# Patient Record
Sex: Male | Born: 1975 | Race: White | Hispanic: No | Marital: Single | Smoking: Never smoker
Health system: Southern US, Community
[De-identification: ages and names within clinical notes are randomized; demographics above are authoritative.]

## PROBLEM LIST (undated history)

## (undated) DIAGNOSIS — Z789 Other specified health status: Secondary | ICD-10-CM

## (undated) DIAGNOSIS — B20 Human immunodeficiency virus [HIV] disease: Secondary | ICD-10-CM

---

## 2014-09-17 ENCOUNTER — Ambulatory Visit (INDEPENDENT_AMBULATORY_CARE_PROVIDER_SITE_OTHER): Payer: Self-pay | Admitting: Family Medicine

## 2014-09-17 VITALS — BP 92/64 | HR 110 | Temp 97.4°F | Resp 20 | Ht 68.25 in | Wt 133.4 lb

## 2014-09-17 DIAGNOSIS — R42 Dizziness and giddiness: Secondary | ICD-10-CM

## 2014-09-17 DIAGNOSIS — R1030 Lower abdominal pain, unspecified: Secondary | ICD-10-CM

## 2014-09-17 DIAGNOSIS — R197 Diarrhea, unspecified: Secondary | ICD-10-CM

## 2014-09-17 DIAGNOSIS — R112 Nausea with vomiting, unspecified: Secondary | ICD-10-CM

## 2014-09-17 LAB — POCT CBC
Granulocyte percent: 63.9 %G (ref 37–80)
HCT, POC: 52 % (ref 43.5–53.7)
Hemoglobin: 16.4 g/dL (ref 14.1–18.1)
Lymph, poc: 2.7 (ref 0.6–3.4)
MCH, POC: 25.2 pg — AB (ref 27–31.2)
MCHC: 31.6 g/dL — AB (ref 31.8–35.4)
MCV: 79.9 fL — AB (ref 80–97)
MID (cbc): 0.6 (ref 0–0.9)
MPV: 8.6 fL (ref 0–99.8)
POC Granulocyte: 6 (ref 2–6.9)
POC LYMPH PERCENT: 29.2 %L (ref 10–50)
POC MID %: 6.9 %M (ref 0–12)
Platelet Count, POC: 308 10*3/uL (ref 142–424)
RBC: 6.51 M/uL — AB (ref 4.69–6.13)
RDW, POC: 16.2 %
WBC: 9.4 10*3/uL (ref 4.6–10.2)

## 2014-09-17 LAB — POCT URINALYSIS DIPSTICK
Glucose, UA: NEGATIVE
Leukocytes, UA: NEGATIVE
Nitrite, UA: NEGATIVE
Protein, UA: 100
Spec Grav, UA: 1.02
Urobilinogen, UA: 0.2
pH, UA: 6

## 2014-09-17 MED ORDER — PROMETHAZINE HCL 12.5 MG PO TABS: 12.5000 mg | ORAL_TABLET | Freq: Three times a day (TID) | ORAL | Status: DC | PRN

## 2014-09-17 MED ORDER — OMEPRAZOLE 20 MG PO CPDR: 20.0000 mg | DELAYED_RELEASE_CAPSULE | Freq: Every day | ORAL | Status: DC

## 2014-09-17 NOTE — Progress Notes (Signed)
° °  Subjective:  This chart was scribed for Elvina SidleKurt Lauenstein, MD, by Elon SpannerGarrett Cook, Medical Scribe. This patient was seen in room Rm and the patient's care was started at 10:53 AM.   Patient ID: Jon Love, male    DOB: 12/28/1975, 39 y.o.   MRN: [id number] Chief Complaint  Patient presents with   Abdominal Pain    x 1-2 weeks   Headache   Emesis    nausea    HPI HPI Comments: Jon Dellntonio Love is a 39 y.o. male who presents to the Emergency Department complaining of lower abdominal pain onset 2 weeks ago.  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  He reports associated nausea and diarrhea onset several days ago that resolved yesterday   Review of Systems  Gastrointestinal: Positive for nausea, abdominal pain and diarrhea.       Objective:   Physical Exam Is a 39 year old gentleman appearing stated age with scaphoid abdomen in no acute distress HEENT: Mild injection of his conjunctiva, otherwise negative Chest: Clear Heart: Regular no murmur Abdomen: Active bowel sounds with no HSM or masses, minimally tender with deep palpation both lower quadrants-worse on the left Extremities: Full range of motion, no edema Skin: No rash  Results for orders placed or performed in visit on 09/17/14  POCT CBC  Result Value Ref Range   WBC 9.4 4.6 - 10.2 K/uL   Lymph, poc 2.7 0.6 - 3.4   POC LYMPH PERCENT 29.2 10 - 50 %L   MID (cbc) 0.6 0 - 0.9   POC MID % 6.9 0 - 12 %M   POC Granulocyte 6.0 2 - 6.9   Granulocyte percent 63.9 37 - 80 %G   RBC 6.51 (A) 4.69 - 6.13 M/uL   Hemoglobin 16.4 14.1 - 18.1 g/dL   HCT, POC 40.952.0 81.143.5 - 53.7 %   MCV 79.9 (A) 80 - 97 fL   MCH, POC 25.2 (A) 27 - 31.2 pg   MCHC 31.6 (A) 31.8 - 35.4 g/dL   RDW, POC 91.416.2 %   Platelet Count, POC 308 142 - 424 K/uL   MPV 8.6 0 - 99.8 fL  POCT urinalysis dipstick  Result Value Ref Range   Color, UA amber    Clarity, UA hazy    Glucose, UA neg     Bilirubin, UA mod    Ketones, UA trace    Spec Grav, UA 1.020    Blood, UA trace    pH, UA 6.0    Protein, UA 100    Urobilinogen, UA 0.2    Nitrite, UA neg    Leukocytes, UA Negative       Assessment & Plan:  11:02 AM Will order labs.  Patient acknowledges and agrees with plan.    This chart was scribed in my presence and reviewed by me personally.    ICD-9-CM ICD-10-CM   1. Nausea vomiting and diarrhea 787.91 R11.2 POCT CBC   787.01 R19.7 POCT urinalysis dipstick     omeprazole (PRILOSEC) 20 MG capsule     promethazine (PHENERGAN) 12.5 MG tablet  2. Dizziness and giddiness 780.4 R42 omeprazole (PRILOSEC) 20 MG capsule  3. Lower abdominal pain 789.09 R10.30 POCT CBC     POCT urinalysis dipstick     omeprazole (PRILOSEC) 20 MG capsule   OOW x 48 hours  Signed, Elvina SidleKurt Lauenstein, MD

## 2014-09-17 NOTE — Patient Instructions (Addendum)
It is very possible that the chemicals used initially upset your stomach and cause some of the symptoms. At this point, there is evidence for a virus causing the nausea vomiting and diarrhea. I think it's reasonable to rest for a week on clear liquids, taking the medicine as directed and then returning to work when you're at full strength    Nuseas y Vmitos (Nausea and Vomiting) La nusea es la sensacin de Dentistmalestar en el estmago o de la necesidad de vomitar. El vmito es un reflejo por el que los contenidos del estmago salen por la boca. El vmito puede ocasionar prdida de lquidos del organismo (deshidratacin). Los nios y los ONEOKadultos mayores pueden deshidratarse rpidamente (en especial si tambin tienen diarrea). Las nuseas y los vmitos son sntoma de un trastorno o enfermedad. Es importante Emergency planning/management officeraveriguar la causa de los sntomas. CAUSAS  Irritacin directa de la membrana que cubre el Hudsonestmago. Esta irritacin puede ser resultado del aumento de la produccin de cido, (reflujo gastroesofgico), infecciones, intoxicacin alimentaria, ciertos medicamentos (como antinflamatorios no esteroideos), consumo de alcohol o de tabaco.  Seales del cerebro.Estas seales pueden ser un dolor de cabeza, exposicin al calor, trastornos del odo interno, aumento de la presin en el cerebro por lesiones, infeccin, un tumor o conmocin cerebral, estmulos emocionales o problemas metablicos.  Una obstruccin en el tracto gastrointestinal (obstruccin intestinal).  Ciertas enfermedades como la diabetes, problemas en la vescula biliar, apendicitis, problemas renales, cncer, sepsis, sntomas atpicos de infarto o trastornos alimentarios.  Tratamientos mdicos como la quimioterapia y la radiacin.  Medicamentos que inducen al sueo (anestesia general) durante Cipriano Mileuna ciruga. DIAGNSTICO  El mdico podr solicitarle algunos anlisis si los problemas no mejoran luego de 26[address] Dimm[address] Roadalgunos das. Tambin podrn pedirle  anlisis si los sntomas son graves o si el motivo de los vmitos o las nuseas no est claro. Los American Electric Poweranlisis pueden ser:   Anlisis de [place].  Anlisis de Begg[place].  Pruebas de materia fecal.  Cultivos (para buscar evidencias de infeccin).  Radiografas u otros estudios por imgenes. Los Norfolk Southernresultados de las pruebas lo ayudarn al mdico a tomar decisiones acerca del mejor curso de tratamiento o la necesidad de Consecoanlisis adicionales.  TRATAMIENTO  Debe estar bien hidratado. Beba con frecuencia pequeas cantidades de lquido.Puede beber agua, bebidas deportivas, caldos claros o comer pequeos trocitos de hielo o gelatina para mantenerse hidratado.Cuando coma, hgalo lentamente para evitar las nuseas.Hay medicamentos para evitar las nuseas que pueden aliviarlo.  INSTRUCCIONES PARA EL CUIDADO DOMICILIARIO  Si su mdico le prescribe medicamentos tmelos como se le haya indicado.  Si no tiene hambre, no se fuerce a comer. Sin embargo, es necesario que tome lquidos.  Si tiene hambre alimntese con una dieta normal, a menos que el mdico le indique otra cosa.  Los mejores alimentos son Neomia Dearuna combinacin de carbohidratos complejos (arroz, trigo, papas, pan), carnes magras, yogur, frutas y Sports administratorvegetales.  Evite los alimentos ricos en grasas porque dificultan la digestin.  Beba gran cantidad de lquido para mantener la orina de tono claro o color amarillo plido.  Si est deshidratado, consulte a su mdico para que le d instrucciones especficas para volver a hidratarlo. Los signos de deshidratacin son:  Franz DellMucha sed.  Labios y boca secos.  Mareos.  Larose Kellsrina oscura.  Disminucin de la frecuencia y cantidad de la [place].  Confusin.  Tiene el pulso o la respiracin acelerados. SOLICITE ATENCIN MDICA DE INMEDIATO SI:  Vomita sangre o algo similar a la borra del caf.  La materia fecal (heces) es negra  o tiene sangre.  Sufre una cefalea grave o rigidez en el cuello.  Se siente  confundido.  Siente dolor abdominal intenso.  Tiene dolor en el pecho o dificultad para respirar.  No orina por 8 horas.  Tiene la piel fra y pegajosa.  Sigue vomitando durante ms de 24 a 48 horas.  Tiene fiebre. ASEGRESE QUE:   Comprende estas instrucciones.  Controlar su enfermedad.  Solicitar ayuda inmediatamente si no mejora o si empeora. Document Released: 06/15/2007 Document Revised: 08/18/2011 [facility] Patient Information 2015 [place], [place]. This information is not intended to replace advice given to you by your health care provider. Make sure you discuss any questions you have with your health care provider.

## 2014-10-06 ENCOUNTER — Emergency Department (HOSPITAL_COMMUNITY): Payer: Self-pay

## 2014-10-06 ENCOUNTER — Encounter (HOSPITAL_COMMUNITY): Payer: Self-pay | Admitting: Emergency Medicine

## 2014-10-06 ENCOUNTER — Inpatient Hospital Stay (HOSPITAL_COMMUNITY)
Admission: EM | Admit: 2014-10-06 | Discharge: 2014-10-13 | DRG: 417 | Disposition: A | Discharge status: Home / Self Care | Payer: Self-pay | Attending: Internal Medicine | Admitting: Internal Medicine

## 2014-10-06 DIAGNOSIS — E43 Unspecified severe protein-calorie malnutrition: Secondary | ICD-10-CM | POA: Diagnosis present

## 2014-10-06 DIAGNOSIS — I959 Hypotension, unspecified: Secondary | ICD-10-CM | POA: Diagnosis not present

## 2014-10-06 DIAGNOSIS — K8050 Calculus of bile duct without cholangitis or cholecystitis without obstruction: Secondary | ICD-10-CM

## 2014-10-06 DIAGNOSIS — N179 Acute kidney failure, unspecified: Secondary | ICD-10-CM | POA: Diagnosis present

## 2014-10-06 DIAGNOSIS — E861 Hypovolemia: Secondary | ICD-10-CM | POA: Diagnosis present

## 2014-10-06 DIAGNOSIS — E872 Acidosis, unspecified: Secondary | ICD-10-CM | POA: Diagnosis present

## 2014-10-06 DIAGNOSIS — Z419 Encounter for procedure for purposes other than remedying health state, unspecified: Secondary | ICD-10-CM

## 2014-10-06 DIAGNOSIS — M109 Gout, unspecified: Secondary | ICD-10-CM | POA: Diagnosis present

## 2014-10-06 DIAGNOSIS — E860 Dehydration: Secondary | ICD-10-CM | POA: Diagnosis present

## 2014-10-06 DIAGNOSIS — Z880 Allergy status to penicillin: Secondary | ICD-10-CM

## 2014-10-06 DIAGNOSIS — R05 Cough: Secondary | ICD-10-CM | POA: Insufficient documentation

## 2014-10-06 DIAGNOSIS — R740 Nonspecific elevation of levels of transaminase and lactic acid dehydrogenase [LDH]: Secondary | ICD-10-CM

## 2014-10-06 DIAGNOSIS — D689 Coagulation defect, unspecified: Secondary | ICD-10-CM | POA: Diagnosis present

## 2014-10-06 DIAGNOSIS — R1011 Right upper quadrant pain: Secondary | ICD-10-CM | POA: Diagnosis present

## 2014-10-06 DIAGNOSIS — R609 Edema, unspecified: Secondary | ICD-10-CM

## 2014-10-06 DIAGNOSIS — R040 Epistaxis: Secondary | ICD-10-CM | POA: Diagnosis present

## 2014-10-06 DIAGNOSIS — Z79899 Other long term (current) drug therapy: Secondary | ICD-10-CM

## 2014-10-06 DIAGNOSIS — R197 Diarrhea, unspecified: Secondary | ICD-10-CM | POA: Diagnosis present

## 2014-10-06 DIAGNOSIS — R935 Abnormal findings on diagnostic imaging of other abdominal regions, including retroperitoneum: Secondary | ICD-10-CM | POA: Diagnosis present

## 2014-10-06 DIAGNOSIS — R6521 Severe sepsis with septic shock: Secondary | ICD-10-CM | POA: Diagnosis not present

## 2014-10-06 DIAGNOSIS — E871 Hypo-osmolality and hyponatremia: Secondary | ICD-10-CM | POA: Diagnosis present

## 2014-10-06 DIAGNOSIS — E876 Hypokalemia: Secondary | ICD-10-CM | POA: Diagnosis present

## 2014-10-06 DIAGNOSIS — A419 Sepsis, unspecified organism: Secondary | ICD-10-CM

## 2014-10-06 DIAGNOSIS — R109 Unspecified abdominal pain: Secondary | ICD-10-CM

## 2014-10-06 DIAGNOSIS — B20 Human immunodeficiency virus [HIV] disease: Secondary | ICD-10-CM | POA: Diagnosis present

## 2014-10-06 DIAGNOSIS — K8067 Calculus of gallbladder and bile duct with acute and chronic cholecystitis with obstruction: Principal | ICD-10-CM | POA: Diagnosis present

## 2014-10-06 DIAGNOSIS — R7401 Elevation of levels of liver transaminase levels: Secondary | ICD-10-CM | POA: Diagnosis present

## 2014-10-06 DIAGNOSIS — R52 Pain, unspecified: Secondary | ICD-10-CM

## 2014-10-06 DIAGNOSIS — R748 Abnormal levels of other serum enzymes: Secondary | ICD-10-CM | POA: Diagnosis present

## 2014-10-06 DIAGNOSIS — K8020 Calculus of gallbladder without cholecystitis without obstruction: Secondary | ICD-10-CM | POA: Diagnosis present

## 2014-10-06 DIAGNOSIS — D649 Anemia, unspecified: Secondary | ICD-10-CM | POA: Diagnosis present

## 2014-10-06 DIAGNOSIS — R059 Cough, unspecified: Secondary | ICD-10-CM | POA: Insufficient documentation

## 2014-10-06 DIAGNOSIS — L539 Erythematous condition, unspecified: Secondary | ICD-10-CM

## 2014-10-06 DIAGNOSIS — K8309 Other cholangitis: Secondary | ICD-10-CM

## 2014-10-06 LAB — URINE MICROSCOPIC-ADD ON

## 2014-10-06 LAB — LACTIC ACID, PLASMA
Lactic Acid, Venous: 0.9 mmol/L (ref 0.5–2.0)
Lactic Acid, Venous: 1.3 mmol/L (ref 0.5–2.0)

## 2014-10-06 LAB — CBC WITH DIFFERENTIAL/PLATELET
Basophils Absolute: 0 10*3/uL (ref 0.0–0.1)
Basophils Relative: 0 % (ref 0–1)
Eosinophils Absolute: 0.4 10*3/uL (ref 0.0–0.7)
Eosinophils Relative: 7 % — ABNORMAL HIGH (ref 0–5)
HCT: 43.4 % (ref 39.0–52.0)
Hemoglobin: 15.5 g/dL (ref 13.0–17.0)
Lymphocytes Relative: 22 % (ref 12–46)
Lymphs Abs: 1.4 10*3/uL (ref 0.7–4.0)
MCH: 26.1 pg (ref 26.0–34.0)
MCHC: 35.7 g/dL (ref 30.0–36.0)
MCV: 73.1 fL — ABNORMAL LOW (ref 78.0–100.0)
Monocytes Absolute: 0.4 10*3/uL (ref 0.1–1.0)
Monocytes Relative: 6 % (ref 3–12)
Neutro Abs: 4.2 10*3/uL (ref 1.7–7.7)
Neutrophils Relative %: 65 % (ref 43–77)
Platelets: 302 10*3/uL (ref 150–400)
RBC: 5.94 MIL/uL — ABNORMAL HIGH (ref 4.22–5.81)
RDW: 14.1 % (ref 11.5–15.5)
WBC: 6.4 10*3/uL (ref 4.0–10.5)

## 2014-10-06 LAB — COMPREHENSIVE METABOLIC PANEL
ALT: 50 U/L (ref 0–53)
AST: 66 U/L — ABNORMAL HIGH (ref 0–37)
Albumin: 3.7 g/dL (ref 3.5–5.2)
Alkaline Phosphatase: 134 U/L — ABNORMAL HIGH (ref 39–117)
Anion gap: 15 (ref 5–15)
BUN: 35 mg/dL — ABNORMAL HIGH (ref 6–23)
CO2: 17 mmol/L — ABNORMAL LOW (ref 19–32)
Calcium: 8.8 mg/dL (ref 8.4–10.5)
Chloride: 98 mmol/L (ref 96–112)
Creatinine, Ser: 1.97 mg/dL — ABNORMAL HIGH (ref 0.50–1.35)
GFR calc Af Amer: 48 mL/min — ABNORMAL LOW (ref >90)
GFR calc non Af Amer: 41 mL/min — ABNORMAL LOW (ref >90)
Glucose, Bld: 154 mg/dL — ABNORMAL HIGH (ref 70–99)
Potassium: 2.5 mmol/L — CL (ref 3.5–5.1)
Sodium: 130 mmol/L — ABNORMAL LOW (ref 135–145)
Total Bilirubin: 1.8 mg/dL — ABNORMAL HIGH (ref 0.3–1.2)
Total Protein: 8.1 g/dL (ref 6.0–8.3)

## 2014-10-06 LAB — URINALYSIS, ROUTINE W REFLEX MICROSCOPIC
Bilirubin Urine: NEGATIVE
Glucose, UA: NEGATIVE mg/dL
Ketones, ur: NEGATIVE mg/dL
Leukocytes, UA: NEGATIVE
Nitrite: NEGATIVE
Protein, ur: NEGATIVE mg/dL
Specific Gravity, Urine: 1.02 (ref 1.005–1.030)
Urobilinogen, UA: 0.2 mg/dL (ref 0.0–1.0)
pH: 6 (ref 5.0–8.0)

## 2014-10-06 LAB — BASIC METABOLIC PANEL
Anion gap: 14 (ref 5–15)
BUN: 32 mg/dL — AB (ref 6–23)
CHLORIDE: 100 mmol/L (ref 96–112)
CO2: 13 mmol/L — ABNORMAL LOW (ref 19–32)
Calcium: 8.2 mg/dL — ABNORMAL LOW (ref 8.4–10.5)
Creatinine, Ser: 1.33 mg/dL (ref 0.50–1.35)
GFR calc Af Amer: 77 mL/min — ABNORMAL LOW (ref >90)
GFR calc non Af Amer: 66 mL/min — ABNORMAL LOW (ref >90)
Glucose, Bld: 149 mg/dL — ABNORMAL HIGH (ref 70–99)
POTASSIUM: 2.7 mmol/L — AB (ref 3.5–5.1)
Sodium: 127 mmol/L — ABNORMAL LOW (ref 135–145)

## 2014-10-06 LAB — LIPASE, BLOOD: Lipase: 36 U/L (ref 11–59)

## 2014-10-06 LAB — MRSA PCR SCREENING: MRSA by PCR: NEGATIVE

## 2014-10-06 MED ORDER — METRONIDAZOLE IN NACL 5-0.79 MG/ML-% IV SOLN
500.0000 mg | Freq: Once | INTRAVENOUS | Status: DC
  Filled 2014-10-06: qty 100

## 2014-10-06 MED ORDER — POTASSIUM CHLORIDE 10 MEQ/100ML IV SOLN
10.0000 meq | INTRAVENOUS | Status: AC
  Administered 2014-10-06 (×3): 10 meq via INTRAVENOUS
  Filled 2014-10-06 (×3): qty 100

## 2014-10-06 MED ORDER — THIAMINE HCL 100 MG/ML IJ SOLN
100.0000 mg | Freq: Every day | INTRAMUSCULAR | Status: DC
  Administered 2014-10-06 – 2014-10-11 (×6): 100 mg via INTRAVENOUS
  Filled 2014-10-06 (×10): qty 1

## 2014-10-06 MED ORDER — ENOXAPARIN SODIUM 40 MG/0.4ML ~~LOC~~ SOLN
40.0000 mg | SUBCUTANEOUS | Status: DC
  Administered 2014-10-06: 40 mg via SUBCUTANEOUS
  Filled 2014-10-06 (×2): qty 0.4

## 2014-10-06 MED ORDER — FENTANYL CITRATE (PF) 100 MCG/2ML IJ SOLN
50.0000 ug | Freq: Once | INTRAMUSCULAR | Status: AC
  Administered 2014-10-06: 50 ug via INTRAVENOUS
  Filled 2014-10-06: qty 2

## 2014-10-06 MED ORDER — SODIUM CHLORIDE 0.9 % IV BOLUS (SEPSIS)
1000.0000 mL | Freq: Once | INTRAVENOUS | Status: AC
  Administered 2014-10-06: 1000 mL via INTRAVENOUS

## 2014-10-06 MED ORDER — ONDANSETRON HCL 4 MG/2ML IJ SOLN: 4.0000 mg | Freq: Four times a day (QID) | INTRAMUSCULAR | Status: DC | PRN

## 2014-10-06 MED ORDER — MORPHINE SULFATE 2 MG/ML IJ SOLN
1.0000 mg | INTRAMUSCULAR | Status: DC | PRN
  Administered 2014-10-09: 1 mg via INTRAVENOUS
  Filled 2014-10-06: qty 1

## 2014-10-06 MED ORDER — ACETAMINOPHEN 650 MG RE SUPP: 650.0000 mg | Freq: Four times a day (QID) | RECTAL | Status: DC | PRN

## 2014-10-06 MED ORDER — IOHEXOL 300 MG/ML  SOLN
25.0000 mL | Freq: Once | INTRAMUSCULAR | Status: AC | PRN
  Administered 2014-10-06: 25 mL via ORAL

## 2014-10-06 MED ORDER — HYDROCODONE-ACETAMINOPHEN 5-325 MG PO TABS
1.0000 | ORAL_TABLET | ORAL | Status: DC | PRN
  Administered 2014-10-09: 1 via ORAL
  Administered 2014-10-10: 2 via ORAL
  Administered 2014-10-10: 1 via ORAL
  Administered 2014-10-10 – 2014-10-11 (×3): 2 via ORAL
  Administered 2014-10-11: 1 via ORAL
  Administered 2014-10-11 – 2014-10-12 (×4): 2 via ORAL
  Filled 2014-10-06 (×2): qty 2
  Filled 2014-10-06 (×2): qty 1
  Filled 2014-10-06 (×7): qty 2

## 2014-10-06 MED ORDER — MAGNESIUM SULFATE IN D5W 10-5 MG/ML-% IV SOLN
1.0000 g | INTRAVENOUS | Status: AC
Start: 2014-10-06 — End: 2014-10-06
  Administered 2014-10-06: 1 g via INTRAVENOUS
  Filled 2014-10-06: qty 100

## 2014-10-06 MED ORDER — FOLIC ACID 5 MG/ML IJ SOLN
1.0000 mg | Freq: Every day | INTRAMUSCULAR | Status: DC
  Administered 2014-10-06 – 2014-10-11 (×5): 1 mg via INTRAVENOUS
  Filled 2014-10-06 (×10): qty 0.2

## 2014-10-06 MED ORDER — POTASSIUM CHLORIDE 10 MEQ/100ML IV SOLN
10.0000 meq | INTRAVENOUS | Status: AC
  Administered 2014-10-06 (×2): 10 meq via INTRAVENOUS
  Filled 2014-10-06 (×3): qty 100

## 2014-10-06 MED ORDER — SODIUM CHLORIDE 0.9 % IV SOLN
INTRAVENOUS | Status: DC
  Administered 2014-10-06: 18:00:00 via INTRAVENOUS
  Administered 2014-10-06: 1000 mL via INTRAVENOUS
  Administered 2014-10-07 (×2): via INTRAVENOUS

## 2014-10-06 MED ORDER — IOHEXOL 300 MG/ML  SOLN
100.0000 mL | Freq: Once | INTRAMUSCULAR | Status: AC | PRN
  Administered 2014-10-06: 100 mL via INTRAVENOUS

## 2014-10-06 MED ORDER — ACETAMINOPHEN 325 MG PO TABS: 650.0000 mg | ORAL_TABLET | Freq: Four times a day (QID) | ORAL | Status: DC | PRN

## 2014-10-06 MED ORDER — ONDANSETRON HCL 4 MG/2ML IJ SOLN
4.0000 mg | Freq: Once | INTRAMUSCULAR | Status: AC
  Administered 2014-10-06: 4 mg via INTRAVENOUS
  Filled 2014-10-06: qty 2

## 2014-10-06 MED ORDER — CIPROFLOXACIN IN D5W 400 MG/200ML IV SOLN
400.0000 mg | Freq: Once | INTRAVENOUS | Status: AC
  Administered 2014-10-06: 400 mg via INTRAVENOUS
  Filled 2014-10-06: qty 200

## 2014-10-06 MED ORDER — METRONIDAZOLE IN NACL 5-0.79 MG/ML-% IV SOLN
500.0000 mg | Freq: Three times a day (TID) | INTRAVENOUS | Status: DC
  Administered 2014-10-06 – 2014-10-08 (×6): 500 mg via INTRAVENOUS
  Filled 2014-10-06 (×6): qty 100

## 2014-10-06 MED ORDER — ONDANSETRON HCL 4 MG PO TABS: 4.0000 mg | ORAL_TABLET | Freq: Four times a day (QID) | ORAL | Status: DC | PRN

## 2014-10-06 MED ORDER — CIPROFLOXACIN IN D5W 400 MG/200ML IV SOLN
400.0000 mg | Freq: Two times a day (BID) | INTRAVENOUS | Status: DC
  Administered 2014-10-07 – 2014-10-08 (×3): 400 mg via INTRAVENOUS
  Filled 2014-10-06 (×4): qty 200

## 2014-10-06 NOTE — ED Provider Notes (Signed)
CSN: [id number]     Arrival date & time 10/06/14  1055 History   First MD Initiated Contact with Patient 10/06/14 1113     Chief Complaint  Patient presents with  . Abdominal Pain  . Headache   Spanish interpreter phone was utilized for this history and physical exam  (Consider location/radiation/quality/duration/timing/severity/associated sxs/prior Treatment) HPI Patient presents to the emergency department with mid abdominal pain that somewhat on the right side of his abdomen.  The patient states that it has been ongoing since the beginning of April.  He states that nausea, vomiting, diarrhea.  Since that time he was seen in urgent care on April 10 and treated for gastroenteritis.  The patient states he did not get any significant relief from their medications.  The patient states that the nausea, vomiting, diarrhea get worse over the last 2 days.  Patient denies chest pain, shortness of breath, weakness, dizziness, headache, blurred vision, back pain, neck pain, cough, rhinorrhea, sore throat, fever, rash, or syncope.  The patient states that nothing seems make his condition better.  Palpation makes the pain worse History reviewed. No pertinent past medical history. History reviewed. No pertinent past surgical history. No family history on file. History  Substance Use Topics  . Smoking status: Never Smoker   . Smokeless tobacco: Not on file  . Alcohol Use: 0.0 oz/week    0 Standard drinks or equivalent per week    Review of Systems  All other systems negative except as documented in the HPI. All pertinent positives and negatives as reviewed in the HPI.  Allergies  Penicillins  Home Medications   Prior to Admission medications   Medication Sig Start Date End Date Taking? Authorizing Provider  Multiple Vitamin (MULTIVITAMIN) tablet Take 1 tablet by mouth daily.   Yes Historical Provider, MD  omeprazole (PRILOSEC) 20 MG capsule Take 1 capsule (20 mg total) by mouth daily. Patient  not taking: Reported on 10/06/2014 09/17/14   Elvina SidleKurt Lauenstein, MD  promethazine (PHENERGAN) 12.5 MG tablet Take 1 tablet (12.5 mg total) by mouth every 8 (eight) hours as needed for nausea or vomiting. Patient not taking: Reported on 10/06/2014 09/17/14   Elvina SidleKurt Lauenstein, MD   BP 98/70 mmHg  Pulse 74  Temp(Src) 97.5 F (36.4 C) (Oral)  Resp 17  SpO2 99% Physical Exam  Constitutional: He is oriented to person, place, and time. He appears well-developed and well-nourished. No distress.  HENT:  Head: Normocephalic and atraumatic.  Mouth/Throat: Oropharynx is clear and moist.  Eyes: Pupils are equal, round, and reactive to light.  Neck: Normal range of motion. Neck supple.  Cardiovascular: Normal rate, regular rhythm and normal heart sounds.  Exam reveals no gallop and no friction rub.   No murmur heard. Pulmonary/Chest: Effort normal and breath sounds normal. No respiratory distress.  Abdominal: Soft. Bowel sounds are normal. He exhibits no distension. There is tenderness. There is no rebound and no guarding.  Neurological: He is alert and oriented to person, place, and time. He exhibits normal muscle tone. Coordination normal.  Skin: Skin is warm and dry. No rash noted. No erythema.  Psychiatric: He has a normal mood and affect. His behavior is normal.  Nursing note and vitals reviewed.   ED Course  Procedures (including critical care time) Labs Review Labs Reviewed  COMPREHENSIVE METABOLIC PANEL - Abnormal; Notable for the following:    Sodium 130 (*)    Potassium 2.5 (*)    CO2 17 (*)    Glucose, Bld 154 (*)  BUN 35 (*)    Creatinine, Ser 1.97 (*)    AST 66 (*)    Alkaline Phosphatase 134 (*)    Total Bilirubin 1.8 (*)    GFR calc non Af Amer 41 (*)    GFR calc Af Amer 48 (*)    All other components within normal limits  CBC WITH DIFFERENTIAL/PLATELET - Abnormal; Notable for the following:    RBC 5.94 (*)    MCV 73.1 (*)    Eosinophils Relative 7 (*)    All other  components within normal limits  URINALYSIS, ROUTINE W REFLEX MICROSCOPIC - Abnormal; Notable for the following:    Hgb urine dipstick TRACE (*)    All other components within normal limits  URINE MICROSCOPIC-ADD ON - Abnormal; Notable for the following:    Bacteria, UA FEW (*)    All other components within normal limits  CULTURE, BLOOD (ROUTINE X 2)  CULTURE, BLOOD (ROUTINE X 2)  LIPASE, BLOOD  LACTIC ACID, PLASMA  LACTIC ACID, PLASMA    Imaging Review Ct Abdomen Pelvis W Contrast  10/06/2014   CLINICAL DATA:  Lower abdominal pain for 2 weeks. Nausea and diarrhea. Headache.  EXAM: CT ABDOMEN AND PELVIS WITH CONTRAST  TECHNIQUE: Multidetector CT imaging of the abdomen and pelvis was performed using the standard protocol following bolus administration of intravenous contrast.  CONTRAST:  OMNIPAQUE IOHEXOL 300 MG/ML  SOLN  COMPARISON:  None.  FINDINGS: Visualized lung bases are clear allowing for mild motion artifact. No pleural effusion.  There is mild, diffuse intrahepatic biliary dilatation. There is also mild extrahepatic biliary dilatation, with the common bile duct measuring 9 mm in diameter. There is suggestion of a small amount of soft tissue density in the distal aspect of the common bile duct (series 2, images 37 and 38), and a similar 10 mm focus is present dependently in the gallbladder, possibly reflecting gallstones. Gallbladder is moderately distended without gross wall thickening. Cystic duct also appears mildly to moderately distended. There is no pancreatic ductal dilatation, and no gross pancreatic or ampullary mass is identified.  3 mm hypodensity is noted in the right hepatic dome. The spleen, adrenal glands, right kidney, and pancreas have an unremarkable enhanced appearance. 3 mm hypodensity in the upper pole of the left kidney is too small to fully characterize but likely represents a cyst.  Oral contrast is present throughout the small bowel. There is no evidence of  bowel obstruction. Liquid stool is present throughout the colon, consistent with history of recent diarrhea. Appendix is identified in the right lower quadrant/ upper pelvis and is unremarkable.  Bladder is unremarkable. No free fluid or enlarged lymph nodes are identified. No acute osseous abnormality is seen.  IMPRESSION: Mild diffuse intrahepatic and extrahepatic biliary dilatation with possible choledocholithiasis and cholelithiasis. Consider further evaluation with ERCP or MRCP. Other considerations might include a biliary stricture or cholangitis.   Electronically Signed   By: Sebastian Ache   On: 10/06/2014 13:09   I spoke with gastroenterology, Dr. Elnoria Howard the [facility] and general surgery.  They have all been down to evaluate the patient.  The patient will be admitted to the hospital.  He has been given IV fluids and pain medicines here in the emergency department  MDM   Final diagnoses:  RUQ pain       Charlestine Night, PA-C 10/06/14 1623  Pricilla Loveless, MD 10/06/14 1658  Pricilla Loveless, MD 10/06/14 1739

## 2014-10-06 NOTE — ED Notes (Signed)
Paged [facility] RN-Christina [phone]

## 2014-10-06 NOTE — Progress Notes (Signed)
ANTIBIOTIC CONSULT NOTE - INITIAL  Pharmacy Consult for Cipro Indication: intra-abdominal infection  Allergies  Allergen Reactions  . Penicillins     "hives"    Patient Measurements:   Adjusted Body Weight:   Vital Signs: Temp: 97.5 F (36.4 C) (04/29 1128) Temp Source: Oral (04/29 1128) BP: 96/69 mmHg (04/29 1607) Pulse Rate: 77 (04/29 1607) Intake/Output from previous day:   Intake/Output from this shift:    Labs:  Recent Labs  10/06/14 1159  WBC 6.4  HGB 15.5  PLT 302  CREATININE 1.97*   CrCl cannot be calculated (Unknown ideal weight.). No results for input(s): VANCOTROUGH, VANCOPEAK, VANCORANDOM, GENTTROUGH, GENTPEAK, GENTRANDOM, TOBRATROUGH, TOBRAPEAK, TOBRARND, AMIKACINPEAK, AMIKACINTROU, AMIKACIN in the last 72 hours.   Microbiology: No results found for this or any previous visit (from the past 720 hour(s)).  Medical History: History reviewed. No pertinent past medical history.  Medications:   (Not in a hospital admission) Scheduled:   Infusions:  . metronidazole 500 mg (10/06/14 1654)  . potassium chloride 10 mEq (10/06/14 1652)   Assessment: 39yo male with no significant medical history presents with headache and abdominal pain. Pharmacy is consulted to dose cipro for intra-abdominal infection. Pt was started on flagyl 500mg  q8h. Pt is afebrile, WBC 6.4, sCr 1.97.  Goal of Therapy:  Eradication of infection  Plan:  Cipro 400mg  IV q12h Continue flagyl 500mg  q8h Follow up culture results, renal function, and clinical course  Arlean HoppingCorey M. Newman PiesBall, PharmD Clinical Pharmacist Pager [phone] 10/06/2014,4:51 PM

## 2014-10-06 NOTE — ED Notes (Addendum)
[phone] - Jon Love

## 2014-10-06 NOTE — Progress Notes (Addendum)
Pt came to floor from ED. Pt given CHG bath and swabbed for MRSA. Pt is placed on contact for C-Diff for a history of having diarrhea for one month prior to coming to hospital.  Pt speaks spanish and small amount of english. Called central telemetry to notify. Pt came from ED at this time.

## 2014-10-06 NOTE — ED Notes (Signed)
Potassium 2.7 Critical value notification.

## 2014-10-06 NOTE — ED Notes (Signed)
2.5 potassium. Critical lab value notifiaction. Dr. Criss Alvinegoldston notified.

## 2014-10-06 NOTE — H&P (Addendum)
[facility] History and Physical  Mouhamadou Love MRN:[id number] DOB: 06/19/1975 DOA: 10/06/2014  Referring physician: Lawyer PA.  PCP: No PCP Per Patient   Chief Complaint: diarrhea, abdominal pain.   HPI: Jon Love is a 39 y.o. male with no significant PMH who presents complaining of persistent abdominal pain, nausea, vomiting and diarrhea. He was seen at urgent care on 4-10 for the same. He was prescribe phenergan and Protonix with no improvement. He presents today due persistent and worsening abdominal pain. He report more than 7 watery BM. Also report multiple episodes of vomiting, liquid content. He relates generalized , and RUQ abdominal pain. He report pain is worse with melas. Also diarrhea get worse every time he eats.  Evaluation in the ED; k at 2.5, cr at 1.9, LFT elevated, CT abdomen: Mild diffuse intrahepatic and extrahepatic biliary dilatation with possible choledocholithiasis and cholelithiasis. Consider further evaluation with ERCP or MRCP. Other considerations might include a biliary stricture or cholangitis. He received IV bolus and IV potassium.   Review of Systems:  Negative, except as per HPI.    History reviewed. No past medical history. History reviewed. No past surgical history. Social History:  reports that he has never smoked. He does not have any smokeless tobacco history on file. He reports that he drinks alcohol. His drug history is not on file.  Allergies  Allergen Reactions  . Penicillins     "hives"    Family History; mother history of stomach cancer.   Prior to Admission medications   Medication Sig Start Date End Date Taking? Authorizing Provider  Multiple Vitamin (MULTIVITAMIN) tablet Take 1 tablet by mouth daily.   Yes Historical Provider, MD  omeprazole (PRILOSEC) 20 MG capsule Take 1 capsule (20 mg total) by mouth daily. Patient not taking: Reported on 10/06/2014 09/17/14   Kurt Lauenstein, MD  promethazine (PHENERGAN) 12.5 MG  tablet Take 1 tablet (12.5 mg total) by mouth every 8 (eight) hours as needed for nausea or vomiting. Patient not taking: Reported on 10/06/2014 09/17/14   Kurt Lauenstein, MD   Physical Exam: Filed Vitals:   10/06/14 1445 10/06/14 1500 10/06/14 1530 10/06/14 1607  BP: 98/75 98/72 98/70 96/69  Pulse: 79 85 74 77  Temp:      TempSrc:      Resp: 14 29 17 18  SpO2: 98% 97% 99% 98%    Wt Readings from Last 3 Encounters:  09/17/14 60.499 kg (133 lb 6 oz)    General:  Appears calm and comfortable Eyes: PERRL, normal lids, irises & conjunctiva ENT: grossly normal hearing, lips & tongue Neck: no LAD, masses or thyromegaly Cardiovascular: RRR, no m/r/g. No LE edema. Telemetry: SR, no arrhythmias  Respiratory: CTA bilaterally, no w/r/r. Normal respiratory effort. Abdomen: soft, right upper quadrant tenderness.  Skin: no rash or induration seen on limited exam Musculoskeletal: grossly normal tone BUE/BLE Psychiatric: grossly normal mood and affect, speech fluent and appropriate Neurologic: grossly non-focal.          Labs on Admission:  Basic Metabolic Panel:  Recent Labs Lab 10/06/14 1159  NA 130*  K 2.5*  CL 98  CO2 17*  GLUCOSE 154*  BUN 35*  CREATININE 1.97*  CALCIUM 8.8   Liver Function Tests:  Recent Labs Lab 10/06/14 1159  AST 66*  ALT 50  ALKPHOS 134*  BILITOT 1.8*  PROT 8.1  ALBUMIN 3.7    Recent Labs Lab 10/06/14 1159  LIPASE 36   No results for input(s): AMMONIA in the last   168 hours. CBC:  Recent Labs Lab 10/06/14 1159  WBC 6.4  NEUTROABS 4.2  HGB 15.5  HCT 43.4  MCV 73.1*  PLT 302   Cardiac Enzymes: No results for input(s): CKTOTAL, CKMB, CKMBINDEX, TROPONINI in the last 168 hours.  BNP (last 3 results) No results for input(s): BNP in the last 8760 hours.  ProBNP (last 3 results) No results for input(s): PROBNP in the last 8760 hours.  CBG: No results for input(s): GLUCAP in the last 168 hours.  Radiological Exams on  Admission: Ct Abdomen Pelvis W Contrast  10/06/2014   CLINICAL DATA:  Lower abdominal pain for 2 weeks. Nausea and diarrhea. Headache.  EXAM: CT ABDOMEN AND PELVIS WITH CONTRAST  TECHNIQUE: Multidetector CT imaging of the abdomen and pelvis was performed using the standard protocol following bolus administration of intravenous contrast.  CONTRAST:  164m OMNIPAQUE IOHEXOL 300 MG/ML  SOLN  COMPARISON:  None.  FINDINGS: Visualized lung bases are clear allowing for mild motion artifact. No pleural effusion.  There is mild, diffuse intrahepatic biliary dilatation. There is also mild extrahepatic biliary dilatation, with the common bile duct measuring 9 mm in diameter. There is suggestion of a small amount of soft tissue density in the distal aspect of the common bile duct (series 2, images 37 and 38), and a similar 10 mm focus is present dependently in the gallbladder, possibly reflecting gallstones. Gallbladder is moderately distended without gross wall thickening. Cystic duct also appears mildly to moderately distended. There is no pancreatic ductal dilatation, and no gross pancreatic or ampullary mass is identified.  3 mm hypodensity is noted in the right hepatic dome. The spleen, adrenal glands, right kidney, and pancreas have an unremarkable enhanced appearance. 3 mm hypodensity in the upper pole of the left kidney is too small to fully characterize but likely represents a cyst.  Oral contrast is present throughout the small bowel. There is no evidence of bowel obstruction. Liquid stool is present throughout the colon, consistent with history of recent diarrhea. Appendix is identified in the right lower quadrant/ upper pelvis and is unremarkable.  Bladder is unremarkable. No free fluid or enlarged lymph nodes are identified. No acute osseous abnormality is seen.  IMPRESSION: Mild diffuse intrahepatic and extrahepatic biliary dilatation with possible choledocholithiasis and cholelithiasis. Consider further  evaluation with ERCP or MRCP. Other considerations might include a biliary stricture or cholangitis.   Electronically Signed   By: ALogan Bores  On: 10/06/2014 13:09    EKG: I will order EKG.   Assessment/Plan Active Problems:   Hypokalemia   Acute renal failure   Abnormal transaminases   RUQ pain  1-Abdominal Pain: transaminases.  CT scan showed: intrahepatic and extra hepatic biliary dilation with possible choledocholithiasis and cholelithiasis. Others consideration includes biliary stricture or cholangitis.  GI and surgery consulted.  Continue with IV fluids. IV antibiotics to cover for intra-abdominal infection.  MRCP  Check for C diff, stool culture.   2-Hypokalemia; received 3 runs. Will repeat B-met. Careful with correction in setting of renal failure.   3-Acute Renal failure; likely related to hypovolemia, hypotension in setting of diarrhea and vomiting.  IV fluids. Strict I and O.   4-Hypotension:  In setting of dehydration. Check lactic acid. IV fluids.   5-Screening for HIV.  6-Hyponatremia, metabolic acidosis.  IV fluids for now. Hold on start bicarb gtt due to hypokalemia.   Code Status: full code.  DVT Prophylaxis:lovenox.  Family Communication: care discussed with patient.  Disposition Plan: expect 3  to 4 days inpatient.   Time spent: 35 minutes.   Regalado, Belkys A [facility] Pager [phone]   

## 2014-10-06 NOTE — ED Notes (Signed)
Dr. Sunnie Nielsenegalado notified of pt potassium, MD will order more runs of potassium.

## 2014-10-06 NOTE — ED Notes (Signed)
Per patient and translator phone, pt stated hes been having abodminal pain and nausea/vomiting. Was seen by his doctor for it, was told he had a virus. Pt states he was given medication and it is not helping. Pt is AAOX4, in NAD.

## 2014-10-06 NOTE — Consult Note (Signed)
Unassigned Consult  Reason for Consult: ? Choledocholithiasis Referring Physician: [facility] HPI: This is a 39 year old Spanish speaking male admitted for cholelithiasis and possible choledocholithiasis.  It is reported that he had nausea and vomiting.  He was initially seen by his PCP and told that he had a virus, however, his symptoms continued to persist.  He presents to the ER with these complaints.  I was able to obtain from him that his pain is minor at this time.  History reviewed. No pertinent past medical history.  History reviewed. No pertinent past surgical history.  No family history on file.  Social History:  reports that he has never smoked. He does not have any smokeless tobacco history on file. He reports that he drinks alcohol. His drug history is not on file.  Allergies:  Allergies  Allergen Reactions  . Penicillins     "hives"    Medications:  Scheduled:  Continuous: . ciprofloxacin    . metronidazole    . potassium chloride Stopped (10/06/14 1500)    Results for orders placed or performed during the hospital encounter of 10/06/14 (from the past 24 hour(s))  Comprehensive metabolic panel     Status: Abnormal   Collection Time: 10/06/14 11:59 AM  Result Value Ref Range   Sodium 130 (L) 135 - 145 mmol/L   Potassium 2.5 (LL) 3.5 - 5.1 mmol/L   Chloride 98 96 - 112 mmol/L   CO2 17 (L) 19 - 32 mmol/L   Glucose, Bld 154 (H) 70 - 99 mg/dL   BUN 35 (H) 6 - 23 mg/dL   Creatinine, Ser 1.61 (H) 0.50 - 1.35 mg/dL   Calcium 8.8 8.4 - 09.6 mg/dL   Total Protein 8.1 6.0 - 8.3 g/dL   Albumin 3.7 3.5 - 5.2 g/dL   AST 66 (H) 0 - 37 U/L   ALT 50 0 - 53 U/L   Alkaline Phosphatase 134 (H) 39 - 117 U/L   Total Bilirubin 1.8 (H) 0.3 - 1.2 mg/dL   GFR calc non Af Amer 41 (L) >90 mL/min   GFR calc Af Amer 48 (L) >90 mL/min   Anion gap 15 5 - 15  Lipase, blood     Status: None   Collection Time: 10/06/14 11:59 AM  Result Value Ref Range   Lipase 36 11 - 59 U/L  CBC with Differential     Status: Abnormal   Collection Time: 10/06/14 11:59 AM  Result Value Ref Range   WBC 6.4 4.0 - 10.5 K/uL   RBC 5.94 (H) 4.22 - 5.81 MIL/uL   Hemoglobin 15.5 13.0 - 17.0 g/dL   HCT 04.5 40.9 - 81.1 %   MCV 73.1 (L) 78.0 - 100.0 fL   MCH 26.1 26.0 - 34.0 pg   MCHC 35.7 30.0 - 36.0 g/dL   RDW 91.4 78.2 - 95.6 %   Platelets 302 150 - 400 K/uL   Neutrophils Relative % 65 43 - 77 %   Lymphocytes Relative 22 12 - 46 %   Monocytes Relative 6 3 - 12 %   Eosinophils Relative 7 (H) 0 - 5 %   Basophils Relative 0 0 - 1 %   Neutro Abs 4.2 1.7 - 7.7 K/uL   Lymphs Abs 1.4 0.7 - 4.0 K/uL   Monocytes Absolute 0.4 0.1 - 1.0 K/uL   Eosinophils Absolute 0.4 0.0 - 0.7 K/uL   Basophils Absolute 0.0 0.0 - 0.1 K/uL   Smear Review MORPHOLOGY  UNREMARKABLE   Urinalysis, Routine w reflex microscopic     Status: Abnormal   Collection Time: 10/06/14  1:05 PM  Result Value Ref Range   Color, Urine YELLOW YELLOW   APPearance CLEAR CLEAR   Specific Gravity, Urine 1.020 1.005 - 1.030   pH 6.0 5.0 - 8.0   Glucose, UA NEGATIVE NEGATIVE mg/dL   Hgb urine dipstick TRACE (A) NEGATIVE   Bilirubin Urine NEGATIVE NEGATIVE   Ketones, ur NEGATIVE NEGATIVE mg/dL   Protein, ur NEGATIVE NEGATIVE mg/dL   Urobilinogen, UA 0.2 0.0 - 1.0 mg/dL   Nitrite NEGATIVE NEGATIVE   Leukocytes, UA NEGATIVE NEGATIVE  Urine microscopic-add on     Status: Abnormal   Collection Time: 10/06/14  1:05 PM  Result Value Ref Range   Squamous Epithelial / LPF RARE RARE   WBC, UA 0-2 <3 WBC/hpf   RBC / HPF 0-2 <3 RBC/hpf   Bacteria, UA FEW (A) RARE     Ct Abdomen Pelvis W Contrast  10/06/2014   CLINICAL DATA:  Lower abdominal pain for 2 weeks. Nausea and diarrhea. Headache.  EXAM: CT ABDOMEN AND PELVIS WITH CONTRAST  TECHNIQUE: Multidetector CT imaging of the abdomen and pelvis was performed using the standard protocol following bolus administration of intravenous contrast.  CONTRAST:   OMNIPAQUE IOHEXOL 300 MG/ML  SOLN  COMPARISON:  None.  FINDINGS: Visualized lung bases are clear allowing for mild motion artifact. No pleural effusion.  There is mild, diffuse intrahepatic biliary dilatation. There is also mild extrahepatic biliary dilatation, with the common bile duct measuring 9 mm in diameter. There is suggestion of a small amount of soft tissue density in the distal aspect of the common bile duct (series 2, images 37 and 38), and a similar 10 mm focus is present dependently in the gallbladder, possibly reflecting gallstones. Gallbladder is moderately distended without gross wall thickening. Cystic duct also appears mildly to moderately distended. There is no pancreatic ductal dilatation, and no gross pancreatic or ampullary mass is identified.  3 mm hypodensity is noted in the right hepatic dome. The spleen, adrenal glands, right kidney, and pancreas have an unremarkable enhanced appearance. 3 mm hypodensity in the upper pole of the left kidney is too small to fully characterize but likely represents a cyst.  Oral contrast is present throughout the small bowel. There is no evidence of bowel obstruction. Liquid stool is present throughout the colon, consistent with history of recent diarrhea. Appendix is identified in the right lower quadrant/ upper pelvis and is unremarkable.  Bladder is unremarkable. No free fluid or enlarged lymph nodes are identified. No acute osseous abnormality is seen.  IMPRESSION: Mild diffuse intrahepatic and extrahepatic biliary dilatation with possible choledocholithiasis and cholelithiasis. Consider further evaluation with ERCP or MRCP. Other considerations might include a biliary stricture or cholangitis.   Electronically Signed   By: Sebastian Ache   On: 10/06/2014 13:09    ROS:  As stated above in the HPI otherwise negative.  Blood pressure 98/75, pulse 79, temperature 97.5 F (36.4 C), temperature source Oral, resp. rate 14, SpO2 98 %.    PE: Gen: NAD,  Alert and Oriented HEENT:  [place]/AT, EOMI Neck: Supple, no LAD Lungs: CTA Bilaterally CV: RRR without M/G/R ABM: Soft, NTND, +BS Ext: No C/C/E  Assessment/Plan: 1) Cholelithiasis. 2) ? Choledocholithiasis. 3) Abnormal CT scan. 4) Hypokalemia.   The patient may have passed a stone.  He does not have significant pain at this time and his liver enzymes are not  significantly elevated.  The TB is increased at 1.8.  I do not know the amount of pain he was experiencing before his arrival to the ER.  At this juncture, it will be best to pursue a MRCP.  Additionally, he is hypokalemic and this needs to be corrected before any general anesthetic can be administered if an ERCP is necessary.  Plan: 1) MRCP. 2) Correct potassium.  Jon Love D 10/06/2014, 3:35 PM

## 2014-10-06 NOTE — Consult Note (Signed)
[facility] Surgery Consult Note  Jon Love May 22, 1976  [id number].    Requesting MD: Dr. Regenia Skeeter Chief Complaint/Reason for Consult: Abdominal pain, N/V  HPI:  Obtained through spanish interpretor phone:  39 y/o Hispanic male present to [facility] with complaints of upper and lower abdominal pain and nausea, vomiting, diarrhea for the last 2 days, but the symptoms originated around 09/17/14.  He was seen in the [facility] urgent care secondary to lower abdominal pain and N/D.  His vomiting has since stopped on 09/20/14, but his friend brought him to the ED secondary to worsening diarrhea.   The watery diarrhea is constant, he tried to drink water, but it persists.  He denies being on any antibiotics.  He denies being exposed to anyone sick with similar symptoms.  He lives in a trailer with a friend.  The pain is a burning pain diffusely over his abdomen.  The pain has improved since arriving to the ER.  Eating makes the pain worse.  The pain is relieved by not eating.  If he does eat he throws it up and has diarrhea within 10-15 minutes.  Does admit to SOB/strugling to breathe and nose bleeds from vomiting so much.  No fever/chills, no CP, trouble urinating, dark urine.  No blood in stools/vomiting.    He notes his mother had some type of "stomach cancer" he is unsure if its stomach, gallbladder or liver.  He denies any medical problems.  He has never had any surgery.  Denies smoking.  Drinks 12-15 beers every weekend.  Denies illegal drug use.  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     ROS: All systems reviewed and otherwise negative except for as above  No family history on file.  History reviewed. No pertinent past medical history.  History reviewed. No pertinent past surgical history.  Social History:  reports that he has never smoked. He does not have any smokeless tobacco history on file. He reports that he drinks alcohol. His drug history is not on file.  Allergies:  Allergies   Allergen Reactions  . Penicillins     "hives"     (Not in a hospital admission)  Blood pressure 98/70, pulse 74, temperature 97.5 F (36.4 C), temperature source Oral, resp. rate 17, SpO2 99 %. Physical Exam: General: pleasant, WD/WN Hispanic male who is laying in bed in NAD HEENT: head is normocephalic, atraumatic.  Sclera are injected, with terigyium b/l, no scleral icterus.  PERRL.  Ears and nose without any masses or lesions.  Mouth is pink and moist Heart: regular, rate, and rhythm.  No obvious murmurs, gallops, or rubs noted.  Palpable pedal pulses bilaterally Lungs: CTAB, no wheezes, rhonchi, or rales noted.  Respiratory effort nonlabored Abd: soft, ND, moderately tender in the epigastrium and mild diffusely, +BS, no masses, hernias, or organomegaly, no abdominal scars noted MS: all 4 extremities are symmetrical with no cyanosis, clubbing, or edema. Skin: warm and dry with no masses, lesions, or rashes Psych: A&Ox3 with an appropriate affect.   Results for orders placed or performed during the hospital encounter of 10/06/14 (from the past 48 hour(s))  Comprehensive metabolic panel     Status: Abnormal   Collection Time: 10/06/14 11:59 AM  Result Value Ref Range   Sodium 130 (L) 135 - 145 mmol/L   Potassium 2.5 (LL) 3.5 - 5.1 mmol/L    Comment: CRITICAL RESULT CALLED TO, READ BACK BY AND VERIFIED WITH: GOSS C RN 10/06/14 1314 COSTELLO B REPEATED TO VERIFY  Chloride 98 96 - 112 mmol/L   CO2 17 (L) 19 - 32 mmol/L   Glucose, Bld 154 (H) 70 - 99 mg/dL   BUN 35 (H) 6 - 23 mg/dL   Creatinine, Ser 1.97 (H) 0.50 - 1.35 mg/dL   Calcium 8.8 8.4 - 10.5 mg/dL   Total Protein 8.1 6.0 - 8.3 g/dL   Albumin 3.7 3.5 - 5.2 g/dL   AST 66 (H) 0 - 37 U/L   ALT 50 0 - 53 U/L   Alkaline Phosphatase 134 (H) 39 - 117 U/L   Total Bilirubin 1.8 (H) 0.3 - 1.2 mg/dL   GFR calc non Af Amer 41 (L) >90 mL/min   GFR calc Af Amer 48 (L) >90 mL/min    Comment: (NOTE) The eGFR has been calculated  using the CKD EPI equation. This calculation has not been validated in all clinical situations. eGFR's persistently <90 mL/min signify possible Chronic Kidney Disease.    Anion gap 15 5 - 15  Lipase, blood     Status: None   Collection Time: 10/06/14 11:59 AM  Result Value Ref Range   Lipase 36 11 - 59 U/L  CBC with Differential     Status: Abnormal   Collection Time: 10/06/14 11:59 AM  Result Value Ref Range   WBC 6.4 4.0 - 10.5 K/uL   RBC 5.94 (H) 4.22 - 5.81 MIL/uL   Hemoglobin 15.5 13.0 - 17.0 g/dL   HCT 43.4 39.0 - 52.0 %   MCV 73.1 (L) 78.0 - 100.0 fL   MCH 26.1 26.0 - 34.0 pg   MCHC 35.7 30.0 - 36.0 g/dL   RDW 14.1 11.5 - 15.5 %   Platelets 302 150 - 400 K/uL   Neutrophils Relative % 65 43 - 77 %   Lymphocytes Relative 22 12 - 46 %   Monocytes Relative 6 3 - 12 %   Eosinophils Relative 7 (H) 0 - 5 %   Basophils Relative 0 0 - 1 %   Neutro Abs 4.2 1.7 - 7.7 K/uL   Lymphs Abs 1.4 0.7 - 4.0 K/uL   Monocytes Absolute 0.4 0.1 - 1.0 K/uL   Eosinophils Absolute 0.4 0.0 - 0.7 K/uL   Basophils Absolute 0.0 0.0 - 0.1 K/uL   Smear Review MORPHOLOGY UNREMARKABLE   Urinalysis, Routine w reflex microscopic     Status: Abnormal   Collection Time: 10/06/14  1:05 PM  Result Value Ref Range   Color, Urine YELLOW YELLOW   APPearance CLEAR CLEAR   Specific Gravity, Urine 1.020 1.005 - 1.030   pH 6.0 5.0 - 8.0   Glucose, UA NEGATIVE NEGATIVE mg/dL   Hgb urine dipstick TRACE (A) NEGATIVE   Bilirubin Urine NEGATIVE NEGATIVE   Ketones, ur NEGATIVE NEGATIVE mg/dL   Protein, ur NEGATIVE NEGATIVE mg/dL   Urobilinogen, UA 0.2 0.0 - 1.0 mg/dL   Nitrite NEGATIVE NEGATIVE   Leukocytes, UA NEGATIVE NEGATIVE  Urine microscopic-add on     Status: Abnormal   Collection Time: 10/06/14  1:05 PM  Result Value Ref Range   Squamous Epithelial / LPF RARE RARE   WBC, UA 0-2 <3 WBC/hpf   RBC / HPF 0-2 <3 RBC/hpf   Bacteria, UA FEW (A) RARE   Ct Abdomen Pelvis W Contrast  10/06/2014   CLINICAL  DATA:  Lower abdominal pain for 2 weeks. Nausea and diarrhea. Headache.  EXAM: CT ABDOMEN AND PELVIS WITH CONTRAST  TECHNIQUE: Multidetector CT imaging of the abdomen and pelvis was performed  using the standard protocol following bolus administration of intravenous contrast.  CONTRAST:  129m OMNIPAQUE IOHEXOL 300 MG/ML  SOLN  COMPARISON:  None.  FINDINGS: Visualized lung bases are clear allowing for mild motion artifact. No pleural effusion.  There is mild, diffuse intrahepatic biliary dilatation. There is also mild extrahepatic biliary dilatation, with the common bile duct measuring 9 mm in diameter. There is suggestion of a small amount of soft tissue density in the distal aspect of the common bile duct (series 2, images 37 and 38), and a similar 10 mm focus is present dependently in the gallbladder, possibly reflecting gallstones. Gallbladder is moderately distended without gross wall thickening. Cystic duct also appears mildly to moderately distended. There is no pancreatic ductal dilatation, and no gross pancreatic or ampullary mass is identified.  3 mm hypodensity is noted in the right hepatic dome. The spleen, adrenal glands, right kidney, and pancreas have an unremarkable enhanced appearance. 3 mm hypodensity in the upper pole of the left kidney is too small to fully characterize but likely represents a cyst.  Oral contrast is present throughout the small bowel. There is no evidence of bowel obstruction. Liquid stool is present throughout the colon, consistent with history of recent diarrhea. Appendix is identified in the right lower quadrant/ upper pelvis and is unremarkable.  Bladder is unremarkable. No free fluid or enlarged lymph nodes are identified. No acute osseous abnormality is seen.  IMPRESSION: Mild diffuse intrahepatic and extrahepatic biliary dilatation with possible choledocholithiasis and cholelithiasis. Consider further evaluation with ERCP or MRCP. Other considerations might include a  biliary stricture or cholangitis.   Electronically Signed   By: ALogan Bores  On: 10/06/2014 13:09      Assessment/Plan Nausea/vomting Diarrhea -Needs to be checked for c.diff Epigastric and diffuse abdominal pain ?Cholelithiasis and/or choledocholithiasis Intrahepatic/extrahepatic biliary duct dilatation ? Biliary stricture or cholangitis -Admitted to medicine, GI following -MRCP pending, await results, may need ultrasound -NPO, bowel rest, IVF, pain control, antiemetics -Needs to be further worked up by GI.  He does not have acute cholecystitis at this time.  May not require lap chole this admission.     DCoralie Keens PMethodist [facility] 10/06/2014, 3:53 PM Pager: [phone]  Will allow GI to complete their work-up.  No acute surgical indications.  Call [place] if any surgical indications arise.  MImogene Burn TGeorgette Dover MD, FLat[facility] HospitalSur[facility]  General/ Trauma Surgery  10/06/2014 4:30 PM

## 2014-10-07 ENCOUNTER — Inpatient Hospital Stay (HOSPITAL_COMMUNITY): Payer: MEDICAID

## 2014-10-07 ENCOUNTER — Inpatient Hospital Stay (HOSPITAL_COMMUNITY): Payer: Self-pay

## 2014-10-07 DIAGNOSIS — E871 Hypo-osmolality and hyponatremia: Secondary | ICD-10-CM | POA: Diagnosis present

## 2014-10-07 DIAGNOSIS — E43 Unspecified severe protein-calorie malnutrition: Secondary | ICD-10-CM | POA: Diagnosis present

## 2014-10-07 DIAGNOSIS — E872 Acidosis, unspecified: Secondary | ICD-10-CM | POA: Diagnosis present

## 2014-10-07 DIAGNOSIS — E876 Hypokalemia: Secondary | ICD-10-CM

## 2014-10-07 DIAGNOSIS — R935 Abnormal findings on diagnostic imaging of other abdominal regions, including retroperitoneum: Secondary | ICD-10-CM

## 2014-10-07 DIAGNOSIS — K8020 Calculus of gallbladder without cholecystitis without obstruction: Secondary | ICD-10-CM | POA: Diagnosis present

## 2014-10-07 DIAGNOSIS — R197 Diarrhea, unspecified: Secondary | ICD-10-CM

## 2014-10-07 DIAGNOSIS — K8050 Calculus of bile duct without cholangitis or cholecystitis without obstruction: Secondary | ICD-10-CM | POA: Diagnosis present

## 2014-10-07 LAB — COMPREHENSIVE METABOLIC PANEL
ALT: 35 U/L (ref 0–53)
AST: 47 U/L — ABNORMAL HIGH (ref 0–37)
Albumin: 2.5 g/dL — ABNORMAL LOW (ref 3.5–5.2)
Alkaline Phosphatase: 89 U/L (ref 39–117)
Anion gap: 11 (ref 5–15)
BUN: 26 mg/dL — AB (ref 6–23)
CALCIUM: 8 mg/dL — AB (ref 8.4–10.5)
CO2: 12 mmol/L — ABNORMAL LOW (ref 19–32)
CREATININE: 1.14 mg/dL (ref 0.50–1.35)
Chloride: 112 mmol/L (ref 96–112)
GFR, EST NON AFRICAN AMERICAN: 80 mL/min — AB (ref >90)
GLUCOSE: 80 mg/dL (ref 70–99)
POTASSIUM: 3.3 mmol/L — AB (ref 3.5–5.1)
Sodium: 135 mmol/L (ref 135–145)
Total Bilirubin: 1 mg/dL (ref 0.3–1.2)
Total Protein: 5.5 g/dL — ABNORMAL LOW (ref 6.0–8.3)

## 2014-10-07 LAB — BASIC METABOLIC PANEL
ANION GAP: 11 (ref 5–15)
BUN: 27 mg/dL — ABNORMAL HIGH (ref 6–23)
CALCIUM: 7.9 mg/dL — AB (ref 8.4–10.5)
CHLORIDE: 110 mmol/L (ref 96–112)
CO2: 14 mmol/L — AB (ref 19–32)
Creatinine, Ser: 1.17 mg/dL (ref 0.50–1.35)
GFR calc Af Amer: 90 mL/min — ABNORMAL LOW (ref >90)
GFR calc non Af Amer: 78 mL/min — ABNORMAL LOW (ref >90)
Glucose, Bld: 84 mg/dL (ref 70–99)
POTASSIUM: 3.1 mmol/L — AB (ref 3.5–5.1)
SODIUM: 135 mmol/L (ref 135–145)

## 2014-10-07 LAB — MAGNESIUM: Magnesium: 1.8 mg/dL (ref 1.5–2.5)

## 2014-10-07 LAB — CBC
HCT: 31.6 % — ABNORMAL LOW (ref 39.0–52.0)
HEMOGLOBIN: 11.3 g/dL — AB (ref 13.0–17.0)
MCH: 25.8 pg — ABNORMAL LOW (ref 26.0–34.0)
MCHC: 35.8 g/dL (ref 30.0–36.0)
MCV: 72.1 fL — ABNORMAL LOW (ref 78.0–100.0)
Platelets: 192 10*3/uL (ref 150–400)
RBC: 4.38 MIL/uL (ref 4.22–5.81)
RDW: 14.3 % (ref 11.5–15.5)
WBC: 5 10*3/uL (ref 4.0–10.5)

## 2014-10-07 LAB — HIV 1/2 AB DIFFERENTIATION
HIV 1 Ab: REACTIVE — AB
HIV 2 Ab: NONREACTIVE

## 2014-10-07 LAB — HIV ANTIBODY (ROUTINE TESTING W REFLEX): HIV Screen 4th Generation wRfx: REACTIVE — AB

## 2014-10-07 MED ORDER — BOOST / RESOURCE BREEZE PO LIQD
1.0000 | Freq: Three times a day (TID) | ORAL | Status: DC
  Administered 2014-10-09 – 2014-10-13 (×9): 1 via ORAL

## 2014-10-07 MED ORDER — POTASSIUM CHLORIDE 10 MEQ/100ML IV SOLN
10.0000 meq | INTRAVENOUS | Status: AC
  Administered 2014-10-07 (×2): 10 meq via INTRAVENOUS
  Filled 2014-10-07 (×2): qty 100

## 2014-10-07 MED ORDER — GADOBENATE DIMEGLUMINE 529 MG/ML IV SOLN: 12.0000 mL | Freq: Once | INTRAVENOUS | Status: AC | PRN

## 2014-10-07 MED ORDER — SODIUM CHLORIDE 0.9 % IV BOLUS (SEPSIS): 250.0000 mL | Freq: Once | INTRAVENOUS | Status: DC

## 2014-10-07 NOTE — Progress Notes (Signed)
Progress Note Covering for Dr. Elnoria Howard   Subjective     Objective   Vital signs in last 24 hours: Temp:  [97.5 F (36.4 C)-97.9 F (36.6 C)] 97.6 F (36.4 C) (04/30 0948) Pulse Rate:  [65-85] 66 (04/30 0948) Resp:  [11-29] 11 (04/30 0948) BP: (86-115)/(52-76) 87/52 mmHg (04/30 0948) SpO2:  [92 %-100 %] 92 % (04/30 0948) Weight:  [123 lb 10.9 oz (56.1 kg)] 123 lb 10.9 oz (56.1 kg) (04/29 1849)   General:    Hispanic male in NAD Heart:  Regular rate and rhythm; no murmurs Lungs: Respirations even and unlabored, lungs CTA bilaterally Abdomen:  Soft, nontender and nondistended. Normal bowel sounds. Extremities:  Without edema. Neurologic:  Alert and oriented,  grossly normal neurologically. Psych:  Cooperative. Normal mood and affect.  Lab Results:  Recent Labs  10/06/14 1159 10/07/14 0302  WBC 6.4 5.0  HGB 15.5 11.3*  HCT 43.4 31.6*  PLT 302 192   BMET  Recent Labs  10/06/14 1700 10/07/14 0015 10/07/14 0302  NA 127* 135 135  K 2.7* 3.1* 3.3*  CL 100 110 112  CO2 13* 14* 12*  GLUCOSE 149* 84 80  BUN 32* 27* 26*  CREATININE 1.33 1.17 1.14  CALCIUM 8.2* 7.9* 8.0*   LFT  Recent Labs  10/07/14 0302  PROT 5.5*  ALBUMIN 2.5*  AST 47*  ALT 35  ALKPHOS 89  BILITOT 1.0   Studies/Results: Ct Abdomen Pelvis W Contrast  10/06/2014   CLINICAL DATA:  Lower abdominal pain for 2 weeks. Nausea and diarrhea. Headache.  EXAM: CT ABDOMEN AND PELVIS WITH CONTRAST  TECHNIQUE: Multidetector CT imaging of the abdomen and pelvis was performed using the standard protocol following bolus administration of intravenous contrast.  CONTRAST:  OMNIPAQUE IOHEXOL 300 MG/ML  SOLN  COMPARISON:  None.  FINDINGS: Visualized lung bases are clear allowing for mild motion artifact. No pleural effusion.  There is mild, diffuse intrahepatic biliary dilatation. There is also mild extrahepatic biliary dilatation, with the common bile duct measuring 9 mm in diameter. There is suggestion of  a small amount of soft tissue density in the distal aspect of the common bile duct (series 2, images 37 and 38), and a similar 10 mm focus is present dependently in the gallbladder, possibly reflecting gallstones. Gallbladder is moderately distended without gross wall thickening. Cystic duct also appears mildly to moderately distended. There is no pancreatic ductal dilatation, and no gross pancreatic or ampullary mass is identified.  3 mm hypodensity is noted in the right hepatic dome. The spleen, adrenal glands, right kidney, and pancreas have an unremarkable enhanced appearance. 3 mm hypodensity in the upper pole of the left kidney is too small to fully characterize but likely represents a cyst.  Oral contrast is present throughout the small bowel. There is no evidence of bowel obstruction. Liquid stool is present throughout the colon, consistent with history of recent diarrhea. Appendix is identified in the right lower quadrant/ upper pelvis and is unremarkable.  Bladder is unremarkable. No free fluid or enlarged lymph nodes are identified. No acute osseous abnormality is seen.  IMPRESSION: Mild diffuse intrahepatic and extrahepatic biliary dilatation with possible choledocholithiasis and cholelithiasis. Consider further evaluation with ERCP or MRCP. Other considerations might include a biliary stricture or cholangitis.   Electronically Signed   By: Sebastian Ache   On: 10/06/2014 13:09   Mr 3d Recon At Scanner  10/07/2014   CLINICAL DATA:  Right upper quadrant abdominal pain. Nausea and  vomiting, and diarrhea. Biliary dilatation and possible choledocholithiasis seen on recent CT.  EXAM: MRI ABDOMEN WITHOUT AND WITH CONTRAST (INCLUDING MRCP)  TECHNIQUE: Multiplanar multisequence MR imaging of the abdomen was performed both before and after the administration of intravenous contrast. Heavily T2-weighted images of the biliary and pancreatic ducts were obtained, and three-dimensional MRCP images were rendered by  post processing.  CONTRAST:  12 mL MultiHance  COMPARISON:  CT on 10/06/2014  FINDINGS: Lower chest:  Unremarkable.  Hepatobiliary: No liver masses are identified. Gallbladder is distended and contains several small gallstones, however there is no evidence of gallbladder wall thickening or pericholecystic inflammatory changes.  Mild diffuse biliary ductal dilatation is demonstrated with common bile duct measuring 8 mm. Two or 3 small less than 1 cm calculi are seen in the distal common bile duct.  Pancreas: No masses, inflammatory changes, or fluid collections demonstrated. No evidence pancreatic ductal dilatation or pancreas divisum.  Spleen:  Within normal limits in size and appearance.  Adrenal Glands:  No masses identified.  Kidneys: No renal masses identified. No evidence of hydronephrosis. Tiny sub-cm cyst is seen in the anterior upper pole of the left kidney.  Stomach/Bowel/Peritoneum: No evidence of wall thickening, mass, or obstruction involving visualized abdominal bowel.  Vascular/Lymphatic: No pathologically enlarged lymph nodes identified. No other significant abnormality noted.  Other:  None.  Musculoskeletal:  No suspicious bone lesions identified.  IMPRESSION: Cholelithiasis, without definite radiographic signs of acute cholecystitis.  Diffuse biliary ductal dilatation secondary to choledocholithiasis; 2 or 3 small less than 1 cm calculi in the distal common bile duct.   Electronically Signed   By: Myles Rosenthal M.D.   On: 10/07/2014 09:29   Mr Abd W/wo Cm/mrcp  10/07/2014   CLINICAL DATA:  Right upper quadrant abdominal pain. Nausea and vomiting, and diarrhea. Biliary dilatation and possible choledocholithiasis seen on recent CT.  EXAM: MRI ABDOMEN WITHOUT AND WITH CONTRAST (INCLUDING MRCP)  TECHNIQUE: Multiplanar multisequence MR imaging of the abdomen was performed both before and after the administration of intravenous contrast. Heavily T2-weighted images of the biliary and pancreatic ducts were  obtained, and three-dimensional MRCP images were rendered by post processing.  CONTRAST:  12 mL MultiHance  COMPARISON:  CT on 10/06/2014  FINDINGS: Lower chest:  Unremarkable.  Hepatobiliary: No liver masses are identified. Gallbladder is distended and contains several small gallstones, however there is no evidence of gallbladder wall thickening or pericholecystic inflammatory changes.  Mild diffuse biliary ductal dilatation is demonstrated with common bile duct measuring 8 mm. Two or 3 small less than 1 cm calculi are seen in the distal common bile duct.  Pancreas: No masses, inflammatory changes, or fluid collections demonstrated. No evidence pancreatic ductal dilatation or pancreas divisum.  Spleen:  Within normal limits in size and appearance.  Adrenal Glands:  No masses identified.  Kidneys: No renal masses identified. No evidence of hydronephrosis. Tiny sub-cm cyst is seen in the anterior upper pole of the left kidney.  Stomach/Bowel/Peritoneum: No evidence of wall thickening, mass, or obstruction involving visualized abdominal bowel.  Vascular/Lymphatic: No pathologically enlarged lymph nodes identified. No other significant abnormality noted.  Other:  None.  Musculoskeletal:  No suspicious bone lesions identified.  IMPRESSION: Cholelithiasis, without definite radiographic signs of acute cholecystitis.  Diffuse biliary ductal dilatation secondary to choledocholithiasis; 2 or 3 small less than 1 cm calculi in the distal common bile duct.   Electronically Signed   By: Myles Rosenthal M.D.   On: 10/07/2014 09:29  Assessment / Plan:   341. 39 year old male with abdominal pain, upper and lower with nausea, vomiting and diarrhea.. Liquid stool in bowel on CTscan, no inflammatory changes of bowel. C-diff pending.   2. Cholelithiasis / choledocholithiasis. LFTs normal except for mildly elevated AST but MRCP demonstrates small distal CBD stones. Patient will need ERCP with stone extraction tomorrow. Will  discontinue Lovenox, he has a dose due tonight at 8pm.   3. Microcytosis, not anemic except for today's lab with could be from IVF dilution.  4. Hypokalemia, improved after K+ runs. Now at 3.3. Check K+ in am to be sure stable as patient for ERCP tomorrow.    LOS: 1 day   Jon Love  10/07/2014, 12:01 PM   GI Attending Note  I have personally taken an interval history, reviewed the chart, and examined the patient. MRCP clearly demonstrates CBD stones.  Plan ERCP in am.  Barbette Hairobert D. Arlyce DiceKaplan, MD, [facility] Hospital[facility]ACG [facility] [phone]

## 2014-10-07 NOTE — Progress Notes (Signed)
Initial Nutrition Assessment  DOCUMENTATION CODES:  Severe malnutrition in context of acute illness/injury  INTERVENTION: Recommend monitoring magnesium, potassium, and phosphorus daily for at least 3 days, MD to replete as needed, as pt is at risk for refeeding syndrome given inability to keep food/beverages down for > 7 days.  Diet advancement per MD  Add supplements when diet is advanced: Boost Breeze, [employer]ure Enlive (each supplement provides 350kcal and 20 grams of protein)  NUTRITION DIAGNOSIS:  Malnutrition related to vomiting as evidenced by severe depletion of muscle mass, percent weight loss.   GOAL:  Patient will meet greater than or equal to 90% of their needs   MONITOR:  PO intake, Supplement acceptance, Diet advancement, Labs, I & O's  REASON FOR ASSESSMENT:  Malnutrition Screening Tool    ASSESSMENT: 39 yo Spanish speaking male presents with diarrhea and abdominal pain. Found to have cholelithiasis, biliary ductal dilatation concerning for choledocholithiasis.   [employer] id [phone] used to communicate with patient. Pt reports that he as been able to eat but, unable to keep any food down for the past month. Pt thinks he has lost 5 or 6 lbs in the past couple weeks; he is unsure of usual body weight. Weight history shows pt has lost 10 lbs in the past 20 days- 7.5% weight loss is severe for time frame. Pt states that before developing nausea and vomiting he had a great appetite and was eating very well. Pt is now underweight and per physical exam has severe muscle wasting. RD discussed the importance of protein intake when diet is advanced; pt agreeable to receiving nutritional supplements  Labs and medications reviewed. Low potassium, low calcium, low hemoglobin  Height:  Ht Readings from Last 1 Encounters:  10/06/14 5\' 11"  (1.803 m)    Weight:  Wt Readings from Last 1 Encounters:  10/06/14 123 lb 10.9 oz (56.1 kg)    Ideal Body Weight:  78  kg  Wt Readings from Last 10 Encounters:  10/06/14 123 lb 10.9 oz (56.1 kg)  09/17/14 133 lb 6 oz (60.499 kg)    BMI:  Body mass index is 17.26 kg/(m^2).  Estimated Nutritional Needs:  Kcal:  2000-2200  Protein:  70-80 grams  Fluid:  >/=1.7 L/day  Skin:  Reviewed, no issues  Diet Order:  Diet NPO time specified Except for: Ice Chips  EDUCATION NEEDS:  No education needs identified at this time   Intake/Output Summary (Last 24 hours) at 10/07/14 1102 Last data filed at 10/07/14 0113  Gross per 24 hour  Intake 1845.83 ml  Output   1025 ml  Net 820.83 ml    Last BM:  PTA   Ian Malkineanne Barnett RD, LDN Inpatient Clinical Dietitian Pager: [phone] After Hours Pager: [phone]

## 2014-10-07 NOTE — Progress Notes (Signed)
[facility] PROGRESS NOTE  Jon Love WJX:[id number] DOB: [birth year] DOA: 10/06/2014 PCP: No PCP Per Patient   summary: 39 yo Spanish speaking male presents with diarrhea and abdominal pain. Found to have cholelithiasis, biliary ductal dilatation concerning for choledocholithiasis. Surgery and GI consultation in the emergency room. Stool studies ordered, MRCP ordered and empiric antibiotics ordered.  Assessment/Plan: Abnormal CT of the abdomen with biliary ductal dilitation:  MRCP pending. Symptoms much better. If MRCP looks okay, will start clears. Transfer to MedSurg. Active Problems:   Hypokalemia secondary to diarrhea: improving. Still slightly low. Will replete IV and check a magnesium level.   Acute renal failure:  Improved. Secondary to prerenal azotemia. Continue IV fluids.   Abnormal transaminases, mild. Will check acute hepatitis panel.   RUQ pain improving   Metabolic acidosis secondary to diarrhea. Lactate normal. Monitor.   Diarrhea: Per patient, less voluminous. Stool studies have not yet been collected. I've discussed with nursing staff.   Hyponatremia, hypovolemic, resolved with IV hydration   Cholelithiasis:  Surgery following.   Code Status:  full Family Communication:   Disposition Plan:  Home once medically stable. Transfer to MedSurg today.  Consultants:  GI: Hung  Gen. surgery  Procedures:     Antibiotics:  Cipro, Flagyl 4/29  HPI/Subjective: Feels slightly better. Pain less intense. No vomiting. Feels hungry. Diarrhea less frequent and less voluminous.  Objective: Filed Vitals:   10/07/14 0100  BP: 91/63  Pulse: 65  Temp:   Resp: 11    Intake/Output Summary (Last 24 hours) at 10/07/14 0835 Last data filed at 10/07/14 0113  Gross per 24 hour  Intake 1845.83 ml  Output   1025 ml  Net 820.83 ml   Filed Weights   10/06/14 1849  Weight: 56.1 kg (123 lb 10.9 oz)    Exam:   General:  Nontoxic appearing. Pleasant  cooperative.  Cardiovascular: Regular rate rhythm without murmurs gallops rubs  Respiratory: Clear to auscultation bilaterally without wheezes rhonchi or rales  Abdomen: Soft nontender nondistended  Ext: No clubbing cyanosis or edema  Basic Metabolic Panel:  Recent Labs Lab 10/06/14 1159 10/06/14 1700 10/07/14 0015 10/07/14 0302  NA 130* 127* 135 135  K 2.5* 2.7* 3.1* 3.3*  CL 98 100 110 112  CO2 17* 13* 14* 12*  GLUCOSE 154* 149* 84 80  BUN 35* 32* 27* 26*  CREATININE 1.97* 1.33 1.17 1.14  CALCIUM 8.8 8.2* 7.9* 8.0*   Liver Function Tests:  Recent Labs Lab 10/06/14 1159 10/07/14 0302  AST 66* 47*  ALT 50 35  ALKPHOS 134* 89  BILITOT 1.8* 1.0  PROT 8.1 5.5*  ALBUMIN 3.7 2.5*    Recent Labs Lab 10/06/14 1159  LIPASE 36   No results for input(s): AMMONIA in the last 168 hours. CBC:  Recent Labs Lab 10/06/14 1159 10/07/14 0302  WBC 6.4 5.0  NEUTROABS 4.2  --   HGB 15.5 11.3*  HCT 43.4 31.6*  MCV 73.1* 72.1*  PLT 302 192   Cardiac Enzymes: No results for input(s): CKTOTAL, CKMB, CKMBINDEX, TROPONINI in the last 168 hours. BNP (last 3 results) No results for input(s): BNP in the last 8760 hours.  ProBNP (last 3 results) No results for input(s): PROBNP in the last 8760 hours.  CBG: No results for input(s): GLUCAP in the last 168 hours.  Recent Results (from the past 240 hour(s))  Culture, blood (routine x 2)     Status: None (Preliminary result)   Collection Time: 10/06/14  3:39 PM  Result Value Ref Range Status   Specimen Description BLOOD RIGHT WRIST  Final   Special Requests BOTTLES DRAWN AEROBIC AND ANAEROBIC 5CC  Final   Culture   Final           BLOOD CULTURE RECEIVED NO GROWTH TO DATE CULTURE WILL BE HELD FOR 5 DAYS BEFORE ISSUING A FINAL NEGATIVE REPORT Performed at [facility]    Report Status PENDING  Incomplete  Culture, blood (routine x 2)     Status: None (Preliminary result)   Collection Time: 10/06/14  3:43 PM   Result Value Ref Range Status   Specimen Description BLOOD RIGHT ARM  Final   Special Requests BOTTLES DRAWN AEROBIC AND ANAEROBIC 5CC  Final   Culture   Final           BLOOD CULTURE RECEIVED NO GROWTH TO DATE CULTURE WILL BE HELD FOR 5 DAYS BEFORE ISSUING A FINAL NEGATIVE REPORT Performed at [facility]    Report Status PENDING  Incomplete  MRSA PCR Screening     Status: None   Collection Time: 10/06/14  6:58 PM  Result Value Ref Range Status   MRSA by PCR NEGATIVE NEGATIVE Final    Comment:        The GeneXpert MRSA Assay (FDA approved for NASAL specimens only), is one component of a comprehensive MRSA colonization surveillance program. It is not intended to diagnose MRSA infection nor to guide or monitor treatment for MRSA infections.      Studies: Ct Abdomen Pelvis W Contrast  10/06/2014   CLINICAL DATA:  Lower abdominal pain for 2 weeks. Nausea and diarrhea. Headache.  EXAM: CT ABDOMEN AND PELVIS WITH CONTRAST  TECHNIQUE: Multidetector CT imaging of the abdomen and pelvis was performed using the standard protocol following bolus administration of intravenous contrast.  CONTRAST:  OMNIPAQUE IOHEXOL 300 MG/ML  SOLN  COMPARISON:  None.  FINDINGS: Visualized lung bases are clear allowing for mild motion artifact. No pleural effusion.  There is mild, diffuse intrahepatic biliary dilatation. There is also mild extrahepatic biliary dilatation, with the common bile duct measuring 9 mm in diameter. There is suggestion of a small amount of soft tissue density in the distal aspect of the common bile duct (series 2, images 37 and 38), and a similar 10 mm focus is present dependently in the gallbladder, possibly reflecting gallstones. Gallbladder is moderately distended without gross wall thickening. Cystic duct also appears mildly to moderately distended. There is no pancreatic ductal dilatation, and no gross pancreatic or ampullary mass is identified.  3 mm hypodensity is noted  in the right hepatic dome. The spleen, adrenal glands, right kidney, and pancreas have an unremarkable enhanced appearance. 3 mm hypodensity in the upper pole of the left kidney is too small to fully characterize but likely represents a cyst.  Oral contrast is present throughout the small bowel. There is no evidence of bowel obstruction. Liquid stool is present throughout the colon, consistent with history of recent diarrhea. Appendix is identified in the right lower quadrant/ upper pelvis and is unremarkable.  Bladder is unremarkable. No free fluid or enlarged lymph nodes are identified. No acute osseous abnormality is seen.  IMPRESSION: Mild diffuse intrahepatic and extrahepatic biliary dilatation with possible choledocholithiasis and cholelithiasis. Consider further evaluation with ERCP or MRCP. Other considerations might include a biliary stricture or cholangitis.   Electronically Signed   By: Sebastian Ache   On: 10/06/2014 13:09    Scheduled Meds: . ciprofloxacin  400  mg Intravenous Q12H  . enoxaparin (LOVENOX) injection  40 mg Subcutaneous Q24H  . folic acid  1 mg Intravenous Daily  . metronidazole  500 mg Intravenous Q8H  . potassium chloride  10 mEq Intravenous Q1 Hr x 3  . sodium chloride  250 mL Intravenous Once  . thiamine  100 mg Intravenous Daily   Continuous Infusions: . sodium chloride 1,000 mL (10/06/14 2138)    Time spent: 35 minutes  Armend Hochstatter L  [facility] Pager [phone]. If 7PM-7AM, please contact night-coverage at [email or web address], password [facility] 10/07/2014, 8:35 AM  LOS: 1 day

## 2014-10-08 ENCOUNTER — Encounter (HOSPITAL_COMMUNITY): Payer: Self-pay | Admitting: Anesthesiology

## 2014-10-08 ENCOUNTER — Inpatient Hospital Stay (HOSPITAL_COMMUNITY): Payer: MEDICAID

## 2014-10-08 ENCOUNTER — Encounter (HOSPITAL_COMMUNITY)
Admission: EM | Disposition: A | Discharge status: Home / Self Care | Payer: Self-pay | Source: Home / Self Care | Attending: Internal Medicine

## 2014-10-08 ENCOUNTER — Encounter (HOSPITAL_COMMUNITY): Payer: Self-pay | Admitting: *Deleted

## 2014-10-08 DIAGNOSIS — A419 Sepsis, unspecified organism: Secondary | ICD-10-CM

## 2014-10-08 DIAGNOSIS — D689 Coagulation defect, unspecified: Secondary | ICD-10-CM

## 2014-10-08 DIAGNOSIS — I959 Hypotension, unspecified: Secondary | ICD-10-CM | POA: Diagnosis not present

## 2014-10-08 DIAGNOSIS — B20 Human immunodeficiency virus [HIV] disease: Secondary | ICD-10-CM | POA: Diagnosis present

## 2014-10-08 DIAGNOSIS — Z21 Asymptomatic human immunodeficiency virus [HIV] infection status: Secondary | ICD-10-CM

## 2014-10-08 DIAGNOSIS — R6521 Severe sepsis with septic shock: Secondary | ICD-10-CM

## 2014-10-08 DIAGNOSIS — I9589 Other hypotension: Secondary | ICD-10-CM

## 2014-10-08 LAB — HEPATITIS PANEL, ACUTE
HCV Ab: NEGATIVE
HEP A IGM: NONREACTIVE
HEP B S AG: NEGATIVE
Hep B C IgM: NONREACTIVE

## 2014-10-08 LAB — BASIC METABOLIC PANEL
ANION GAP: 7 (ref 5–15)
ANION GAP: 4 — AB (ref 5–15)
BUN: 11 mg/dL (ref 6–20)
BUN: 9 mg/dL (ref 6–20)
CALCIUM: 8.1 mg/dL — AB (ref 8.9–10.3)
CALCIUM: 7.3 mg/dL — AB (ref 8.9–10.3)
CHLORIDE: 118 mmol/L — AB (ref 101–111)
CO2: 11 mmol/L — ABNORMAL LOW (ref 22–32)
CO2: 13 mmol/L — AB (ref 22–32)
CREATININE: 1 mg/dL (ref 0.61–1.24)
Chloride: 123 mmol/L — ABNORMAL HIGH (ref 101–111)
Creatinine, Ser: 1.11 mg/dL (ref 0.61–1.24)
GFR calc Af Amer: >60 mL/min (ref >60)
GFR calc Af Amer: >60 mL/min (ref >60)
GFR calc non Af Amer: >60 mL/min (ref >60)
GLUCOSE: 84 mg/dL (ref 70–99)
Glucose, Bld: 79 mg/dL (ref 70–99)
POTASSIUM: 2.8 mmol/L — AB (ref 3.5–5.1)
Potassium: 3.4 mmol/L — ABNORMAL LOW (ref 3.5–5.1)
SODIUM: 136 mmol/L (ref 135–145)
SODIUM: 140 mmol/L (ref 135–145)

## 2014-10-08 LAB — CLOSTRIDIUM DIFFICILE BY PCR: Toxigenic C. Difficile by PCR: NEGATIVE

## 2014-10-08 LAB — PROTIME-INR
INR: 2.38 — ABNORMAL HIGH (ref 0.00–1.49)
PROTHROMBIN TIME: 26.2 s — AB (ref 11.6–15.2)

## 2014-10-08 LAB — LACTIC ACID, PLASMA: Lactic Acid, Venous: 1.6 mmol/L (ref 0.5–2.0)

## 2014-10-08 LAB — APTT: APTT: 37 s (ref 24–37)

## 2014-10-08 LAB — PROCALCITONIN

## 2014-10-08 SURGERY — ERCP, WITH INTERVENTION IF INDICATED
Anesthesia: Moderate Sedation

## 2014-10-08 SURGERY — ERCP, WITH INTERVENTION IF INDICATED
Anesthesia: General

## 2014-10-08 SURGERY — CANCELLED PROCEDURE

## 2014-10-08 MED ORDER — VITAMIN K1 10 MG/ML IJ SOLN
10.0000 mg | INTRAMUSCULAR | Status: DC
  Administered 2014-10-08: 10 mg via SUBCUTANEOUS
  Filled 2014-10-08 (×2): qty 1

## 2014-10-08 MED ORDER — SODIUM CHLORIDE 0.9 % IV BOLUS (SEPSIS)
500.0000 mL | Freq: Once | INTRAVENOUS | Status: AC
  Administered 2014-10-08: 500 mL via INTRAVENOUS

## 2014-10-08 MED ORDER — LACTATED RINGERS IV SOLN: INTRAVENOUS | Status: DC

## 2014-10-08 MED ORDER — SODIUM CHLORIDE 0.9 % IV SOLN
500.0000 mg | Freq: Three times a day (TID) | INTRAVENOUS | Status: DC
  Administered 2014-10-08 – 2014-10-11 (×10): 500 mg via INTRAVENOUS
  Filled 2014-10-08 (×13): qty 500

## 2014-10-08 MED ORDER — INDOMETHACIN 50 MG RE SUPP
100.0000 mg | Freq: Once | RECTAL | Status: DC
  Filled 2014-10-08 (×2): qty 2

## 2014-10-08 MED ORDER — VITAMIN K1 10 MG/ML IJ SOLN: 10.0000 mg | Freq: Once | INTRAMUSCULAR | Status: DC

## 2014-10-08 MED ORDER — SODIUM CHLORIDE 0.9 % IV BOLUS (SEPSIS)
1000.0000 mL | Freq: Once | INTRAVENOUS | Status: AC
  Administered 2014-10-08: 1000 mL via INTRAVENOUS

## 2014-10-08 MED ORDER — SODIUM BICARBONATE 8.4 % IV SOLN
INTRAVENOUS | Status: DC
  Administered 2014-10-08 – 2014-10-09 (×2): via INTRAVENOUS
  Filled 2014-10-08 (×5): qty 150

## 2014-10-08 MED ORDER — POTASSIUM CHLORIDE 10 MEQ/100ML IV SOLN
10.0000 meq | INTRAVENOUS | Status: AC
  Administered 2014-10-08 (×4): 10 meq via INTRAVENOUS
  Filled 2014-10-08 (×4): qty 100

## 2014-10-08 MED ORDER — VITAMIN K1 10 MG/ML IJ SOLN: 5.0000 mg | Freq: Once | INTRAMUSCULAR | Status: DC

## 2014-10-08 NOTE — Interval H&P Note (Signed)
History and Physical Interval Note:  10/08/2014 10:43 AM  Jon Love  has presented today for surgery, with the diagnosis of CBD stones  The various methods of treatment have been discussed with the patient and family. After consideration of risks, benefits and other options for treatment, the patient has consented to  Procedure(s): ENDOSCOPIC RETROGRADE CHOLANGIOPANCREATOGRAPHY (ERCP) (N/A) as a surgical intervention .  The patient's history has been reviewed, patient examined, no change in status, stable for surgery.  I have reviewed the patient's chart and labs.  Questions were answered to the patient's satisfaction.    The recent H&P (dated *10/07/14 **) was reviewed, the patient was examined and there is no change in the patients condition since that H&P was completed.   Melvia HeapsRobert Farmer Mccahill  10/08/2014, 10:43 AM    Melvia Heapsobert Dayannara Pascal

## 2014-10-08 NOTE — Progress Notes (Signed)
Pt transferred to 2H14 from [place], report given to [employer] Department Storesonya RN

## 2014-10-08 NOTE — Progress Notes (Addendum)
ERCP cancelled due to hypokalemia.  Called by RN about BP still low 83/51. Will call code sepsis/sepsis bundle. On 2nd liter saline. Pt mentating and was in the bathroom when I walked in. Lungs CTA. CV regular. Extremities warm. Also, I told patient about HIV results. He denies IVDA or previous h/o HIV.  Will need ICU (or stepdown).  Change abx to primaxin.  D/w PCCM  Cc time 30 minutes  Crista Curborinna Zeena Starkel, MD [facility]

## 2014-10-08 NOTE — Progress Notes (Signed)
Pt's blood pressure low (86/53), MD aware given 500 NS bolus per MD order, BP came up to 91/54. Attending MD ok with going to Endoscopy for ERCP.     10/08/14 0819 10/08/14 0917 10/08/14 1023  Vitals  BP (!) 86/53 mmHg (!) 84/52 mmHg (!) 91/54 mmHg

## 2014-10-08 NOTE — H&P (View-Only) (Signed)
  Progress Note Covering for Dr. Hung   Subjective     Objective   Vital signs in last 24 hours: Temp:  [97.5 F (36.4 C)-97.9 F (36.6 C)] 97.6 F (36.4 C) (04/30 0948) Pulse Rate:  [65-85] 66 (04/30 0948) Resp:  [11-29] 11 (04/30 0948) BP: (86-115)/(52-76) 87/52 mmHg (04/30 0948) SpO2:  [92 %-100 %] 92 % (04/30 0948) Weight:  [123 lb 10.9 oz (56.1 kg)] 123 lb 10.9 oz (56.1 kg) (04/29 1849)   General:    Hispanic male in NAD Heart:  Regular rate and rhythm; no murmurs Lungs: Respirations even and unlabored, lungs CTA bilaterally Abdomen:  Soft, nontender and nondistended. Normal bowel sounds. Extremities:  Without edema. Neurologic:  Alert and oriented,  grossly normal neurologically. Psych:  Cooperative. Normal mood and affect.  Lab Results:  Recent Labs  10/06/14 1159 10/07/14 0302  WBC 6.4 5.0  HGB 15.5 11.3*  HCT 43.4 31.6*  PLT 302 192   BMET  Recent Labs  10/06/14 1700 10/07/14 0015 10/07/14 0302  NA 127* 135 135  K 2.7* 3.1* 3.3*  CL 100 110 112  CO2 13* 14* 12*  GLUCOSE 149* 84 80  BUN 32* 27* 26*  CREATININE 1.33 1.17 1.14  CALCIUM 8.2* 7.9* 8.0*   LFT  Recent Labs  10/07/14 0302  PROT 5.5*  ALBUMIN 2.5*  AST 47*  ALT 35  ALKPHOS 89  BILITOT 1.0   Studies/Results: Ct Abdomen Pelvis W Contrast  10/06/2014   CLINICAL DATA:  Lower abdominal pain for 2 weeks. Nausea and diarrhea. Headache.  EXAM: CT ABDOMEN AND PELVIS WITH CONTRAST  TECHNIQUE: Multidetector CT imaging of the abdomen and pelvis was performed using the standard protocol following bolus administration of intravenous contrast.  CONTRAST:  100mL OMNIPAQUE IOHEXOL 300 MG/ML  SOLN  COMPARISON:  None.  FINDINGS: Visualized lung bases are clear allowing for mild motion artifact. No pleural effusion.  There is mild, diffuse intrahepatic biliary dilatation. There is also mild extrahepatic biliary dilatation, with the common bile duct measuring 9 mm in diameter. There is suggestion of  a small amount of soft tissue density in the distal aspect of the common bile duct (series 2, images 37 and 38), and a similar 10 mm focus is present dependently in the gallbladder, possibly reflecting gallstones. Gallbladder is moderately distended without gross wall thickening. Cystic duct also appears mildly to moderately distended. There is no pancreatic ductal dilatation, and no gross pancreatic or ampullary mass is identified.  3 mm hypodensity is noted in the right hepatic dome. The spleen, adrenal glands, right kidney, and pancreas have an unremarkable enhanced appearance. 3 mm hypodensity in the upper pole of the left kidney is too small to fully characterize but likely represents a cyst.  Oral contrast is present throughout the small bowel. There is no evidence of bowel obstruction. Liquid stool is present throughout the colon, consistent with history of recent diarrhea. Appendix is identified in the right lower quadrant/ upper pelvis and is unremarkable.  Bladder is unremarkable. No free fluid or enlarged lymph nodes are identified. No acute osseous abnormality is seen.  IMPRESSION: Mild diffuse intrahepatic and extrahepatic biliary dilatation with possible choledocholithiasis and cholelithiasis. Consider further evaluation with ERCP or MRCP. Other considerations might include a biliary stricture or cholangitis.   Electronically Signed   By: Allen  Grady   On: 10/06/2014 13:09   Mr 3d Recon At Scanner  10/07/2014   CLINICAL DATA:  Right upper quadrant abdominal pain. Nausea and   vomiting, and diarrhea. Biliary dilatation and possible choledocholithiasis seen on recent CT.  EXAM: MRI ABDOMEN WITHOUT AND WITH CONTRAST (INCLUDING MRCP)  TECHNIQUE: Multiplanar multisequence MR imaging of the abdomen was performed both before and after the administration of intravenous contrast. Heavily T2-weighted images of the biliary and pancreatic ducts were obtained, and three-dimensional MRCP images were rendered by  post processing.  CONTRAST:  12 mL MultiHance  COMPARISON:  CT on 10/06/2014  FINDINGS: Lower chest:  Unremarkable.  Hepatobiliary: No liver masses are identified. Gallbladder is distended and contains several small gallstones, however there is no evidence of gallbladder wall thickening or pericholecystic inflammatory changes.  Mild diffuse biliary ductal dilatation is demonstrated with common bile duct measuring 8 mm. Two or 3 small less than 1 cm calculi are seen in the distal common bile duct.  Pancreas: No masses, inflammatory changes, or fluid collections demonstrated. No evidence pancreatic ductal dilatation or pancreas divisum.  Spleen:  Within normal limits in size and appearance.  Adrenal Glands:  No masses identified.  Kidneys: No renal masses identified. No evidence of hydronephrosis. Tiny sub-cm cyst is seen in the anterior upper pole of the left kidney.  Stomach/Bowel/Peritoneum: No evidence of wall thickening, mass, or obstruction involving visualized abdominal bowel.  Vascular/Lymphatic: No pathologically enlarged lymph nodes identified. No other significant abnormality noted.  Other:  None.  Musculoskeletal:  No suspicious bone lesions identified.  IMPRESSION: Cholelithiasis, without definite radiographic signs of acute cholecystitis.  Diffuse biliary ductal dilatation secondary to choledocholithiasis; 2 or 3 small less than 1 cm calculi in the distal common bile duct.   Electronically Signed   By: John  Stahl M.D.   On: 10/07/2014 09:29   Mr Abd W/wo Cm/mrcp  10/07/2014   CLINICAL DATA:  Right upper quadrant abdominal pain. Nausea and vomiting, and diarrhea. Biliary dilatation and possible choledocholithiasis seen on recent CT.  EXAM: MRI ABDOMEN WITHOUT AND WITH CONTRAST (INCLUDING MRCP)  TECHNIQUE: Multiplanar multisequence MR imaging of the abdomen was performed both before and after the administration of intravenous contrast. Heavily T2-weighted images of the biliary and pancreatic ducts were  obtained, and three-dimensional MRCP images were rendered by post processing.  CONTRAST:  12 mL MultiHance  COMPARISON:  CT on 10/06/2014  FINDINGS: Lower chest:  Unremarkable.  Hepatobiliary: No liver masses are identified. Gallbladder is distended and contains several small gallstones, however there is no evidence of gallbladder wall thickening or pericholecystic inflammatory changes.  Mild diffuse biliary ductal dilatation is demonstrated with common bile duct measuring 8 mm. Two or 3 small less than 1 cm calculi are seen in the distal common bile duct.  Pancreas: No masses, inflammatory changes, or fluid collections demonstrated. No evidence pancreatic ductal dilatation or pancreas divisum.  Spleen:  Within normal limits in size and appearance.  Adrenal Glands:  No masses identified.  Kidneys: No renal masses identified. No evidence of hydronephrosis. Tiny sub-cm cyst is seen in the anterior upper pole of the left kidney.  Stomach/Bowel/Peritoneum: No evidence of wall thickening, mass, or obstruction involving visualized abdominal bowel.  Vascular/Lymphatic: No pathologically enlarged lymph nodes identified. No other significant abnormality noted.  Other:  None.  Musculoskeletal:  No suspicious bone lesions identified.  IMPRESSION: Cholelithiasis, without definite radiographic signs of acute cholecystitis.  Diffuse biliary ductal dilatation secondary to choledocholithiasis; 2 or 3 small less than 1 cm calculi in the distal common bile duct.   Electronically Signed   By: John  Stahl M.D.   On: 10/07/2014 09:29       Assessment / Plan:   1. 38 year old male with abdominal pain, upper and lower with nausea, vomiting and diarrhea.. Liquid stool in bowel on CTscan, no inflammatory changes of bowel. C-diff pending.   2. Cholelithiasis / choledocholithiasis. LFTs normal except for mildly elevated AST but MRCP demonstrates small distal CBD stones. Patient will need ERCP with stone extraction tomorrow. Will  discontinue Lovenox, he has a dose due tonight at 8pm.   3. Microcytosis, not anemic except for today's lab with could be from IVF dilution.  4. Hypokalemia, improved after K+ runs. Now at 3.3. Check K+ in am to be sure stable as patient for ERCP tomorrow.    LOS: 1 day   Paula Guenther  10/07/2014, 12:01 PM   GI Attending Note  I have personally taken an interval history, reviewed the chart, and examined the patient. MRCP clearly demonstrates CBD stones.  Plan ERCP in am.  Robert D. Kaplan, MD, FACG [facility] [phone]     

## 2014-10-08 NOTE — Consult Note (Signed)
PHARMACY CONSULT NOTE   Pharmacy Consult for :   Primaxin Indication:  Code Sepsis, r/o Bile Duct infection  Hospital Problems: Principal Problem:   Choledocholithiasis Active Problems:   Hypokalemia   Acute renal failure   Abnormal transaminases   Metabolic acidosis   Abnormal CT of the abdomen with biliary ductal dilitation   Diarrhea   Hyponatremia   Cholelithiasis   Protein-calorie malnutrition, severe   Intrahepatic bile duct stones   Hypotension   HIV antibody positive   Sepsis   Allergies: Allergies  Allergen Reactions  . Penicillins     "hives"    Patient Measurements: Height:  (180.3 cm) Weight: 127 lb 6.4 oz (57.788 kg) IBW/kg (Calculated) : 75.3 Vital Signs: Temp: 97.5 F (36.4 C) (05/01 1027) Temp Source: Oral (05/01 1027) BP: 82/50 mmHg (05/01 1257) Pulse Rate: 59 (05/01 1052)  Labs:  Recent Labs  10/06/14 1159 10/07/14 0015 10/07/14 0302 10/08/14 0740  WBC 6.4  --  5.0  --   HGB 15.5  --  11.3*  --   PLT 302  --  192  --   CREATININE 1.97* 1.17 1.14 1.11   Estimated Creatinine Clearance: 73.8 mL/min (by C-G formula based on Cr of 1.11).  Microbiology: Recent Results (from the past 720 hour(s))  Culture, blood (routine x 2)     Status: None (Preliminary result)   Collection Time: 10/06/14  3:39 PM  Result Value Ref Range Status   Specimen Description BLOOD RIGHT WRIST  Final   Special Requests BOTTLES DRAWN AEROBIC AND ANAEROBIC 5CC  Final   Culture   Final           BLOOD CULTURE RECEIVED NO GROWTH TO DATE CULTURE WILL BE HELD FOR 5 DAYS BEFORE ISSUING A FINAL NEGATIVE REPORT Performed at [facility]    Report Status PENDING  Incomplete  Culture, blood (routine x 2)     Status: None (Preliminary result)   Collection Time: 10/06/14  3:43 PM  Result Value Ref Range Status   Specimen Description BLOOD RIGHT ARM  Final   Special Requests BOTTLES DRAWN AEROBIC AND ANAEROBIC 5CC  Final   Culture   Final   BLOOD CULTURE RECEIVED NO GROWTH TO DATE CULTURE WILL BE HELD FOR 5 DAYS BEFORE ISSUING A FINAL NEGATIVE REPORT Performed at [facility]    Report Status PENDING  Incomplete  MRSA PCR Screening     Status: None   Collection Time: 10/06/14  6:58 PM  Result Value Ref Range Status   MRSA by PCR NEGATIVE NEGATIVE Final  Clostridium Difficile by PCR     Status: None   Collection Time: 10/08/14  9:00 AM  Result Value Ref Range Status   C difficile by pcr NEGATIVE NEGATIVE Final    Medical/Surgical History: History reviewed. No pertinent past medical history. History reviewed. No pertinent past surgical history.  Current Medication[s] Include: Prior to Admission: Prescriptions prior to admission  Medication Sig Dispense Refill Last Dose  . Multiple Vitamin (MULTIVITAMIN) tablet Take 1 tablet by mouth daily.   Past Week at Unknown time  . omeprazole (PRILOSEC) 20 MG capsule Take 1 capsule (20 mg total) by mouth daily. (Patient not taking: Reported on 10/06/2014) 15 capsule 3   . promethazine (PHENERGAN) 12.5 MG tablet Take 1 tablet (12.5 mg total) by mouth every 8 (eight) hours as needed for nausea or vomiting. (Patient not taking: Reported on 10/06/2014) 20 tablet 0    Scheduled:  Scheduled:  .  feeding supplement (RESOURCE BREEZE)  1 Container Oral TID BM  . folic acid  1 mg Intravenous Daily  . imipenem-cilastatin (PRIMAXIN) IVPB  500 mg Intravenous Q8H  . indomethacin  100 mg Rectal Once  . potassium chloride  10 mEq Intravenous Q1 Hr x 4  . sodium chloride  1,000 mL Intravenous Once  . sodium chloride  250 mL Intravenous Once  . thiamine  100 mg Intravenous Daily   Infusion[s]: Infusions:  . sodium chloride 125 mL/hr at 10/07/14 2248  . lactated ringers     Antibiotic[s]: Anti-infectives    Start     Dose/Rate Route Frequency Ordered Stop   10/08/14 1300  imipenem-cilastatin (PRIMAXIN) 500 mg in sodium chloride 0.9 % 100 mL IVPB     500 mg 200 mL/hr over 30 Minutes  Intravenous Every 8 hours 10/08/14 1250     10/07/14 0400  ciprofloxacin (CIPRO) IVPB 400 mg  Status:  Discontinued     400 mg 200 mL/hr over 60 Minutes Intravenous Every 12 hours 10/06/14 1700 10/08/14 1237   10/06/14 1630  metroNIDAZOLE (FLAGYL) IVPB 500 mg  Status:  Discontinued     500 mg 100 mL/hr over 60 Minutes Intravenous Every 8 hours 10/06/14 1625 10/08/14 1237   10/06/14 1545  ciprofloxacin (CIPRO) IVPB 400 mg     400 mg 200 mL/hr over 60 Minutes Intravenous  Once 10/06/14 1533 10/06/14 1652   10/06/14 1545  metroNIDAZOLE (FLAGYL) IVPB 500 mg  Status:  Discontinued     500 mg 100 mL/hr over 60 Minutes Intravenous  Once 10/06/14 1533 10/06/14 1629     Assessment:  39 y/o male on Cipro and Flagyl for presume Bile Duct infection.  Planned ERCP postponed this AM due to hypokalemia.    Patient Hypotensive with Diastolic pressures in the 40's-50's.    Code Sepsis called with patient to receive Primaxin.  Physician is aware of Penicillin allergy and wants to proceed.  HIV ab Reactive.  WBC 5,  Afebrile.  Goal of Therapy:  Primaxin dosed for clinical indication and adjusted for renal function.  Plan:  1. Begin Primaxin 500 mg IV q 8 hours.  First dose stat. 2. Monitor renal function, WBC, fever curve, cultures/sensitivities, and clinical progression. 3. FU HIV status. Valinda Party. Zeah Germano, Elisha HeadlandEarle J,  Pharm.D,    5/1/20161:08 PM

## 2014-10-08 NOTE — Progress Notes (Signed)
Spoke with patient vis interpreter. Will sign consent in am when friend comes to see him.

## 2014-10-08 NOTE — Consult Note (Addendum)
[facility]  Total days of antibiotics 3        Day 1 imipenem        Day 3 metronidazole               Reason for Consult: newly diagnosed HIV disease    Referring Physician: Conley Canal  Principal Problem:   Choledocholithiasis Active Problems:   Hypokalemia   Acute renal failure   Abnormal transaminases   Metabolic acidosis   Abnormal CT of the abdomen with biliary ductal dilitation   Diarrhea   Hyponatremia   Cholelithiasis   Protein-calorie malnutrition, severe   Intrahepatic bile duct stones   Hypotension   HIV antibody positive   Sepsis    HPI: Jon Love is a 39 y.o. male originally from the [place], who has resided in the [place] for the past 10 years.  He is spanish speaking only, who presents with recurrent abdominal pain associated with nausea/vomiting and watery diarrhea. He reports 15lb weight loss. He had previous ED visit in mid April for similar presentation. He was found to be dehydrated with cr 1.97 and imaging suggesting choledocholithiasis with  Mild diffuse intrahepatic and extrahepatic biliary dilatation with possible choledocholithiasis and cholelithiasis. He was empirically started on cipro and metronidazole but clinically worsened in the last 24hrs requiring fluid rescucitation due to hypotension. His ERCP procedure postponed til tomorrow until clinically stable. His abtx changed to imipenem. Initially HIV screening was positive, negative for hepatitis. ID consulted for helping patient getting into care. 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    History reviewed. No pertinent past medical history.  Allergies:  Allergies  Allergen Reactions  . Penicillins     "hives"   MEDICATIONS: . feeding supplement (RESOURCE BREEZE)  1 Container Oral TID BM  . folic acid  1 mg Intravenous Daily  . imipenem-cilastatin (PRIMAXIN) IVPB  500 mg  Intravenous Q8H  . indomethacin  100 mg Rectal Once  . phytonadione  10 mg Subcutaneous Q24H  . sodium chloride  250 mL Intravenous Once  . thiamine  100 mg Intravenous Daily    History  Substance Use Topics  . Smoking status: Never Smoker   . Smokeless tobacco: Not on file  . Alcohol Use: 0.0 oz/week    0 Standard drinks or equivalent per week    History reviewed. No pertinent family history.  Review of Systems  Constitutional: Negative for fever, chills, diaphoresis, activity change, appetite change, fatigue and  + unexpected weight change.  HENT: Negative for congestion, sore throat, rhinorrhea, sneezing, trouble swallowing and sinus pressure.  Eyes: Negative for photophobia and visual disturbance.  Respiratory: Negative for cough, chest tightness, shortness of breath, wheezing and stridor.  Cardiovascular: Negative for chest pain, palpitations and leg swelling.  Gastrointestinal: positive for nausea, vomiting, abdominal pain, diarrhea, constipation, blood in stool, abdominal distention and anal bleeding.  Genitourinary: Negative for dysuria, hematuria, flank pain and difficulty urinating.  Musculoskeletal: Negative for myalgias, back pain, joint swelling, arthralgias and gait problem.  Skin: Negative for color change, pallor, rash and wound.  Neurological: Negative for dizziness, tremors, weakness and light-headedness.  Hematological: Negative for adenopathy. Does not bruise/bleed easily.  Psychiatric/Behavioral: Negative for behavioral problems, confusion, sleep disturbance, dysphoric mood, decreased concentration and agitation.     OBJECTIVE: Temp:  [97.4 F (36.3 C)-97.8 F (36.6 C)] 97.5 F (36.4 C) (05/01 1500) Pulse Rate:  [  57-76] 63 (05/01 1500) Resp:  [11-17] 14 (05/01 1500) BP: (76-95)/(44-62) 87/53 mmHg (05/01 1500) SpO2:  [98 %-100 %] 100 % (05/01 1500) Weight:  [127 lb 3.3 oz (57.7 kg)-127 lb 6.4 oz (57.788 kg)] 127 lb 3.3 oz (57.7 kg) (05/01 1500) Physical  Exam  Constitutional: He is oriented to person, place, and time. He appears well-developed and well-nourished. No distress. Appears stated age HENT: mild bitemporal wasting, no scleral icterus Mouth/Throat: Oropharynx is clear and moist. No oropharyngeal exudate.  Cardiovascular: Normal rate, regular rhythm and normal heart sounds. Exam reveals no gallop and no friction rub.  No murmur heard.  Pulmonary/Chest: Effort normal and breath sounds normal. No respiratory distress. He has no wheezes.  Abdominal: Soft. Bowel sounds are normal. He exhibits no distension. There is no tenderness.  Lymphadenopathy:  He has no cervical adenopathy.  Neurological: He is alert and oriented to person, place, and time.  Skin: Skin is warm and dry. No rash noted. No erythema. Excoriation on legs but well healed Psychiatric: He has a normal mood and affect. His behavior is normal.     LABS: Results for orders placed or performed during the hospital encounter of 10/06/14 (from the past 48 hour(s))  Basic metabolic panel     Status: Abnormal   Collection Time: 10/06/14  5:00 PM  Result Value Ref Range   Sodium 127 (L) 135 - 145 mmol/L   Potassium 2.7 (LL) 3.5 - 5.1 mmol/L    Comment: REPEATED TO VERIFY CRITICAL RESULT CALLED TO, READ BACK BY AND VERIFIED WITH: C DOSS,RN 1735 10/06/14 WBOND    Chloride 100 96 - 112 mmol/L   CO2 13 (L) 19 - 32 mmol/L   Glucose, Bld 149 (H) 70 - 99 mg/dL   BUN 32 (H) 6 - 23 mg/dL   Creatinine, Ser 1.33 0.50 - 1.35 mg/dL   Calcium 8.2 (L) 8.4 - 10.5 mg/dL   GFR calc non Af Amer 66 (L) >90 mL/min   GFR calc Af Amer 77 (L) >90 mL/min    Comment: (NOTE) The eGFR has been calculated using the CKD EPI equation. This calculation has not been validated in all clinical situations. eGFR's persistently <90 mL/min signify possible Chronic Kidney Disease.    Anion gap 14 5 - 15  HIV antibody     Status: Abnormal   Collection Time: 10/06/14  5:00 PM  Result Value Ref Range    HIV Screen 4th Generation wRfx Reactive (A) Non Reactive    Comment: (NOTE) Performed At: [facility] [place], [place] [id number] Lindon Romp MD [id number]   HIV 1/2 Ab Differentiation     Status: Abnormal   Collection Time: 10/06/14  5:00 PM  Result Value Ref Range   HIV 1 AB Reactive (A) Non Reactive   HIV 2 AB Non Reactive Non Reactive   Note Comment     Comment: (NOTE) CLSI algorithm suggests initiation of care and follow-up for clinical staging (CLSI M53-A) when screening and one or both supplemental tests are reactive. Performed At: [facility] [place], [place] [id number] Lindon Romp MD [id number]   MRSA PCR Screening     Status: None   Collection Time: 10/06/14  6:58 PM  Result Value Ref Range   MRSA by PCR NEGATIVE NEGATIVE    Comment:        The GeneXpert MRSA Assay (FDA approved for NASAL specimens only), is one component of a comprehensive MRSA colonization surveillance program.  It is not intended to diagnose MRSA infection nor to guide or monitor treatment for MRSA infections.   Lactic acid, plasma     Status: None   Collection Time: 10/06/14  8:21 PM  Result Value Ref Range   Lactic Acid, Venous 1.3 0.5 - 2.0 mmol/L  Basic metabolic panel     Status: Abnormal   Collection Time: 10/07/14 12:15 AM  Result Value Ref Range   Sodium 135 135 - 145 mmol/L    Comment: DELTA CHECK NOTED   Potassium 3.1 (L) 3.5 - 5.1 mmol/L   Chloride 110 96 - 112 mmol/L   CO2 14 (L) 19 - 32 mmol/L   Glucose, Bld 84 70 - 99 mg/dL   BUN 27 (H) 6 - 23 mg/dL   Creatinine, Ser 1.17 0.50 - 1.35 mg/dL   Calcium 7.9 (L) 8.4 - 10.5 mg/dL   GFR calc non Af Amer 78 (L) >90 mL/min   GFR calc Af Amer 90 (L) >90 mL/min    Comment: (NOTE) The eGFR has been calculated using the CKD EPI equation. This calculation has not been validated in all clinical situations. eGFR's persistently <90 mL/min signify possible Chronic  Kidney Disease.    Anion gap 11 5 - 15  Comprehensive metabolic panel     Status: Abnormal   Collection Time: 10/07/14  3:02 AM  Result Value Ref Range   Sodium 135 135 - 145 mmol/L   Potassium 3.3 (L) 3.5 - 5.1 mmol/L   Chloride 112 96 - 112 mmol/L   CO2 12 (L) 19 - 32 mmol/L   Glucose, Bld 80 70 - 99 mg/dL   BUN 26 (H) 6 - 23 mg/dL   Creatinine, Ser 1.14 0.50 - 1.35 mg/dL   Calcium 8.0 (L) 8.4 - 10.5 mg/dL   Total Protein 5.5 (L) 6.0 - 8.3 g/dL   Albumin 2.5 (L) 3.5 - 5.2 g/dL   AST 47 (H) 0 - 37 U/L   ALT 35 0 - 53 U/L   Alkaline Phosphatase 89 39 - 117 U/L   Total Bilirubin 1.0 0.3 - 1.2 mg/dL   GFR calc non Af Amer 80 (L) >90 mL/min   GFR calc Af Amer >90 >90 mL/min    Comment: (NOTE) The eGFR has been calculated using the CKD EPI equation. This calculation has not been validated in all clinical situations. eGFR's persistently <90 mL/min signify possible Chronic Kidney Disease.    Anion gap 11 5 - 15  CBC     Status: Abnormal   Collection Time: 10/07/14  3:02 AM  Result Value Ref Range   WBC 5.0 4.0 - 10.5 K/uL   RBC 4.38 4.22 - 5.81 MIL/uL   Hemoglobin 11.3 (L) 13.0 - 17.0 g/dL    Comment: REPEATED TO VERIFY   HCT 31.6 (L) 39.0 - 52.0 %   MCV 72.1 (L) 78.0 - 100.0 fL   MCH 25.8 (L) 26.0 - 34.0 pg   MCHC 35.8 30.0 - 36.0 g/dL   RDW 14.3 11.5 - 15.5 %   Platelets 192 150 - 400 K/uL    Comment: REPEATED TO VERIFY  Hepatitis panel, acute     Status: None   Collection Time: 10/07/14  4:40 PM  Result Value Ref Range   Hepatitis B Surface Ag NEGATIVE NEGATIVE   HCV Ab NEGATIVE NEGATIVE   Hep A IgM NON REACTIVE NON REACTIVE    Comment: (NOTE) Effective April 24, 2014, Hepatitis Acute Panel (test code [phone]) will be revised  to automatically reflex to the Hepatitis C Viral RNA, Quantitative, Real-Time PCR assay if the Hepatitis C antibody screening result is Reactive. This action is being taken to ensure that the CDC/USPSTF recommended HCV diagnostic algorithm  with the appropriate test reflex needed for accurate interpretation is followed.    Hep B C IgM NON REACTIVE NON REACTIVE    Comment: (NOTE) High levels of Hepatitis B Core IgM antibody are detectable during the acute stage of Hepatitis B. This antibody is used to differentiate current from past HBV infection. Performed at [facility]   Magnesium     Status: None   Collection Time: 10/07/14  4:40 PM  Result Value Ref Range   Magnesium 1.8 1.5 - 2.5 mg/dL  Basic metabolic panel     Status: Abnormal   Collection Time: 10/08/14  7:40 AM  Result Value Ref Range   Sodium 136 135 - 145 mmol/L   Potassium 2.8 (L) 3.5 - 5.1 mmol/L   Chloride 118 (H) 101 - 111 mmol/L   CO2 11 (L) 22 - 32 mmol/L   Glucose, Bld 84 70 - 99 mg/dL   BUN 11 6 - 20 mg/dL   Creatinine, Ser 1.11 0.61 - 1.24 mg/dL   Calcium 8.1 (L) 8.9 - 10.3 mg/dL   GFR calc non Af Amer >60 >60 mL/min   GFR calc Af Amer >60 >60 mL/min    Comment: (NOTE) The eGFR has been calculated using the CKD EPI equation. This calculation has not been validated in all clinical situations. eGFR's persistently <90 mL/min signify possible Chronic Kidney Disease.    Anion gap 7 5 - 15  Clostridium Difficile by PCR     Status: None   Collection Time: 10/08/14  9:00 AM  Result Value Ref Range   C difficile by pcr NEGATIVE NEGATIVE  Lactic acid, plasma     Status: None   Collection Time: 10/08/14  2:15 PM  Result Value Ref Range   Lactic Acid, Venous 1.6 0.5 - 2.0 mmol/L  Procalcitonin     Status: None   Collection Time: 10/08/14  2:15 PM  Result Value Ref Range   Procalcitonin <0.10 ng/mL    Comment:        Interpretation: PCT (Procalcitonin) <= 0.5 ng/mL: Systemic infection (sepsis) is not likely. Local bacterial infection is possible. (NOTE)         ICU PCT Algorithm               Non ICU PCT Algorithm    ----------------------------     ------------------------------         PCT < 0.25 ng/mL                 PCT < 0.1  ng/mL     Stopping of antibiotics            Stopping of antibiotics       strongly encouraged.               strongly encouraged.    ----------------------------     ------------------------------       PCT level decrease by               PCT < 0.25 ng/mL       >= 80% from peak PCT       OR PCT 0.25 - 0.5 ng/mL          Stopping of antibiotics  encouraged.     Stopping of antibiotics           encouraged.    ----------------------------     ------------------------------       PCT level decrease by              PCT >= 0.25 ng/mL       < 80% from peak PCT        AND PCT >= 0.5 ng/mL            Continuin g antibiotics                                              encouraged.       Continuing antibiotics            encouraged.    ----------------------------     ------------------------------     PCT level increase compared          PCT > 0.5 ng/mL         with peak PCT AND          PCT >= 0.5 ng/mL             Escalation of antibiotics                                          strongly encouraged.      Escalation of antibiotics        strongly encouraged.   Protime-INR     Status: Abnormal   Collection Time: 10/08/14  2:15 PM  Result Value Ref Range   Prothrombin Time 26.2 (H) 11.6 - 15.2 seconds   INR 2.38 (H) 0.00 - 1.49  APTT     Status: None   Collection Time: 10/08/14  2:15 PM  Result Value Ref Range   aPTT 37 24 - 37 seconds    Comment:        IF BASELINE aPTT IS ELEVATED, SUGGEST PATIENT RISK ASSESSMENT BE USED TO DETERMINE APPROPRIATE ANTICOAGULANT THERAPY.   Basic metabolic panel     Status: Abnormal   Collection Time: 10/08/14  2:15 PM  Result Value Ref Range   Sodium 140 135 - 145 mmol/L   Potassium 3.4 (L) 3.5 - 5.1 mmol/L    Comment: DELTA CHECK NOTED   Chloride 123 (H) 101 - 111 mmol/L   CO2 13 (L) 22 - 32 mmol/L   Glucose, Bld 79 70 - 99 mg/dL   BUN 9 6 - 20 mg/dL   Creatinine, Ser 1.00 0.61 - 1.24 mg/dL    Calcium 7.3 (L) 8.9 - 10.3 mg/dL   GFR calc non Af Amer >60 >60 mL/min   GFR calc Af Amer >60 >60 mL/min    Comment: (NOTE) The eGFR has been calculated using the CKD EPI equation. This calculation has not been validated in all clinical situations. eGFR's persistently <90 mL/min signify possible Chronic Kidney Disease.    Anion gap 4 (L) 5 - 15    MICRO:  IMAGING: Mr 3d Recon At Scanner  10/07/2014   CLINICAL DATA:  Right upper quadrant abdominal pain. Nausea and vomiting, and diarrhea. Biliary dilatation and possible choledocholithiasis seen on recent CT.  EXAM: MRI ABDOMEN WITHOUT AND WITH CONTRAST (INCLUDING MRCP)  TECHNIQUE: Multiplanar multisequence  MR imaging of the abdomen was performed both before and after the administration of intravenous contrast. Heavily T2-weighted images of the biliary and pancreatic ducts were obtained, and three-dimensional MRCP images were rendered by post processing.  CONTRAST:  12 mL MultiHance  COMPARISON:  CT on 10/06/2014  FINDINGS: Lower chest:  Unremarkable.  Hepatobiliary: No liver masses are identified. Gallbladder is distended and contains several small gallstones, however there is no evidence of gallbladder wall thickening or pericholecystic inflammatory changes.  Mild diffuse biliary ductal dilatation is demonstrated with common bile duct measuring 8 mm. Two or 3 small less than 1 cm calculi are seen in the distal common bile duct.  Pancreas: No masses, inflammatory changes, or fluid collections demonstrated. No evidence pancreatic ductal dilatation or pancreas divisum.  Spleen:  Within normal limits in size and appearance.  Adrenal Glands:  No masses identified.  Kidneys: No renal masses identified. No evidence of hydronephrosis. Tiny sub-cm cyst is seen in the anterior upper pole of the left kidney.  Stomach/Bowel/Peritoneum: No evidence of wall thickening, mass, or obstruction involving visualized abdominal bowel.  Vascular/Lymphatic: No pathologically  enlarged lymph nodes identified. No other significant abnormality noted.  Other:  None.  Musculoskeletal:  No suspicious bone lesions identified.  IMPRESSION: Cholelithiasis, without definite radiographic signs of acute cholecystitis.  Diffuse biliary ductal dilatation secondary to choledocholithiasis; 2 or 3 small less than 1 cm calculi in the distal common bile duct.   Electronically Signed   By: Earle Gell M.D.   On: 10/07/2014 09:29   Mr Abd W/wo Cm/mrcp  10/07/2014   CLINICAL DATA:  Right upper quadrant abdominal pain. Nausea and vomiting, and diarrhea. Biliary dilatation and possible choledocholithiasis seen on recent CT.  EXAM: MRI ABDOMEN WITHOUT AND WITH CONTRAST (INCLUDING MRCP)  TECHNIQUE: Multiplanar multisequence MR imaging of the abdomen was performed both before and after the administration of intravenous contrast. Heavily T2-weighted images of the biliary and pancreatic ducts were obtained, and three-dimensional MRCP images were rendered by post processing.  CONTRAST:  12 mL MultiHance  COMPARISON:  CT on 10/06/2014  FINDINGS: Lower chest:  Unremarkable.  Hepatobiliary: No liver masses are identified. Gallbladder is distended and contains several small gallstones, however there is no evidence of gallbladder wall thickening or pericholecystic inflammatory changes.  Mild diffuse biliary ductal dilatation is demonstrated with common bile duct measuring 8 mm. Two or 3 small less than 1 cm calculi are seen in the distal common bile duct.  Pancreas: No masses, inflammatory changes, or fluid collections demonstrated. No evidence pancreatic ductal dilatation or pancreas divisum.  Spleen:  Within normal limits in size and appearance.  Adrenal Glands:  No masses identified.  Kidneys: No renal masses identified. No evidence of hydronephrosis. Tiny sub-cm cyst is seen in the anterior upper pole of the left kidney.  Stomach/Bowel/Peritoneum: No evidence of wall thickening, mass, or obstruction involving  visualized abdominal bowel.  Vascular/Lymphatic: No pathologically enlarged lymph nodes identified. No other significant abnormality noted.  Other:  None.  Musculoskeletal:  No suspicious bone lesions identified.  IMPRESSION: Cholelithiasis, without definite radiographic signs of acute cholecystitis.  Diffuse biliary ductal dilatation secondary to choledocholithiasis; 2 or 3 small less than 1 cm calculi in the distal common bile duct.   Electronically Signed   By: Earle Gell M.D.   On: 10/07/2014 09:29    HISTORICAL MICRO/IMAGING  Assessment/Plan:  39yo M spanish speaker with newly diagnosed hiv disease, presents with n/v/abd pain/watery diarrhea/weight loss and possibly choledocolithiasis -sepsis related  hiv  disease = will need cd 4 coutn ,viral load, genotype, hla [id number], rpr, quantiferon in order to determine what to start for hiv disease; i have discussed how he would get care through our clinic, [facility] after hospitalization  Diarrhea = would check for cryptosporidium as possible cause of his symptoms as well as giardia. Recommend to get EIA/elisa test as quicker turnaround than gi pcr testing.   Sepsis/choledocolithiasis = can keep on imipenem for now but would de-escalate in 48hrs or guide therapy based upon cx results  Weight loss = could purely be due to diarrhea. Once he is able to eat, consider pre-albumin and caloric intake record x 72hr.  aki = improved with hydration  Spent 60 min with patient in face to face communication and counseling via interpreter phone

## 2014-10-08 NOTE — Progress Notes (Signed)
BP remains low. Rapid Response nurse, Angelique Blonderenise at bedside. Pressures still low. Starting 4th liter of Normal saline. MD aware. Will continue to monitor.    10/08/14 1250 10/08/14 1257 10/08/14 1308  Vitals  BP (!) 86/53 mmHg (!) 82/50 mmHg (!) 80/53 mmHg  BP Location Left Arm Left Arm Right Arm  BP Method Automatic Automatic Automatic  Patient Position (if appropriate) Lying Lying Lying  Pulse Rate --  --  --      10/08/14 1318  Vitals  BP (!) 83/50 mmHg  BP Location Right Arm  BP Method Automatic  Patient Position (if appropriate) Lying  Pulse Rate 65

## 2014-10-08 NOTE — Progress Notes (Signed)
[facility] PROGRESS NOTE  Jon Love WVP:[id number] DOB: 1976-01-18 DOA: 10/06/2014 PCP: No PCP Per Patient   summary: 39 yo Spanish speaking male presents with diarrhea and abdominal pain. Found to have cholelithiasis, biliary ductal dilatation concerning for choledocholithiasis. Charted on empiric Cipro and Flagyl. Surgery and GI consultation in the emergency room. Stool studies ordered, MRCP shows common duct stone Without evidence of cholecystitis.  Assessment/Plan: Choledocholithiasis: MRCP showing cervical common duct stones. Patient is going for ERCP this morning. Continue empiric antibiotics. Discussed procedure with interpreter phone and answered any questions.  Active Problems:  hypotension: Patient will get a fluid bolus prior to going down to endoscopy.   Hypokalemia continues secondary to diarrhea. Will replete IV. Magnesium normal. Monitor.   HIV antibody positive: Patient is going down for ERCP now. Will discuss results with patient. Discussed with Baxter Flattery. If new diagnosis, will need CD4 count, HIV viral load, genotype, HLA [id number].  Dr. Baxter Flattery will consult but I've asked her to wait until I discussed results with patient.   Acute renal failure:  Improved. Secondary to prerenal azotemia. Continue IV fluids.   Abnormal transaminases, secondary to choledocholithiasis. Achy hepatitis panel negative.    Metabolic acidosis secondary to diarrhea. Lactate normal. Monitor.   Diarrhea: continues. GI pathogen panel pending.    Hyponatremia, hypovolemic, resolved with IV hydration   Cholelithiasis:  Surgery following. will need eventual cholecystectomy   Code Status:  full Family Communication:   discussed with patient via interpreter found. No family present. Patient is alert and oriented.  Disposition Plan:  Home once medically stable.   Consultants:  GI: Thayer Ohm. Surgery  ID  Procedures:     Antibiotics: Cipro, Flagyl 4/29 --  HPI/Subjective: No pain. No  vomiting. Still with some loose stool, about 3-4 since yesterday. Feels a little dizzy with standing.   Objective: Filed Vitals:   10/08/14 0917  BP: 84/52  Pulse:   Temp:   Resp:     Intake/Output Summary (Last 24 hours) at 10/08/14 1010 Last data filed at 10/08/14 0600  Gross per 24 hour  Intake   3335 ml  Output    400 ml  Net   2935 ml   Filed Weights   10/06/14 1849 10/07/14 2102  Weight: 56.1 kg (123 lb 10.9 oz) 57.788 kg (127 lb 6.4 oz)    Exam:   General:  Nontoxic appearing. Pleasant cooperative.  Cardiovascular: Regular rate rhythm without murmurs gallops rubs  Respiratory: Clear to auscultation bilaterally without wheezes rhonchi or rales  Abdomen: Soft nontender nondistended  Ext: No clubbing cyanosis or edema  Basic Metabolic Panel:  Recent Labs Lab 10/06/14 1159 10/06/14 1700 10/07/14 0015 10/07/14 0302 10/07/14 1640 10/08/14 0740  NA 130* 127* 135 135  --  136  K 2.5* 2.7* 3.1* 3.3*  --  2.8*  CL 98 100 110 112  --  118*  CO2 17* 13* 14* 12*  --  11*  GLUCOSE 154* 149* 84 80  --  84  BUN 35* 32* 27* 26*  --  11  CREATININE 1.97* 1.33 1.17 1.14  --  1.11  CALCIUM 8.8 8.2* 7.9* 8.0*  --  8.1*  MG  --   --   --   --  1.8  --    Liver Function Tests:  Recent Labs Lab 10/06/14 1159 10/07/14 0302  AST 66* 47*  ALT 50 35  ALKPHOS 134* 89  BILITOT 1.8* 1.0  PROT 8.1 5.5*  ALBUMIN 3.7 2.5*  Recent Labs Lab 10/06/14 1159  LIPASE 36   No results for input(s): AMMONIA in the last 168 hours. CBC:  Recent Labs Lab 10/06/14 1159 10/07/14 0302  WBC 6.4 5.0  NEUTROABS 4.2  --   HGB 15.5 11.3*  HCT 43.4 31.6*  MCV 73.1* 72.1*  PLT 302 192   Cardiac Enzymes: No results for input(s): CKTOTAL, CKMB, CKMBINDEX, TROPONINI in the last 168 hours. BNP (last 3 results) No results for input(s): BNP in the last 8760 hours.  ProBNP (last 3 results) No results for input(s): PROBNP in the last 8760 hours.  CBG: No results for  input(s): GLUCAP in the last 168 hours.  Recent Results (from the past 240 hour(s))  Culture, blood (routine x 2)     Status: None (Preliminary result)   Collection Time: 10/06/14  3:39 PM  Result Value Ref Range Status   Specimen Description BLOOD RIGHT WRIST  Final   Special Requests BOTTLES DRAWN AEROBIC AND ANAEROBIC 5CC  Final   Culture   Final           BLOOD CULTURE RECEIVED NO GROWTH TO DATE CULTURE WILL BE HELD FOR 5 DAYS BEFORE ISSUING A FINAL NEGATIVE REPORT Performed at [facility]    Report Status PENDING  Incomplete  Culture, blood (routine x 2)     Status: None (Preliminary result)   Collection Time: 10/06/14  3:43 PM  Result Value Ref Range Status   Specimen Description BLOOD RIGHT ARM  Final   Special Requests BOTTLES DRAWN AEROBIC AND ANAEROBIC 5CC  Final   Culture   Final           BLOOD CULTURE RECEIVED NO GROWTH TO DATE CULTURE WILL BE HELD FOR 5 DAYS BEFORE ISSUING A FINAL NEGATIVE REPORT Performed at [facility]    Report Status PENDING  Incomplete  MRSA PCR Screening     Status: None   Collection Time: 10/06/14  6:58 PM  Result Value Ref Range Status   MRSA by PCR NEGATIVE NEGATIVE Final    Comment:        The GeneXpert MRSA Assay (FDA approved for NASAL specimens only), is one component of a comprehensive MRSA colonization surveillance program. It is not intended to diagnose MRSA infection nor to guide or monitor treatment for MRSA infections.      Studies: Ct Abdomen Pelvis W Contrast  10/06/2014   CLINICAL DATA:  Lower abdominal pain for 2 weeks. Nausea and diarrhea. Headache.  EXAM: CT ABDOMEN AND PELVIS WITH CONTRAST  TECHNIQUE: Multidetector CT imaging of the abdomen and pelvis was performed using the standard protocol following bolus administration of intravenous contrast.  CONTRAST:  127m OMNIPAQUE IOHEXOL 300 MG/ML  SOLN  COMPARISON:  None.  FINDINGS: Visualized lung bases are clear allowing for mild motion artifact. No  pleural effusion.  There is mild, diffuse intrahepatic biliary dilatation. There is also mild extrahepatic biliary dilatation, with the common bile duct measuring 9 mm in diameter. There is suggestion of a small amount of soft tissue density in the distal aspect of the common bile duct (series 2, images 37 and 38), and a similar 10 mm focus is present dependently in the gallbladder, possibly reflecting gallstones. Gallbladder is moderately distended without gross wall thickening. Cystic duct also appears mildly to moderately distended. There is no pancreatic ductal dilatation, and no gross pancreatic or ampullary mass is identified.  3 mm hypodensity is noted in the right hepatic dome. The spleen, adrenal glands, right kidney,  and pancreas have an unremarkable enhanced appearance. 3 mm hypodensity in the upper pole of the left kidney is too small to fully characterize but likely represents a cyst.  Oral contrast is present throughout the small bowel. There is no evidence of bowel obstruction. Liquid stool is present throughout the colon, consistent with history of recent diarrhea. Appendix is identified in the right lower quadrant/ upper pelvis and is unremarkable.  Bladder is unremarkable. No free fluid or enlarged lymph nodes are identified. No acute osseous abnormality is seen.  IMPRESSION: Mild diffuse intrahepatic and extrahepatic biliary dilatation with possible choledocholithiasis and cholelithiasis. Consider further evaluation with ERCP or MRCP. Other considerations might include a biliary stricture or cholangitis.   Electronically Signed   By: Logan Bores   On: 10/06/2014 13:09   Mr 3d Recon At Scanner  10/07/2014   CLINICAL DATA:  Right upper quadrant abdominal pain. Nausea and vomiting, and diarrhea. Biliary dilatation and possible choledocholithiasis seen on recent CT.  EXAM: MRI ABDOMEN WITHOUT AND WITH CONTRAST (INCLUDING MRCP)  TECHNIQUE: Multiplanar multisequence MR imaging of the abdomen was  performed both before and after the administration of intravenous contrast. Heavily T2-weighted images of the biliary and pancreatic ducts were obtained, and three-dimensional MRCP images were rendered by post processing.  CONTRAST:  12 mL MultiHance  COMPARISON:  CT on 10/06/2014  FINDINGS: Lower chest:  Unremarkable.  Hepatobiliary: No liver masses are identified. Gallbladder is distended and contains several small gallstones, however there is no evidence of gallbladder wall thickening or pericholecystic inflammatory changes.  Mild diffuse biliary ductal dilatation is demonstrated with common bile duct measuring 8 mm. Two or 3 small less than 1 cm calculi are seen in the distal common bile duct.  Pancreas: No masses, inflammatory changes, or fluid collections demonstrated. No evidence pancreatic ductal dilatation or pancreas divisum.  Spleen:  Within normal limits in size and appearance.  Adrenal Glands:  No masses identified.  Kidneys: No renal masses identified. No evidence of hydronephrosis. Tiny sub-cm cyst is seen in the anterior upper pole of the left kidney.  Stomach/Bowel/Peritoneum: No evidence of wall thickening, mass, or obstruction involving visualized abdominal bowel.  Vascular/Lymphatic: No pathologically enlarged lymph nodes identified. No other significant abnormality noted.  Other:  None.  Musculoskeletal:  No suspicious bone lesions identified.  IMPRESSION: Cholelithiasis, without definite radiographic signs of acute cholecystitis.  Diffuse biliary ductal dilatation secondary to choledocholithiasis; 2 or 3 small less than 1 cm calculi in the distal common bile duct.   Electronically Signed   By: Earle Gell M.D.   On: 10/07/2014 09:29   Mr Abd W/wo Cm/mrcp  10/07/2014   CLINICAL DATA:  Right upper quadrant abdominal pain. Nausea and vomiting, and diarrhea. Biliary dilatation and possible choledocholithiasis seen on recent CT.  EXAM: MRI ABDOMEN WITHOUT AND WITH CONTRAST (INCLUDING MRCP)   TECHNIQUE: Multiplanar multisequence MR imaging of the abdomen was performed both before and after the administration of intravenous contrast. Heavily T2-weighted images of the biliary and pancreatic ducts were obtained, and three-dimensional MRCP images were rendered by post processing.  CONTRAST:  12 mL MultiHance  COMPARISON:  CT on 10/06/2014  FINDINGS: Lower chest:  Unremarkable.  Hepatobiliary: No liver masses are identified. Gallbladder is distended and contains several small gallstones, however there is no evidence of gallbladder wall thickening or pericholecystic inflammatory changes.  Mild diffuse biliary ductal dilatation is demonstrated with common bile duct measuring 8 mm. Two or 3 small less than 1 cm calculi are seen in  the distal common bile duct.  Pancreas: No masses, inflammatory changes, or fluid collections demonstrated. No evidence pancreatic ductal dilatation or pancreas divisum.  Spleen:  Within normal limits in size and appearance.  Adrenal Glands:  No masses identified.  Kidneys: No renal masses identified. No evidence of hydronephrosis. Tiny sub-cm cyst is seen in the anterior upper pole of the left kidney.  Stomach/Bowel/Peritoneum: No evidence of wall thickening, mass, or obstruction involving visualized abdominal bowel.  Vascular/Lymphatic: No pathologically enlarged lymph nodes identified. No other significant abnormality noted.  Other:  None.  Musculoskeletal:  No suspicious bone lesions identified.  IMPRESSION: Cholelithiasis, without definite radiographic signs of acute cholecystitis.  Diffuse biliary ductal dilatation secondary to choledocholithiasis; 2 or 3 small less than 1 cm calculi in the distal common bile duct.   Electronically Signed   By: Earle Gell M.D.   On: 10/07/2014 09:29    Scheduled Meds: . ciprofloxacin  400 mg Intravenous Q12H  . feeding supplement (RESOURCE BREEZE)  1 Container Oral TID BM  . folic acid  1 mg Intravenous Daily  . indomethacin  100 mg  Rectal Once  . metronidazole  500 mg Intravenous Q8H  . potassium chloride  10 mEq Intravenous Q1 Hr x 4  . sodium chloride  250 mL Intravenous Once  . thiamine  100 mg Intravenous Daily   Continuous Infusions: . sodium chloride 125 mL/hr at 10/07/14 2248    Time spent: 35 minutes  [facility] [facility] Pager [phone]. If 7PM-7AM, please contact night-coverage at [email or web address], password [facility] 10/08/2014, 10:10 AM  LOS: 2 days

## 2014-10-08 NOTE — Consult Note (Signed)
PULMONARY / CRITICAL CARE MEDICINE   Name: Jon Love MRN: [id number] DOB: [birth year]    ADMISSION DATE:  10/06/2014 CONSULTATION DATE:  Aida Raider  REFERRING MD :  Lendell Caprice ([facility])   CHIEF COMPLAINT:  Hypotension, sepsis   INITIAL PRESENTATION:  39 yo spanish speaking male with new dx HIV (this admit), otherwise no known PMH, admitted 4/29 with diarrhea and abd pain.  Found to have choledocholithiasis and treated with IV abx, followed closely by GI.  Was awaiting ERCP/sphincterotomy which was postponed r/t hypokalemia.  On 5/1 had progressive hypotension, refractory to IV fluids and PCCM consulted for ICU tx.   STUDIES:  CT abd 4/29>>> Mild diffuse intrahepatic and extrahepatic biliary dilatation with possible choledocholithiasis and cholelithiasis MRCP abd 4/30>>> Cholelithiasis, without definite radiographic signs of acute cholecystitis. Diffuse biliary ductal dilatation secondary to choledocholithiasis; 2 or 3 small less than 1 cm calculi in the distal common bile duct.   SIGNIFICANT EVENTS: 5/1 persistent hypotension, tx ICU   HISTORY OF PRESENT ILLNESS:  39 yo male with new dx HIV (this admit), otherwise no known PMH, admitted 4/29 with diarrhea and abd pain.  Found to have choledocholithiasis and treated with IV abx, followed closely by GI.  Was awaiting ERCP/sphincterotomy which was postponed r/t hypokalemia.  On 5/1 had progressive hypotension, refractory to IV fluids and PCCM consulted for ICU tx.  Currently c/o mild abd pain.  Denies nausea/vomiting, lightheadedness, chest pain, SOB.    PAST MEDICAL HISTORY :   has no past medical history on file.  has no past surgical history on file. Prior to Admission medications   Medication Sig Start Date End Date Taking? Authorizing Provider  Multiple Vitamin (MULTIVITAMIN) tablet Take 1 tablet by mouth daily.   Yes Historical Provider, MD  omeprazole (PRILOSEC) 20 MG capsule Take 1 capsule (20 mg total) by mouth daily. Patient not  taking: Reported on 10/06/2014 09/17/14   Elvina Sidle, MD  promethazine (PHENERGAN) 12.5 MG tablet Take 1 tablet (12.5 mg total) by mouth every 8 (eight) hours as needed for nausea or vomiting. Patient not taking: Reported on 10/06/2014 09/17/14   Elvina Sidle, MD   Allergies  Allergen Reactions  . Penicillins     "hives"    FAMILY HISTORY:  has no family status information on file.  SOCIAL HISTORY:  reports that he has never smoked. He does not have any smokeless tobacco history on file. He reports that he drinks alcohol.  REVIEW OF SYSTEMS:  As per HPI - All other systems reviewed and were neg.    SUBJECTIVE:   VITAL SIGNS: Temp:  [97.4 F (36.3 C)-97.8 F (36.6 C)] 97.5 F (36.4 C) (05/01 1500) Pulse Rate:  [57-76] 63 (05/01 1500) Resp:  [11-17] 14 (05/01 1500) BP: (76-95)/(44-62) 87/53 mmHg (05/01 1500) SpO2:  [98 %-100 %] 100 % (05/01 1500) Weight:  [127 lb 3.3 oz (57.7 kg)-127 lb 6.4 oz (57.788 kg)] 127 lb 3.3 oz (57.7 kg) (05/01 1500)    INTAKE / OUTPUT:  Intake/Output Summary (Last 24 hours) at 10/08/14 1607 Last data filed at 10/08/14 1500  Gross per 24 hour  Intake   2995 ml  Output    700 ml  Net   2295 ml    PHYSICAL EXAMINATION: General:  Pleasant young male, spanish speaking, NAD  Neuro:  Awake, alert, appropriate, MAE HEENT:  Mm moist, no JVD Cardiovascular:  s1s2 rrr Lungs:  resps even, non labored on RA Abdomen:  Soft, mildly tender diffusely, +bs  Musculoskeletal:  Warm  and dry, no edema    LABS:  CBC  Recent Labs Lab 10/06/14 1159 10/07/14 0302  WBC 6.4 5.0  HGB 15.5 11.3*  HCT 43.4 31.6*  PLT 302 192   Coag's  Recent Labs Lab 10/08/14 1415  APTT 37  INR 2.38*   BMET  Recent Labs Lab 10/07/14 0302 10/08/14 0740 10/08/14 1415  NA 135 136 140  K 3.3* 2.8* 3.4*  CL 112 118* 123*  CO2 12* 11* 13*  BUN 26* 11 9  CREATININE 1.14 1.11 1.00  GLUCOSE 80 84 79   Electrolytes  Recent Labs Lab 10/07/14 0302  10/07/14 1640 10/08/14 0740 10/08/14 1415  CALCIUM 8.0*  --  8.1* 7.3*  MG  --  1.8  --   --    Sepsis Markers  Recent Labs Lab 10/06/14 1539 10/06/14 2021 10/08/14 1415  LATICACIDVEN 0.9 1.3 1.6  PROCALCITON  --   --  <0.10   ABG No results for input(s): PHART, PCO2ART, PO2ART in the last 168 hours. Liver Enzymes  Recent Labs Lab 10/06/14 1159 10/07/14 0302  AST 66* 47*  ALT 50 35  ALKPHOS 134* 89  BILITOT 1.8* 1.0  ALBUMIN 3.7 2.5*   Cardiac Enzymes No results for input(s): TROPONINI, PROBNP in the last 168 hours. Glucose No results for input(s): GLUCAP in the last 168 hours.  Imaging Mr 3d Recon At Scanner  10/07/2014   CLINICAL DATA:  Right upper quadrant abdominal pain. Nausea and vomiting, and diarrhea. Biliary dilatation and possible choledocholithiasis seen on recent CT.  EXAM: MRI ABDOMEN WITHOUT AND WITH CONTRAST (INCLUDING MRCP)  TECHNIQUE: Multiplanar multisequence MR imaging of the abdomen was performed both before and after the administration of intravenous contrast. Heavily T2-weighted images of the biliary and pancreatic ducts were obtained, and three-dimensional MRCP images were rendered by post processing.  CONTRAST:  12 mL MultiHance  COMPARISON:  CT on 10/06/2014  FINDINGS: Lower chest:  Unremarkable.  Hepatobiliary: No liver masses are identified. Gallbladder is distended and contains several small gallstones, however there is no evidence of gallbladder wall thickening or pericholecystic inflammatory changes.  Mild diffuse biliary ductal dilatation is demonstrated with common bile duct measuring 8 mm. Two or 3 small less than 1 cm calculi are seen in the distal common bile duct.  Pancreas: No masses, inflammatory changes, or fluid collections demonstrated. No evidence pancreatic ductal dilatation or pancreas divisum.  Spleen:  Within normal limits in size and appearance.  Adrenal Glands:  No masses identified.  Kidneys: No renal masses identified. No  evidence of hydronephrosis. Tiny sub-cm cyst is seen in the anterior upper pole of the left kidney.  Stomach/Bowel/Peritoneum: No evidence of wall thickening, mass, or obstruction involving visualized abdominal bowel.  Vascular/Lymphatic: No pathologically enlarged lymph nodes identified. No other significant abnormality noted.  Other:  None.  Musculoskeletal:  No suspicious bone lesions identified.  IMPRESSION: Cholelithiasis, without definite radiographic signs of acute cholecystitis.  Diffuse biliary ductal dilatation secondary to choledocholithiasis; 2 or 3 small less than 1 cm calculi in the distal common bile duct.   Electronically Signed   By: Myles RosenthalJohn  Stahl M.D.   On: 10/07/2014 09:29   Mr Abd W/wo Cm/mrcp  10/07/2014   CLINICAL DATA:  Right upper quadrant abdominal pain. Nausea and vomiting, and diarrhea. Biliary dilatation and possible choledocholithiasis seen on recent CT.  EXAM: MRI ABDOMEN WITHOUT AND WITH CONTRAST (INCLUDING MRCP)  TECHNIQUE: Multiplanar multisequence MR imaging of the abdomen was performed both before and after the administration  of intravenous contrast. Heavily T2-weighted images of the biliary and pancreatic ducts were obtained, and three-dimensional MRCP images were rendered by post processing.  CONTRAST:  12 mL MultiHance  COMPARISON:  CT on 10/06/2014  FINDINGS: Lower chest:  Unremarkable.  Hepatobiliary: No liver masses are identified. Gallbladder is distended and contains several small gallstones, however there is no evidence of gallbladder wall thickening or pericholecystic inflammatory changes.  Mild diffuse biliary ductal dilatation is demonstrated with common bile duct measuring 8 mm. Two or 3 small less than 1 cm calculi are seen in the distal common bile duct.  Pancreas: No masses, inflammatory changes, or fluid collections demonstrated. No evidence pancreatic ductal dilatation or pancreas divisum.  Spleen:  Within normal limits in size and appearance.  Adrenal Glands:   No masses identified.  Kidneys: No renal masses identified. No evidence of hydronephrosis. Tiny sub-cm cyst is seen in the anterior upper pole of the left kidney.  Stomach/Bowel/Peritoneum: No evidence of wall thickening, mass, or obstruction involving visualized abdominal bowel.  Vascular/Lymphatic: No pathologically enlarged lymph nodes identified. No other significant abnormality noted.  Other:  None.  Musculoskeletal:  No suspicious bone lesions identified.  IMPRESSION: Cholelithiasis, without definite radiographic signs of acute cholecystitis.  Diffuse biliary ductal dilatation secondary to choledocholithiasis; 2 or 3 small less than 1 cm calculi in the distal common bile duct.   Electronically Signed   By: Myles Rosenthal M.D.   On: 10/07/2014 09:29     ASSESSMENT / PLAN:  PULMONARY No active issue  P:   Monitor Supplemental O2 as needed   CARDIOVASCULAR Hypotension - asymptomatic.  Good mental status, good UOP.  Lactate, pct neg.  SIRS  P:  Cont gentle volume    RENAL AKI - improved.  Hyperchloremia - r/t NS  Hyponatremia  Hypokalemia  Metabolic acidosis - mild. R/t diarrhea.  P:   F/u chem  monitor UOP  Replete K  Change MIVF to HCO3    GASTROINTESTINAL Choledocholithiasis - MRCP with common duct stones.  Awaiting ERCP Diarrhea - improved.  Abnormal transaminases  P:   GI following  Needs ERCP +/- sphincterotomy  Continue empiric abx  F/u LFT's    HEMATOLOGIC Anemia - mild Coagulopathy   P:  F/u cbc  F/u INR  Vit K x 1 now   INFECTIOUS Choledocholithiasis HIV - new dx P:   BCx2 5/1>> UC 4/30>>> CDiff 4/30>>> neg   Imipenem 5/1>>> ID to see for new dx HIV  CD4, viral load, etc pending   ENDOCRINE No active issue  P:   Monitor glucose on chem   NEUROLOGIC No active issue  P:   PRN pain rx    FAMILY  - Updates:  Pt updated at length via interpreter 5/1  Dirk Dress, NP 10/08/2014  4:07 PM Pager: [phone] or [phone]  39  year old male with new diagnosis of HIV presenting with choledocholithiasis with associated septic shock and coagulopathy.  Transferred to the ICU for hypotension to start pressors, spoke with Dr. Arlyce Dice from GI, patient is coagulopathic and is responding to fluid so will hold off procedure until more stable.  Change IVF to bicarb drip for hyperchloremic metabolic acidosis.  Hold in the ICU until after procedure.  Will plan on tomorrow morning per Dr. Arlyce Dice for ERCP.  Maintain NPO.  ID following for HIV medications.  Hold off pressors for now as patient does not appear toxic.  The patient is critically ill with multiple organ systems failure and  requires high complexity decision making for assessment and support, frequent evaluation and titration of therapies, application of advanced monitoring technologies and extensive interpretation of multiple databases.   Critical Care Time devoted to patient care services described in this note is  35  Minutes. This time reflects time of care of this signee Dr Jennet Maduro. This critical care time does not reflect procedure time, or teaching time or supervisory time of PA/NP/Med student/Med Resident etc but could involve care discussion time.  Rush Farmer, M.D. [facility] Pulmonary/Critical Care Medicine. Pager: [phone]. After hours pager: [phone].

## 2014-10-08 NOTE — Progress Notes (Signed)
BP 83/50 after 3 liters saline. Will give a 4th. Transfer to ICU. Discussed with Dr. Molli KnockYacoub.    Crista Curborinna Kynslie Ringle, MD [facility] [email or web address] password [email or web address]

## 2014-10-08 NOTE — Progress Notes (Signed)
Pt back from Endoscopy, unable to perform procedure due to low potassium, Attending MD aware. BP still low 76/44 , will fluid bolus 1 liter and continue to monitor

## 2014-10-08 NOTE — Anesthesia Preprocedure Evaluation (Deleted)
Anesthesia Evaluation    Airway        Dental   Pulmonary          Cardiovascular     Neuro/Psych    GI/Hepatic GERD-  Medicated,CBD stones Elevated LFTs CBD stones: diarrhea   Endo/Other    Renal/GU Renal InsufficiencyRenal disease (creat 1.11)     Musculoskeletal   Abdominal   Peds  Hematology  (+) Blood dyscrasia (INR 2.38), , HIV,   Anesthesia Other Findings   Reproductive/Obstetrics                            Anesthesia Physical Anesthesia Plan Anesthesia Quick Evaluation

## 2014-10-08 NOTE — Progress Notes (Signed)
INR 2.4. Discussed with Dr. Oswaldo ConroyJacoub.  While we could proceed with ERCP, I would not do a sphincterotomy because of increased risk for bleeding.  Theraputic options would include simply decompressing the duct and returning ar a later time to remove the stones, or balloon sphincteroplasty to enable stone extraction.  Since the patient appears stable, we have elected to defer ERCP until coagulopathy is reversed.  I have ordered vit k.  Should he become unstable due to sepsis,  We can intervene emergently.

## 2014-10-08 NOTE — Progress Notes (Signed)
Concern for sepsis noted.  ERCP postponed until potassium is repleted.  In view of concern for sepsis, I think it is all the more urgent to proceed with ERCP to hopefully decompress the obstructed CBD.

## 2014-10-08 NOTE — Progress Notes (Signed)
Called by Rn to assist with patient for Code sepsis.  Patient alert and oriented, spanish speaking only.  MD at bedside.  BP consistently 80's/50's. Skin warm and dry, no c/o pain or SOB Received 3 liters NS BP 83/50, HR 67.  Lab at bedside, Abx delivered delivered. Patient to transfer to ICU

## 2014-10-09 ENCOUNTER — Inpatient Hospital Stay (HOSPITAL_COMMUNITY): Payer: Self-pay | Admitting: Certified Registered"

## 2014-10-09 ENCOUNTER — Encounter (HOSPITAL_COMMUNITY): Payer: Self-pay | Admitting: *Deleted

## 2014-10-09 ENCOUNTER — Inpatient Hospital Stay (HOSPITAL_COMMUNITY): Payer: MEDICAID | Admitting: Certified Registered"

## 2014-10-09 ENCOUNTER — Inpatient Hospital Stay (HOSPITAL_COMMUNITY): Payer: Self-pay

## 2014-10-09 ENCOUNTER — Encounter (HOSPITAL_COMMUNITY)
Admission: EM | Disposition: A | Discharge status: Home / Self Care | Payer: Self-pay | Source: Home / Self Care | Attending: Internal Medicine

## 2014-10-09 DIAGNOSIS — R634 Abnormal weight loss: Secondary | ICD-10-CM

## 2014-10-09 HISTORY — PX: ERCP: SHX5425

## 2014-10-09 LAB — CBC WITH DIFFERENTIAL/PLATELET
BASOS ABS: 0 10*3/uL (ref 0.0–0.1)
Basophils Relative: 0 % (ref 0–1)
EOS ABS: 0.7 10*3/uL (ref 0.0–0.7)
Eosinophils Relative: 22 % — ABNORMAL HIGH (ref 0–5)
HEMATOCRIT: 29 % — AB (ref 39.0–52.0)
Hemoglobin: 10 g/dL — ABNORMAL LOW (ref 13.0–17.0)
LYMPHS ABS: 0.8 10*3/uL (ref 0.7–4.0)
Lymphocytes Relative: 24 % (ref 12–46)
MCH: 25.5 pg — ABNORMAL LOW (ref 26.0–34.0)
MCHC: 34.5 g/dL (ref 30.0–36.0)
MCV: 74 fL — ABNORMAL LOW (ref 78.0–100.0)
MONO ABS: 0.2 10*3/uL (ref 0.1–1.0)
MONOS PCT: 6 % (ref 3–12)
NEUTROS ABS: 1.6 10*3/uL — AB (ref 1.7–7.7)
NEUTROS PCT: 48 % (ref 43–77)
Platelets: 144 10*3/uL — ABNORMAL LOW (ref 150–400)
RBC: 3.92 MIL/uL — ABNORMAL LOW (ref 4.22–5.81)
RDW: 15.2 % (ref 11.5–15.5)
WBC: 3.4 10*3/uL — ABNORMAL LOW (ref 4.0–10.5)

## 2014-10-09 LAB — BASIC METABOLIC PANEL
ANION GAP: 9 (ref 5–15)
BUN: 5 mg/dL — AB (ref 6–20)
CHLORIDE: 116 mmol/L — AB (ref 101–111)
CO2: 17 mmol/L — ABNORMAL LOW (ref 22–32)
Calcium: 7.5 mg/dL — ABNORMAL LOW (ref 8.9–10.3)
Creatinine, Ser: 0.96 mg/dL (ref 0.61–1.24)
GFR calc Af Amer: >60 mL/min (ref >60)
GFR calc non Af Amer: >60 mL/min (ref >60)
Glucose, Bld: 87 mg/dL (ref 70–99)
POTASSIUM: 2.8 mmol/L — AB (ref 3.5–5.1)
Sodium: 142 mmol/L (ref 135–145)

## 2014-10-09 LAB — COMPREHENSIVE METABOLIC PANEL
ALK PHOS: 63 U/L (ref 38–126)
ALT: 27 U/L (ref 17–63)
AST: 32 U/L (ref 15–41)
Albumin: 2.1 g/dL — ABNORMAL LOW (ref 3.5–5.0)
Anion gap: 9 (ref 5–15)
BUN: 6 mg/dL (ref 6–20)
CO2: 14 mmol/L — AB (ref 22–32)
CREATININE: 1.04 mg/dL (ref 0.61–1.24)
Calcium: 7.6 mg/dL — ABNORMAL LOW (ref 8.9–10.3)
Chloride: 118 mmol/L — ABNORMAL HIGH (ref 101–111)
GLUCOSE: 80 mg/dL (ref 70–99)
POTASSIUM: 2.8 mmol/L — AB (ref 3.5–5.1)
SODIUM: 141 mmol/L (ref 135–145)
Total Bilirubin: 1 mg/dL (ref 0.3–1.2)
Total Protein: 4.4 g/dL — ABNORMAL LOW (ref 6.5–8.1)

## 2014-10-09 LAB — MAGNESIUM: Magnesium: 1.3 mg/dL — ABNORMAL LOW (ref 1.7–2.4)

## 2014-10-09 LAB — PROTIME-INR
INR: 1.58 — ABNORMAL HIGH (ref 0.00–1.49)
Prothrombin Time: 19 seconds — ABNORMAL HIGH (ref 11.6–15.2)

## 2014-10-09 LAB — T-HELPER CELLS (CD4) COUNT (NOT AT ARMC)
CD4 T CELL HELPER: 2 % — AB (ref 33–55)
CD4 T Cell Abs: 10 /uL — ABNORMAL LOW (ref 400–2700)

## 2014-10-09 LAB — RPR: RPR Ser Ql: NONREACTIVE

## 2014-10-09 SURGERY — ENDOSCOPIC RETROGRADE CHOLANGIOPANCREATOGRAPHY (ERCP) WITH PROPOFOL
Anesthesia: General

## 2014-10-09 SURGERY — ERCP, WITH INTERVENTION IF INDICATED
Anesthesia: General

## 2014-10-09 MED ORDER — MIDAZOLAM HCL 5 MG/5ML IJ SOLN
INTRAMUSCULAR | Status: DC | PRN
  Administered 2014-10-09: 2 mg via INTRAVENOUS

## 2014-10-09 MED ORDER — LACTATED RINGERS IV SOLN
INTRAVENOUS | Status: DC | PRN
  Administered 2014-10-09: 13:00:00 via INTRAVENOUS

## 2014-10-09 MED ORDER — SUCCINYLCHOLINE CHLORIDE 20 MG/ML IJ SOLN
INTRAMUSCULAR | Status: DC | PRN
  Administered 2014-10-09: 100 mg via INTRAVENOUS

## 2014-10-09 MED ORDER — FENTANYL CITRATE (PF) 100 MCG/2ML IJ SOLN
INTRAMUSCULAR | Status: DC | PRN
  Administered 2014-10-09: 50 ug via INTRAVENOUS

## 2014-10-09 MED ORDER — POTASSIUM CHLORIDE 10 MEQ/100ML IV SOLN
10.0000 meq | INTRAVENOUS | Status: AC
  Administered 2014-10-09 (×4): 10 meq via INTRAVENOUS
  Filled 2014-10-09 (×2): qty 100

## 2014-10-09 MED ORDER — SODIUM CHLORIDE 0.9 % IV SOLN
INTRAVENOUS | Status: DC | PRN
  Administered 2014-10-09: 30 mL

## 2014-10-09 MED ORDER — PROPOFOL 10 MG/ML IV BOLUS
INTRAVENOUS | Status: DC | PRN
  Administered 2014-10-09: 150 mg via INTRAVENOUS

## 2014-10-09 MED ORDER — PHENYLEPHRINE HCL 10 MG/ML IJ SOLN
INTRAMUSCULAR | Status: DC | PRN
  Administered 2014-10-09: 80 ug via INTRAVENOUS

## 2014-10-09 MED ORDER — SODIUM CHLORIDE 0.9 % IV SOLN
INTRAVENOUS | Status: DC
  Administered 2014-10-09 – 2014-10-10 (×2): via INTRAVENOUS

## 2014-10-09 MED ORDER — EPHEDRINE SULFATE 50 MG/ML IJ SOLN
INTRAMUSCULAR | Status: DC | PRN
  Administered 2014-10-09: 10 mg via INTRAVENOUS

## 2014-10-09 MED ORDER — ONDANSETRON HCL 4 MG/2ML IJ SOLN
INTRAMUSCULAR | Status: DC | PRN
  Administered 2014-10-09: 4 mg via INTRAVENOUS

## 2014-10-09 MED ORDER — SODIUM CHLORIDE 0.9 % IV BOLUS (SEPSIS)
500.0000 mL | Freq: Once | INTRAVENOUS | Status: AC
  Administered 2014-10-09: 500 mL via INTRAVENOUS

## 2014-10-09 NOTE — Progress Notes (Signed)
Pt transfer from 2H via WC. Ambulatory to bed. No s/s of distress. Oriented to room/call bell.

## 2014-10-09 NOTE — Op Note (Signed)
[facility] [address] [place], [address]   ERCP PROCEDURE REPORT        EXAM DATE: 10/14/2014  PATIENT NAME:          Jon Love, Jon Love          MR #: [id number] BIRTHDATE:       August 10, 1975     VISIT #:     [phone] ATTENDING:     Jeani Hawking, MD     STATUS:     outpatient ASSISTANT:      Burnetta Sabin, and Dwain Sarna   INDICATIONS:  The patient is a 39 yr old male here for an ERCP due to PROCEDURE PERFORMED:     ERCP with sphincterotomy MEDICATIONS:     General Anesthesia  CONSENT: The patient understands the risks and benefits of the procedure and understands that these risks include, but are not limited to: sedation, allergic reaction, infection, perforation and/or bleeding. Alternative means of evaluation and treatment include, among others: physical exam, x-rays, and/or surgical intervention. The patient elects to proceed with this endoscopic procedure.  DESCRIPTION OF PROCEDURE: During intra-op preparation period all mechanical & medical equipment was checked for proper function. Hand hygiene and appropriate measures for infection prevention was taken. After the risks, benefits and alternatives of the procedure were thoroughly explained, Informed was verified, confirmed and timeout was successfully executed by the treatment team. With the patient in left semi-prone position, medications were administered intravenously.The Pentax Ercp Scope [id number] was passed from the mouth into the esophagus and further advanced from the esophagus into the stomach. From stomach scope was directed to the second portion of the duodenum.  Major papilla was aligned with the duodenoscope. The scope position was confirmed fluoroscopically. Rest of the findings/therapeutics are given below. The scope was then completely withdrawn from the patient and the procedure completed. The pulse, BP, and O2 saturation were monitored  and documented by the physician and the nursing staff throughout the entire procedure. The patient was cared for as planned according to standard protocol. The patient was then discharged to recovery in stable condition and with appropriate post procedure care. Estimated blood loss is zero unless otherwise noted in this procedure report.  The ampulla was located in the second portion of the duodenum.  The CBD was cannulated after a couple of attempts.  The guidewire was initially secured in the long cystic duct with a low take off, but it was repositioned and secured in the right intrahepatic ducts. Contrast injection revealed several distal CBD lucencies.  A 1 cm sphincterotomy was created and the duct was swept three times. During the first pass three small black stones were removed.  No other stones were identified and the final occlusion cholangiogram was negative for any retained stones.    ADVERSE EVENT:     No immediate IMPRESSIONS:     Choledocholithiaiss s/p extraction.   RECOMMENDATIONS:     Lap chole per Surgery. REPEAT EXAM:  ___________________________________ Jeani Hawking, MD eSigned:  Jeani Hawking, MD 2014-10-14 1:53 PM  cc:  CPT CODES: ICD9 CODES:  The ICD and CPT codes recommended by this software are interpretations from the data that the clinical staff has captured with the software.  The verification of the translation of this report to the ICD and CPT codes and modifiers is the sole responsibility of the health care institution and practicing physician where this report was generated.  [employer] will not be held responsible  for the validity of the ICD and CPT codes included on this report.  AMA assumes no liability for data contained or not contained herein. CPT is a Publishing rights managerregistered trademark of the [employer].   PATIENT NAME:  Jon Love, Jon Love MR#: [id number]

## 2014-10-09 NOTE — Progress Notes (Signed)
INFECTIOUS DISEASE PROGRESS NOTE  ID: Jon Love is a 39 y.o. male with  Principal Problem:   Choledocholithiasis Active Problems:   Hypokalemia   Acute renal failure   Abnormal transaminases   Metabolic acidosis   Abnormal CT of the abdomen with biliary ductal dilitation   Diarrhea   Hyponatremia   Cholelithiasis   Protein-calorie malnutrition, severe   Intrahepatic bile duct stones   Hypotension   HIV antibody positive   Sepsis  Subjective: C/o pain in abd due to hunger.  States he had diarrhea all night.   Abtx:  Anti-infectives    Start     Dose/Rate Route Frequency Ordered Stop   10/08/14 1300  imipenem-cilastatin (PRIMAXIN) 500 mg in sodium chloride 0.9 % 100 mL IVPB     500 mg 200 mL/hr over 30 Minutes Intravenous Every 8 hours 10/08/14 1250     10/07/14 0400  ciprofloxacin (CIPRO) IVPB 400 mg  Status:  Discontinued     400 mg 200 mL/hr over 60 Minutes Intravenous Every 12 hours 10/06/14 1700 10/08/14 1237   10/06/14 1630  metroNIDAZOLE (FLAGYL) IVPB 500 mg  Status:  Discontinued     500 mg 100 mL/hr over 60 Minutes Intravenous Every 8 hours 10/06/14 1625 10/08/14 1237   10/06/14 1545  ciprofloxacin (CIPRO) IVPB 400 mg     400 mg 200 mL/hr over 60 Minutes Intravenous  Once 10/06/14 1533 10/06/14 1652   10/06/14 1545  metroNIDAZOLE (FLAGYL) IVPB 500 mg  Status:  Discontinued     500 mg 100 mL/hr over 60 Minutes Intravenous  Once 10/06/14 1533 10/06/14 1629      Medications:  Scheduled: . feeding supplement (RESOURCE BREEZE)  1 Container Oral TID BM  . folic acid  1 mg Intravenous Daily  . imipenem-cilastatin (PRIMAXIN) IVPB  500 mg Intravenous Q8H  . indomethacin  100 mg Rectal Once  . phytonadione  10 mg Subcutaneous Q24H  . sodium chloride  250 mL Intravenous Once  . thiamine  100 mg Intravenous Daily    Objective: Vital signs in last 24 hours: Temp:  [97.5 F (36.4 C)-97.9 F (36.6 C)] 97.9 F (36.6 C) (05/02 0800) Pulse Rate:   [51-113] 63 (05/02 1100) Resp:  [10-15] 15 (05/01 1900) BP: (76-109)/(44-66) 90/59 mmHg (05/02 1100) SpO2:  [95 %-100 %] 100 % (05/02 1100) Weight:  [57.7 kg (127 lb 3.3 oz)] 57.7 kg (127 lb 3.3 oz) (05/01 1500)   General appearance: alert, cooperative and no distress Resp: clear to auscultation bilaterally Cardio: regular rate and rhythm GI: normal findings: bowel sounds normal and soft, non-tender  Lab Results  Recent Labs  10/07/14 0302  10/09/14 0300 10/09/14 0936  WBC 5.0  --  3.4*  --   HGB 11.3*  --  10.0*  --   HCT 31.6*  --  29.0*  --   NA 135  < > 141 142  K 3.3*  < > 2.8* 2.8*  CL 112  < > 118* 116*  CO2 12*  < > 14* 17*  BUN 26*  < > 6 5*  CREATININE 1.14  < > 1.04 0.96  < > = values in this interval not displayed. Liver Panel  Recent Labs  10/07/14 0302 10/09/14 0300  PROT 5.5* 4.4*  ALBUMIN 2.5* 2.1*  AST 47* 32  ALT 35 27  ALKPHOS 89 63  BILITOT 1.0 1.0   Sedimentation Rate No results for input(s): ESRSEDRATE in the last 72 hours. C-Reactive Protein No results  for input(s): CRP in the last 72 hours.  Microbiology: Recent Results (from the past 240 hour(s))  Culture, blood (routine x 2)     Status: None (Preliminary result)   Collection Time: 10/06/14  3:39 PM  Result Value Ref Range Status   Specimen Description BLOOD RIGHT WRIST  Final   Special Requests BOTTLES DRAWN AEROBIC AND ANAEROBIC 5CC  Final   Culture   Final           BLOOD CULTURE RECEIVED NO GROWTH TO DATE CULTURE WILL BE HELD FOR 5 DAYS BEFORE ISSUING A FINAL NEGATIVE REPORT Performed at [facility]    Report Status PENDING  Incomplete  Culture, blood (routine x 2)     Status: None (Preliminary result)   Collection Time: 10/06/14  3:43 PM  Result Value Ref Range Status   Specimen Description BLOOD RIGHT ARM  Final   Special Requests BOTTLES DRAWN AEROBIC AND ANAEROBIC 5CC  Final   Culture   Final           BLOOD CULTURE RECEIVED NO GROWTH TO DATE CULTURE WILL BE  HELD FOR 5 DAYS BEFORE ISSUING A FINAL NEGATIVE REPORT Performed at [facility]    Report Status PENDING  Incomplete  MRSA PCR Screening     Status: None   Collection Time: 10/06/14  6:58 PM  Result Value Ref Range Status   MRSA by PCR NEGATIVE NEGATIVE Final    Comment:        The GeneXpert MRSA Assay (FDA approved for NASAL specimens only), is one component of a comprehensive MRSA colonization surveillance program. It is not intended to diagnose MRSA infection nor to guide or monitor treatment for MRSA infections.   Clostridium Difficile by PCR     Status: None   Collection Time: 10/08/14  9:00 AM  Result Value Ref Range Status   C difficile by pcr NEGATIVE NEGATIVE Final  Culture, blood (x 2)     Status: None (Preliminary result)   Collection Time: 10/08/14  1:55 PM  Result Value Ref Range Status   Specimen Description BLOOD LEFT HAND  Final   Special Requests BOTTLES DRAWN AEROBIC ONLY  [place]  Final   Culture   Final           BLOOD CULTURE RECEIVED NO GROWTH TO DATE CULTURE WILL BE HELD FOR 5 DAYS BEFORE ISSUING A FINAL NEGATIVE REPORT Performed at [facility]    Report Status PENDING  Incomplete  Culture, blood (x 2)     Status: None (Preliminary result)   Collection Time: 10/08/14  2:03 PM  Result Value Ref Range Status   Specimen Description BLOOD RIGHT HAND  Final   Special Requests BOTTLES DRAWN AEROBIC ONLY  3CC  Final   Culture   Final           BLOOD CULTURE RECEIVED NO GROWTH TO DATE CULTURE WILL BE HELD FOR 5 DAYS BEFORE ISSUING A FINAL NEGATIVE REPORT Performed at [facility]    Report Status PENDING  Incomplete    Studies/Results: No results found.   Assessment/Plan: HIV+ Diarrhea hypokalemia Choledocholithiasis AKI Wt loss  Total days of antibiotics: 5 (imipenem)  Await his CD4, HLA, genotype Await ERCP would  Query if he has AIDS cholangiopathy due to crypto or mircosporidia Could GI send bile for  pathogens?         Bobby Rumpf Infectious Diseases (pager) [phone] [email or web address] 10/09/2014, 11:09 AM  LOS: 3 days

## 2014-10-09 NOTE — Anesthesia Postprocedure Evaluation (Signed)
Anesthesia Post Note  Patient: Jon Love  Procedure(s) Performed: Procedure(s) (LRB): ENDOSCOPIC RETROGRADE CHOLANGIOPANCREATOGRAPHY (ERCP) (N/A)  Anesthesia type: General  Patient location: PACU  Post pain: Pain level controlled  Post assessment: Post-op Vital signs reviewed  Last Vitals: BP 92/53 mmHg  Pulse 67  Temp(Src) 36.8 C (Oral)  Resp 14  Ht 5\' 10"  (1.778 m)  Wt 127 lb 3.3 oz (57.7 kg)  BMI 18.25 kg/m2  SpO2 100%  Post vital signs: Reviewed  Level of consciousness: sedated  Complications: No apparent anesthesia complications

## 2014-10-09 NOTE — Consult Note (Signed)
PULMONARY / CRITICAL CARE MEDICINE   Name: Jon Love MRN: [id number] DOB: [birth year]    ADMISSION DATE:  10/06/2014 CONSULTATION DATE:  Aida Raider5/1  REFERRING MD :  Lendell CapriceSullivan ([facility])   CHIEF COMPLAINT:  Hypotension, sepsis   INITIAL PRESENTATION:  39 yo spanish speaking male with new dx HIV (this admit), otherwise no known PMH, admitted 4/29 with diarrhea and abd pain.  Found to have choledocholithiasis and treated with IV abx, followed closely by GI.  Was awaiting ERCP/sphincterotomy which was postponed r/t hypokalemia.  On 5/1 had progressive hypotension, refractory to IV fluids and PCCM consulted for ICU tx.   STUDIES:  CT abd 4/29>>> Mild diffuse intrahepatic and extrahepatic biliary dilatation with possible choledocholithiasis and cholelithiasis MRCP abd 4/30>>> Cholelithiasis, without definite radiographic signs of acute cholecystitis. Diffuse biliary ductal dilatation secondary to choledocholithiasis; 2 or 3 small less than 1 cm calculi in the distal common bile duct.   SIGNIFICANT EVENTS: 5/1 persistent hypotension, tx ICU 5/2 ERCP with sphincterotomy and removal of stones   SUBJECTIVE:  Better s/p ERCP, tolerating clear liquids.   VITAL SIGNS: Temp:  [97.6 F (36.4 C)-98.2 F (36.8 C)] 98.2 F (36.8 C) (05/02 1450) Pulse Rate:  [51-113] 67 (05/02 1450) Resp:  [10-18] 14 (05/02 1450) BP: (78-109)/(49-66) 92/53 mmHg (05/02 1450) SpO2:  [95 %-100 %] 100 % (05/02 1450)    INTAKE / OUTPUT:  Intake/Output Summary (Last 24 hours) at 10/09/14 1513 Last data filed at 10/09/14 1450  Gross per 24 hour  Intake   2420 ml  Output   3350 ml  Net   -930 ml    PHYSICAL EXAMINATION: General:  Pleasant young male, spanish speaking, NAD  Neuro:  Awake, alert, appropriate, MAE HEENT:  Mm moist, no JVD Cardiovascular:  s1s2 rrr Lungs:  resps even, non labored on RA Abdomen:  Soft, mildly tender diffusely, +bs  Musculoskeletal:  Warm and dry, no edema     LABS:  CBC  Recent Labs Lab 10/06/14 1159 10/07/14 0302 10/09/14 0300  WBC 6.4 5.0 3.4*  HGB 15.5 11.3* 10.0*  HCT 43.4 31.6* 29.0*  PLT 302 192 144*   Coag's  Recent Labs Lab 10/08/14 1415 10/09/14 0936  APTT 37  --   INR 2.38* 1.58*   BMET  Recent Labs Lab 10/08/14 1415 10/09/14 0300 10/09/14 0936  NA 140 141 142  K 3.4* 2.8* 2.8*  CL 123* 118* 116*  CO2 13* 14* 17*  BUN 9 6 5*  CREATININE 1.00 1.04 0.96  GLUCOSE 79 80 87   Electrolytes  Recent Labs Lab 10/07/14 1640  10/08/14 1415 10/09/14 0300 10/09/14 0936  CALCIUM  --   < > 7.3* 7.6* 7.5*  MG 1.8  --   --  1.3*  --   < > = values in this interval not displayed. Sepsis Markers  Recent Labs Lab 10/06/14 1539 10/06/14 2021 10/08/14 1415  LATICACIDVEN 0.9 1.3 1.6  PROCALCITON  --   --  <0.10   ABG No results for input(s): PHART, PCO2ART, PO2ART in the last 168 hours. Liver Enzymes  Recent Labs Lab 10/06/14 1159 10/07/14 0302 10/09/14 0300  AST 66* 47* 32  ALT 50 35 27  ALKPHOS 134* 89 63  BILITOT 1.8* 1.0 1.0  ALBUMIN 3.7 2.5* 2.1*   Cardiac Enzymes No results for input(s): TROPONINI, PROBNP in the last 168 hours. Glucose No results for input(s): GLUCAP in the last 168 hours.  Imaging No results found.   ASSESSMENT / PLAN:  PULMONARY  No active issue  P:   Monitor Supplemental O2 as needed   CARDIOVASCULAR Shock, likely septic + hypovolemic. Resolved  Good mental status, good UOP.  Lactate, pct neg.  P:  Cont gentle volume    RENAL AKI - improved.  Hyperchloremia - r/t NS  Hyponatremia  Hypokalemia  Metabolic acidosis - mild. R/t diarrhea.  P:   F/u chem  monitor UOP  Replete K  D/c Na bicarb   GASTROINTESTINAL Choledocholithiasis - MRCP with common duct stones.  Improved s/p ERCP on 5/2 Diarrhea - improved.  Abnormal transaminases  P:   GI following  Continue empiric abx  F/u LFT's    HEMATOLOGIC Anemia - mild Coagulopathy   P:  F/u cbc   F/u INR  Vit K x 1 5/1  INFECTIOUS Choledocholithiasis HIV - new dx P:   BCx2 5/1>> UC 4/30>>> CDiff 4/30>>> neg   Imipenem 5/1>>> ID following for new dx HIV  CD4, viral load, etc pending   ENDOCRINE No active issue  P:   Monitor glucose on chem   NEUROLOGIC No active issue  P:   PRN pain rx    FAMILY  - Updates:  Pt updated at length via interpreter 5/1   Levy Pupa, MD, PhD 10/09/2014, 3:20 PM [facility] [phone] or if no answer [phone]

## 2014-10-09 NOTE — Progress Notes (Signed)
eLink Physician-Brief Progress Note Patient Name: Jon Love DOB: [birth year] MRN: [id number]   Date of Service  10/09/2014  HPI/Events of Note  Borderline MAP  eICU Interventions  Bolus x 1 Awake alert,      Intervention Category Intermediate Interventions: Hypotension - evaluation and management  [name] J. 10/09/2014, 11:01 PM

## 2014-10-09 NOTE — Anesthesia Procedure Notes (Signed)
Procedure Name: Intubation Date/Time: 10/09/2014 1:17 PM Performed by: Fransisca KaufmannMEYER, Zareth Rippetoe E Pre-anesthesia Checklist: Patient identified, Emergency Drugs available, Suction available, Patient being monitored and Timeout performed Patient Re-evaluated:Patient Re-evaluated prior to inductionOxygen Delivery Method: Circle system utilized Preoxygenation: Pre-oxygenation with 100% oxygen Intubation Type: IV induction Ventilation: Mask ventilation without difficulty Laryngoscope Size: Miller and 2 Grade View: Grade I Tube type: Oral Tube size: 7.5 mm Number of attempts: 1 Airway Equipment and Method: Stylet Placement Confirmation: ETT inserted through vocal cords under direct vision,  positive ETCO2 and breath sounds checked- equal and bilateral Secured at: 21 cm Tube secured with: Tape Dental Injury: Teeth and Oropharynx as per pre-operative assessment

## 2014-10-09 NOTE — Care Management Note (Signed)
Case Management Note  Patient Details  Name: Jon Love MRN: [id number] Date of Birth: [birth year]  Subjective/Objective:     Adm w cholilithiasis               Action/Plan:lives w friend   Expected Discharge Date:  10/09/14               Expected Discharge Plan:  Home/Self Care  In-House Referral:     Discharge planning Services     Post Acute Care Choice:    Choice offered to:     DME Arranged:    DME Agency:     HH Arranged:    HH Agency:     Status of Service:     Medicare Important Message Given:    Date Medicare IM Given:    Medicare IM give by:    Date Additional Medicare IM Given:    Additional Medicare Important Message give by:     If discussed at Long Length of Stay Meetings, dates discussed:    Additional Comments:  Hanley HaysDowell, Cait Locust T, RN 10/09/2014, 1:26 PM

## 2014-10-09 NOTE — Transfer of Care (Signed)
Immediate Anesthesia Transfer of Care Note  Patient: Jon Love  Procedure(s) Performed: Procedure(s): ENDOSCOPIC RETROGRADE CHOLANGIOPANCREATOGRAPHY (ERCP) (N/A)  Patient Location: PACU and Endoscopy Unit  Anesthesia Type:General  Level of Consciousness: awake, alert , oriented and sedated  Airway & Oxygen Therapy: Patient Spontanous Breathing and Patient connected to nasal cannula oxygen  Post-op Assessment: Report given to RN, Post -op Vital signs reviewed and stable and Patient moving all extremities  Post vital signs: Reviewed and stable  Last Vitals:  Filed Vitals:   10/09/14 1149  BP: 94/57  Pulse: 62  Temp: 36.8 C  Resp: 18    Complications: No apparent anesthesia complications

## 2014-10-09 NOTE — Anesthesia Preprocedure Evaluation (Addendum)
Anesthesia Evaluation  Patient identified by MRN, date of birth, ID band Patient awake    Reviewed: Allergy & Precautions, NPO status , Patient's Chart, lab work & pertinent test results  Airway Mallampati: II  TM Distance: >3 FB Neck ROM: Full    Dental no notable dental hx.    Pulmonary neg pulmonary ROS,  breath sounds clear to auscultation  Pulmonary exam normal       Cardiovascular negative cardio ROS  Rhythm:Regular Rate:Normal     Neuro/Psych negative neurological ROS  negative psych ROS   GI/Hepatic GERD-  Medicated,CBD stones Elevated LFTs CBD stones: diarrhea   Endo/Other  negative endocrine ROS  Renal/GU Renal InsufficiencyRenal disease (creat 1.11)negative Renal ROS     Musculoskeletal negative musculoskeletal ROS (+)   Abdominal   Peds  Hematology  (+) Blood dyscrasia (INR 2.38), , HIV,   Anesthesia Other Findings   Reproductive/Obstetrics                            Anesthesia Physical  Anesthesia Plan  ASA: III  Anesthesia Plan: General   Post-op Pain Management:    Induction: Intravenous  Airway Management Planned: Oral ETT  Additional Equipment: None  Intra-op Plan:   Post-operative Plan: Extubation in OR  Informed Consent: I have reviewed the patients History and Physical, chart, labs and discussed the procedure including the risks, benefits and alternatives for the proposed anesthesia with the patient or authorized representative who has indicated his/her understanding and acceptance.   Dental advisory given  Plan Discussed with: CRNA  Anesthesia Plan Comments:        Anesthesia Quick Evaluation

## 2014-10-09 NOTE — Progress Notes (Signed)
1 Day Post-Op  Subjective: Events of weekend noted Has mild pain today  Objective: Vital signs in last 24 hours: Temp:  [97.4 F (36.3 C)-97.9 F (36.6 C)] 97.9 F (36.6 C) (05/02 0342) Pulse Rate:  [51-113] 65 (05/02 0700) Resp:  [10-17] 15 (05/01 1900) BP: (76-109)/(44-66) 95/60 mmHg (05/02 0700) SpO2:  [95 %-100 %] 100 % (05/02 0700) Weight:  [57.7 kg (127 lb 3.3 oz)] 57.7 kg (127 lb 3.3 oz) (05/01 1500) Last BM Date: 10/08/14  Intake/Output from previous day: 05/01 0701 - 05/02 0700 In: 1820 [I.V.:1020; IV Piggyback:800] Out: 2650 [Urine:2650] Intake/Output this shift: Total I/O In: 100 [IV Piggyback:100] Out: -   Abdomen soft with only mild epigastric tenderness  Lab Results:   Recent Labs  10/07/14 0302 10/09/14 0300  WBC 5.0 3.4*  HGB 11.3* 10.0*  HCT 31.6* 29.0*  PLT 192 144*   BMET  Recent Labs  10/08/14 1415 10/09/14 0300  NA 140 141  K 3.4* 2.8*  CL 123* 118*  CO2 13* 14*  GLUCOSE 79 80  BUN 9 6  CREATININE 1.00 1.04  CALCIUM 7.3* 7.6*   PT/INR  Recent Labs  10/08/14 1415  LABPROT 26.2*  INR 2.38*   ABG No results for input(s): PHART, HCO3 in the last 72 hours.  Invalid input(s): PCO2, PO2  Studies/Results: Mr 3d Recon At Scanner  10/07/2014   CLINICAL DATA:  Right upper quadrant abdominal pain. Nausea and vomiting, and diarrhea. Biliary dilatation and possible choledocholithiasis seen on recent CT.  EXAM: MRI ABDOMEN WITHOUT AND WITH CONTRAST (INCLUDING MRCP)  TECHNIQUE: Multiplanar multisequence MR imaging of the abdomen was performed both before and after the administration of intravenous contrast. Heavily T2-weighted images of the biliary and pancreatic ducts were obtained, and three-dimensional MRCP images were rendered by post processing.  CONTRAST:  12 mL MultiHance  COMPARISON:  CT on 10/06/2014  FINDINGS: Lower chest:  Unremarkable.  Hepatobiliary: No liver masses are identified. Gallbladder is distended and contains several  small gallstones, however there is no evidence of gallbladder wall thickening or pericholecystic inflammatory changes.  Mild diffuse biliary ductal dilatation is demonstrated with common bile duct measuring 8 mm. Two or 3 small less than 1 cm calculi are seen in the distal common bile duct.  Pancreas: No masses, inflammatory changes, or fluid collections demonstrated. No evidence pancreatic ductal dilatation or pancreas divisum.  Spleen:  Within normal limits in size and appearance.  Adrenal Glands:  No masses identified.  Kidneys: No renal masses identified. No evidence of hydronephrosis. Tiny sub-cm cyst is seen in the anterior upper pole of the left kidney.  Stomach/Bowel/Peritoneum: No evidence of wall thickening, mass, or obstruction involving visualized abdominal bowel.  Vascular/Lymphatic: No pathologically enlarged lymph nodes identified. No other significant abnormality noted.  Other:  None.  Musculoskeletal:  No suspicious bone lesions identified.  IMPRESSION: Cholelithiasis, without definite radiographic signs of acute cholecystitis.  Diffuse biliary ductal dilatation secondary to choledocholithiasis; 2 or 3 small less than 1 cm calculi in the distal common bile duct.   Electronically Signed   By: Myles Rosenthal M.D.   On: 10/07/2014 09:29   Mr Abd W/wo Cm/mrcp  10/07/2014   CLINICAL DATA:  Right upper quadrant abdominal pain. Nausea and vomiting, and diarrhea. Biliary dilatation and possible choledocholithiasis seen on recent CT.  EXAM: MRI ABDOMEN WITHOUT AND WITH CONTRAST (INCLUDING MRCP)  TECHNIQUE: Multiplanar multisequence MR imaging of the abdomen was performed both before and after the administration of intravenous contrast. Heavily T2-weighted  images of the biliary and pancreatic ducts were obtained, and three-dimensional MRCP images were rendered by post processing.  CONTRAST:  12 mL MultiHance  COMPARISON:  CT on 10/06/2014  FINDINGS: Lower chest:  Unremarkable.  Hepatobiliary: No liver masses  are identified. Gallbladder is distended and contains several small gallstones, however there is no evidence of gallbladder wall thickening or pericholecystic inflammatory changes.  Mild diffuse biliary ductal dilatation is demonstrated with common bile duct measuring 8 mm. Two or 3 small less than 1 cm calculi are seen in the distal common bile duct.  Pancreas: No masses, inflammatory changes, or fluid collections demonstrated. No evidence pancreatic ductal dilatation or pancreas divisum.  Spleen:  Within normal limits in size and appearance.  Adrenal Glands:  No masses identified.  Kidneys: No renal masses identified. No evidence of hydronephrosis. Tiny sub-cm cyst is seen in the anterior upper pole of the left kidney.  Stomach/Bowel/Peritoneum: No evidence of wall thickening, mass, or obstruction involving visualized abdominal bowel.  Vascular/Lymphatic: No pathologically enlarged lymph nodes identified. No other significant abnormality noted.  Other:  None.  Musculoskeletal:  No suspicious bone lesions identified.  IMPRESSION: Cholelithiasis, without definite radiographic signs of acute cholecystitis.  Diffuse biliary ductal dilatation secondary to choledocholithiasis; 2 or 3 small less than 1 cm calculi in the distal common bile duct.   Electronically Signed   By: Myles RosenthalJohn  Stahl M.D.   On: 10/07/2014 09:29    Anti-infectives: Anti-infectives    Start     Dose/Rate Route Frequency Ordered Stop   10/08/14 1300  imipenem-cilastatin (PRIMAXIN) 500 mg in sodium chloride 0.9 % 100 mL IVPB     500 mg 200 mL/hr over 30 Minutes Intravenous Every 8 hours 10/08/14 1250     10/07/14 0400  ciprofloxacin (CIPRO) IVPB 400 mg  Status:  Discontinued     400 mg 200 mL/hr over 60 Minutes Intravenous Every 12 hours 10/06/14 1700 10/08/14 1237   10/06/14 1630  metroNIDAZOLE (FLAGYL) IVPB 500 mg  Status:  Discontinued     500 mg 100 mL/hr over 60 Minutes Intravenous Every 8 hours 10/06/14 1625 10/08/14 1237   10/06/14  1545  ciprofloxacin (CIPRO) IVPB 400 mg     400 mg 200 mL/hr over 60 Minutes Intravenous  Once 10/06/14 1533 10/06/14 1652   10/06/14 1545  metroNIDAZOLE (FLAGYL) IVPB 500 mg  Status:  Discontinued     500 mg 100 mL/hr over 60 Minutes Intravenous  Once 10/06/14 1533 10/06/14 1629      Assessment/Plan: s/p Procedure(s): CANCELLED PROCEDURE  CBD stones, HIV, hypotension, coagulopathy  ERCP on hold until coagulopathy corrected. Eventual lap chole as well   LOS: 3 days    [place] Sigmund A 10/09/2014

## 2014-10-10 ENCOUNTER — Inpatient Hospital Stay (HOSPITAL_COMMUNITY): Payer: Self-pay

## 2014-10-10 ENCOUNTER — Encounter (HOSPITAL_COMMUNITY): Payer: Self-pay | Admitting: Gastroenterology

## 2014-10-10 DIAGNOSIS — I959 Hypotension, unspecified: Secondary | ICD-10-CM

## 2014-10-10 LAB — BASIC METABOLIC PANEL
ANION GAP: 9 (ref 5–15)
BUN: <5 mg/dL — ABNORMAL LOW (ref 6–20)
CALCIUM: 6.5 mg/dL — AB (ref 8.9–10.3)
CO2: 22 mmol/L (ref 22–32)
Chloride: 109 mmol/L (ref 101–111)
Creatinine, Ser: 0.92 mg/dL (ref 0.61–1.24)
GFR calc Af Amer: >60 mL/min (ref >60)
GFR calc non Af Amer: >60 mL/min (ref >60)
GLUCOSE: 132 mg/dL — AB (ref 70–99)
Potassium: 2.2 mmol/L — CL (ref 3.5–5.1)
SODIUM: 140 mmol/L (ref 135–145)

## 2014-10-10 MED ORDER — SULFAMETHOXAZOLE-TRIMETHOPRIM 400-80 MG PO TABS
1.0000 | ORAL_TABLET | Freq: Every day | ORAL | Status: DC
  Administered 2014-10-10 – 2014-10-13 (×4): 1 via ORAL
  Filled 2014-10-10 (×5): qty 1

## 2014-10-10 MED ORDER — POTASSIUM CHLORIDE CRYS ER 20 MEQ PO TBCR
40.0000 meq | EXTENDED_RELEASE_TABLET | Freq: Three times a day (TID) | ORAL | Status: DC
  Administered 2014-10-10 (×3): 40 meq via ORAL
  Filled 2014-10-10 (×5): qty 2

## 2014-10-10 MED ORDER — ALBUMIN HUMAN 5 % IV SOLN
12.5000 g | Freq: Once | INTRAVENOUS | Status: AC
  Administered 2014-10-10: 12.5 g via INTRAVENOUS
  Filled 2014-10-10: qty 250

## 2014-10-10 MED ORDER — MAGNESIUM SULFATE 4 GM/100ML IV SOLN
4.0000 g | Freq: Once | INTRAVENOUS | Status: AC
  Administered 2014-10-10: 4 g via INTRAVENOUS
  Filled 2014-10-10 (×2): qty 100

## 2014-10-10 MED ORDER — SODIUM CHLORIDE 0.9 % IV BOLUS (SEPSIS)
500.0000 mL | Freq: Once | INTRAVENOUS | Status: AC
  Administered 2014-10-10: 500 mL via INTRAVENOUS

## 2014-10-10 MED ORDER — AZITHROMYCIN 600 MG PO TABS
1200.0000 mg | ORAL_TABLET | ORAL | Status: DC
  Administered 2014-10-10: 1200 mg via ORAL
  Filled 2014-10-10 (×2): qty 2

## 2014-10-10 MED ORDER — POTASSIUM CHLORIDE IN NACL 40-0.9 MEQ/L-% IV SOLN
INTRAVENOUS | Status: DC
  Administered 2014-10-10 (×2): 125 mL/h via INTRAVENOUS
  Administered 2014-10-12: 75 mL/h via INTRAVENOUS
  Filled 2014-10-10 (×9): qty 1000

## 2014-10-10 MED ORDER — SODIUM CHLORIDE 0.9 % IV BOLUS (SEPSIS)
1000.0000 mL | Freq: Once | INTRAVENOUS | Status: AC
  Administered 2014-10-10: 1000 mL via INTRAVENOUS

## 2014-10-10 MED ORDER — PHYTONADIONE 5 MG PO TABS
10.0000 mg | ORAL_TABLET | Freq: Every day | ORAL | Status: AC
Start: 2014-10-10 — End: 2014-10-12
  Administered 2014-10-10 – 2014-10-12 (×3): 10 mg via ORAL
  Filled 2014-10-10 (×4): qty 2

## 2014-10-10 NOTE — Progress Notes (Signed)
[facility] PROGRESS NOTE  Jon Love ZOX:[id number] DOB: April 26, 1976 DOA: 10/06/2014 PCP: No PCP Per Patient   summary: 39 yo Spanish speaking male presents with diarrhea and abdominal pain. Found to have cholelithiasis, biliary ductal dilatation concerning for choledocholithiasis. Charted on empiric Cipro and Flagyl. Surgery and GI consultation in the emergency room. Stool studies ordered, MRCP shows common duct stone Without evidence of cholecystitis.  Transferred to ICU for refractory shock 5/1. Had ERCP, sphincterotomy  Assessment/Plan: Choledocholithiasis: s/p ERCP sphincterotomy Active Problems:  hypotension: BPs still borderline. Continue IVF. Ambulating ok. Continue primaxin for now.  Check am cortisol. Continue IVF   Hypokalemia continues secondary to diarrhea. Will replete IV and PO.  Cough: check CXR. R/o infiltrate or pulmonary edema Hypomagnesemia: replete IV. AIDS: CD4 abs 10. ID starting MAC and PCP prophylaxis.  quantiferon got cancelled? Will reorder.    Acute renal failure:  resolved   Metabolic acidosis resolved   Diarrhea: continues. GI pathogen panel pending. c diff neg   Hyponatremia, hypovolemic, resolved with IV hydration   Cholelithiasis:  Cholecystectomy tomorrow if stable   Code Status:  full Family Communication:   discussed with patient via interpreter found. No family present. Patient is alert and oriented.  Disposition Plan:  Home once medically stable.   Consultants:  GI: Particia Nearing. Surgery  ID  Procedures:   ERCP sphincterotomy  Antibiotics: Cipro, Flagyl 4/29 -- 5/1 primaxin 5/1 -  HPI/Subjective: Some abd pain. No n/v. Diarrhea continues. A little dizzy. C/o cough  Objective: Filed Vitals:   10/10/14 0512  BP: 89/56  Pulse: 64  Temp: 98.1 F (36.7 C)  Resp: 18    Intake/Output Summary (Last 24 hours) at 10/10/14 1314 Last data filed at 10/10/14 1146  Gross per 24 hour  Intake   1530 ml  Output   3300 ml  Net   -1770 ml   Filed Weights   10/07/14 2102 10/08/14 1500 10/09/14 1824  Weight: 57.788 kg (127 lb 6.4 oz) 57.7 kg (127 lb 3.3 oz) 62.4 kg (137 lb 9.1 oz)    Exam:   General:  A and o. Pleasant cooperative. coughing  Cardiovascular: Regular rate rhythm without murmurs gallops rubs  Respiratory: Clear to auscultation bilaterally without wheezes rhonchi or rales  Abdomen: Soft nontender nondistended  Ext: No clubbing cyanosis or edema  Basic Metabolic Panel:  Recent Labs Lab 10/07/14 1640 10/08/14 0740 10/08/14 1415 10/09/14 0300 10/09/14 0936 10/10/14 1100  NA  --  136 140 141 142 140  K  --  2.8* 3.4* 2.8* 2.8* 2.2*  CL  --  118* 123* 118* 116* 109  CO2  --  11* 13* 14* 17* 22  GLUCOSE  --  84 79 80 87 132*  BUN  --  5* <5*  CREATININE  --  1.11 1.00 1.04 0.96 0.92  CALCIUM  --  8.1* 7.3* 7.6* 7.5* 6.5*  MG 1.8  --   --  1.3*  --   --    Liver Function Tests:  Recent Labs Lab 10/06/14 1159 10/07/14 0302 10/09/14 0300  AST 66* 47* 32  ALT 50 35 27  ALKPHOS 134* 89 63  BILITOT 1.8* 1.0 1.0  PROT 8.1 5.5* 4.4*  ALBUMIN 3.7 2.5* 2.1*    Recent Labs Lab 10/06/14 1159  LIPASE 36   No results for input(s): AMMONIA in the last 168 hours. CBC:  Recent Labs Lab 10/06/14 1159 10/07/14 0302 10/09/14 0300  WBC 6.4 5.0 3.4*  NEUTROABS 4.2  --  1.6*  HGB 15.5 11.3* 10.0*  HCT 43.4 31.6* 29.0*  MCV 73.1* 72.1* 74.0*  PLT 302 192 144*   Cardiac Enzymes: No results for input(s): CKTOTAL, CKMB, CKMBINDEX, TROPONINI in the last 168 hours. BNP (last 3 results) No results for input(s): BNP in the last 8760 hours.  ProBNP (last 3 results) No results for input(s): PROBNP in the last 8760 hours.  CBG: No results for input(s): GLUCAP in the last 168 hours.  Recent Results (from the past 240 hour(s))  Culture, blood (routine x 2)     Status: None (Preliminary result)   Collection Time: 10/06/14  3:39 PM  Result Value Ref Range Status   Specimen  Description BLOOD RIGHT WRIST  Final   Special Requests BOTTLES DRAWN AEROBIC AND ANAEROBIC 5CC  Final   Culture   Final           BLOOD CULTURE RECEIVED NO GROWTH TO DATE CULTURE WILL BE HELD FOR 5 DAYS BEFORE ISSUING A FINAL NEGATIVE REPORT Performed at [facility]    Report Status PENDING  Incomplete  Culture, blood (routine x 2)     Status: None (Preliminary result)   Collection Time: 10/06/14  3:43 PM  Result Value Ref Range Status   Specimen Description BLOOD RIGHT ARM  Final   Special Requests BOTTLES DRAWN AEROBIC AND ANAEROBIC 5CC  Final   Culture   Final           BLOOD CULTURE RECEIVED NO GROWTH TO DATE CULTURE WILL BE HELD FOR 5 DAYS BEFORE ISSUING A FINAL NEGATIVE REPORT Performed at [facility]    Report Status PENDING  Incomplete  MRSA PCR Screening     Status: None   Collection Time: 10/06/14  6:58 PM  Result Value Ref Range Status   MRSA by PCR NEGATIVE NEGATIVE Final    Comment:        The GeneXpert MRSA Assay (FDA approved for NASAL specimens only), is one component of a comprehensive MRSA colonization surveillance program. It is not intended to diagnose MRSA infection nor to guide or monitor treatment for MRSA infections.   Clostridium Difficile by PCR     Status: None   Collection Time: 10/08/14  9:00 AM  Result Value Ref Range Status   C difficile by pcr NEGATIVE NEGATIVE Final  Culture, blood (x 2)     Status: None (Preliminary result)   Collection Time: 10/08/14  1:55 PM  Result Value Ref Range Status   Specimen Description BLOOD LEFT HAND  Final   Special Requests BOTTLES DRAWN AEROBIC ONLY  9CC  Final   Culture   Final           BLOOD CULTURE RECEIVED NO GROWTH TO DATE CULTURE WILL BE HELD FOR 5 DAYS BEFORE ISSUING A FINAL NEGATIVE REPORT Performed at [facility]    Report Status PENDING  Incomplete  Culture, blood (x 2)     Status: None (Preliminary result)   Collection Time: 10/08/14  2:03 PM  Result Value Ref  Range Status   Specimen Description BLOOD RIGHT HAND  Final   Special Requests BOTTLES DRAWN AEROBIC ONLY  3CC  Final   Culture   Final           BLOOD CULTURE RECEIVED NO GROWTH TO DATE CULTURE WILL BE HELD FOR 5 DAYS BEFORE ISSUING A FINAL NEGATIVE REPORT Performed at [facility]    Report Status PENDING  Incomplete  Studies: Dg Ercp Biliary & Pancreatic Ducts  10/09/2014   CLINICAL DATA:  Cholelithiasis  EXAM: ERCP  TECHNIQUE: Multiple spot images obtained with the fluoroscopic device and submitted for interpretation post-procedure.  FLUOROSCOPY TIME:  3 minutes 38 seconds  COMPARISON:  MR 10/07/2014  FINDINGS: Four fluoroscopic spot images document endoscopic cannulation and opacification of the central biliary tree. Possible filling defects in the mid CBD on the initial image. Subsequent Images document passage of a balloon catheter through the CBD. The intrahepatic bile ducts are incompletely opacified, appearing decompressed centrally. Some contrast flows through the patent cystic duct into the gallbladder. No extravasation identified.  IMPRESSION: 1. ERCP with balloon sweep of the common duct. These images were submitted for radiologic interpretation only. Please see the procedural report for the amount of contrast and the fluoroscopy time utilized.   Electronically Signed   By: Corlis Leak  Hassell M.D.   On: 10/09/2014 14:12    Scheduled Meds: . feeding supplement (RESOURCE BREEZE)  1 Container Oral TID BM  . folic acid  1 mg Intravenous Daily  . imipenem-cilastatin (PRIMAXIN) IVPB  500 mg Intravenous Q8H  . magnesium sulfate 1 - 4 g bolus IVPB  4 g Intravenous Once  . potassium chloride  40 mEq Oral TID  . thiamine  100 mg Intravenous Daily   Continuous Infusions: . 0.9 % NaCl with KCl 40 mEq / L 125 mL/hr (10/10/14 1303)    Time spent: 35 minutes  Inaya Gillham L  [facility] Pager [phone]. If 7PM-7AM, please contact night-coverage at [email or web address], password  [email or web address] 10/10/2014, 1:14 PM  LOS: 4 days

## 2014-10-10 NOTE — Progress Notes (Addendum)
Approximately 2200 patient was hypotensive BP 80/47, asymptomatic. Multiple attempts to reach MD, finally got a hold of MD and 500ml bolus was ordered. Post bolus, pt still hypotensive and asymptomatic BP of 80/46. MD called again and another 500ml bolus was ordered. Post 2nd bolus BP 74/42.   MD called post 2nd bolus. New orders for 1,06500ml bolus, one time dose 5% albumin, and an increase of IV fluid rate from 6650ml/hr to 13825ml/hr.  Post 1,03300ml bolus, pt BP 80s/50s. MD stated that if patient was asymptomatic, a systolic of 80 or above was acceptable.   Communicated with patient using interpreter line to explain interventions. Will continue to monitor.

## 2014-10-10 NOTE — Progress Notes (Signed)
eLink Physician-Brief Progress Note Patient Name: Lucinda Dellntonio Hernandez DOB: [birth year] MRN: [id number]   Date of Service  10/10/2014  HPI/Events of Note  Continued hypotension despite 500 cc NS fluid bolus.  Current BP of 80/46.  Patient is alert, in NAD.  BP is similar in both arms.  Making urine.  BP typically runs low.  Taking po clear liquids and IVF rate at 50 cc/hr  eICU Interventions  Plan: Additional 500 cc NS bolus.     Intervention Category Intermediate Interventions: Hypotension - evaluation and management  Kareena Arrambide 10/10/2014, 12:03 AM

## 2014-10-10 NOTE — Progress Notes (Signed)
MD Lendell CapriceSullivan paged, critical potassium lab value of 2.2, asked for intervention.

## 2014-10-10 NOTE — Progress Notes (Addendum)
INFECTIOUS DISEASE PROGRESS NOTE  ID: Jon Love is a 39 y.o. male with  Principal Problem:   Choledocholithiasis Active Problems:   Hypokalemia   Acute renal failure   Abnormal transaminases   Metabolic acidosis   Abnormal CT of the abdomen with biliary ductal dilitation   Diarrhea   Hyponatremia   Cholelithiasis   Protein-calorie malnutrition, severe   Intrahepatic bile duct stones   Hypotension   HIV antibody positive   Sepsis  Subjective: Pain better.   Abtx:  Anti-infectives    Start     Dose/Rate Route Frequency Ordered Stop   10/08/14 1300  imipenem-cilastatin (PRIMAXIN) 500 mg in sodium chloride 0.9 % 100 mL IVPB     500 mg 200 mL/hr over 30 Minutes Intravenous Every 8 hours 10/08/14 1250     10/07/14 0400  ciprofloxacin (CIPRO) IVPB 400 mg  Status:  Discontinued     400 mg 200 mL/hr over 60 Minutes Intravenous Every 12 hours 10/06/14 1700 10/08/14 1237   10/06/14 1630  metroNIDAZOLE (FLAGYL) IVPB 500 mg  Status:  Discontinued     500 mg 100 mL/hr over 60 Minutes Intravenous Every 8 hours 10/06/14 1625 10/08/14 1237   10/06/14 1545  ciprofloxacin (CIPRO) IVPB 400 mg     400 mg 200 mL/hr over 60 Minutes Intravenous  Once 10/06/14 1533 10/06/14 1652   10/06/14 1545  metroNIDAZOLE (FLAGYL) IVPB 500 mg  Status:  Discontinued     500 mg 100 mL/hr over 60 Minutes Intravenous  Once 10/06/14 1533 10/06/14 1629      Medications:  Scheduled: . feeding supplement (RESOURCE BREEZE)  1 Container Oral TID BM  . folic acid  1 mg Intravenous Daily  . imipenem-cilastatin (PRIMAXIN) IVPB  500 mg Intravenous Q8H  . thiamine  100 mg Intravenous Daily    Objective: Vital signs in last 24 hours: Temp:  [97.7 F (36.5 C)-98.8 F (37.1 C)] 98.1 F (36.7 C) (05/03 0512) Pulse Rate:  [62-86] 64 (05/03 0512) Resp:  [12-18] 18 (05/03 0512) BP: (74-102)/(40-66) 89/56 mmHg (05/03 0512) SpO2:  [95 %-100 %] 95 % (05/03 0512) Weight:  [62.4 kg (137 lb 9.1 oz)] 62.4 kg  (137 lb 9.1 oz) (05/02 1824)   General appearance: alert, cooperative and no distress Resp: clear to auscultation bilaterally Cardio: regular rate and rhythm GI: normal findings: minimal tenderness and abnormal findings:  hypoactive bowel sounds  Lab Results  Recent Labs  10/09/14 0300 10/09/14 0936  WBC 3.4*  --   HGB 10.0*  --   HCT 29.0*  --   NA 141 142  K 2.8* 2.8*  CL 118* 116*  CO2 14* 17*  BUN 6 5*  CREATININE 1.04 0.96   Liver Panel  Recent Labs  10/09/14 0300  PROT 4.4*  ALBUMIN 2.1*  AST 32  ALT 27  ALKPHOS 63  BILITOT 1.0   Sedimentation Rate No results for input(s): ESRSEDRATE in the last 72 hours. C-Reactive Protein No results for input(s): CRP in the last 72 hours.  Microbiology: Recent Results (from the past 240 hour(s))  Culture, blood (routine x 2)     Status: None (Preliminary result)   Collection Time: 10/06/14  3:39 PM  Result Value Ref Range Status   Specimen Description BLOOD RIGHT WRIST  Final   Special Requests BOTTLES DRAWN AEROBIC AND ANAEROBIC 5CC  Final   Culture   Final           BLOOD CULTURE RECEIVED NO GROWTH TO DATE  CULTURE WILL BE HELD FOR 5 DAYS BEFORE ISSUING A FINAL NEGATIVE REPORT [facility]    Report Status PENDING  Incomplete  Culture, blood (routine x 2)     Status: None (Preliminary result)   Collection Time: 10/06/14  3:43 PM  Result Value Ref Range Status   Specimen Description BLOOD RIGHT ARM  Final   Special Requests BOTTLES DRAWN AEROBIC AND ANAEROBIC 5CC  Final   Culture   Final           BLOOD CULTURE RECEIVED NO GROWTH TO DATE CULTURE WILL BE HELD FOR 5 DAYS BEFORE ISSUING A FINAL NEGATIVE REPORT [facility]formed at [facility]    Report Status PENDING  Incomplete  MRSA PCR Screening     Status: None   Collection Time: 10/06/14  6:58 PM  Result Value Ref Range Status   MRSA by PCR NEGATIVE NEGATIVE Final    Comment:        The GeneXpert MRSA Assay (FDA approved for NASAL  specimens only), is one component of a comprehensive MRSA colonization surveillance program. It is not intended to diagnose MRSA infection nor to guide or monitor treatment for MRSA infections.   Clostridium Difficile by PCR     Status: None   Collection Time: 10/08/14  9:00 AM  Result Value Ref Range Status   C difficile by pcr NEGATIVE NEGATIVE Final  Culture, blood (x 2)     Status: None (Preliminary result)   Collection Time: 10/08/14  1:55 PM  Result Value Ref Range Status   Specimen Description BLOOD LEFT HAND  Final   Special Requests BOTTLES DRAWN AEROBIC ONLY  9CC  Final   Culture   Final           BLOOD CULTURE RECEIVED NO GROWTH TO DATE CULTURE WILL BE HELD FOR 5 DAYS BEFORE ISSUING A FINAL NEGATIVE REPORT [facility]formed at [facility]    Report Status PENDING  Incomplete  Culture, blood (x 2)     Status: None (Preliminary result)   Collection Time: 10/08/14  2:03 PM  Result Value Ref Range Status   Specimen Description BLOOD RIGHT HAND  Final   Special Requests BOTTLES DRAWN AEROBIC ONLY  3CC  Final   Culture   Final           BLOOD CULTURE RECEIVED NO GROWTH TO DATE CULTURE WILL BE HELD FOR 5 DAYS BEFORE ISSUING A FINAL NEGATIVE REPORT Performed at [facility]    Report Status PENDING  Incomplete    Studies/Results: Dg Ercp Biliary & Pancreatic Ducts  10/09/2014   CLINICAL DATA:  Cholelithiasis  EXAM: ERCP  TECHNIQUE: Multiple spot images obtained with the fluoroscopic device and submitted for interpretation post-procedure.  FLUOROSCOPY TIME:  3 minutes 38 seconds  COMPARISON:  MR 10/07/2014  FINDINGS: Four fluoroscopic spot images document endoscopic cannulation and opacification of the central biliary tree. Possible filling defects in the mid CBD on the initial image. Subsequent Images document passage of a balloon catheter through the CBD. The intrahepatic bile ducts are incompletely opacified, appearing decompressed centrally. Some contrast flows  through the patent cystic duct into the gallbladder. No extravasation identified.  IMPRESSION: 1. ERCP with balloon sweep of the common duct. These images were submitted for radiologic interpretation only. Please see the procedural report for the amount of contrast and the fluoroscopy time utilized.   Electronically Signed   By: Corlis Leak  Hassell M.D.   On: 10/09/2014 14:12  Assessment/Plan: HIV+ Diarrhea hypokalemia Choledocholithiasis AKI Wt loss Hypotension  Total days of antibiotics: 6 (imipenem)  He is to have cholecystectomy tomorrow Worsening BP from GI tract manipulation? Watch BP, we may need to broaden anbx Will start azithro, bactrim for OI Await HIV genotype.  Leave in isolation til we have GI pathogen panel quantiferon gold, My appreciations to Dr Beckie Busing Infectious Diseases (pager) [phone] [email or web address] 10/10/2014, 11:45 AM  LOS: 4 days

## 2014-10-10 NOTE — Progress Notes (Signed)
Patient ID: Jon Love, male   DOB: [birth year], 39 y.o.   MRN: [id number]     [facility]      [address] [place], [place] [address]    Phone: [phone] FAX: [phone]     Subjective: BP remains low despite IVF bolus, maintenance fluids at 125.  Jon Love reports briefly having dizziness upon standing first thing in the morning when Jon Love gets up to the restroom.  Denies sob, cp or syncopal symptoms.  ERCP yesterday.  K repleted, no follow up labs.   Spoke via pacific interpreter   Objective:  Vital signs:  Filed Vitals:   10/10/14 0145 10/10/14 0207 10/10/14 0312 10/10/14 0512  BP: 83/53 81/52 83/56 89/56  Pulse: 64   64  Temp: 98.8 F (37.1 C)   98.1 F (36.7 C)  TempSrc: Oral   Oral  Resp:    18  Height:      Weight:      SpO2: 97%   95%    Last BM Date: 10/09/14  Intake/Output   Yesterday:  05/02 0701 - 05/03 0700 In: 1890 [P.O.:740; I.V.:750; IV Piggyback:400] Out: 3600 [Urine:3600] This shift:  Total I/O In: -  Out: 200 [Urine:200]   Physical Exam: General: Pt awake/alert/oriented x4 in no acute distress  Abdomen: Soft.  Nondistended.  Non tender.  No evidence of peritonitis.  No incarcerated hernias.    Problem List:   Principal Problem:   Choledocholithiasis Active Problems:   Hypokalemia   Acute renal failure   Abnormal transaminases   Metabolic acidosis   Abnormal CT of the abdomen with biliary ductal dilitation   Diarrhea   Hyponatremia   Cholelithiasis   Protein-calorie malnutrition, severe   Intrahepatic bile duct stones   Hypotension   HIV antibody positive   Sepsis    Results:   Labs: Results for orders placed or performed during the hospital encounter of 10/06/14 (from the past 48 hour(s))  Culture, blood (x 2)     Status: None (Preliminary result)   Collection Time: 10/08/14  1:55 PM  Result Value Ref Range   Specimen Description BLOOD LEFT HAND    Special Requests  BOTTLES DRAWN AEROBIC ONLY  [place]    Culture             BLOOD CULTURE RECEIVED NO GROWTH TO DATE CULTURE WILL BE HELD FOR 5 DAYS BEFORE ISSUING A FINAL NEGATIVE REPORT Performed at [facility]    Report Status PENDING   Culture, blood (x 2)     Status: None (Preliminary result)   Collection Time: 10/08/14  2:03 PM  Result Value Ref Range   Specimen Description BLOOD RIGHT HAND    Special Requests BOTTLES DRAWN AEROBIC ONLY  3[facility]    Culture             BLOOD CULTURE RECEIVED NO GROWTH TO DATE CULTURE WILL BE HELD FOR 5 DAYS BEFORE ISSUING A FINAL NEGATIVE REPORT Performed at [facility]    Report Status PENDING   Lactic acid, plasma     Status: None   Collection Time: 10/08/14  2:15 PM  Result Value Ref Range   Lactic Acid, Venous 1.6 0.5 - 2.0 mmol/L  Procalcitonin     Status: None   Collection Time: 10/08/14  2:15 PM  Result Value Ref Range   Procalcitonin <0.10 ng/mL    Comment:        Interpretation: PCT (Procalcitonin) <= 0.5  ng/mL: Systemic infection (sepsis) is not likely. Local bacterial infection is possible. (NOTE)         ICU PCT Algorithm               Non ICU PCT Algorithm    ----------------------------     ------------------------------         PCT < 0.25 ng/mL                 PCT < 0.1 ng/mL     Stopping of antibiotics            Stopping of antibiotics       strongly encouraged.               strongly encouraged.    ----------------------------     ------------------------------       PCT level decrease by               PCT < 0.25 ng/mL       >= 80% from peak PCT       OR PCT 0.25 - 0.5 ng/mL          Stopping of antibiotics                                             encouraged.     Stopping of antibiotics           encouraged.    ----------------------------     ------------------------------       PCT level decrease by              PCT >= 0.25 ng/mL       < 80% from peak PCT        AND PCT >= 0.5 ng/mL            Continuin g  antibiotics                                              encouraged.       Continuing antibiotics            encouraged.    ----------------------------     ------------------------------     PCT level increase compared          PCT > 0.5 ng/mL         with peak PCT AND          PCT >= 0.5 ng/mL             Escalation of antibiotics                                          strongly encouraged.      Escalation of antibiotics        strongly encouraged.   Protime-INR     Status: Abnormal   Collection Time: 10/08/14  2:15 PM  Result Value Ref Range   Prothrombin Time 26.2 (H) 11.6 - 15.2 seconds   INR 2.38 (H) 0.00 - 1.49  APTT     Status: None   Collection Time: 10/08/14  2:15 PM  Result Value Ref Range   aPTT 37 24 -  37 seconds    Comment:        IF BASELINE aPTT IS ELEVATED, SUGGEST PATIENT RISK ASSESSMENT BE USED TO DETERMINE APPROPRIATE ANTICOAGULANT THERAPY.   Basic metabolic panel     Status: Abnormal   Collection Time: 10/08/14  2:15 PM  Result Value Ref Range   Sodium 140 135 - 145 mmol/L   Potassium 3.4 (L) 3.5 - 5.1 mmol/L    Comment: DELTA CHECK NOTED   Chloride 123 (H) 101 - 111 mmol/L   CO2 13 (L) 22 - 32 mmol/L   Glucose, Bld 79 70 - 99 mg/dL   BUN 9 6 - 20 mg/dL   Creatinine, Ser 1.00 0.61 - 1.24 mg/dL   Calcium 7.3 (L) 8.9 - 10.3 mg/dL   GFR calc non Af Amer >60 >60 mL/min   GFR calc Af Amer >60 >60 mL/min    Comment: (NOTE) The eGFR has been calculated using the CKD EPI equation. This calculation has not been validated in all clinical situations. eGFR's persistently <90 mL/min signify possible Chronic Kidney Disease.    Anion gap 4 (L) 5 - 15  T-helper cells (CD4) count     Status: Abnormal   Collection Time: 10/08/14  8:49 PM  Result Value Ref Range   CD4 T Cell Abs 10 (L) 400 - 2700 /uL   CD4 % Helper T Cell 2 (L) 33 - 55 %    Comment: Performed at [facility]  RPR     Status: None   Collection Time: 10/08/14  8:49 PM  Result  Value Ref Range   RPR Ser Ql Non Reactive Non Reactive    Comment: (NOTE) Performed At: [facility] [place], [place] [id number] Lindon Romp MD VO:[id number]   Comprehensive metabolic panel     Status: Abnormal   Collection Time: 10/09/14  3:00 AM  Result Value Ref Range   Sodium 141 135 - 145 mmol/L   Potassium 2.8 (L) 3.5 - 5.1 mmol/L    Comment: DELTA CHECK NOTED   Chloride 118 (H) 101 - 111 mmol/L   CO2 14 (L) 22 - 32 mmol/L   Glucose, Bld 80 70 - 99 mg/dL   BUN 6 6 - 20 mg/dL   Creatinine, Ser 1.04 0.61 - 1.24 mg/dL   Calcium 7.6 (L) 8.9 - 10.3 mg/dL   Total Protein 4.4 (L) 6.5 - 8.1 g/dL   Albumin 2.1 (L) 3.5 - 5.0 g/dL   AST 32 15 - 41 U/L   ALT 27 17 - 63 U/L   Alkaline Phosphatase 63 38 - 126 U/L   Total Bilirubin 1.0 0.3 - 1.2 mg/dL   GFR calc non Af Amer >60 >60 mL/min   GFR calc Af Amer >60 >60 mL/min    Comment: (NOTE) The eGFR has been calculated using the CKD EPI equation. This calculation has not been validated in all clinical situations. eGFR's persistently <90 mL/min signify possible Chronic Kidney Disease.    Anion gap 9 5 - 15  CBC with Differential/Platelet     Status: Abnormal   Collection Time: 10/09/14  3:00 AM  Result Value Ref Range   WBC 3.4 (L) 4.0 - 10.5 K/uL   RBC 3.92 (L) 4.22 - 5.81 MIL/uL   Hemoglobin 10.0 (L) 13.0 - 17.0 g/dL   HCT 29.0 (L) 39.0 - 52.0 %   MCV 74.0 (L) 78.0 - 100.0 fL   MCH 25.5 (L) 26.0 - 34.0 pg   MCHC  34.5 30.0 - 36.0 g/dL   RDW 15.2 11.5 - 15.5 %   Platelets 144 (L) 150 - 400 K/uL   Neutrophils Relative % 48 43 - 77 %   Neutro Abs 1.6 (L) 1.7 - 7.7 K/uL   Lymphocytes Relative 24 12 - 46 %   Lymphs Abs 0.8 0.7 - 4.0 K/uL   Monocytes Relative 6 3 - 12 %   Monocytes Absolute 0.2 0.1 - 1.0 K/uL   Eosinophils Relative 22 (H) 0 - 5 %   Eosinophils Absolute 0.7 0.0 - 0.7 K/uL   Basophils Relative 0 0 - 1 %   Basophils Absolute 0.0 0.0 - 0.1 K/uL  Magnesium     Status: Abnormal    Collection Time: 10/09/14  3:00 AM  Result Value Ref Range   Magnesium 1.3 (L) 1.7 - 2.4 mg/dL  Protime-INR     Status: Abnormal   Collection Time: 10/09/14  9:36 AM  Result Value Ref Range   Prothrombin Time 19.0 (H) 11.6 - 15.2 seconds   INR 1.58 (H) 0.00 - 8.36  Basic metabolic panel     Status: Abnormal   Collection Time: 10/09/14  9:36 AM  Result Value Ref Range   Sodium 142 135 - 145 mmol/L   Potassium 2.8 (L) 3.5 - 5.1 mmol/L   Chloride 116 (H) 101 - 111 mmol/L   CO2 17 (L) 22 - 32 mmol/L   Glucose, Bld 87 70 - 99 mg/dL   BUN 5 (L) 6 - 20 mg/dL   Creatinine, Ser 0.96 0.61 - 1.24 mg/dL   Calcium 7.5 (L) 8.9 - 10.3 mg/dL   GFR calc non Af Amer >60 >60 mL/min   GFR calc Af Amer >60 >60 mL/min    Comment: (NOTE) The eGFR has been calculated using the CKD EPI equation. This calculation has not been validated in all clinical situations. eGFR's persistently <90 mL/min signify possible Chronic Kidney Disease.    Anion gap 9 5 - 15    Imaging / Studies: Dg Ercp Biliary & Pancreatic Ducts  10/09/2014   CLINICAL DATA:  Cholelithiasis  EXAM: ERCP  TECHNIQUE: Multiple spot images obtained with the fluoroscopic device and submitted for interpretation post-procedure.  FLUOROSCOPY TIME:  3 minutes 38 seconds  COMPARISON:  MR 10/07/2014  FINDINGS: Four fluoroscopic spot images document endoscopic cannulation and opacification of the central biliary tree. Possible filling defects in the mid CBD on the initial image. Subsequent Images document passage of a balloon catheter through the CBD. The intrahepatic bile ducts are incompletely opacified, appearing decompressed centrally. Some contrast flows through the patent cystic duct into the gallbladder. No extravasation identified.  IMPRESSION: 1. ERCP with balloon sweep of the common duct. These images were submitted for radiologic interpretation only. Please see the procedural report for the amount of contrast and the fluoroscopy time utilized.    Electronically Signed   By: Lucrezia Europe M.D.   On: 10/09/2014 14:12    Medications / Allergies:  Scheduled Meds: . feeding supplement (RESOURCE BREEZE)  1 Container Oral TID BM  . folic acid  1 mg Intravenous Daily  . imipenem-cilastatin (PRIMAXIN) IVPB  500 mg Intravenous Q8H  . thiamine  100 mg Intravenous Daily   Continuous Infusions: . sodium chloride 125 mL/hr at 10/10/14 0509   PRN Meds:.HYDROcodone-acetaminophen, morphine injection, ondansetron **OR** ondansetron (ZOFRAN) IV  Antibiotics: Anti-infectives    Start     Dose/Rate Route Frequency Ordered Stop   10/08/14 1300  imipenem-cilastatin (PRIMAXIN) 500 mg  in sodium chloride 0.9 % 100 mL IVPB     500 mg 200 mL/hr over 30 Minutes Intravenous Every 8 hours 10/08/14 1250     10/07/14 0400  ciprofloxacin (CIPRO) IVPB 400 mg  Status:  Discontinued     400 mg 200 mL/hr over 60 Minutes Intravenous Every 12 hours 10/06/14 1700 10/08/14 1237   10/06/14 1630  metroNIDAZOLE (FLAGYL) IVPB 500 mg  Status:  Discontinued     500 mg 100 mL/hr over 60 Minutes Intravenous Every 8 hours 10/06/14 1625 10/08/14 1237   10/06/14 1545  ciprofloxacin (CIPRO) IVPB 400 mg     400 mg 200 mL/hr over 60 Minutes Intravenous  Once 10/06/14 1533 10/06/14 1652   10/06/14 1545  metroNIDAZOLE (FLAGYL) IVPB 500 mg  Status:  Discontinued     500 mg 100 mL/hr over 60 Minutes Intravenous  Once 10/06/14 1533 10/06/14 1629        Assessment/Plan New HIV diagnosis - ID following  Coagulopathy- INR yesterday 1.5.  INR in AM to ensure it is <2 pre op Hypokalemia-repeat bmp today and tomorrow, replenish per primary team if needed  Choledocholithiasis -ERCP 5/2 - Janelle Floor.  Her removed 3 black stones.  Plan for cholecystectomy tomorrow.  i discussed the surgery in length with the patient via telephone interpreter.  Risks including but not limited to infection, bleeding, injury to surrounding structures, anesthesia risks were discussed.  The patient  verbalizes understanding and wishes to proceed.  Obtain consent. May have clears, NPO after midnight.  On primaxin Hypotension-IVF, adequate UOP.  Per primary team  Dispo-to OR in AM  Erby Pian, [facility] Pager [phone](7A-4:30P) For consults and floor pages call [phone](7A-4:30P)  10/10/2014 9:32 AM  Agree with above.  Alphonsa Overall, MD, [facility] Pager: [phone] Office phone:  [phone]

## 2014-10-10 NOTE — Progress Notes (Signed)
eLink Physician-Brief Progress Note Patient Name: Jon Love DOB: Sep 17, 1975 MRN: [id number]   Date of Service  10/10/2014  HPI/Events of Note  Continued hypotension with BP of 74/40.  Remains alert in NAD.  eICU Interventions  Plan: 1 liter of NS IV now Increase IVF rate to 125 cc/hr Albumin 5% times one dose     Intervention Category Intermediate Interventions: Hypotension - evaluation and management  DETERDING,ELIZABETH 10/10/2014, 12:45 AM

## 2014-10-10 NOTE — Progress Notes (Signed)
Patient's blood pressure 89/56, pt complaining of pain of 7/10.  MD paged to make sure it was okay to give 2 tablets of vicodin 5-325mg . MD ordered to give medication.

## 2014-10-11 ENCOUNTER — Encounter (HOSPITAL_COMMUNITY)
Admission: EM | Disposition: A | Discharge status: Home / Self Care | Payer: Self-pay | Source: Home / Self Care | Attending: Internal Medicine

## 2014-10-11 ENCOUNTER — Inpatient Hospital Stay (HOSPITAL_COMMUNITY): Payer: Self-pay

## 2014-10-11 ENCOUNTER — Inpatient Hospital Stay (HOSPITAL_COMMUNITY): Payer: Self-pay | Admitting: Certified Registered Nurse Anesthetist

## 2014-10-11 ENCOUNTER — Inpatient Hospital Stay (HOSPITAL_COMMUNITY): Payer: MEDICAID | Admitting: Certified Registered Nurse Anesthetist

## 2014-10-11 DIAGNOSIS — R05 Cough: Secondary | ICD-10-CM

## 2014-10-11 DIAGNOSIS — B20 Human immunodeficiency virus [HIV] disease: Secondary | ICD-10-CM

## 2014-10-11 DIAGNOSIS — R059 Cough, unspecified: Secondary | ICD-10-CM | POA: Insufficient documentation

## 2014-10-11 DIAGNOSIS — E46 Unspecified protein-calorie malnutrition: Secondary | ICD-10-CM

## 2014-10-11 DIAGNOSIS — K810 Acute cholecystitis: Secondary | ICD-10-CM

## 2014-10-11 DIAGNOSIS — E43 Unspecified severe protein-calorie malnutrition: Secondary | ICD-10-CM

## 2014-10-11 HISTORY — PX: CHOLECYSTECTOMY: SHX55

## 2014-10-11 LAB — GIARDIA/CRYPTOSPORIDIUM SCREEN(EIA)
CRYPTOSPORIDIUM SCREEN (EIA): NEGATIVE
GIARDIA SCREEN - EIA: NEGATIVE

## 2014-10-11 LAB — GI PATHOGEN PANEL BY PCR, STOOL
C difficile toxin A/B: NOT DETECTED
CAMPYLOBACTER BY PCR: NOT DETECTED
Cryptosporidium by PCR: NOT DETECTED
E COLI (STEC): NOT DETECTED
E coli (ETEC) LT/ST: NOT DETECTED
E coli 0157 by PCR: NOT DETECTED
G lamblia by PCR: NOT DETECTED
Norovirus GI/GII: NOT DETECTED
ROTAVIRUS A BY PCR: NOT DETECTED
Salmonella by PCR: NOT DETECTED
Shigella by PCR: NOT DETECTED

## 2014-10-11 LAB — BASIC METABOLIC PANEL
Anion gap: 8 (ref 5–15)
BUN: <5 mg/dL — ABNORMAL LOW (ref 6–20)
CO2: 28 mmol/L (ref 22–32)
Calcium: 6.7 mg/dL — ABNORMAL LOW (ref 8.9–10.3)
Chloride: 104 mmol/L (ref 101–111)
Creatinine, Ser: 0.73 mg/dL (ref 0.61–1.24)
Glucose, Bld: 90 mg/dL (ref 70–99)
POTASSIUM: 2.8 mmol/L — AB (ref 3.5–5.1)
Sodium: 140 mmol/L (ref 135–145)

## 2014-10-11 LAB — PROTIME-INR
INR: 1.46 (ref 0.00–1.49)
PROTHROMBIN TIME: 17.9 s — AB (ref 11.6–15.2)

## 2014-10-11 LAB — SURGICAL PCR SCREEN
MRSA, PCR: NEGATIVE
Staphylococcus aureus: NEGATIVE

## 2014-10-11 LAB — CORTISOL-AM, BLOOD: CORTISOL - AM: 13.1 ug/dL (ref 6.7–22.6)

## 2014-10-11 SURGERY — LAPAROSCOPIC CHOLECYSTECTOMY WITH INTRAOPERATIVE CHOLANGIOGRAM
Anesthesia: General | Site: Abdomen

## 2014-10-11 MED ORDER — PROPOFOL 10 MG/ML IV BOLUS
INTRAVENOUS | Status: DC | PRN
  Administered 2014-10-11: 200 mg via INTRAVENOUS

## 2014-10-11 MED ORDER — SODIUM CHLORIDE 0.9 % IV SOLN
INTRAVENOUS | Status: DC | PRN
  Administered 2014-10-11: 50 mL

## 2014-10-11 MED ORDER — MIDAZOLAM HCL 2 MG/2ML IJ SOLN
INTRAMUSCULAR | Status: AC
  Filled 2014-10-11: qty 2

## 2014-10-11 MED ORDER — BUPIVACAINE-EPINEPHRINE (PF) 0.25% -1:200000 IJ SOLN
INTRAMUSCULAR | Status: AC
  Filled 2014-10-11: qty 30

## 2014-10-11 MED ORDER — 0.9 % SODIUM CHLORIDE (POUR BTL) OPTIME
TOPICAL | Status: DC | PRN
  Administered 2014-10-11: 1000 mL

## 2014-10-11 MED ORDER — PHENYLEPHRINE HCL 10 MG/ML IJ SOLN
INTRAMUSCULAR | Status: DC | PRN
  Administered 2014-10-11 (×7): 80 ug via INTRAVENOUS

## 2014-10-11 MED ORDER — STERILE WATER FOR INJECTION IJ SOLN
INTRAMUSCULAR | Status: AC
  Filled 2014-10-11: qty 10

## 2014-10-11 MED ORDER — GLYCOPYRROLATE 0.2 MG/ML IJ SOLN
INTRAMUSCULAR | Status: DC | PRN
  Administered 2014-10-11: 0.6 mg via INTRAVENOUS

## 2014-10-11 MED ORDER — EPHEDRINE SULFATE 50 MG/ML IJ SOLN
INTRAMUSCULAR | Status: AC
Start: 2014-10-11 — End: 2014-10-11
  Filled 2014-10-11: qty 1

## 2014-10-11 MED ORDER — MIDAZOLAM HCL 5 MG/5ML IJ SOLN
INTRAMUSCULAR | Status: DC | PRN
  Administered 2014-10-11: 2 mg via INTRAVENOUS

## 2014-10-11 MED ORDER — SODIUM CHLORIDE 0.9 % IR SOLN
Status: DC | PRN
  Administered 2014-10-11: 1000 mL

## 2014-10-11 MED ORDER — ONDANSETRON HCL 4 MG/2ML IJ SOLN
INTRAMUSCULAR | Status: DC | PRN
  Administered 2014-10-11: 4 mg via INTRAVENOUS

## 2014-10-11 MED ORDER — MORPHINE SULFATE 2 MG/ML IJ SOLN
1.0000 mg | INTRAMUSCULAR | Status: DC | PRN
  Administered 2014-10-12 (×3): 4 mg via INTRAVENOUS
  Filled 2014-10-11 (×3): qty 2

## 2014-10-11 MED ORDER — ROCURONIUM BROMIDE 50 MG/5ML IV SOLN
INTRAVENOUS | Status: AC
  Filled 2014-10-11: qty 1

## 2014-10-11 MED ORDER — PROPOFOL 10 MG/ML IV BOLUS
INTRAVENOUS | Status: AC
  Filled 2014-10-11: qty 20

## 2014-10-11 MED ORDER — EPHEDRINE SULFATE 50 MG/ML IJ SOLN
INTRAMUSCULAR | Status: DC | PRN
  Administered 2014-10-11: 10 mg via INTRAVENOUS

## 2014-10-11 MED ORDER — FENTANYL CITRATE (PF) 250 MCG/5ML IJ SOLN
INTRAMUSCULAR | Status: AC
  Filled 2014-10-11: qty 5

## 2014-10-11 MED ORDER — OXYCODONE HCL 5 MG PO TABS: 5.0000 mg | ORAL_TABLET | Freq: Once | ORAL | Status: DC | PRN

## 2014-10-11 MED ORDER — FENTANYL CITRATE (PF) 100 MCG/2ML IJ SOLN
INTRAMUSCULAR | Status: AC
Start: 2014-10-11 — End: 2014-10-11
  Administered 2014-10-11: 50 ug via INTRAVENOUS
  Filled 2014-10-11: qty 2

## 2014-10-11 MED ORDER — POTASSIUM CHLORIDE CRYS ER 20 MEQ PO TBCR
40.0000 meq | EXTENDED_RELEASE_TABLET | Freq: Every day | ORAL | Status: DC
  Administered 2014-10-11 – 2014-10-12 (×4): 40 meq via ORAL
  Filled 2014-10-11 (×6): qty 2

## 2014-10-11 MED ORDER — ROCURONIUM BROMIDE 100 MG/10ML IV SOLN
INTRAVENOUS | Status: DC | PRN
  Administered 2014-10-11: 30 mg via INTRAVENOUS

## 2014-10-11 MED ORDER — LACTATED RINGERS IV SOLN
INTRAVENOUS | Status: DC | PRN
  Administered 2014-10-11: 08:00:00 via INTRAVENOUS

## 2014-10-11 MED ORDER — FENTANYL CITRATE (PF) 100 MCG/2ML IJ SOLN
25.0000 ug | INTRAMUSCULAR | Status: DC | PRN
  Administered 2014-10-11: 50 ug via INTRAVENOUS

## 2014-10-11 MED ORDER — FENTANYL CITRATE (PF) 100 MCG/2ML IJ SOLN
INTRAMUSCULAR | Status: DC | PRN
Start: 2014-10-11 — End: 2014-10-11
  Administered 2014-10-11: 100 ug via INTRAVENOUS

## 2014-10-11 MED ORDER — SUCCINYLCHOLINE CHLORIDE 20 MG/ML IJ SOLN
INTRAMUSCULAR | Status: AC
  Filled 2014-10-11: qty 1

## 2014-10-11 MED ORDER — BUPIVACAINE-EPINEPHRINE 0.25% -1:200000 IJ SOLN
INTRAMUSCULAR | Status: DC | PRN
  Administered 2014-10-11: 30 mL

## 2014-10-11 MED ORDER — ACETAMINOPHEN 325 MG PO TABS: 325.0000 mg | ORAL_TABLET | ORAL | Status: DC | PRN

## 2014-10-11 MED ORDER — LIDOCAINE HCL (CARDIAC) 20 MG/ML IV SOLN
INTRAVENOUS | Status: DC | PRN
  Administered 2014-10-11: 80 mg via INTRAVENOUS

## 2014-10-11 MED ORDER — ACETAMINOPHEN 160 MG/5ML PO SOLN: 325.0000 mg | ORAL | Status: DC | PRN

## 2014-10-11 MED ORDER — OXYCODONE HCL 5 MG/5ML PO SOLN: 5.0000 mg | Freq: Once | ORAL | Status: DC | PRN

## 2014-10-11 MED ORDER — NEOSTIGMINE METHYLSULFATE 10 MG/10ML IV SOLN
INTRAVENOUS | Status: DC | PRN
Start: 2014-10-11 — End: 2014-10-11
  Administered 2014-10-11: 4 mg via INTRAVENOUS

## 2014-10-11 SURGICAL SUPPLY — 40 items
APPLIER CLIP 5 13 M/L LIGAMAX5 (MISCELLANEOUS) ×3
BLADE SURG CLIPPER 3M 9600 (MISCELLANEOUS) ×3 IMPLANT
CANISTER SUCTION 2500CC (MISCELLANEOUS) ×3 IMPLANT
CHLORAPREP W/TINT 26ML (MISCELLANEOUS) ×3 IMPLANT
CLIP APPLIE 5 13 M/L LIGAMAX5 (MISCELLANEOUS) ×1 IMPLANT
COVER MAYO STAND STRL (DRAPES) ×3 IMPLANT
COVER SURGICAL LIGHT HANDLE (MISCELLANEOUS) ×3 IMPLANT
DRAPE C-ARM 42X72 X-RAY (DRAPES) ×3 IMPLANT
DRAPE LAPAROSCOPIC ABDOMINAL (DRAPES) ×3 IMPLANT
ELECT REM PT RETURN 9FT ADLT (ELECTROSURGICAL) ×3
ELECTRODE REM PT RTRN 9FT ADLT (ELECTROSURGICAL) ×1 IMPLANT
GLOVE BIO SURGEON STRL SZ7 (GLOVE) ×3 IMPLANT
GLOVE BIOGEL PI IND STRL 7.0 (GLOVE) ×1 IMPLANT
GLOVE BIOGEL PI IND STRL 7.5 (GLOVE) ×1 IMPLANT
GLOVE BIOGEL PI INDICATOR 7.0 (GLOVE) ×2
GLOVE BIOGEL PI INDICATOR 7.5 (GLOVE) ×2
GLOVE ECLIPSE 7.5 STRL STRAW (GLOVE) ×3 IMPLANT
GLOVE SURG SIGNA 7.5 PF LTX (GLOVE) ×3 IMPLANT
GLOVE SURG SS PI 7.0 STRL IVOR (GLOVE) ×3 IMPLANT
GOWN STRL REUS W/ TWL LRG LVL3 (GOWN DISPOSABLE) ×2 IMPLANT
GOWN STRL REUS W/ TWL XL LVL3 (GOWN DISPOSABLE) ×1 IMPLANT
GOWN STRL REUS W/TWL LRG LVL3 (GOWN DISPOSABLE) ×4
GOWN STRL REUS W/TWL XL LVL3 (GOWN DISPOSABLE) ×2
KIT BASIN OR (CUSTOM PROCEDURE TRAY) ×3 IMPLANT
KIT ROOM TURNOVER OR (KITS) ×3 IMPLANT
LIQUID BAND (GAUZE/BANDAGES/DRESSINGS) ×3 IMPLANT
NS IRRIG 1000ML POUR BTL (IV SOLUTION) ×3 IMPLANT
PAD ARMBOARD 7.5X6 YLW CONV (MISCELLANEOUS) ×3 IMPLANT
POUCH SPECIMEN RETRIEVAL 10MM (ENDOMECHANICALS) ×3 IMPLANT
SCISSORS LAP 5X35 DISP (ENDOMECHANICALS) ×3 IMPLANT
SET CHOLANGIOGRAPH 5 50 .035 (SET/KITS/TRAYS/PACK) ×3 IMPLANT
SET IRRIG TUBING LAPAROSCOPIC (IRRIGATION / IRRIGATOR) ×3 IMPLANT
SLEEVE ENDOPATH XCEL 5M (ENDOMECHANICALS) ×6 IMPLANT
SPECIMEN JAR SMALL (MISCELLANEOUS) ×3 IMPLANT
SUT MON AB 4-0 PC3 18 (SUTURE) ×3 IMPLANT
TOWEL OR 17X26 10 PK STRL BLUE (TOWEL DISPOSABLE) ×3 IMPLANT
TRAY LAPAROSCOPIC (CUSTOM PROCEDURE TRAY) ×3 IMPLANT
TROCAR XCEL BLUNT TIP 100MML (ENDOMECHANICALS) ×3 IMPLANT
TROCAR XCEL NON-BLD 5MMX100MML (ENDOMECHANICALS) ×3 IMPLANT
TUBING INSUFFLATION (TUBING) ×3 IMPLANT

## 2014-10-11 NOTE — Progress Notes (Addendum)
Nutrition Follow-up  DOCUMENTATION CODES:  Severe malnutrition in context of acute illness/injury  INTERVENTION:  Boost Breeze  NUTRITION DIAGNOSIS:  Malnutrition related to vomiting as evidenced by severe depletion of muscle mass, percent weight loss.  Ongoing  GOAL:  Patient will meet greater than or equal to 90% of their needs  Progression  MONITOR:  PO intake, Supplement acceptance, Diet advancement, Labs, I & O's  REASON FOR ASSESSMENT:  Malnutrition Screening Tool    ASSESSMENT: 39 yo Spanish speaking male presents with diarrhea and abdominal pain. Found to have cholelithiasis, biliary ductal dilatation concerning for choledocholithiasis.   Chart reviewed. Pt s/p cholecystectomy earlier this AM, which revealed cholecystitis with Cholelithiasis .Also s/p ERCP on 10/09/14.  ID is following and awaiting HIV genotype.  Pt was on a clear liquid diet and tolerating well (PO: 100%). He was just advanced to a full liquid diet. Boost Breeze supplement has been added recently and pt is tolerating well. RD will continue supplement.  Labs reviewed. K: 2.8, BUN <5, Calcium: 6.7.   Height:  Ht Readings from Last 1 Encounters:  10/08/14 5\' 10"  (1.778 m)    Weight:  Wt Readings from Last 1 Encounters:  10/09/14 137 lb 9.1 oz (62.4 kg)    Ideal Body Weight:  78 kg  Wt Readings from Last 10 Encounters:  10/09/14 137 lb 9.1 oz (62.4 kg)  09/17/14 133 lb 6 oz (60.499 kg)    BMI:  Body mass index is 19.74 kg/(m^2). Underweight  Estimated Nutritional Needs:  Kcal:  2000-2200  Protein:  70-80 grams  Fluid:  >/=1.7 L/day  Skin:  Reviewed, no issues  Diet Order:  Diet full liquid Room service appropriate?: Yes; Fluid consistency:: Thin Diet regular Room service appropriate?: Yes; Fluid consistency:: Thin  EDUCATION NEEDS:  No education needs identified at this time   Intake/Output Summary (Last 24 hours) at 10/11/14 1616 Last data filed at 10/11/14 1435  Gross per 24 hour  Intake    600 ml  Output   4000 ml  Net  -3400 ml    Last BM:  10/11/14  Elke Holtry A. Mayford KnifeWilliams, RD, LDN, CDE Pager: [phone] After hours Pager: [phone]

## 2014-10-11 NOTE — Progress Notes (Signed)
Patient received back s/p cholecystectomy, VSS, afebrile, A+ox4, pain controlled, will continue to monitor.

## 2014-10-11 NOTE — Progress Notes (Signed)
Report called to [phone]. Advised that transport will be here for pt at 0715 this morning. All questions were answered during report.

## 2014-10-11 NOTE — Transfer of Care (Signed)
Immediate Anesthesia Transfer of Care Note  Patient: Jon Love  Procedure(s) Performed: Procedure(s): LAPAROSCOPIC CHOLECYSTECTOMY WITH INTRAOPERATIVE CHOLANGIOGRAM (N/A)  Patient Location: PACU  Anesthesia Type:General  Level of Consciousness: awake and alert   Airway & Oxygen Therapy: Patient Spontanous Breathing and Patient connected to nasal cannula oxygen  Post-op Assessment: Report given to RN and Post -op Vital signs reviewed and stable  Post vital signs: Reviewed and stable  Last Vitals:  Filed Vitals:   10/11/14 0618  BP: 104/70  Pulse: 68  Temp: 37 C  Resp: 20    Complications: No apparent anesthesia complications

## 2014-10-11 NOTE — Progress Notes (Addendum)
Patient labeled stool sample for giardia/cryptosporidium screen (EIA) with order requisiton dropped off personally to Dennison MascotKim Nataleigh Griffin in lab.

## 2014-10-11 NOTE — Discharge Instructions (Signed)
CIRUGIA LAPAROSCOPICA: INSTRUCCIONES DE POST OPERATORIO. ° °Revise siempre los documentos que le entreguen en el lugar donde se ha hecho la cirugia. ° °SI USTED NECESITA DOCUMENTOS DE INCAPACIDAD (DISABLE) O DE PERMISO FAMILAR (FAMILY LEAVE) NECESITA TRAERLOS A LA OFICINA PARA QUE SEAN PROCESADOS. °NO  SE LOS DE A SU DOCTOR. °1. A su alta del hospital se le dara una receta para controlar el dolor. Tomela como ha sido recetada, si la necesita. Si no la necesita puede tomar, Acetaminofen (Tylenol) o Ibuprofen (Advil) para aliviar dolor moderado. °2. Continue tomando el resto de sus medicinas. °3. Si necesita rellenar la receta, llame a la farmacia. ellos contactan a nuestra oficina pidiendo autorizacion. Este tipo de receta no pueden ser rellenadas despues de las  5pm o durante los fines de semana. °4. Con relacion a la dieta: debe ser ligera los primeros dias despues que llege a la casa. Ejemplo: sopas y galleticas. Tome bastante liquido esos dias. °5. La mayoria de los pacientes padecen de inflamacion y cambio de coloracion de la piel alrededor de las incisiones. esto toma dias en resolver.  pnerse una bolsa de hielo en el area affectada ayuda..  °6. Es comun tambien tener un poco de estrenimiento si esta tomado medicinas para el dolor. incremente la cantidad de liquidos a tomar y puede tomar (Colace) esto previene el problema. Si ya tiene estrenimiento, es decir no ha defecado en 48 horas, puede tomar un laxativo (Milk of Magnesia or Miralax) uselo como el paquete le explica. °7.  A menos que se le diga algo diferente. Remueva el bendaje a las 24-48 horas despues dela cirugia. y puede banarse en la ducha sin ningun problema. usted puede tener steri-strips (pequenas curitas transparentes en la piel puesta encima de la incision)  Estas banditas strips should be left on the skin for 7-10 days.   Si su cirujano puso pegamento encima de la incision usted puede banarse bajo la ducha en 24 horas. Este pegamento empezara a  caerse en las proximas 2-3 semanas. Si le pusieron suturas o presillas (grapos) estos seran quitados en su proxima cita en la oficina. . °a. ACTIVIDADES:  Puede hacer actividad ligera.  Como caminar , subir escaleras y poco a poco irlas incrementando tanto como las tolere. Puede tener relaciones sexuales cuando sea comfortable. No carge objetos pesados o haga esfuerzos que no sean aprovados por su doctor. °b. Puede manejar en cuanto no esta tomando medicamentos fuertes (narcoticos) para el dolor, pueda abrochar confortablemente el cinturon de seguridad, y pueda maniobrar y usar los pedales de su vehiculo con seguridad. °c. PUEDE REGRESAR A TRABAJAR  °8. Debe ver a su doctor para una cita de seguimiento en 2-3 semanas despues de la cirugia.  °9. OTRAS ISNSTRUCCIONES:___________________________________________________________________________________ °CUANDO LLAMAR A SU MEDICO: °1. FIEBRE mayor de  101.0 °2. No produccion de orina. °3. Sangramiento continue de la herida °4. Incremento de dolor, enrojecimientio o drenaje de la herida (incision) °5. Incremento de dolor abdominal. ° °The clinic staff is available to answer your questions during regular business hours.  Please don’t hesitate to call and ask to speak to one of the nurses for clinical concerns.  If you have a medical emergency, go to the nearest emergency room or call 911.  A surgeon from [facility] is always on call at the hospital. °[address], [address], [place], [place]  [address] ? [address] [address], [place], [place]   [address] °[phone] ? [phone] ? FAX [phone] °Web site: [email or web address] ° ° °

## 2014-10-11 NOTE — Progress Notes (Signed)
[facility] PROGRESS NOTE  Bowie Delia HYQ:[id number] DOB: 09-21-1975 DOA: 10/06/2014 PCP: No PCP Per Patient   summary: 39 yo Spanish speaking male presents with diarrhea and abdominal pain. Found to have cholelithiasis, biliary ductal dilatation concerning for choledocholithiasis. Charted on empiric Cipro and Flagyl. Surgery and GI consultation in the emergency room. Stool studies ordered, MRCP shows common duct stone Without evidence of cholecystitis.  Transferred to ICU for refractory shock 5/1. Had ERCP, sphincterotomy. Transferred back to [facility] 5/3. Lap chole 5/4  Assessment/Plan: Choledocholithiasis/cholelithiasis: s/p ERCP sphincterotomy.  Status post laparoscopic cholecystectomy. Surgery has written to advance diet as tolerated. Active Problems:  hypotension: BPs still borderline. Continue IVF. Ambulating ok. Cortisol not low. Will stop Primaxin and monitor.   Hypokalemia continues secondary to diarrhea, but improved from yesterday. Continue IV and oral repletion. Recheck tomorrow.  Cough: Chest x-ray with mild infiltrate. Has received 6 days of IV antibiotic. Will stop antibiotic and monitor. Hypomagnesemia: Recheck tomorrow. AIDS: CD4 abs 10. on MAC and PCP prophylaxis.  quantiferon got cancelled? Will reorder.    Acute renal failure:  resolved   Metabolic acidosis resolved   Diarrhea: continues. GI pathogen panel pending. c diff neg   Hyponatremia, hypovolemic, resolved with IV hydration   Code Status:  full Family Communication:   discussed with patient via interpreter found. No family present. Patient is alert and oriented.  Disposition Plan:  Home once medically stable.   Consultants:  GI: Particia Nearing. Surgery  ID  Procedures:   ERCP sphincterotomy  Antibiotics: Cipro, Flagyl 4/29 -- 5/1 primaxin 5/1 - 5/4  HPI/Subjective: Some abd pain. No n/v. Diarrhea continues. A little dizzy. C/o cough. No nausea. Hungry and thirsty.   Objective: Filed  Vitals:   10/11/14 1048  BP: 104/66  Pulse: 54  Temp: 98.1 F (36.7 C)  Resp: 16    Intake/Output Summary (Last 24 hours) at 10/11/14 1226 Last data filed at 10/11/14 1025  Gross per 24 hour  Intake    840 ml  Output   3550 ml  Net  -2710 ml   Filed Weights   10/07/14 2102 10/08/14 1500 10/09/14 1824  Weight: 57.788 kg (127 lb 6.4 oz) 57.7 kg (127 lb 3.3 oz) 62.4 kg (137 lb 9.1 oz)    Exam:   General:  A and o. Pleasant cooperative. coughing  Cardiovascular: Regular rate rhythm without murmurs gallops rubs  Respiratory: Clear to auscultation bilaterally without wheezes rhonchi or rales  Abdomen: Soft Appropriately tender postoperatively nondistended. Incisions without drainage   Ext: No clubbing cyanosis or edema  Basic Metabolic Panel:  Recent Labs Lab 10/07/14 1640  10/08/14 1415 10/09/14 0300 10/09/14 0936 10/10/14 1100 10/11/14 0600  NA  --   < > 140 141 142 140 140  K  --   < > 3.4* 2.8* 2.8* 2.2* 2.8*  CL  --   < > 123* 118* 116* 109 104  CO2  --   < > 13* 14* 17* 22 28  GLUCOSE  --   < > 79 80 87 132* 90  BUN  --   < > 9 6 5* <5* <5*  CREATININE  --   < > 1.00 1.04 0.96 0.92 0.73  CALCIUM  --   < > 7.3* 7.6* 7.5* 6.5* 6.7*  MG 1.8  --   --  1.3*  --   --   --   < > = values in this interval not displayed. Liver Function Tests:  Recent Labs Lab  10/06/14 1159 10/07/14 0302 10/09/14 0300  AST 66* 47* 32  ALT 50 35 27  ALKPHOS 134* 89 63  BILITOT 1.8* 1.0 1.0  PROT 8.1 5.5* 4.4*  ALBUMIN 3.7 2.5* 2.1*    Recent Labs Lab 10/06/14 1159  LIPASE 36   No results for input(s): AMMONIA in the last 168 hours. CBC:  Recent Labs Lab 10/06/14 1159 10/07/14 0302 10/09/14 0300  WBC 6.4 5.0 3.4*  NEUTROABS 4.2  --  1.6*  HGB 15.5 11.3* 10.0*  HCT 43.4 31.6* 29.0*  MCV 73.1* 72.1* 74.0*  PLT 302 192 144*   Cardiac Enzymes: No results for input(s): CKTOTAL, CKMB, CKMBINDEX, TROPONINI in the last 168 hours. BNP (last 3 results) No results  for input(s): BNP in the last 8760 hours.  ProBNP (last 3 results) No results for input(s): PROBNP in the last 8760 hours.  CBG: No results for input(s): GLUCAP in the last 168 hours.  Recent Results (from the past 240 hour(s))  Culture, blood (routine x 2)     Status: None (Preliminary result)   Collection Time: 10/06/14  3:39 PM  Result Value Ref Range Status   Specimen Description BLOOD RIGHT WRIST  Final   Special Requests BOTTLES DRAWN AEROBIC AND ANAEROBIC 5CC  Final   Culture   Final           BLOOD CULTURE RECEIVED NO GROWTH TO DATE CULTURE WILL BE HELD FOR 5 DAYS BEFORE ISSUING A FINAL NEGATIVE REPORT Performed at [facility]    Report Status PENDING  Incomplete  Culture, blood (routine x 2)     Status: None (Preliminary result)   Collection Time: 10/06/14  3:43 PM  Result Value Ref Range Status   Specimen Description BLOOD RIGHT ARM  Final   Special Requests BOTTLES DRAWN AEROBIC AND ANAEROBIC 5CC  Final   Culture   Final           BLOOD CULTURE RECEIVED NO GROWTH TO DATE CULTURE WILL BE HELD FOR 5 DAYS BEFORE ISSUING A FINAL NEGATIVE REPORT Performed at [facility]    Report Status PENDING  Incomplete  MRSA PCR Screening     Status: None   Collection Time: 10/06/14  6:58 PM  Result Value Ref Range Status   MRSA by PCR NEGATIVE NEGATIVE Final    Comment:        The GeneXpert MRSA Assay (FDA approved for NASAL specimens only), is one component of a comprehensive MRSA colonization surveillance program. It is not intended to diagnose MRSA infection nor to guide or monitor treatment for MRSA infections.   Clostridium Difficile by PCR     Status: None   Collection Time: 10/08/14  9:00 AM  Result Value Ref Range Status   C difficile by pcr NEGATIVE NEGATIVE Final  Culture, blood (x 2)     Status: None (Preliminary result)   Collection Time: 10/08/14  1:55 PM  Result Value Ref Range Status   Specimen Description BLOOD LEFT HAND  Final   Special  Requests BOTTLES DRAWN AEROBIC ONLY  9CC  Final   Culture   Final           BLOOD CULTURE RECEIVED NO GROWTH TO DATE CULTURE WILL BE HELD FOR 5 DAYS BEFORE ISSUING A FINAL NEGATIVE REPORT Performed at [facility]    Report Status PENDING  Incomplete  Culture, blood (x 2)     Status: None (Preliminary result)   Collection Time: 10/08/14  2:03 PM  Result Value Ref Range Status   Specimen Description BLOOD RIGHT HAND  Final   Special Requests BOTTLES DRAWN AEROBIC ONLY  3CC  Final   Culture   Final           BLOOD CULTURE RECEIVED NO GROWTH TO DATE CULTURE WILL BE HELD FOR 5 DAYS BEFORE ISSUING A FINAL NEGATIVE REPORT Performed at [facility]    Report Status PENDING  Incomplete     Studies: Dg Chest Port 1 View  10/10/2014   CLINICAL DATA:  Cough history of sepsis, AIDS, and acute renal failure.  EXAM: PORTABLE CHEST - 1 VIEW  COMPARISON:  None.  FINDINGS: The lungs are adequately inflated. There are coarse near confluent lung markings in the left lower lobe. There are minimal similar appearing increased interstitial markings at the right lung base. The heart and pulmonary vascularity are normal. The mediastinum is normal in width. The bony thorax is unremarkable.  IMPRESSION: Interstitial and/or early alveolar pneumonia in the left lower lobe. A follow-up PA and lateral chest x-ray would be useful.   Electronically Signed   By: David  SwazilandJordan M.D.   On: 10/10/2014 13:34   Dg Ercp Biliary & Pancreatic Ducts  10/09/2014   CLINICAL DATA:  Cholelithiasis  EXAM: ERCP  TECHNIQUE: Multiple spot images obtained with the fluoroscopic device and submitted for interpretation post-procedure.  FLUOROSCOPY TIME:  3 minutes 38 seconds  COMPARISON:  MR 10/07/2014  FINDINGS: Four fluoroscopic spot images document endoscopic cannulation and opacification of the central biliary tree. Possible filling defects in the mid CBD on the initial image. Subsequent Images document passage of a balloon  catheter through the CBD. The intrahepatic bile ducts are incompletely opacified, appearing decompressed centrally. Some contrast flows through the patent cystic duct into the gallbladder. No extravasation identified.  IMPRESSION: 1. ERCP with balloon sweep of the common duct. These images were submitted for radiologic interpretation only. Please see the procedural report for the amount of contrast and the fluoroscopy time utilized.   Electronically Signed   By: Corlis Leak  Hassell M.D.   On: 10/09/2014 14:12    Scheduled Meds: . azithromycin  1,200 mg Oral Weekly  . feeding supplement (RESOURCE BREEZE)  1 Container Oral TID BM  . folic acid  1 mg Intravenous Daily  . imipenem-cilastatin (PRIMAXIN) IVPB  500 mg Intravenous Q8H  . phytonadione  10 mg Oral Daily  . potassium chloride  40 mEq Oral 5 X Daily  . sulfamethoxazole-trimethoprim  1 tablet Oral Daily  . thiamine  100 mg Intravenous Daily   Continuous Infusions: . 0.9 % NaCl with KCl 40 mEq / L 125 mL/hr (10/10/14 2210)    Time spent: 35 minutes  Demetrica Zipp L  [facility] Pager [phone]. If 7PM-7AM, please contact night-coverage at [email or web address], password [email or web address] 10/11/2014, 12:26 PM  LOS: 5 days

## 2014-10-11 NOTE — Progress Notes (Signed)
MD Lendell CapriceSullivan notified face to face, telemetry notified me patient had an arrhythmia, heart rate increased to 110's and dropped back down to 50's.

## 2014-10-11 NOTE — Anesthesia Procedure Notes (Signed)
Procedure Name: Intubation Date/Time: 10/11/2014 8:36 AM Performed by: Reine JustFLOWERS, Dwayne Bulkley T Pre-anesthesia Checklist: Patient identified, Emergency Drugs available, Suction available, Patient being monitored and Timeout performed Patient Re-evaluated:Patient Re-evaluated prior to inductionOxygen Delivery Method: Circle system utilized and Simple face mask Preoxygenation: Pre-oxygenation with 100% oxygen Intubation Type: IV induction Ventilation: Mask ventilation without difficulty Laryngoscope Size: Miller and 3 Grade View: Grade I Tube type: Oral Tube size: 7.5 mm Number of attempts: 1 Airway Equipment and Method: Patient positioned with wedge pillow and Stylet Placement Confirmation: ETT inserted through vocal cords under direct vision,  positive ETCO2 and breath sounds checked- equal and bilateral Secured at: 23 cm Tube secured with: Tape Dental Injury: Teeth and Oropharynx as per pre-operative assessment

## 2014-10-11 NOTE — Anesthesia Preprocedure Evaluation (Addendum)
Anesthesia Evaluation  Patient identified by MRN, date of birth, ID band Patient awake    Reviewed: Allergy & Precautions, NPO status , Patient's Chart, lab work & pertinent test results  History of Anesthesia Complications Negative for: history of anesthetic complications  Airway Mallampati: I  TM Distance: >3 FB Neck ROM: Full    Dental  (+) Teeth Intact, Dental Advisory Given   Pulmonary  breath sounds clear to auscultation        Cardiovascular negative cardio ROS  Rhythm:Regular     Neuro/Psych negative neurological ROS     GI/Hepatic negative GI ROS,   Endo/Other    Renal/GU ARFRenal disease     Musculoskeletal   Abdominal   Peds  Hematology  (+) anemia , HIV,   Anesthesia Other Findings   Reproductive/Obstetrics                            Anesthesia Physical Anesthesia Plan  ASA: III  Anesthesia Plan: General   Post-op Pain Management:    Induction: Intravenous  Airway Management Planned: Oral ETT  Additional Equipment: None  Intra-op Plan:   Post-operative Plan: Extubation in OR  Informed Consent: I have reviewed the patients History and Physical, chart, labs and discussed the procedure including the risks, benefits and alternatives for the proposed anesthesia with the patient or authorized representative who has indicated his/her understanding and acceptance.   Dental advisory given  Plan Discussed with: CRNA and Surgeon  Anesthesia Plan Comments:        Anesthesia Quick Evaluation

## 2014-10-11 NOTE — Op Note (Signed)
Laparoscopic Cholecystectomy with IOC Procedure Note  Indications: This patient presents with symptomatic gallbladder disease and will undergo laparoscopic cholecystectomy. He had cholecystitis and CBD stones which were removed by preop ERCP.  Pre-operative Diagnosis: Calculus of bile duct with acute cholecystitis and obstruction  Post-operative Diagnosis: Calculus of gallbladder with acute cholecystitis, without mention of obstruction  Surgeon: Abigail MiyamotoBLACKMAN,Dijon Kohlman A   Assistants: 0  Anesthesia: General endotracheal anesthesia  ASA Class: 3  Procedure Details  The patient was seen again in the Holding Room. The risks, benefits, complications, treatment options, and expected outcomes were discussed with the patient. The possibilities of reaction to medication, pulmonary aspiration, perforation of viscus, bleeding, recurrent infection, finding a normal gallbladder, the need for additional procedures, failure to diagnose a condition, the possible need to convert to an open procedure, and creating a complication requiring transfusion or operation were discussed with the patient. The likelihood of improving the patient's symptoms with return to their baseline status is good.  The patient and/or family concurred with the proposed plan, giving informed consent. The site of surgery properly noted. The patient was taken to Operating Room, [facility] as [facility] and the procedure verified as Laparoscopic Cholecystectomy with Intraoperative Cholangiogram. A Time Out was held and the above information confirmed.  Prior to the induction of general anesthesia, antibiotic prophylaxis was administered. General endotracheal anesthesia was then administered and tolerated well. After the induction, the abdomen was prepped with Chloraprep and draped in the sterile fashion. The patient was positioned in the supine position.  Local anesthetic agent was injected into the skin near the umbilicus and an incision  made. We dissected down to the abdominal fascia with blunt dissection.  The fascia was incised vertically and we entered the peritoneal cavity bluntly.  A pursestring suture of 0-Vicryl was placed around the fascial opening.  The Hasson cannula was inserted and secured with the stay suture.  Pneumoperitoneum was then created with CO2 and tolerated well without any adverse changes in the patient's vital signs. A 5-mm port was placed in the subxiphoid position.  Two 5-mm ports were placed in the right upper quadrant. All skin incisions were infiltrated with a local anesthetic agent before making the incision and placing the trocars.   We positioned the patient in reverse Trendelenburg, tilted slightly to the patient's left.  The gallbladder was identified, the fundus grasped and retracted cephalad. Adhesions were lysed bluntly and with the electrocautery where indicated, taking care not to injure any adjacent organs or viscus. The infundibulum was grasped and retracted laterally, exposing the peritoneum overlying the triangle of Calot. This was then divided and exposed in a blunt fashion. A critical view of the cystic duct and cystic artery was obtained.  The cystic duct was clearly identified and bluntly dissected circumferentially. The cystic duct was ligated with a clip distally.   An incision was made in the cystic duct and the [facility] introduced. The catheter was secured using a clip. A cholangiogram was then obtained which showed good visualization of the distal and proximal biliary tree with no sign of filling defects or obstruction.  Contrast flowed easily into the duodenum. The catheter was then removed.   The cystic duct was then ligated with clips and divided. The cystic artery was identified, dissected free, ligated with clips and divided as well.   The gallbladder was dissected from the liver bed in retrograde fashion with the electrocautery. The gallbladder was removed and  placed in an Endocatch sac. The liver bed was  irrigated and inspected. Hemostasis was achieved with the electrocautery. Copious irrigation was utilized and was repeatedly aspirated until clear.  The gallbladder and Endocatch sac were then removed through the umbilical port site.  The pursestring suture was used to close the umbilical fascia.    We again inspected the right upper quadrant for hemostasis.  Pneumoperitoneum was released as we removed the trocars.  4-0 Monocryl was used to close the skin.   Skin glue was  applied. The patient was then extubated and brought to the recovery room in stable condition. Instrument, sponge, and needle counts were correct at closure and at the conclusion of the case.   Findings: Cholecystitis with Cholelithiasis c-gram showed CBD cleared s/p ERCP  Estimated Blood Loss: less than 50 mL         Drains: 0         Specimens: Gallbladder           Complications: None; patient tolerated the procedure well.         Disposition: PACU - hemodynamically stable.         Condition: stable

## 2014-10-11 NOTE — Progress Notes (Addendum)
INFECTIOUS DISEASE PROGRESS NOTE  ID: Jon Love is a 39 y.o. male with  Principal Problem:   Choledocholithiasis Active Problems:   Hypokalemia   Acute renal failure   Abnormal transaminases   Metabolic acidosis   Diarrhea   Hyponatremia   Cholelithiasis   Protein-calorie malnutrition, severe   Intrahepatic bile duct stones   Hypotension   AIDS (acquired immunodeficiency syndrome), CD4 <=200   Sepsis  Subjective: Wants to eat.  Mild itching.    Abtx:  Anti-infectives    Start     Dose/Rate Route Frequency Ordered Stop   10/10/14 1400  azithromycin (ZITHROMAX) tablet 1,200 mg     1,200 mg Oral Weekly 10/10/14 1323     10/10/14 1400  sulfamethoxazole-trimethoprim (BACTRIM,SEPTRA) 400-80 MG per tablet 1 tablet     1 tablet Oral Daily 10/10/14 1323     10/08/14 1300  imipenem-cilastatin (PRIMAXIN) 500 mg in sodium chloride 0.9 % 100 mL IVPB     500 mg 200 mL/hr over 30 Minutes Intravenous Every 8 hours 10/08/14 1250     10/07/14 0400  ciprofloxacin (CIPRO) IVPB 400 mg  Status:  Discontinued     400 mg 200 mL/hr over 60 Minutes Intravenous Every 12 hours 10/06/14 1700 10/08/14 1237   10/06/14 1630  metroNIDAZOLE (FLAGYL) IVPB 500 mg  Status:  Discontinued     500 mg 100 mL/hr over 60 Minutes Intravenous Every 8 hours 10/06/14 1625 10/08/14 1237   10/06/14 1545  ciprofloxacin (CIPRO) IVPB 400 mg     400 mg 200 mL/hr over 60 Minutes Intravenous  Once 10/06/14 1533 10/06/14 1652   10/06/14 1545  metroNIDAZOLE (FLAGYL) IVPB 500 mg  Status:  Discontinued     500 mg 100 mL/hr over 60 Minutes Intravenous  Once 10/06/14 1533 10/06/14 1629      Medications:  Scheduled: . azithromycin  1,200 mg Oral Weekly  . feeding supplement (RESOURCE BREEZE)  1 Container Oral TID BM  . folic acid  1 mg Intravenous Daily  . imipenem-cilastatin (PRIMAXIN) IVPB  500 mg Intravenous Q8H  . phytonadione  10 mg Oral Daily  . potassium chloride  40 mEq Oral TID  .  sulfamethoxazole-trimethoprim  1 tablet Oral Daily  . thiamine  100 mg Intravenous Daily    Objective: Vital signs in last 24 hours: Temp:  [98.1 F (36.7 C)-98.6 F (37 C)] 98.1 F (36.7 C) (05/04 1048) Pulse Rate:  [53-73] 54 (05/04 1048) Resp:  [10-20] 16 (05/04 1048) BP: (85-106)/(55-86) 104/66 mmHg (05/04 1048) SpO2:  [89 %-100 %] 89 % (05/04 1048)   General appearance: alert, cooperative and no distress Resp: clear to auscultation bilaterally Cardio: regular rate and rhythm GI: normal findings: bowel sounds normal  Lab Results  Recent Labs  10/09/14 0300  10/10/14 1100 10/11/14 0600  WBC 3.4*  --   --   --   HGB 10.0*  --   --   --   HCT 29.0*  --   --   --   NA 141  < > 140 140  K 2.8*  < > 2.2* 2.8*  CL 118*  < > 109 104  CO2 14*  < > 22 28  BUN 6  < > <5* <5*  CREATININE 1.04  < > 0.92 0.73  < > = values in this interval not displayed. Liver Panel  Recent Labs  10/09/14 0300  PROT 4.4*  ALBUMIN 2.1*  AST 32  ALT 27  ALKPHOS 63  BILITOT  1.0   Sedimentation Rate No results for input(s): ESRSEDRATE in the last 72 hours. C-Reactive Protein No results for input(s): CRP in the last 72 hours.  Microbiology: Recent Results (from the past 240 hour(s))  Culture, blood (routine x 2)     Status: None (Preliminary result)   Collection Time: 10/06/14  3:39 PM  Result Value Ref Range Status   Specimen Description BLOOD RIGHT WRIST  Final   Special Requests BOTTLES DRAWN AEROBIC AND ANAEROBIC 5CC  Final   Culture   Final           BLOOD CULTURE RECEIVED NO GROWTH TO DATE CULTURE WILL BE HELD FOR 5 DAYS BEFORE ISSUING A FINAL NEGATIVE REPORT Performed at [facility]    Report Status PENDING  Incomplete  Culture, blood (routine x 2)     Status: None (Preliminary result)   Collection Time: 10/06/14  3:43 PM  Result Value Ref Range Status   Specimen Description BLOOD RIGHT ARM  Final   Special Requests BOTTLES DRAWN AEROBIC AND ANAEROBIC 5CC  Final    Culture   Final           BLOOD CULTURE RECEIVED NO GROWTH TO DATE CULTURE WILL BE HELD FOR 5 DAYS BEFORE ISSUING A FINAL NEGATIVE REPORT Performed at [facility]    Report Status PENDING  Incomplete  MRSA PCR Screening     Status: None   Collection Time: 10/06/14  6:58 PM  Result Value Ref Range Status   MRSA by PCR NEGATIVE NEGATIVE Final    Comment:        The GeneXpert MRSA Assay (FDA approved for NASAL specimens only), is one component of a comprehensive MRSA colonization surveillance program. It is not intended to diagnose MRSA infection nor to guide or monitor treatment for MRSA infections.   Clostridium Difficile by PCR     Status: None   Collection Time: 10/08/14  9:00 AM  Result Value Ref Range Status   C difficile by pcr NEGATIVE NEGATIVE Final  Culture, blood (x 2)     Status: None (Preliminary result)   Collection Time: 10/08/14  1:55 PM  Result Value Ref Range Status   Specimen Description BLOOD LEFT HAND  Final   Special Requests BOTTLES DRAWN AEROBIC ONLY  9CC  Final   Culture   Final           BLOOD CULTURE RECEIVED NO GROWTH TO DATE CULTURE WILL BE HELD FOR 5 DAYS BEFORE ISSUING A FINAL NEGATIVE REPORT Performed at [facility]    Report Status PENDING  Incomplete  Culture, blood (x 2)     Status: None (Preliminary result)   Collection Time: 10/08/14  2:03 PM  Result Value Ref Range Status   Specimen Description BLOOD RIGHT HAND  Final   Special Requests BOTTLES DRAWN AEROBIC ONLY  3CC  Final   Culture   Final           BLOOD CULTURE RECEIVED NO GROWTH TO DATE CULTURE WILL BE HELD FOR 5 DAYS BEFORE ISSUING A FINAL NEGATIVE REPORT Performed at [facility]    Report Status PENDING  Incomplete    Studies/Results: Dg Chest Port 1 View  10/10/2014   CLINICAL DATA:  Cough history of sepsis, AIDS, and acute renal failure.  EXAM: PORTABLE CHEST - 1 VIEW  COMPARISON:  None.  FINDINGS: The lungs are adequately inflated. There are  coarse near confluent lung markings in the left lower lobe. There are  minimal similar appearing increased interstitial markings at the right lung base. The heart and pulmonary vascularity are normal. The mediastinum is normal in width. The bony thorax is unremarkable.  IMPRESSION: Interstitial and/or early alveolar pneumonia in the left lower lobe. A follow-up PA and lateral chest x-ray would be useful.   Electronically Signed   By: David  [place]az[place] M.D.   On: 10/10/2014 13:34   Dg Ercp Biliary & Pancreatic Ducts  10/09/2014   CLINICAL DATA:  Cholelithiasis  EXAM: ERCP  TECHNIQUE: Multiple spot images obtained with the fluoroscopic device and submitted for interpretation post-procedure.  FLUOROSCOPY TIME:  3 minutes 38 seconds  COMPARISON:  MR 10/07/2014  FINDINGS: Four fluoroscopic spot images document endoscopic cannulation and opacification of the central biliary tree. Possible filling defects in the mid CBD on the initial image. Subsequent Images document passage of a balloon catheter through the CBD. The intrahepatic bile ducts are incompletely opacified, appearing decompressed centrally. Some contrast flows through the patent cystic duct into the gallbladder. No extravasation identified.  IMPRESSION: 1. ERCP with balloon sweep of the common duct. These images were submitted for radiologic interpretation only. Please see the procedural report for the amount of contrast and the fluoroscopy time utilized.   Electronically Signed   By: Corlis Leak M.D.   On: 10/09/2014 14:12     Assessment/Plan: Acute cholecystitis AIDS Hypokalemia Protein calorie malnutrition  Total days of antibiotics: 7 (imipenem)   Needs emergency adap Watch itching to see if worsens (? From bactrim). O/w no problems with anbx.  Await GI pathogen panel   CD4 T CELL ABS (/uL)  Date Value  10/08/2014 10*         Johny Sax Infectious Diseases (pager) [phone] [email or web address] 10/11/2014, 11:42 AM  LOS: 5 days

## 2014-10-12 ENCOUNTER — Inpatient Hospital Stay (HOSPITAL_COMMUNITY): Payer: Self-pay

## 2014-10-12 DIAGNOSIS — M1000 Idiopathic gout, unspecified site: Secondary | ICD-10-CM

## 2014-10-12 DIAGNOSIS — M109 Gout, unspecified: Secondary | ICD-10-CM | POA: Diagnosis not present

## 2014-10-12 DIAGNOSIS — Z9049 Acquired absence of other specified parts of digestive tract: Secondary | ICD-10-CM

## 2014-10-12 LAB — BASIC METABOLIC PANEL
Anion gap: 7 (ref 5–15)
CHLORIDE: 98 mmol/L — AB (ref 101–111)
CO2: 33 mmol/L — AB (ref 22–32)
CREATININE: 0.73 mg/dL (ref 0.61–1.24)
Calcium: 6.4 mg/dL — CL (ref 8.9–10.3)
GFR calc Af Amer: >60 mL/min (ref >60)
GFR calc non Af Amer: >60 mL/min (ref >60)
Glucose, Bld: 76 mg/dL (ref 70–99)
Potassium: 3.7 mmol/L (ref 3.5–5.1)
Sodium: 138 mmol/L (ref 135–145)

## 2014-10-12 LAB — CULTURE, BLOOD (ROUTINE X 2)
CULTURE: NO GROWTH
Culture: NO GROWTH

## 2014-10-12 LAB — HLA B*5701: HLA B 5701: NEGATIVE

## 2014-10-12 LAB — MAGNESIUM: MAGNESIUM: 0.9 mg/dL — AB (ref 1.7–2.4)

## 2014-10-12 MED ORDER — COLCHICINE 0.6 MG PO TABS
1.2000 mg | ORAL_TABLET | Freq: Once | ORAL | Status: AC
  Administered 2014-10-12: 1.2 mg via ORAL
  Filled 2014-10-12: qty 2

## 2014-10-12 MED ORDER — PNEUMOCOCCAL 13-VAL CONJ VACC IM SUSP
0.5000 mL | INTRAMUSCULAR | Status: DC
  Filled 2014-10-12 (×2): qty 0.5

## 2014-10-12 MED ORDER — KETOROLAC TROMETHAMINE 30 MG/ML IJ SOLN: 30.0000 mg | Freq: Once | INTRAMUSCULAR | Status: DC

## 2014-10-12 MED ORDER — MAGNESIUM SULFATE 4 GM/100ML IV SOLN
4.0000 g | Freq: Once | INTRAVENOUS | Status: AC
  Administered 2014-10-12: 4 g via INTRAVENOUS
  Filled 2014-10-12: qty 100

## 2014-10-12 MED ORDER — FOLIC ACID 1 MG PO TABS
1.0000 mg | ORAL_TABLET | Freq: Every day | ORAL | Status: DC
  Administered 2014-10-12 – 2014-10-13 (×2): 1 mg via ORAL
  Filled 2014-10-12 (×2): qty 1

## 2014-10-12 MED ORDER — VITAMIN B-1 100 MG PO TABS
100.0000 mg | ORAL_TABLET | Freq: Every day | ORAL | Status: DC
  Administered 2014-10-12 – 2014-10-13 (×2): 100 mg via ORAL
  Filled 2014-10-12 (×2): qty 1

## 2014-10-12 MED ORDER — POTASSIUM CHLORIDE CRYS ER 20 MEQ PO TBCR
40.0000 meq | EXTENDED_RELEASE_TABLET | Freq: Two times a day (BID) | ORAL | Status: DC
  Administered 2014-10-12: 40 meq via ORAL
  Filled 2014-10-12 (×3): qty 2

## 2014-10-12 NOTE — Anesthesia Postprocedure Evaluation (Signed)
  Anesthesia Post-op Note  Patient: Jon Love  Procedure(s) Performed: Procedure(s): LAPAROSCOPIC CHOLECYSTECTOMY WITH INTRAOPERATIVE CHOLANGIOGRAM (N/A)  Patient Location: PACU  Anesthesia Type:General  Level of Consciousness: awake  Airway and Oxygen Therapy: Patient Spontanous Breathing  Post-op Pain: mild  Post-op Assessment: Post-op Vital signs reviewed, Patient's Cardiovascular Status Stable, Respiratory Function Stable, Patent Airway, No signs of Nausea or vomiting and Pain level controlled  Post-op Vital Signs: Reviewed and stable  Last Vitals:  Filed Vitals:   10/12/14 1421  BP: 112/54  Pulse:   Temp:   Resp:     Complications: No apparent anesthesia complications

## 2014-10-12 NOTE — Progress Notes (Signed)
Pt's arm is still swollen, red, and hot to the touch. Pt wanted to remove the heating pad at this time because it was making his arm too hot. Notified MD that pt's arm has not changed and no open lesions are noted to that arm. IV was removed and rotated to the left arm. MD advised that we will get an X-ray of the pt's right arm at this time. Pt is keeping his right arm elevated on a pillow and is alternating with the K Pad and an ice pack. Will continue to monitor and wait for test results.

## 2014-10-12 NOTE — Progress Notes (Signed)
[facility] PROGRESS NOTE  Jon Love ONG:[id number] DOB: [birth year] DOA: 10/06/2014 PCP: No PCP Per Patient   summary: 39 yo Spanish speaking male presents with diarrhea and abdominal pain. Found to have cholelithiasis, biliary ductal dilatation concerning for choledocholithiasis. Charted on empiric Cipro and Flagyl. Surgery and GI consultation in the emergency room. Stool studies ordered, MRCP shows common duct stone Without evidence of cholecystitis.  Transferred to ICU for refractory shock 5/1. Had ERCP, sphincterotomy. [facility]. Lap chole 5/4  Assessment/Plan: Choledocholithiasis/cholelithiasis: s/p ERCP sphincterotomy.  Status post laparoscopic cholecystectomy. improved Active Problems:  hypotension: resolved Acute gout right hand, most likely. Colchicine x1, toradol x1   Hypokalemia improved Cough: improved Hypomagnesemia: Recheck tomorrow. AIDS: CD4 abs 10. on MAC and PCP prophylaxis.     Acute renal failure:  resolved   Metabolic acidosis resolved   Diarrhea: resolved. W/u neg   Hyponatremia, hypovolemic, resolved with IV hydration   Code Status:  full Family Communication:   discussed with patient via interpreter found. No family present. Patient is alert and oriented.  Disposition Plan:  Home tomorrow if hand better  Consultants:  GI: Jon Love  Gen. Surgery  ID  Procedures:   ERCP sphincterotomy  Antibiotics: Cipro, Flagyl 4/29 -- 5/1 primaxin 5/1 - 5/4  HPI/Subjective: Right hand started hurting last night. Swollen. No n/v/d tol solids. Cough improved  Objective: Filed Vitals:   10/12/14 0526  BP: 103/61  Pulse:   Temp: 98.7 F (37.1 C)  Resp: 18    Intake/Output Summary (Last 24 hours) at 10/12/14 0942 Last data filed at 10/12/14 0523  Gross per 24 hour  Intake      0 ml  Output   2925 ml  Net  -2925 ml   Filed Weights   10/07/14 2102 10/08/14 1500 10/09/14 1824  Weight: 57.788 kg (127 lb 6.4 oz) 57.7 kg (127  lb 3.3 oz) 62.4 kg (137 lb 9.1 oz)    Exam:   General:  A and o. Pleasant cooperative.   Cardiovascular: Regular rate rhythm without murmurs gallops rubs  Respiratory: Clear to auscultation bilaterally without wheezes rhonchi or rales  Abdomen: Soft Appropriately tender postoperatively nondistended. Incisions without drainage   Ext: right hand with erythema edema and [facility]  Basic Metabolic Panel:  Recent Labs Lab 10/07/14 1640  10/09/14 0300 10/09/14 0936 10/10/14 1100 10/11/14 0600 10/12/14 0616  NA  --   < > 141 142 140 140 138  K  --   < > 2.8* 2.8* 2.2* 2.8* 3.7  CL  --   < > 118* 116* 109 104 98*  CO2  --   < > 14* 17* 22 28 33*  GLUCOSE  --   < > 80 87 132* 90 76  BUN  --   < > 6 5* <5* <5* <5*  CREATININE  --   < > 1.04 0.96 0.92 0.73 0.73  CALCIUM  --   < > 7.6* 7.5* 6.5* 6.7* 6.4*  MG 1.8  --  1.3*  --   --   --  0.9*  < > = values in this interval not displayed. Liver Function Tests:  Recent Labs Lab 10/06/14 1159 10/07/14 0302 10/09/14 0300  AST 66* 47* 32  ALT 50 35 27  ALKPHOS 134* 89 63  BILITOT 1.8* 1.0 1.0  PROT 8.1 5.5* 4.4*  ALBUMIN 3.7 2.5* 2.1*    Recent Labs Lab 10/06/14 1159  LIPASE 36   No results for  input(s): AMMONIA in the last 168 hours. CBC:  Recent Labs Lab 10/06/14 1159 10/07/14 0302 10/09/14 0300  WBC 6.4 5.0 3.4*  NEUTROABS 4.2  --  1.6*  HGB 15.5 11.3* 10.0*  HCT 43.4 31.6* 29.0*  MCV 73.1* 72.1* 74.0*  PLT 302 192 144*   Cardiac Enzymes: No results for input(s): CKTOTAL, CKMB, CKMBINDEX, TROPONINI in the last 168 hours. BNP (last 3 results) No results for input(s): BNP in the last 8760 hours.  ProBNP (last 3 results) No results for input(s): PROBNP in the last 8760 hours.  CBG: No results for input(s): GLUCAP in the last 168 hours.  Recent Results (from the past 240 hour(s))  Culture, blood (routine x 2)     Status: None   Collection Time: 10/06/14  3:39 PM  Result Value Ref Range  Status   Specimen Description BLOOD RIGHT WRIST  Final   Special Requests BOTTLES DRAWN AEROBIC AND ANAEROBIC 5CC  Final   Culture   Final    NO GROWTH 5 DAYS Performed at [facility]    Report Status 10/12/2014 FINAL  Final  Culture, blood (routine x 2)     Status: None   Collection Time: 10/06/14  3:43 PM  Result Value Ref Range Status   Specimen Description BLOOD RIGHT ARM  Final   Special Requests BOTTLES DRAWN AEROBIC AND ANAEROBIC 5CC  Final   Culture   Final    NO GROWTH 5 DAYS Performed at [facility]    Report Status 10/12/2014 FINAL  Final  MRSA PCR Screening     Status: None   Collection Time: 10/06/14  6:58 PM  Result Value Ref Range Status   MRSA by PCR NEGATIVE NEGATIVE Final    Comment:        The GeneXpert MRSA Assay (FDA approved for NASAL specimens only), is one component of a comprehensive MRSA colonization surveillance program. It is not intended to diagnose MRSA infection nor to guide or monitor treatment for MRSA infections.   Clostridium Difficile by PCR     Status: None   Collection Time: 10/08/14  9:00 AM  Result Value Ref Range Status   C difficile by pcr NEGATIVE NEGATIVE Final  Culture, blood (x 2)     Status: None (Preliminary result)   Collection Time: 10/08/14  1:55 PM  Result Value Ref Range Status   Specimen Description BLOOD LEFT HAND  Final   Special Requests BOTTLES DRAWN AEROBIC ONLY  9CC  Final   Culture   Final           BLOOD CULTURE RECEIVED NO GROWTH TO DATE CULTURE WILL BE HELD FOR 5 DAYS BEFORE ISSUING A FINAL NEGATIVE REPORT Performed at [facility]    Report Status PENDING  Incomplete  Culture, blood (x 2)     Status: None (Preliminary result)   Collection Time: 10/08/14  2:03 PM  Result Value Ref Range Status   Specimen Description BLOOD RIGHT HAND  Final   Special Requests BOTTLES DRAWN AEROBIC ONLY  3CC  Final   Culture   Final           BLOOD CULTURE RECEIVED NO GROWTH TO DATE CULTURE  WILL BE HELD FOR 5 DAYS BEFORE ISSUING A FINAL NEGATIVE REPORT Performed at [facility]    Report Status PENDING  Incomplete  Surgical pcr screen     Status: None   Collection Time: 10/11/14 10:15 AM  Result Value Ref Range Status  MRSA, PCR NEGATIVE NEGATIVE Final   Staphylococcus aureus NEGATIVE NEGATIVE Final    Comment:        The Xpert SA Assay (FDA approved for NASAL specimens in patients over 39 years of age), is one component of a comprehensive surveillance program.  Test performance has been validated by [facility] for patients greater than or equal to 39 year old. It is not intended to diagnose infection nor to guide or monitor treatment.      Studies: Dg Cholangiogram Operative  10/11/2014   CLINICAL DATA:  Cholecystectomy for cholelithiasis and acute cholecystitis. Status post endoscopic removal of common bile duct calculi on 10/09/2014.  EXAM: INTRAOPERATIVE CHOLANGIOGRAM  TECHNIQUE: Cholangiographic images from the C-arm fluoroscopic device were submitted for interpretation post-operatively. Please see the procedural report for the amount of contrast and the fluoroscopy time utilized.  COMPARISON:  Images from ERCP on 10/09/2014  FINDINGS: Single image from an intraoperative cholangiogram demonstrates mildly irregular contour or of the proper hepatic and common bile ducts without filling defects. The single image submitted does not show contrast in the duodenum. Visualized intrahepatic bile ducts are unremarkable. No contrast extravasation is seen.  IMPRESSION: No biliary filling defects identified. Contrast is not visualized in the duodenum on the spot image submitted.   Electronically Signed   By: Irish LackGlenn  Yamagata M.D.   On: 10/11/2014 12:56   Dg Hand 2 View Right  10/12/2014   CLINICAL DATA:  39 year old male with pain swelling and erythema of the right hand with no known injury. Initial encounter.  EXAM: RIGHT HAND - 2 VIEW  COMPARISON:  None.  FINDINGS: Two  views of the right hand. Bone mineralization is within normal limits. Distal radius and ulna appear intact. Carpal bone alignment within normal limits. Healed deformity of the fifth metacarpal AH moped possibly also of the third metacarpal. MCP and IP joint spaces and alignment are within normal limits. No acute osseous abnormality identified. No radiopaque foreign body identified. No subcutaneous gas identified.  IMPRESSION: No acute osseous abnormality identified in the right hand.   Electronically Signed   By: Odessa FlemingH  Hall M.D.   On: 10/12/2014 07:46   Dg Chest Port 1 View  10/10/2014   CLINICAL DATA:  Cough history of sepsis, AIDS, and acute renal failure.  EXAM: PORTABLE CHEST - 1 VIEW  COMPARISON:  None.  FINDINGS: The lungs are adequately inflated. There are coarse near confluent lung markings in the left lower lobe. There are minimal similar appearing increased interstitial markings at the right lung base. The heart and pulmonary vascularity are normal. The mediastinum is normal in width. The bony thorax is unremarkable.  IMPRESSION: Interstitial and/or early alveolar pneumonia in the left lower lobe. A follow-up PA and lateral chest x-ray would be useful.   Electronically Signed   By: David  Swazil[place]Jordan M.D.   On: 10/10/2014 13:34    Scheduled Meds: . azithromycin  1,200 mg Oral Weekly  . feeding supplement (RESOURCE BREEZE)  1 Container Oral TID BM  . folic acid  1 mg Oral Daily  . phytonadione  10 mg Oral Daily  . potassium chloride  40 mEq Oral 5 X Daily  . sulfamethoxazole-trimethoprim  1 tablet Oral Daily  . thiamine  100 mg Oral Daily   Continuous Infusions: . 0.9 % NaCl with KCl 40 mEq / L 75 mL/hr (10/12/14 0019)    Time spent: 35 minutes  Garima Chronis L  [facility] Pager [phone]. If 7PM-7AM, please contact night-coverage at [email or web address], password  [email or web address] 10/12/2014, 9:42 AM  LOS: 6 days

## 2014-10-12 NOTE — Progress Notes (Addendum)
Spanish interpretor ID # [id number] use to do morning assessment. Patient complains of R hand pain and rates it at an 8/10. PRN pain med given. RN also explained that patient got an xray of hand done this AM and that the MD will discuss results.

## 2014-10-12 NOTE — Progress Notes (Signed)
Patient ID: Jon Love, male   DOB: November 29, 1975, 39 y.o.   MRN: [id number] 1 Day Post-Op  Subjective: Pt looks great.  Feels well.  Minimal pain.  Tolerating his regular diet with no issues.  C/o right hand swelling and pain  Objective: Vital signs in last 24 hours: Temp:  [97.6 F (36.4 C)-98.8 F (37.1 C)] 98.7 F (37.1 C) (05/05 0526) Pulse Rate:  [63-75] 75 (05/04 2222) Resp:  [16-18] 18 (05/05 0526) BP: (94-117)/(58-85) 103/61 mmHg (05/05 0526) SpO2:  [88 %-100 %] 100 % (05/05 0526) Last BM Date: 10/10/14  Intake/Output from previous day: 05/04 0701 - 05/05 0700 In: 600 [I.V.:600] Out: 3325 [Urine:3325] Intake/Output this shift: Total I/O In: 120 [P.O.:120] Out: 200 [Urine:200]  PE: Abd: soft, minimally tender, incisions c/d/i, +BS, ND Ext: right hand with edema and erythema of his 2 and 3 MCP joints  Lab Results:  No results for input(s): WBC, HGB, HCT, PLT in the last 72 hours. BMET  Recent Labs  10/11/14 0600 10/12/14 0616  NA 140 138  K 2.8* 3.7  CL 104 98*  CO2 28 33*  GLUCOSE 90 76  BUN <5* <5*  CREATININE 0.73 0.73  CALCIUM 6.7* 6.4*   PT/INR  Recent Labs  10/11/14 0600  LABPROT 17.9*  INR 1.46   CMP     Component Value Date/Time   NA 138 10/12/2014 0616   K 3.7 10/12/2014 0616   CL 98* 10/12/2014 0616   CO2 33* 10/12/2014 0616   GLUCOSE 76 10/12/2014 0616   BUN <5* 10/12/2014 0616   CREATININE 0.73 10/12/2014 0616   CALCIUM 6.4* 10/12/2014 0616   PROT 4.4* 10/09/2014 0300   ALBUMIN 2.1* 10/09/2014 0300   AST 32 10/09/2014 0300   ALT 27 10/09/2014 0300   ALKPHOS 63 10/09/2014 0300   BILITOT 1.0 10/09/2014 0300   GFRNONAA >60 10/12/2014 0616   GFRAA >60 10/12/2014 0616   Lipase     Component Value Date/Time   LIPASE 36 10/06/2014 1159       Studies/Results: Dg Cholangiogram Operative  10/11/2014   CLINICAL DATA:  Cholecystectomy for cholelithiasis and acute cholecystitis. Status post endoscopic removal of common bile  duct calculi on 10/09/2014.  EXAM: INTRAOPERATIVE CHOLANGIOGRAM  TECHNIQUE: Cholangiographic images from the C-arm fluoroscopic device were submitted for interpretation post-operatively. Please see the procedural report for the amount of contrast and the fluoroscopy time utilized.  COMPARISON:  Images from ERCP on 10/09/2014  FINDINGS: Single image from an intraoperative cholangiogram demonstrates mildly irregular contour or of the proper hepatic and common bile ducts without filling defects. The single image submitted does not show contrast in the duodenum. Visualized intrahepatic bile ducts are unremarkable. No contrast extravasation is seen.  IMPRESSION: No biliary filling defects identified. Contrast is not visualized in the duodenum on the spot image submitted.   Electronically Signed   By: Irish LackGlenn  Yamagata M.D.   On: 10/11/2014 12:56   Dg Hand 2 View Right  10/12/2014   CLINICAL DATA:  39 year old male with pain swelling and erythema of the right hand with no known injury. Initial encounter.  EXAM: RIGHT HAND - 2 VIEW  COMPARISON:  None.  FINDINGS: Two views of the right hand. Bone mineralization is within normal limits. Distal radius and ulna appear intact. Carpal bone alignment within normal limits. Healed deformity of the fifth metacarpal AH moped possibly also of the third metacarpal. MCP and IP joint spaces and alignment are within normal limits. No acute osseous abnormality identified.  No radiopaque foreign body identified. No subcutaneous gas identified.  IMPRESSION: No acute osseous abnormality identified in the right hand.   Electronically Signed   By: Odessa FlemingH  Hall M.D.   On: 10/12/2014 07:46   Dg Chest Port 1 View  10/10/2014   CLINICAL DATA:  Cough history of sepsis, AIDS, and acute renal failure.  EXAM: PORTABLE CHEST - 1 VIEW  COMPARISON:  None.  FINDINGS: The lungs are adequately inflated. There are coarse near confluent lung markings in the left lower lobe. There are minimal similar appearing  increased interstitial markings at the right lung base. The heart and pulmonary vascularity are normal. The mediastinum is normal in width. The bony thorax is unremarkable.  IMPRESSION: Interstitial and/or early alveolar pneumonia in the left lower lobe. A follow-up PA and lateral chest x-ray would be useful.   Electronically Signed   By: David  Swazil[place]Jordan M.D.   On: 10/10/2014 13:34    Anti-infectives: Anti-infectives    Start     Dose/Rate Route Frequency Ordered Stop   10/10/14 1400  azithromycin (ZITHROMAX) tablet 1,200 mg     1,200 mg Oral Weekly 10/10/14 1323     10/10/14 1400  sulfamethoxazole-trimethoprim (BACTRIM,SEPTRA) 400-80 MG per tablet 1 tablet     1 tablet Oral Daily 10/10/14 1323     10/08/14 1300  imipenem-cilastatin (PRIMAXIN) 500 mg in sodium chloride 0.9 % 100 mL IVPB  Status:  Discontinued     500 mg 200 mL/hr over 30 Minutes Intravenous Every 8 hours 10/08/14 1250 10/11/14 1228   10/07/14 0400  ciprofloxacin (CIPRO) IVPB 400 mg  Status:  Discontinued     400 mg 200 mL/hr over 60 Minutes Intravenous Every 12 hours 10/06/14 1700 10/08/14 1237   10/06/14 1630  metroNIDAZOLE (FLAGYL) IVPB 500 mg  Status:  Discontinued     500 mg 100 mL/hr over 60 Minutes Intravenous Every 8 hours 10/06/14 1625 10/08/14 1237   10/06/14 1545  ciprofloxacin (CIPRO) IVPB 400 mg     400 mg 200 mL/hr over 60 Minutes Intravenous  Once 10/06/14 1533 10/06/14 1652   10/06/14 1545  metroNIDAZOLE (FLAGYL) IVPB 500 mg  Status:  Discontinued     500 mg 100 mL/hr over 60 Minutes Intravenous  Once 10/06/14 1533 10/06/14 1629       Assessment/Plan  POD 1, s/p lap chole with - IOC, s/p ERCP with stone extraction -patient doing well surgically.  He is stable from our standpoint for dc home.   -DC instructions have been provided to him in spanish -we will sign off as he will likely remain inpatient for another day or so for medical reasons.  His follow up has been arranged and is in epic. ? Gout of his  hand -defer to medicine.   LOS: 6 days    Mitchell Iwanicki E 10/12/2014, 10:53 AM Pager: [phone]

## 2014-10-12 NOTE — Progress Notes (Signed)
Central telemetry called to report that pt had a 5 beat run of V-Tach. I cannot find a history of this happening before. MD has been paged and will wait for additional orders. Will continue to monitor.

## 2014-10-12 NOTE — Progress Notes (Signed)
INFECTIOUS DISEASE PROGRESS NOTE  ID: Jon Love is a 39 y.o. male with  Principal Problem:   Choledocholithiasis Active Problems:   Hypokalemia   Acute renal failure   Abnormal transaminases   Metabolic acidosis   Diarrhea   Hyponatremia   Cholelithiasis   Protein-calorie malnutrition, severe   Intrahepatic bile duct stones   Hypotension   AIDS (acquired immunodeficiency syndrome), CD4 <=200   Sepsis   Cough   Acute gout  Subjective: Without complaints. Pain resolved.   Abtx:  Anti-infectives    Start     Dose/Rate Route Frequency Ordered Stop   10/10/14 1400  azithromycin (ZITHROMAX) tablet 1,200 mg     1,200 mg Oral Weekly 10/10/14 1323     10/10/14 1400  sulfamethoxazole-trimethoprim (BACTRIM,SEPTRA) 400-80 MG per tablet 1 tablet     1 tablet Oral Daily 10/10/14 1323     10/08/14 1300  imipenem-cilastatin (PRIMAXIN) 500 mg in sodium chloride 0.9 % 100 mL IVPB  Status:  Discontinued     500 mg 200 mL/hr over 30 Minutes Intravenous Every 8 hours 10/08/14 1250 10/11/14 1228   10/07/14 0400  ciprofloxacin (CIPRO) IVPB 400 mg  Status:  Discontinued     400 mg 200 mL/hr over 60 Minutes Intravenous Every 12 hours 10/06/14 1700 10/08/14 1237   10/06/14 1630  metroNIDAZOLE (FLAGYL) IVPB 500 mg  Status:  Discontinued     500 mg 100 mL/hr over 60 Minutes Intravenous Every 8 hours 10/06/14 1625 10/08/14 1237   10/06/14 1545  ciprofloxacin (CIPRO) IVPB 400 mg     400 mg 200 mL/hr over 60 Minutes Intravenous  Once 10/06/14 1533 10/06/14 1652   10/06/14 1545  metroNIDAZOLE (FLAGYL) IVPB 500 mg  Status:  Discontinued     500 mg 100 mL/hr over 60 Minutes Intravenous  Once 10/06/14 1533 10/06/14 1629      Medications:  Scheduled: . azithromycin  1,200 mg Oral Weekly  . feeding supplement (RESOURCE BREEZE)  1 Container Oral TID BM  . folic acid  1 mg Oral Daily  . ketorolac  30 mg Intravenous Once  . magnesium sulfate 1 - 4 g bolus IVPB  4 g Intravenous Once  .  potassium chloride  40 mEq Oral BID  . sulfamethoxazole-trimethoprim  1 tablet Oral Daily  . thiamine  100 mg Oral Daily    Objective: Vital signs in last 24 hours: Temp:  [97.6 F (36.4 C)-98.8 F (37.1 C)] 98.7 F (37.1 C) (05/05 0526) Pulse Rate:  [63-75] 75 (05/04 2222) Resp:  [16-18] 18 (05/05 0526) BP: (94-117)/(58-85) 103/61 mmHg (05/05 0526) SpO2:  [88 %-100 %] 100 % (05/05 0526)   General appearance: alert, cooperative and no distress Resp: clear to auscultation bilaterally Cardio: regular rate and rhythm GI: normal findings: bowel sounds normal and soft, non-tender and wounds clean and well healing.   Lab Results  Recent Labs  10/11/14 0600 10/12/14 0616  NA 140 138  K 2.8* 3.7  CL 104 98*  CO2 28 33*  BUN <5* <5*  CREATININE 0.73 0.73   Liver Panel No results for input(s): PROT, ALBUMIN, AST, ALT, ALKPHOS, BILITOT, BILIDIR, IBILI in the last 72 hours. Sedimentation Rate No results for input(s): ESRSEDRATE in the last 72 hours. C-Reactive Protein No results for input(s): CRP in the last 72 hours.  Microbiology: Recent Results (from the past 240 hour(s))  Culture, blood (routine x 2)     Status: None   Collection Time: 10/06/14  3:39 PM  Result Value Ref Range Status   Specimen Description BLOOD RIGHT WRIST  Final   Special Requests BOTTLES DRAWN AEROBIC AND ANAEROBIC 5CC  Final   Culture   Final    NO GROWTH 5 DAYS Performed at [facility]    Report Status 10/12/2014 FINAL  Final  Culture, blood (routine x 2)     Status: None   Collection Time: 10/06/14  3:43 PM  Result Value Ref Range Status   Specimen Description BLOOD RIGHT ARM  Final   Special Requests BOTTLES DRAWN AEROBIC AND ANAEROBIC 5CC  Final   Culture   Final    NO GROWTH 5 DAYS Performed at [facility]    Report Status 10/12/2014 FINAL  Final  MRSA PCR Screening     Status: None   Collection Time: 10/06/14  6:58 PM  Result Value Ref Range Status   MRSA by PCR  NEGATIVE NEGATIVE Final    Comment:        The GeneXpert MRSA Assay (FDA approved for NASAL specimens only), is one component of a comprehensive MRSA colonization surveillance program. It is not intended to diagnose MRSA infection nor to guide or monitor treatment for MRSA infections.   Clostridium Difficile by PCR     Status: None   Collection Time: 10/08/14  9:00 AM  Result Value Ref Range Status   C difficile by pcr NEGATIVE NEGATIVE Final  Culture, blood (x 2)     Status: None (Preliminary result)   Collection Time: 10/08/14  1:55 PM  Result Value Ref Range Status   Specimen Description BLOOD LEFT HAND  Final   Special Requests BOTTLES DRAWN AEROBIC ONLY  9CC  Final   Culture   Final           BLOOD CULTURE RECEIVED NO GROWTH TO DATE CULTURE WILL BE HELD FOR 5 DAYS BEFORE ISSUING A FINAL NEGATIVE REPORT Performed at [facility]    Report Status PENDING  Incomplete  Culture, blood (x 2)     Status: None (Preliminary result)   Collection Time: 10/08/14  2:03 PM  Result Value Ref Range Status   Specimen Description BLOOD RIGHT HAND  Final   Special Requests BOTTLES DRAWN AEROBIC ONLY  3CC  Final   Culture   Final           BLOOD CULTURE RECEIVED NO GROWTH TO DATE CULTURE WILL BE HELD FOR 5 DAYS BEFORE ISSUING A FINAL NEGATIVE REPORT Performed at [facility]    Report Status PENDING  Incomplete  Surgical pcr screen     Status: None   Collection Time: 10/11/14 10:15 AM  Result Value Ref Range Status   MRSA, PCR NEGATIVE NEGATIVE Final   Staphylococcus aureus NEGATIVE NEGATIVE Final    Comment:        The Xpert SA Assay (FDA approved for NASAL specimens in patients over 50 years of age), is one component of a comprehensive surveillance program.  Test performance has been validated by [facility] for patients greater than or equal to 36 year old. It is not intended to diagnose infection nor to guide or monitor treatment.     Studies/Results: Dg  Cholangiogram Operative  10/11/2014   CLINICAL DATA:  Cholecystectomy for cholelithiasis and acute cholecystitis. Status post endoscopic removal of common bile duct calculi on 10/09/2014.  EXAM: INTRAOPERATIVE CHOLANGIOGRAM  TECHNIQUE: Cholangiographic images from the C-arm fluoroscopic device were submitted for interpretation post-operatively. Please see the procedural report for the amount  of contrast and the fluoroscopy time utilized.  COMPARISON:  Images from ERCP on 10/09/2014  FINDINGS: Single image from an intraoperative cholangiogram demonstrates mildly irregular contour or of the proper hepatic and common bile ducts without filling defects. The single image submitted does not show contrast in the duodenum. Visualized intrahepatic bile ducts are unremarkable. No contrast extravasation is seen.  IMPRESSION: No biliary filling defects identified. Contrast is not visualized in the duodenum on the spot image submitted.   Electronically Signed   By: Irish LackGlenn  Yamagata M.D.   On: 10/11/2014 12:56   Dg Hand 2 View Right  10/12/2014   CLINICAL DATA:  39 year old male with pain swelling and erythema of the right hand with no known injury. Initial encounter.  EXAM: RIGHT HAND - 2 VIEW  COMPARISON:  None.  FINDINGS: Two views of the right hand. Bone mineralization is within normal limits. Distal radius and ulna appear intact. Carpal bone alignment within normal limits. Healed deformity of the fifth metacarpal AH moped possibly also of the third metacarpal. MCP and IP joint spaces and alignment are within normal limits. No acute osseous abnormality identified. No radiopaque foreign body identified. No subcutaneous gas identified.  IMPRESSION: No acute osseous abnormality identified in the right hand.   Electronically Signed   By: Odessa FlemingH  Hall M.D.   On: 10/12/2014 07:46   Dg Chest Port 1 View  10/10/2014   CLINICAL DATA:  Cough history of sepsis, AIDS, and acute renal failure.  EXAM: PORTABLE CHEST - 1 VIEW  COMPARISON:   None.  FINDINGS: The lungs are adequately inflated. There are coarse near confluent lung markings in the left lower lobe. There are minimal similar appearing increased interstitial markings at the right lung base. The heart and pulmonary vascularity are normal. The mediastinum is normal in width. The bony thorax is unremarkable.  IMPRESSION: Interstitial and/or early alveolar pneumonia in the left lower lobe. A follow-up PA and lateral chest x-ray would be useful.   Electronically Signed   By: David  Swazil[place]Jordan M.D.   On: 10/10/2014 13:34     Assessment/Plan: Acute cholecystitis  Cholecystectomy 5-4 AIDS Hypokalemia Protein calorie malnutrition  Total days of antibiotics: 7 (imipenem) stopped 5-4  Emergency adap will be done once his HIV RNA is resulted. Will start him on augmentin, 4 days post-op.   No further reports of itching. O/w no problems with anbx.  GI pathogen panel negative Needs Hep A total Ab Needs pneumovax, flu vax, Hep B vax Can have him f/u in ID clinic on 5-13 with me         Johny SaxJeffrey Joeanna Howdyshell Infectious Diseases (pager) [phone] [email or web address] 10/12/2014, 12:18 PM  LOS: 6 days

## 2014-10-12 NOTE — Progress Notes (Signed)
Pt is complaining of pain in his right hand and wrist. Both are noted to be swollen, red, painful, and hot to the touch. MD was notified of this change. Per MD order will request a K Pad and elevate pt's arm. Will update MD in 3-4 hours about any change with the hand. Will continue to monitor and educate pt.

## 2014-10-13 LAB — CBC
HCT: 33.9 % — ABNORMAL LOW (ref 39.0–52.0)
Hemoglobin: 11.1 g/dL — ABNORMAL LOW (ref 13.0–17.0)
MCH: 25.6 pg — ABNORMAL LOW (ref 26.0–34.0)
MCHC: 32.7 g/dL (ref 30.0–36.0)
MCV: 78.3 fL (ref 78.0–100.0)
PLATELETS: 175 10*3/uL (ref 150–400)
RBC: 4.33 MIL/uL (ref 4.22–5.81)
RDW: 15.5 % (ref 11.5–15.5)
WBC: 3.7 10*3/uL — ABNORMAL LOW (ref 4.0–10.5)

## 2014-10-13 LAB — QUANTIFERON IN TUBE
QFT TB AG MINUS NIL VALUE: 0.03 IU/mL
QUANTIFERON MITOGEN VALUE: 3.08 [IU]/mL
QUANTIFERON NIL VALUE: 0.18 [IU]/mL
QUANTIFERON TB AG VALUE: 0.21 IU/mL
QUANTIFERON TB GOLD: NEGATIVE

## 2014-10-13 LAB — HEPATITIS A ANTIBODY, TOTAL: HEP A TOTAL AB: POSITIVE — AB

## 2014-10-13 LAB — BASIC METABOLIC PANEL
Anion gap: 6 (ref 5–15)
BUN: <5 mg/dL — ABNORMAL LOW (ref 6–20)
CALCIUM: 6.9 mg/dL — AB (ref 8.9–10.3)
CO2: 35 mmol/L — ABNORMAL HIGH (ref 22–32)
Chloride: 98 mmol/L — ABNORMAL LOW (ref 101–111)
Creatinine, Ser: 0.69 mg/dL (ref 0.61–1.24)
GFR calc Af Amer: >60 mL/min (ref >60)
Glucose, Bld: 85 mg/dL (ref 70–99)
Potassium: 5 mmol/L (ref 3.5–5.1)
Sodium: 139 mmol/L (ref 135–145)

## 2014-10-13 LAB — QUANTIFERON TB GOLD ASSAY (BLOOD)

## 2014-10-13 LAB — MAGNESIUM: Magnesium: 1.4 mg/dL — ABNORMAL LOW (ref 1.7–2.4)

## 2014-10-13 MED ORDER — MAGNESIUM OXIDE 400 (241.3 MG) MG PO TABS
400.0000 mg | ORAL_TABLET | Freq: Two times a day (BID) | ORAL | Status: DC
  Administered 2014-10-13: 400 mg via ORAL
  Filled 2014-10-13 (×2): qty 1

## 2014-10-13 MED ORDER — SODIUM CHLORIDE 0.9 % IV BOLUS (SEPSIS)
500.0000 mL | Freq: Once | INTRAVENOUS | Status: AC
  Administered 2014-10-13: 500 mL via INTRAVENOUS

## 2014-10-13 MED ORDER — SULFAMETHOXAZOLE-TRIMETHOPRIM 400-80 MG PO TABS: 1.0000 | ORAL_TABLET | Freq: Every day | ORAL | Status: DC

## 2014-10-13 MED ORDER — AZITHROMYCIN 600 MG PO TABS: 1200.0000 mg | ORAL_TABLET | ORAL | Status: DC

## 2014-10-13 MED ORDER — HYDROCODONE-ACETAMINOPHEN 5-325 MG PO TABS: 1.0000 | ORAL_TABLET | Freq: Four times a day (QID) | ORAL | Status: DC | PRN

## 2014-10-13 NOTE — Progress Notes (Signed)
NURSING PROGRESS NOTE  Lucinda Dellntonio Hernandez [id number] Discharge Data: 10/13/2014 12:57 PM Attending Provider: Christiane Haorinna L Sullivan, MD PCP:No PCP Per Patient     Lucinda DellAntonio Hernandez to be D/C'd Home per MD order.  Discussed with the patient the After Visit Summary and all questions fully answered using telephone interpretor. All IV's discontinued with no bleeding noted. All belongings returned to patient for patient to take home.   Last Vital Signs:  Blood pressure 113/63, pulse 78, temperature 98.1 F (36.7 C), temperature source Oral, resp. rate 18, height 5\' 10"  (1.778 m), weight 62.4 kg (137 lb 9.1 oz), SpO2 93 %.  Discharge Medication List   Medication List    STOP taking these medications        omeprazole 20 MG capsule  Commonly known as:  PRILOSEC     promethazine 12.5 MG tablet  Commonly known as:  PHENERGAN      TAKE these medications        azithromycin 600 MG tablet  Commonly known as:  ZITHROMAX  Take 2 tablets (1,200 mg total) by mouth once a week. On Mondays     HYDROcodone-acetaminophen 5-325 MG per tablet  Commonly known as:  NORCO/VICODIN  Take 1 tablet by mouth every 6 (six) hours as needed for moderate pain.     multivitamin tablet  Take 1 tablet by mouth daily.     sulfamethoxazole-trimethoprim 400-80 MG per tablet  Commonly known as:  BACTRIM,SEPTRA  Take 1 tablet by mouth daily.         Cathlyn Parsonsattha Damoni Causby, RN

## 2014-10-13 NOTE — Discharge Summary (Signed)
Physician Discharge Summary  [place] ZOX:[id number] DOB: [birth year] DOA: 10/06/2014  PCP: No PCP Per Patient  Admit date: 10/06/2014 Discharge date: 10/13/2014  Time spent: greater than 30 minutes  Recommendations for Outpatient Follow-up:  1. ART to start as outpatient per ID  Discharge Diagnoses:  Principal Problem:   Choledocholithiasis Active Problems:   Hypokalemia   Acute renal failure   Abnormal transaminases   Metabolic acidosis   Diarrhea   Hyponatremia   Cholelithiasis   Protein-calorie malnutrition, severe   Intrahepatic bile duct stones   Hypotension   AIDS (acquired immunodeficiency syndrome), CD4 <=200   Sepsis   Cough   Acute gout   Discharge Condition: stable  Diet recommendation: general  Filed Weights   10/07/14 2102 10/08/14 1500 10/09/14 1824  Weight: 57.788 kg (127 lb 6.4 oz) 57.7 kg (127 lb 3.3 oz) 62.4 kg (137 lb 9.1 oz)    History of present illness:  39 y.o. male with no significant PMH who presents complaining of persistent abdominal pain, nausea, vomiting and diarrhea. He was seen at urgent care on 4-10 for the same. He was prescribe phenergan and Protonix with no improvement. He presents today due persistent and worsening abdominal pain. He report more than 7 watery BM. Also report multiple episodes of vomiting, liquid content. He relates generalized , and RUQ abdominal pain. He report pain is worse with melas. Also diarrhea get worse every time he eats.  Evaluation in the ED; k at 2.5, cr at 1.9, LFT elevated, CT abdomen: Mild diffuse intrahepatic and extrahepatic biliary dilatation with possible choledocholithiasis and cholelithiasis. Consider further evaluation with ERCP or MRCP. Other considerations might include a biliary stricture or cholangitis. He received IV bolus and IV potassium.   Hospital Course:  Choledocholithiasis/cholelithiasis: admitted to stepdown unit. Started on cipro and flagyl. GI and surgery consulted.  MRCP  showed common duct stones with obstruction.  Patient developed septic shock refractory to multiple liters saline and transferred to ICU.  Blood cultures negative. Antibiotics changed to primaxin.  Did not require vasopressors.  Eventually underwent ERCP sphincterotomy.  Subsequently had Status post laparoscopic cholecystectomy.  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 CD4 abs 10.  MAC and PCP prophylaxis started.  ID consulted and Will start ART as outpatient.  Hepatitis ABC negative. Hep A ab positive.  Acute gout right hand:  Postoperatively, developed right hand swelling, erythema and tenderness, especially second MCP. No history of gout previously, but clinically consistent with acute gout. Much improved after 2 doses toradol and cochicine   Hypokalemia secondary to diarrhea: repleted   Diarrhea: c diff negative and GI pathogen panel negative. Resolved after antibiotics  Stopped.  Cough: improved  Hypomagnesemia: secondary to diarrhea: repleted.   Acute renal failure: secondary to dehydration: resolved with hydration   Metabolic acidosis secondary to infection and GI losses. resolved   Hyponatremia, hypovolemic, resolved with IV hydration  Procedures:  ERCP, sphincterotomy  laparascopic cholecystectomy  Consultations:  ID  GI  General surgery  Discharge Exam: Filed Vitals:   10/13/14 1036  BP: 113/63  Pulse: 78  Temp: 98.1 F (36.7 C)  Resp: 18    General: alert, oriented Cardiovascular: RRR Respiratory: CTA abd incisions ok. S, nt, nd  Discharge Instructions   Discharge Instructions    Diet general    Complete by:  As directed      Lifting restrictions    Complete by:  As directed   No heavy lifting     May  shower / Bathe    Complete by:  As directed      Sexual Activity Restrictions    Complete by:  As directed   Use condoms          Current Discharge Medication List    START taking these medications   Details   azithromycin (ZITHROMAX) 600 MG tablet Take 2 tablets (1,200 mg total) by mouth once a week. On Mondays Qty: 4 tablet, Refills: 0    HYDROcodone-acetaminophen (NORCO/VICODIN) 5-325 MG per tablet Take 1 tablet by mouth every 6 (six) hours as needed for moderate pain. Qty: 20 tablet, Refills: 0    sulfamethoxazole-trimethoprim (BACTRIM,SEPTRA) 400-80 MG per tablet Take 1 tablet by mouth daily. Qty: 30 tablet, Refills: 0      CONTINUE these medications which have NOT CHANGED   Details  Multiple Vitamin (MULTIVITAMIN) tablet Take 1 tablet by mouth daily.      STOP taking these medications     omeprazole (PRILOSEC) 20 MG capsule      promethazine (PHENERGAN) 12.5 MG tablet        Allergies  Allergen Reactions  . Penicillins     "hives"   Follow-up Information    Follow up with [facility] On 10/31/2014.   Specialty:  General Surgery   Why:  2:30pm, arrive no later than 2:00pm for paperwork   Contact information:   [address] [place] [address] [phone]       Follow up with [facility]     On 10/18/2014.   Why:  11 am for hospital follow up   Contact information:   [address] [place] [address] [phone]      Follow up with Johny Sax, MD On 10/20/2014.   Specialty:  Infectious Diseases   Contact information:   [address] [place] [address] [phone]        The results of significant diagnostics from this hospitalization (including imaging, microbiology, ancillary and laboratory) are listed below for reference.    Significant Diagnostic Studies: Dg Cholangiogram Operative  10/11/2014   CLINICAL DATA:  Cholecystectomy for cholelithiasis and acute cholecystitis. Status post endoscopic removal of common bile duct calculi on 10/09/2014.  EXAM: INTRAOPERATIVE CHOLANGIOGRAM  TECHNIQUE: Cholangiographic images from the C-arm fluoroscopic device were  submitted for interpretation post-operatively. Please see the procedural report for the amount of contrast and the fluoroscopy time utilized.  COMPARISON:  Images from ERCP on 10/09/2014  FINDINGS: Single image from an intraoperative cholangiogram demonstrates mildly irregular contour or of the proper hepatic and common bile ducts without filling defects. The single image submitted does not show contrast in the duodenum. Visualized intrahepatic bile ducts are unremarkable. No contrast extravasation is seen.  IMPRESSION: No biliary filling defects identified. Contrast is not visualized in the duodenum on the spot image submitted.   Electronically Signed   By: Irish Lack M.D.   On: 10/11/2014 12:56   Ct Abdomen Pelvis W Contrast  10/06/2014   CLINICAL DATA:  Lower abdominal pain for 2 weeks. Nausea and diarrhea. Headache.  EXAM: CT ABDOMEN AND PELVIS WITH CONTRAST  TECHNIQUE: Multidetector CT imaging of the abdomen and pelvis was performed using the standard protocol following bolus administration of intravenous contrast.  CONTRAST:  OMNIPAQUE IOHEXOL 300 MG/ML  SOLN  COMPARISON:  None.  FINDINGS: Visualized lung bases are clear allowing for mild motion artifact. No pleural effusion.  There is mild, diffuse  intrahepatic biliary dilatation. There is also mild extrahepatic biliary dilatation, with the common bile duct measuring 9 mm in diameter. There is suggestion of a small amount of soft tissue density in the distal aspect of the common bile duct (series 2, images 37 and 38), and a similar 10 mm focus is present dependently in the gallbladder, possibly reflecting gallstones. Gallbladder is moderately distended without gross wall thickening. Cystic duct also appears mildly to moderately distended. There is no pancreatic ductal dilatation, and no gross pancreatic or ampullary mass is identified.  3 mm hypodensity is noted in the right hepatic dome. The spleen, adrenal glands, right kidney, and pancreas  have an unremarkable enhanced appearance. 3 mm hypodensity in the upper pole of the left kidney is too small to fully characterize but likely represents a cyst.  Oral contrast is present throughout the small bowel. There is no evidence of bowel obstruction. Liquid stool is present throughout the colon, consistent with history of recent diarrhea. Appendix is identified in the right lower quadrant/ upper pelvis and is unremarkable.  Bladder is unremarkable. No free fluid or enlarged lymph nodes are identified. No acute osseous abnormality is seen.  IMPRESSION: Mild diffuse intrahepatic and extrahepatic biliary dilatation with possible choledocholithiasis and cholelithiasis. Consider further evaluation with ERCP or MRCP. Other considerations might include a biliary stricture or cholangitis.   Electronically Signed   By: Sebastian Ache   On: 10/06/2014 13:09   Dg Hand 2 View Right  10/12/2014   CLINICAL DATA:  39 year old male with pain swelling and erythema of the right hand with no known injury. Initial encounter.  EXAM: RIGHT HAND - 2 VIEW  COMPARISON:  None.  FINDINGS: Two views of the right hand. Bone mineralization is within normal limits. Distal radius and ulna appear intact. Carpal bone alignment within normal limits. Healed deformity of the fifth metacarpal AH moped possibly also of the third metacarpal. MCP and IP joint spaces and alignment are within normal limits. No acute osseous abnormality identified. No radiopaque foreign body identified. No subcutaneous gas identified.  IMPRESSION: No acute osseous abnormality identified in the right hand.   Electronically Signed   By: Odessa Fleming M.D.   On: 10/12/2014 07:46   Mr 3d Recon At Scanner  10/07/2014   CLINICAL DATA:  Right upper quadrant abdominal pain. Nausea and vomiting, and diarrhea. Biliary dilatation and possible choledocholithiasis seen on recent CT.  EXAM: MRI ABDOMEN WITHOUT AND WITH CONTRAST (INCLUDING MRCP)  TECHNIQUE: Multiplanar multisequence MR  imaging of the abdomen was performed both before and after the administration of intravenous contrast. Heavily T2-weighted images of the biliary and pancreatic ducts were obtained, and three-dimensional MRCP images were rendered by post processing.  CONTRAST:  12 mL MultiHance  COMPARISON:  CT on 10/06/2014  FINDINGS: Lower chest:  Unremarkable.  Hepatobiliary: No liver masses are identified. Gallbladder is distended and contains several small gallstones, however there is no evidence of gallbladder wall thickening or pericholecystic inflammatory changes.  Mild diffuse biliary ductal dilatation is demonstrated with common bile duct measuring 8 mm. Two or 3 small less than 1 cm calculi are seen in the distal common bile duct.  Pancreas: No masses, inflammatory changes, or fluid collections demonstrated. No evidence pancreatic ductal dilatation or pancreas divisum.  Spleen:  Within normal limits in size and appearance.  Adrenal Glands:  No masses identified.  Kidneys: No renal masses identified. No evidence of hydronephrosis. Tiny sub-cm cyst is seen in the anterior upper pole of the left kidney.  Stomach/Bowel/Peritoneum: No evidence of wall thickening, mass, or obstruction involving visualized abdominal bowel.  Vascular/Lymphatic: No pathologically enlarged lymph nodes identified. No other significant abnormality noted.  Other:  None.  Musculoskeletal:  No suspicious bone lesions identified.  IMPRESSION: Cholelithiasis, without definite radiographic signs of acute cholecystitis.  Diffuse biliary ductal dilatation secondary to choledocholithiasis; 2 or 3 small less than 1 cm calculi in the distal common bile duct.   Electronically Signed   By: Myles RosenthalJohn  Stahl M.D.   On: 10/07/2014 09:29   Dg Chest Port 1 View  10/10/2014   CLINICAL DATA:  Cough history of sepsis, AIDS, and acute renal failure.  EXAM: PORTABLE CHEST - 1 VIEW  COMPARISON:  None.  FINDINGS: The lungs are adequately inflated. There are coarse near confluent  lung markings in the left lower lobe. There are minimal similar appearing increased interstitial markings at the right lung base. The heart and pulmonary vascularity are normal. The mediastinum is normal in width. The bony thorax is unremarkable.  IMPRESSION: Interstitial and/or early alveolar pneumonia in the left lower lobe. A follow-up PA and lateral chest x-ray would be useful.   Electronically Signed   By: David  Swazil[place]Jordan M.D.   On: 10/10/2014 13:34   Dg Ercp Biliary & Pancreatic Ducts  10/09/2014   CLINICAL DATA:  Cholelithiasis  EXAM: ERCP  TECHNIQUE: Multiple spot images obtained with the fluoroscopic device and submitted for interpretation post-procedure.  FLUOROSCOPY TIME:  3 minutes 38 seconds  COMPARISON:  MR 10/07/2014  FINDINGS: Four fluoroscopic spot images document endoscopic cannulation and opacification of the central biliary tree. Possible filling defects in the mid CBD on the initial image. Subsequent Images document passage of a balloon catheter through the CBD. The intrahepatic bile ducts are incompletely opacified, appearing decompressed centrally. Some contrast flows through the patent cystic duct into the gallbladder. No extravasation identified.  IMPRESSION: 1. ERCP with balloon sweep of the common duct. These images were submitted for radiologic interpretation only. Please see the procedural report for the amount of contrast and the fluoroscopy time utilized.   Electronically Signed   By: Corlis Leak  Hassell M.D.   On: 10/09/2014 14:12   Mr Abd W/wo Cm/mrcp  10/07/2014   CLINICAL DATA:  Right upper quadrant abdominal pain. Nausea and vomiting, and diarrhea. Biliary dilatation and possible choledocholithiasis seen on recent CT.  EXAM: MRI ABDOMEN WITHOUT AND WITH CONTRAST (INCLUDING MRCP)  TECHNIQUE: Multiplanar multisequence MR imaging of the abdomen was performed both before and after the administration of intravenous contrast. Heavily T2-weighted images of the biliary and pancreatic ducts were  obtained, and three-dimensional MRCP images were rendered by post processing.  CONTRAST:  12 mL MultiHance  COMPARISON:  CT on 10/06/2014  FINDINGS: Lower chest:  Unremarkable.  Hepatobiliary: No liver masses are identified. Gallbladder is distended and contains several small gallstones, however there is no evidence of gallbladder wall thickening or pericholecystic inflammatory changes.  Mild diffuse biliary ductal dilatation is demonstrated with common bile duct measuring 8 mm. Two or 3 small less than 1 cm calculi are seen in the distal common bile duct.  Pancreas: No masses, inflammatory changes, or fluid collections demonstrated. No evidence pancreatic ductal dilatation or pancreas divisum.  Spleen:  Within normal limits in size and appearance.  Adrenal Glands:  No masses identified.  Kidneys: No renal masses identified. No evidence of hydronephrosis. Tiny sub-cm cyst is seen in the anterior upper pole of the left kidney.  Stomach/Bowel/Peritoneum: No evidence of wall thickening, mass, or obstruction involving  visualized abdominal bowel.  Vascular/Lymphatic: No pathologically enlarged lymph nodes identified. No other significant abnormality noted.  Other:  None.  Musculoskeletal:  No suspicious bone lesions identified.  IMPRESSION: Cholelithiasis, without definite radiographic signs of acute cholecystitis.  Diffuse biliary ductal dilatation secondary to choledocholithiasis; 2 or 3 small less than 1 cm calculi in the distal common bile duct.   Electronically Signed   By: Myles Rosenthal M.D.   On: 10/07/2014 09:29    Microbiology: Recent Results (from the past 240 hour(s))  Culture, blood (routine x 2)     Status: None   Collection Time: 10/06/14  3:39 PM  Result Value Ref Range Status   Specimen Description BLOOD RIGHT WRIST  Final   Special Requests BOTTLES DRAWN AEROBIC AND ANAEROBIC 5CC  Final   Culture   Final    NO GROWTH 5 DAYS Performed at [facility]    Report Status 10/12/2014 FINAL   Final  Culture, blood (routine x 2)     Status: None   Collection Time: 10/06/14  3:43 PM  Result Value Ref Range Status   Specimen Description BLOOD RIGHT ARM  Final   Special Requests BOTTLES DRAWN AEROBIC AND ANAEROBIC 5CC  Final   Culture   Final    NO GROWTH 5 DAYS Performed at [facility]    Report Status 10/12/2014 FINAL  Final  MRSA PCR Screening     Status: None   Collection Time: 10/06/14  6:58 PM  Result Value Ref Range Status   MRSA by PCR NEGATIVE NEGATIVE Final    Comment:        The GeneXpert MRSA Assay (FDA approved for NASAL specimens only), is one component of a comprehensive MRSA colonization surveillance program. It is not intended to diagnose MRSA infection nor to guide or monitor treatment for MRSA infections.   Clostridium Difficile by PCR     Status: None   Collection Time: 10/08/14  9:00 AM  Result Value Ref Range Status   C difficile by pcr NEGATIVE NEGATIVE Final  Culture, blood (x 2)     Status: None (Preliminary result)   Collection Time: 10/08/14  1:55 PM  Result Value Ref Range Status   Specimen Description BLOOD LEFT HAND  Final   Special Requests BOTTLES DRAWN AEROBIC ONLY  9CC  Final   Culture   Final           BLOOD CULTURE RECEIVED NO GROWTH TO DATE CULTURE WILL BE HELD FOR 5 DAYS BEFORE ISSUING A FINAL NEGATIVE REPORT Performed at [facility]    Report Status PENDING  Incomplete  Culture, blood (x 2)     Status: None (Preliminary result)   Collection Time: 10/08/14  2:03 PM  Result Value Ref Range Status   Specimen Description BLOOD RIGHT HAND  Final   Special Requests BOTTLES DRAWN AEROBIC ONLY  3CC  Final   Culture   Final           BLOOD CULTURE RECEIVED NO GROWTH TO DATE CULTURE WILL BE HELD FOR 5 DAYS BEFORE ISSUING A FINAL NEGATIVE REPORT Performed at [facility]    Report Status PENDING  Incomplete  Surgical pcr screen     Status: None   Collection Time: 10/11/14 10:15 AM  Result Value Ref Range  Status   MRSA, PCR NEGATIVE NEGATIVE Final   Staphylococcus aureus NEGATIVE NEGATIVE Final    Comment:        The Xpert SA Assay (FDA  approved for NASAL specimens in patients over 39 years of age), is one component of a comprehensive surveillance program.  Test performance has been validated by [facility] for patients greater than or equal to 39 year old. It is not intended to diagnose infection nor to guide or monitor treatment.      Labs: Basic Metabolic Panel:  Recent Labs Lab 10/07/14 1640  10/09/14 0300 10/09/14 0936 10/10/14 1100 10/11/14 0600 10/12/14 0616 10/13/14 0523  NA  --   < > 141 142 140 140 138 139  K  --   < > 2.8* 2.8* 2.2* 2.8* 3.7 5.0  CL  --   < > 118* 116* 109 104 98* 98*  CO2  --   < > 14* 17* 22 28 33* 35*  GLUCOSE  --   < > 80 87 132* 90 76 85  BUN  --   < > 6 5* <5* <5* <5* <5*  CREATININE  --   < > 1.04 0.96 0.92 0.73 0.73 0.69  CALCIUM  --   < > 7.6* 7.5* 6.5* 6.7* 6.4* 6.9*  MG 1.8  --  1.3*  --   --   --  0.9* 1.4*  < > = values in this interval not displayed. Liver Function Tests:  Recent Labs Lab 10/06/14 1159 10/07/14 0302 10/09/14 0300  AST 66* 47* 32  ALT 50 35 27  ALKPHOS 134* 89 63  BILITOT 1.8* 1.0 1.0  PROT 8.1 5.5* 4.4*  ALBUMIN 3.7 2.5* 2.1*    Recent Labs Lab 10/06/14 1159  LIPASE 36   No results for input(s): AMMONIA in the last 168 hours. CBC:  Recent Labs Lab 10/06/14 1159 10/07/14 0302 10/09/14 0300 10/13/14 0944  WBC 6.4 5.0 3.4* 3.7*  NEUTROABS 4.2  --  1.6*  --   HGB 15.5 11.3* 10.0* 11.1*  HCT 43.4 31.6* 29.0* 33.9*  MCV 73.1* 72.1* 74.0* 78.3  PLT 302 192 144* 175   Cardiac Enzymes: No results for input(s): CKTOTAL, CKMB, CKMBINDEX, TROPONINI in the last 168 hours. BNP: BNP (last 3 results) No results for input(s): BNP in the last 8760 hours.  ProBNP (last 3 results) No results for input(s): PROBNP in the last 8760 hours.  CBG: No results for input(s): GLUCAP in the last 168  hours.     SignedChristiane Ha:  Chandelle Harkey L  [facility] 10/13/2014, 11:26 AM

## 2014-10-13 NOTE — Care Management Note (Signed)
Case Management Note  Patient Details  Name: Lucinda Dellntonio Hernandez MRN: [id number] Date of Birth: [birth year]  Subjective/Objective:       Patient is s/p lap chole,  NCM spoke with patient on 10/12/14 with intrepreter over the phone Sharen HeckGracella, information given to patient about [facility] apt on 5/11 at 11am and the information with phone numbers and address on how to get to the clinic.  Patient was very appreciative of information.              Action/Plan:  Plan for patient to dc home.  Expected Discharge Date:  10/09/14               Expected Discharge Plan:  Home/Self Care  In-House Referral:     Discharge planning Services  CM Consult, Indigent Health Clinic, Follow-up appt scheduled  Post Acute Care Choice:    Choice offered to:     DME Arranged:    DME Agency:     HH Arranged:    HH Agency:     Status of Service:  Completed, signed off  Medicare Important Message Given:  No Date Medicare IM Given:    Medicare IM give by:    Date Additional Medicare IM Given:    Additional Medicare Important Message give by:     If discussed at Long Length of Stay Meetings, dates discussed:    Additional Comments:  Leone Havenaylor, Brecklynn Jian Clinton, RN 10/13/2014, 10:47 AM

## 2014-10-13 NOTE — Progress Notes (Signed)
To whom it may concern:   Jon Love is unable to work 10/06/14 through 10/20/14 due to illness.    Sincerely,     Crista Curborinna Mekaylah Klich, MD [facility]

## 2014-10-14 LAB — CULTURE, BLOOD (ROUTINE X 2)
CULTURE: NO GROWTH
Culture: NO GROWTH

## 2014-10-16 ENCOUNTER — Encounter (HOSPITAL_COMMUNITY): Payer: Self-pay | Admitting: Surgery

## 2014-10-18 ENCOUNTER — Inpatient Hospital Stay: Payer: Self-pay | Admitting: Family Medicine

## 2014-10-18 LAB — HIV-1 INTEGRASE GENOTYPE
Date Viral Load Collected: NO GROWTH
VALUE LAST VIRAL LOAD: NO GROWTH

## 2014-10-20 ENCOUNTER — Encounter: Payer: Self-pay | Admitting: Infectious Diseases

## 2014-10-20 ENCOUNTER — Encounter: Payer: Self-pay | Admitting: *Deleted

## 2014-10-20 ENCOUNTER — Ambulatory Visit (INDEPENDENT_AMBULATORY_CARE_PROVIDER_SITE_OTHER): Payer: Self-pay | Admitting: Infectious Diseases

## 2014-10-20 ENCOUNTER — Other Ambulatory Visit: Payer: Self-pay | Admitting: *Deleted

## 2014-10-20 VITALS — BP 97/62 | HR 116 | Temp 98.1°F | Ht 68.5 in | Wt 134.0 lb

## 2014-10-20 DIAGNOSIS — B20 Human immunodeficiency virus [HIV] disease: Secondary | ICD-10-CM

## 2014-10-20 DIAGNOSIS — Z23 Encounter for immunization: Secondary | ICD-10-CM

## 2014-10-20 MED ORDER — AZITHROMYCIN 600 MG PO TABS: 1200.0000 mg | ORAL_TABLET | ORAL | Status: DC

## 2014-10-20 MED ORDER — SULFAMETHOXAZOLE-TRIMETHOPRIM 400-80 MG PO TABS: 1.0000 | ORAL_TABLET | Freq: Every day | ORAL | Status: DC

## 2014-10-20 NOTE — Assessment & Plan Note (Signed)
Will have him return on Monday to meet with ADAP coordinator.  Given condoms Given hep B vax. Needs pnvax, flu as well.  Refill azithro.  Get him into dental clinic.  rtc after he gets ADAP.

## 2014-10-20 NOTE — Progress Notes (Signed)
   Subjective:    Patient ID: Lucinda Dellntonio Hernandez, male    DOB: 1975/11/05, 39 y.o.   MRN: [id number]  HPI 39 [name] in [place] 10 [place], [facility] with abd pain. He underwent lap-chole and was also found to be HIV+.  He was to get emergency ADAP however this has not been clarified.   CD4 T CELL ABS (/uL)  Date Value  10/08/2014 10*   Has been on bactrim since d/c. States his abd pain is better.   Review of Systems     Objective:   Physical Exam  Constitutional: He appears well-developed and well-nourished.  HENT:  Mouth/Throat: No oropharyngeal exudate.  Eyes: EOM are normal. Pupils are equal, round, and reactive to light.  Neck: Neck supple.  Cardiovascular: Normal rate, regular rhythm and normal heart sounds.   Pulmonary/Chest: Effort normal and breath sounds normal.  Abdominal: Soft. Bowel sounds are normal. He exhibits no distension. There is no tenderness.  His wounds are well healed.   Musculoskeletal: He exhibits no edema.  Lymphadenopathy:    He has no cervical adenopathy.      Assessment & Plan:

## 2014-10-23 ENCOUNTER — Other Ambulatory Visit: Payer: Self-pay | Admitting: *Deleted

## 2014-10-23 ENCOUNTER — Telehealth: Payer: Self-pay | Admitting: *Deleted

## 2014-10-23 ENCOUNTER — Other Ambulatory Visit: Payer: Self-pay | Admitting: Infectious Diseases

## 2014-10-23 DIAGNOSIS — B20 Human immunodeficiency virus [HIV] disease: Secondary | ICD-10-CM

## 2014-10-23 MED ORDER — ELVITEG-COBIC-EMTRICIT-TENOFAF 150-150-200-10 MG PO TABS: 1.0000 | ORAL_TABLET | Freq: Every day | ORAL | Status: DC

## 2014-10-23 NOTE — Telephone Encounter (Signed)
i put orders in for these things thanks

## 2014-10-23 NOTE — Telephone Encounter (Signed)
Patient did not have a HIV viral load drawn while in the hospital - only a 4th generation screen, KMMN*[id number], and integrase genotype.  Patient's ADAP application is on hold per [facility] until they have a viral load. Left message using [employer] to have patient return to clinic for this lab to be drawn [facility] [phone], Imagene Gurney).    Robert Bellow, [facility] is working on alternative medication procurement in the meantime, but needs to know what you would like to start him on.  There are only rx for bactrim and azithromycin.   Please advise the HIV medication for Sharyn Lull to procure. Landis Gandy, RN

## 2014-10-27 ENCOUNTER — Telehealth: Payer: Self-pay

## 2014-10-27 NOTE — Telephone Encounter (Signed)
Patient has already seen ID physician on 10-20-14

## 2014-11-01 ENCOUNTER — Other Ambulatory Visit: Payer: Self-pay | Admitting: *Deleted

## 2014-11-01 DIAGNOSIS — B20 Human immunodeficiency virus [HIV] disease: Secondary | ICD-10-CM

## 2014-11-01 MED ORDER — ELVITEG-COBIC-EMTRICIT-TENOFAF 150-150-200-10 MG PO TABS: 1.0000 | ORAL_TABLET | Freq: Every day | ORAL | Status: DC

## 2014-11-01 NOTE — Telephone Encounter (Signed)
ADAP Application 

## 2014-12-18 ENCOUNTER — Ambulatory Visit (INDEPENDENT_AMBULATORY_CARE_PROVIDER_SITE_OTHER): Payer: Self-pay | Admitting: Infectious Diseases

## 2014-12-18 ENCOUNTER — Other Ambulatory Visit: Payer: Self-pay

## 2014-12-18 ENCOUNTER — Encounter: Payer: Self-pay | Admitting: Infectious Diseases

## 2014-12-18 VITALS — BP 132/84 | HR 93 | Temp 98.9°F | Wt 147.0 lb

## 2014-12-18 DIAGNOSIS — Z23 Encounter for immunization: Secondary | ICD-10-CM

## 2014-12-18 DIAGNOSIS — N179 Acute kidney failure, unspecified: Secondary | ICD-10-CM

## 2014-12-18 DIAGNOSIS — B20 Human immunodeficiency virus [HIV] disease: Secondary | ICD-10-CM

## 2014-12-18 NOTE — Progress Notes (Signed)
   Subjective:    Patient ID: Jon Love, male    DOB: Oct 09, 1975, 39 y.o.   MRN: [id number]  HPI 39 yo [place] in [place] 10 [place], [facility] with abd pain. He underwent lap-chole and was also found to be HIV+.  He was started on genvoya which he has not been able to get.  He was supposed to have his ADAP renewed today.  Has been feeling well.   CD4 T CELL ABS (/uL)  Date Value  10/08/2014 10*    Review of Systems  Constitutional: Negative for appetite change and unexpected weight change.  Gastrointestinal: Negative for abdominal pain, constipation and blood in stool.  Genitourinary: Negative for difficulty urinating.       Objective:   Physical Exam  Constitutional: He appears well-developed and well-nourished.  HENT:  Mouth/Throat: No oropharyngeal exudate.  Eyes: EOM are normal. Pupils are equal, round, and reactive to light.  Neck: Neck supple.  Cardiovascular: Normal rate, regular rhythm and normal heart sounds.   Pulmonary/Chest: Effort normal and breath sounds normal.  Abdominal: Soft. Bowel sounds are normal. He exhibits no distension. There is no tenderness.  Lymphadenopathy:    He has no cervical adenopathy.       Assessment & Plan:

## 2014-12-18 NOTE — Addendum Note (Signed)
Addended by: Wendall MolaOCKERHAM, Kaileah Shevchenko A on: 12/18/2014 11:43 AM   Modules accepted: Orders

## 2014-12-18 NOTE — Assessment & Plan Note (Signed)
Has resolved. Will continue to follow his Cr.

## 2014-12-18 NOTE — Assessment & Plan Note (Signed)
Needs to meet with ADAP today Given condoms Needs dental Needs next Hep B rtc 6 weeks.

## 2015-01-29 ENCOUNTER — Ambulatory Visit: Payer: Self-pay | Admitting: Infectious Diseases

## 2015-03-15 ENCOUNTER — Emergency Department (HOSPITAL_COMMUNITY): Payer: Self-pay

## 2015-03-15 ENCOUNTER — Encounter (HOSPITAL_COMMUNITY): Payer: Self-pay | Admitting: Emergency Medicine

## 2015-03-15 ENCOUNTER — Inpatient Hospital Stay (HOSPITAL_COMMUNITY)
Admission: EM | Admit: 2015-03-15 | Discharge: 2015-03-23 | DRG: 974 | Disposition: A | Discharge status: Home / Self Care | Payer: Self-pay | Attending: Internal Medicine | Admitting: Internal Medicine

## 2015-03-15 DIAGNOSIS — Z9114 Patient's other noncompliance with medication regimen: Secondary | ICD-10-CM

## 2015-03-15 DIAGNOSIS — R630 Anorexia: Secondary | ICD-10-CM | POA: Diagnosis present

## 2015-03-15 DIAGNOSIS — E872 Acidosis, unspecified: Secondary | ICD-10-CM

## 2015-03-15 DIAGNOSIS — B450 Pulmonary cryptococcosis: Secondary | ICD-10-CM | POA: Diagnosis present

## 2015-03-15 DIAGNOSIS — Z9049 Acquired absence of other specified parts of digestive tract: Secondary | ICD-10-CM

## 2015-03-15 DIAGNOSIS — Z880 Allergy status to penicillin: Secondary | ICD-10-CM

## 2015-03-15 DIAGNOSIS — K219 Gastro-esophageal reflux disease without esophagitis: Secondary | ICD-10-CM | POA: Insufficient documentation

## 2015-03-15 DIAGNOSIS — Z79899 Other long term (current) drug therapy: Secondary | ICD-10-CM

## 2015-03-15 DIAGNOSIS — R0902 Hypoxemia: Secondary | ICD-10-CM | POA: Insufficient documentation

## 2015-03-15 DIAGNOSIS — B59 Pneumocystosis: Secondary | ICD-10-CM

## 2015-03-15 DIAGNOSIS — Z681 Body mass index (BMI) 19 or less, adult: Secondary | ICD-10-CM

## 2015-03-15 DIAGNOSIS — J9600 Acute respiratory failure, unspecified whether with hypoxia or hypercapnia: Secondary | ICD-10-CM | POA: Diagnosis present

## 2015-03-15 DIAGNOSIS — R64 Cachexia: Secondary | ICD-10-CM | POA: Diagnosis present

## 2015-03-15 DIAGNOSIS — A419 Sepsis, unspecified organism: Secondary | ICD-10-CM

## 2015-03-15 DIAGNOSIS — E119 Type 2 diabetes mellitus without complications: Secondary | ICD-10-CM | POA: Diagnosis present

## 2015-03-15 DIAGNOSIS — J9601 Acute respiratory failure with hypoxia: Secondary | ICD-10-CM | POA: Diagnosis present

## 2015-03-15 DIAGNOSIS — E871 Hypo-osmolality and hyponatremia: Secondary | ICD-10-CM | POA: Diagnosis present

## 2015-03-15 DIAGNOSIS — B3781 Candidal esophagitis: Secondary | ICD-10-CM | POA: Diagnosis present

## 2015-03-15 DIAGNOSIS — B370 Candidal stomatitis: Secondary | ICD-10-CM | POA: Insufficient documentation

## 2015-03-15 DIAGNOSIS — B20 Human immunodeficiency virus [HIV] disease: Principal | ICD-10-CM | POA: Diagnosis present

## 2015-03-15 DIAGNOSIS — J189 Pneumonia, unspecified organism: Secondary | ICD-10-CM | POA: Diagnosis present

## 2015-03-15 DIAGNOSIS — J154 Pneumonia due to other streptococci: Secondary | ICD-10-CM | POA: Diagnosis present

## 2015-03-15 HISTORY — DX: Human immunodeficiency virus (HIV) disease: B20

## 2015-03-15 LAB — URINALYSIS, ROUTINE W REFLEX MICROSCOPIC
Glucose, UA: NEGATIVE mg/dL
Hgb urine dipstick: NEGATIVE
Ketones, ur: 40 mg/dL — AB
Leukocytes, UA: NEGATIVE
Nitrite: NEGATIVE
Protein, ur: 30 mg/dL — AB
Specific Gravity, Urine: 1.021 (ref 1.005–1.030)
Urobilinogen, UA: 1 mg/dL (ref 0.0–1.0)
pH: 6 (ref 5.0–8.0)

## 2015-03-15 LAB — CBC
HCT: 42.2 % (ref 39.0–52.0)
Hemoglobin: 14.6 g/dL (ref 13.0–17.0)
MCH: 24.6 pg — ABNORMAL LOW (ref 26.0–34.0)
MCHC: 34.6 g/dL (ref 30.0–36.0)
MCV: 71 fL — ABNORMAL LOW (ref 78.0–100.0)
Platelets: 368 10*3/uL (ref 150–400)
RBC: 5.94 MIL/uL — ABNORMAL HIGH (ref 4.22–5.81)
RDW: 14.8 % (ref 11.5–15.5)
WBC: 10.7 10*3/uL — ABNORMAL HIGH (ref 4.0–10.5)

## 2015-03-15 LAB — COMPREHENSIVE METABOLIC PANEL
ALT: 22 U/L (ref 17–63)
AST: 22 U/L (ref 15–41)
Albumin: 3.3 g/dL — ABNORMAL LOW (ref 3.5–5.0)
Alkaline Phosphatase: 93 U/L (ref 38–126)
Anion gap: 16 — ABNORMAL HIGH (ref 5–15)
BUN: 11 mg/dL (ref 6–20)
CO2: 20 mmol/L — ABNORMAL LOW (ref 22–32)
Calcium: 9.2 mg/dL (ref 8.9–10.3)
Chloride: 93 mmol/L — ABNORMAL LOW (ref 101–111)
Creatinine, Ser: 1.02 mg/dL (ref 0.61–1.24)
GFR calc Af Amer: >60 mL/min (ref >60)
GFR calc non Af Amer: >60 mL/min (ref >60)
Glucose, Bld: 122 mg/dL — ABNORMAL HIGH (ref 65–99)
Potassium: 3.7 mmol/L (ref 3.5–5.1)
Sodium: 129 mmol/L — ABNORMAL LOW (ref 135–145)
Total Bilirubin: 1.2 mg/dL (ref 0.3–1.2)
Total Protein: 7.3 g/dL (ref 6.5–8.1)

## 2015-03-15 LAB — URINE MICROSCOPIC-ADD ON

## 2015-03-15 LAB — LACTIC ACID, PLASMA: Lactic Acid, Venous: 1.4 mmol/L (ref 0.5–2.0)

## 2015-03-15 LAB — LACTATE DEHYDROGENASE: LDH: 198 U/L — ABNORMAL HIGH (ref 98–192)

## 2015-03-15 LAB — LIPASE, BLOOD: Lipase: 21 U/L — ABNORMAL LOW (ref 22–51)

## 2015-03-15 MED ORDER — IPRATROPIUM-ALBUTEROL 0.5-2.5 (3) MG/3ML IN SOLN
3.0000 mL | Freq: Once | RESPIRATORY_TRACT | Status: AC
  Administered 2015-03-15: 3 mL via RESPIRATORY_TRACT
  Filled 2015-03-15: qty 3

## 2015-03-15 MED ORDER — IOHEXOL 300 MG/ML  SOLN: 25.0000 mL | Freq: Once | INTRAMUSCULAR | Status: DC | PRN

## 2015-03-15 MED ORDER — SULFAMETHOXAZOLE-TRIMETHOPRIM 400-80 MG/5ML IV SOLN
320.0000 mg | Freq: Once | INTRAVENOUS | Status: AC
  Administered 2015-03-15: 320 mg via INTRAVENOUS
  Filled 2015-03-15: qty 20

## 2015-03-15 MED ORDER — VANCOMYCIN HCL IN DEXTROSE 1-5 GM/200ML-% IV SOLN
1000.0000 mg | Freq: Once | INTRAVENOUS | Status: DC
  Administered 2015-03-16: 1000 mg via INTRAVENOUS
  Filled 2015-03-15: qty 200

## 2015-03-15 MED ORDER — ALBUTEROL SULFATE (2.5 MG/3ML) 0.083% IN NEBU
INHALATION_SOLUTION | RESPIRATORY_TRACT | Status: AC
  Filled 2015-03-15: qty 6

## 2015-03-15 MED ORDER — SODIUM CHLORIDE 0.9 % IV SOLN
1000.0000 mL | INTRAVENOUS | Status: DC
  Administered 2015-03-16: 1000 mL via INTRAVENOUS

## 2015-03-15 MED ORDER — ACETAMINOPHEN 325 MG PO TABS
650.0000 mg | ORAL_TABLET | Freq: Once | ORAL | Status: AC
  Administered 2015-03-15: 650 mg via ORAL
  Filled 2015-03-15: qty 2

## 2015-03-15 MED ORDER — IOHEXOL 300 MG/ML  SOLN
100.0000 mL | Freq: Once | INTRAMUSCULAR | Status: AC | PRN
  Administered 2015-03-15: 100 mL via INTRAVENOUS

## 2015-03-15 MED ORDER — LEVOFLOXACIN IN D5W 750 MG/150ML IV SOLN
750.0000 mg | Freq: Once | INTRAVENOUS | Status: AC
  Administered 2015-03-15: 750 mg via INTRAVENOUS
  Filled 2015-03-15: qty 150

## 2015-03-15 MED ORDER — SODIUM CHLORIDE 0.9 % IV BOLUS (SEPSIS)
500.0000 mL | Freq: Once | INTRAVENOUS | Status: AC
  Administered 2015-03-16: 500 mL via INTRAVENOUS

## 2015-03-15 MED ORDER — SODIUM CHLORIDE 0.9 % IV BOLUS (SEPSIS)
1000.0000 mL | Freq: Once | INTRAVENOUS | Status: AC
  Administered 2015-03-15: 1000 mL via INTRAVENOUS

## 2015-03-15 MED ORDER — FENTANYL CITRATE (PF) 100 MCG/2ML IJ SOLN
50.0000 ug | INTRAMUSCULAR | Status: DC | PRN
  Administered 2015-03-15: 50 ug via INTRAVENOUS
  Filled 2015-03-15: qty 2

## 2015-03-15 NOTE — H&P (Signed)
[facility] History and Physical  Leopold Smyers ZOX:[id number] DOB: 1976/02/06 DOA: 03/15/2015  Referring physician: Raeford Razor, MD PCP: No PCP Per Patient   Chief Complaint: Shortness of breath  HPI: Jon Love is a 39 y.o. male with history of HIV AIDS presents with shortness of breath. Patient has a history of HIV and has not been compliant with his medications. Patient was last seen in the ID clinic on 12/18/14. He has not been taking HAART therapy. He presents with increasing shortness of breath and also has been having a cough. Patient in association with this has had abdominal pain and nausea and vomiting. Patient has also been having fevers. In the ED he had a CXR shows bilateral infiltrates consistent with pneumonitis. His last CD4 count was done in May which was 10 and there has not been a follow up count done. Patietn was also ntoed to be hypoxic in the ED with saturations in the mid 80s   Review of Systems:  Patient is not able to provide a ROS secondary to language barrier   Past Medical History  Diagnosis Date  . HIV disease [facility])    Past Surgical History  Procedure Laterality Date  . Ercp N/A 10/09/2014    Procedure: ENDOSCOPIC RETROGRADE CHOLANGIOPANCREATOGRAPHY (ERCP);  Surgeon: Jeani Hawking, MD;  Location: [facility] ENDOSCOPY;  Service: Endoscopy;  Laterality: N/A;  . Cholecystectomy N/A 10/11/2014    Procedure: LAPAROSCOPIC CHOLECYSTECTOMY WITH INTRAOPERATIVE CHOLANGIOGRAM;  Surgeon: Abigail Miyamoto, MD;  Location: [facility] OR;  Service: General;  Laterality: N/A;   Social History:  reports that he has never smoked. He has never used smokeless tobacco. He reports that he drinks alcohol. He reports that he does not use illicit drugs.  Allergies  Allergen Reactions  . Penicillins Hives    Has patient had a PCN reaction causing immediate rash, facial/tongue/throat swelling, SOB or lightheadedness with hypotension: yes Has patient had a PCN reaction causing severe  rash involving mucus membranes or skin necrosis: no Has patient had a PCN reaction that required hospitalization yes Has patient had a PCN reaction occurring within the last 10 years: yes If all of the above answers are "NO", then may proceed with Cephalosporin use.    No family history on file.   Prior to Admission medications   Medication Sig Start Date End Date Taking? Authorizing Provider  bismuth subsalicylate (PEPTO BISMOL) 262 MG/15ML suspension Take 30 mLs by mouth every 6 (six) hours as needed for indigestion or diarrhea or loose stools.   Yes Historical Provider, MD  azithromycin (ZITHROMAX) 600 MG tablet Take 2 tablets (1,200 mg total) by mouth once a week. On Mondays Patient not taking: Reported on 12/18/2014 10/20/14   Ginnie Smart, MD  elvitegravir-cobicistat-emtricitabine-tenofovir (GENVOYA) 150-150-200-10 MG TABS tablet Take 1 tablet by mouth daily with breakfast. Patient not taking: Reported on 12/18/2014 11/01/14   Randall Hiss, MD  sulfamethoxazole-trimethoprim (BACTRIM,SEPTRA) 400-80 MG per tablet Take 1 tablet by mouth daily. Patient not taking: Reported on 12/18/2014 10/20/14   Ginnie Smart, MD   Physical Exam: Filed Vitals:   03/15/15 2215 03/15/15 2245 03/15/15 2300 03/15/15 2315  BP: 111/73 122/75 125/73 113/68  Pulse: 98 102 97 99  Temp:      TempSrc:      Resp: Height:      Weight:      SpO2: 88% 94% 96% 94%    Wt Readings from Last 3 Encounters:  03/15/15 57.153 kg (126 lb)  12/18/14 66.679 kg (147 lb)  10/20/14 60.782 kg (134 lb)    General:  Appears calm and comfortable Eyes: PERRL, normal lids, irises & conjunctiva ENT: grossly normal hearing, lips & tongue Neck: no LAD, masses or thyromegaly Cardiovascular: RRR, no m/r/g. No LE edema. Respiratory: CTA bilaterally with few scattered ronchi, Normal respiratory effort. Abdomen: soft, ntnd Skin: no rash or induration seen on limited exam Musculoskeletal: grossly normal  tone BUE/BLE Psychiatric: unable to assess due to language Neurologic: grossly appears to be moving all four extremities gait not checked          Labs on Admission:  Basic Metabolic Panel:  Recent Labs Lab 03/15/15 2054  NA 129*  K 3.7  CL 93*  CO2 20*  GLUCOSE 122*  BUN 11  CREATININE 1.02  CALCIUM 9.2   Liver Function Tests:  Recent Labs Lab 03/15/15 2054  AST 22  ALT 22  ALKPHOS 93  BILITOT 1.2  PROT 7.3  ALBUMIN 3.3*    Recent Labs Lab 03/15/15 2054  LIPASE 21*   No results for input(s): AMMONIA in the last 168 hours. CBC:  Recent Labs Lab 03/15/15 2054  WBC 10.7*  HGB 14.6  HCT 42.2  MCV 71.0*  PLT 368   Cardiac Enzymes: No results for input(s): CKTOTAL, CKMB, CKMBINDEX, TROPONINI in the last 168 hours.  BNP (last 3 results) No results for input(s): BNP in the last 8760 hours.  ProBNP (last 3 results) No results for input(s): PROBNP in the last 8760 hours.  CBG: No results for input(s): GLUCAP in the last 168 hours.  Radiological Exams on Admission: Dg Chest 2 View  03/15/2015   CLINICAL DATA:  Productive cough for 2 weeks  EXAM: CHEST - 2 VIEW  COMPARISON:  10/10/2014  FINDINGS: Cardiac shadow is within normal limits. Bibasilar infiltrates are noted right greater than left in the lower lobes. No sizable effusion is noted. No acute bony abnormality is seen. Bilateral nipple shadows are noted.  IMPRESSION: Bilateral lower lobe infiltrates right greater than left.   Electronically Signed   By: Alcide Clever M.D.   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 Possible PCP vs HCAP -will admit to telemetry -STAT ABG ordered -LDH noted to be elevated in the ED -will get sputum for cultures and PCP and AFB -will start on Levaquin and Bactrim pharmacy to dose Bactrim -will also start on adjunctive  therapy with steroids now  BID with taper after 5 days  2. AIDS -will check CD4 Count -will check viral loads -patient needs to be compliant with antretrovirals  3. Hyponatremia -likely due to underlying pneumonia -repeat labs in am     Code Status: full code (must indicate code status--if unknown or must be presumed, indicate so) DVT Prophylaxis:heparin Family Communication: none (indicate person spoken with, if applicable, with phone number if by telephone) Disposition Plan: home (indicate anticipated LOS)    [facility] A [facility] Pager [phone]

## 2015-03-15 NOTE — ED Notes (Signed)
Pt. presents with multiple complaints : RUQ pain for 2 weeks with nausea and diarrhea , productive cough , conjunctivitis , sore throat , fatigue and requesting medications for his HIV disease , pt. O2 sat = 85% at triage .

## 2015-03-15 NOTE — ED Provider Notes (Signed)
CSN: [id number]     Arrival date & time 03/15/15  1942 History   First MD Initiated Contact with Patient 03/15/15 2109     Chief Complaint  Patient presents with  . Abdominal Pain  . Cough  . Sore Throat  . Conjunctivitis     (Consider location/radiation/quality/duration/timing/severity/associated sxs/prior Treatment) HPI   39 year old male with multiple complaints. Language barrier. Interpreter was used. Endorses almost a two-week history of non-productive cough and worsening dyspnea. 3 day history of worsening abdominal pain. Right sided. Worse with movements. Nausea. Vomiting and diarrhea 3-4 days ago. Took pepto bismol and diarrhea resolved. No further vomiting either. Anorexia.  No urinary complaints. Subjective fever. Surgical history significant for cholecystectomy. Patient has a past history of HIV. Diagnosed when hospitalized in May with choledocholithiasis. CD4 count at that time was 10. Appears to have seen ID in follow-up once after discharge but not consistently been on anti retrovirals or prophylactic abx.   Past Medical History  Diagnosis Date  . HIV disease [facility])    Past Surgical History  Procedure Laterality Date  . Ercp N/A 10/09/2014    Procedure: ENDOSCOPIC RETROGRADE CHOLANGIOPANCREATOGRAPHY (ERCP);  Surgeon: Jeani Hawking, MD;  Location: [facility] ENDOSCOPY;  Service: Endoscopy;  Laterality: N/A;  . Cholecystectomy N/A 10/11/2014    Procedure: LAPAROSCOPIC CHOLECYSTECTOMY WITH INTRAOPERATIVE CHOLANGIOGRAM;  Surgeon: Abigail Miyamoto, MD;  Location: [facility] OR;  Service: General;  Laterality: N/A;   No family history on file. Social History  Substance Use Topics  . Smoking status: Never Smoker   . Smokeless tobacco: Never Used  . Alcohol Use: 0.0 oz/week    0 Standard drinks or equivalent per week     Comment: beer    Review of Systems  All systems reviewed and negative, other than as noted in HPI.   Allergies  Penicillins  Home Medications   Prior to Admission  medications   Medication Sig Start Date End Date Taking? Authorizing Provider  azithromycin (ZITHROMAX) 600 MG tablet Take 2 tablets (1,200 mg total) by mouth once a week. On Mondays Patient not taking: Reported on 12/18/2014 10/20/14   Ginnie Smart, MD  elvitegravir-cobicistat-emtricitabine-tenofovir (GENVOYA) 150-150-200-10 MG TABS tablet Take 1 tablet by mouth daily with breakfast. Patient not taking: Reported on 12/18/2014 11/01/14   Randall Hiss, MD  Multiple Vitamin (MULTIVITAMIN) tablet Take 1 tablet by mouth daily.    Historical Provider, MD  sulfamethoxazole-trimethoprim (BACTRIM,SEPTRA) 400-80 MG per tablet Take 1 tablet by mouth daily. Patient not taking: Reported on 12/18/2014 10/20/14   Ginnie Smart, MD   BP 107/72 mmHg  Pulse 117  Temp(Src) 101.5 F (38.6 C) (Rectal)  Resp 16  Ht  (1.727 m)  Wt 126 lb (57.153 kg)  BMI 19.16 kg/m2  SpO2 85% Physical Exam  Constitutional: He appears well-developed.  Laying in bed. Frail and chronically ill appearing.  HENT:  Head: Normocephalic and atraumatic.  Eyes: Right eye exhibits no discharge. Left eye exhibits no discharge.  conjunctival injection L eye  Neck: Neck supple.  Cardiovascular: Regular rhythm and normal heart sounds.  Exam reveals no gallop and no friction rub.   No murmur heard. Tachycardic  Pulmonary/Chest: Effort normal and breath sounds normal. No respiratory distress.  Abdominal: Soft. He exhibits no distension. There is tenderness. There is no rebound.  Tenderness in the right lower quadrant without rebound or guarding. No distention.  Musculoskeletal: He exhibits no edema or tenderness.  Neurological: He is alert.  Skin: Skin is warm and dry.  Psychiatric: His behavior is normal. Thought content normal.  Nursing note and vitals reviewed.   ED Course  Procedures (including critical care time)  CRITICAL CARE Performed by: Raeford Razor   Total critical care time: 35 minutes  Critical  care time was exclusive of separately billable procedures and treating other patients. Critical care was necessary to treat or prevent imminent or life-threatening deterioration. Critical care was time spent personally by me on the following activities: development of treatment plan with patient and/or surrogate as well as nursing, discussions with consultants, evaluation of patient's response to treatment, examination of patient, obtaining history from patient or surrogate, ordering and performing treatments and interventions, ordering and review of laboratory studies, ordering and review of radiographic studies, pulse oximetry and re-evaluation of patient's condition.   Labs Review Labs Reviewed  LIPASE, BLOOD - Abnormal; Notable for the following:    Lipase 21 (*)    All other components within normal limits  COMPREHENSIVE METABOLIC PANEL - Abnormal; Notable for the following:    Sodium 129 (*)    Chloride 93 (*)    CO2 20 (*)    Glucose, Bld 122 (*)    Albumin 3.3 (*)    Anion gap 16 (*)    All other components within normal limits  CBC - Abnormal; Notable for the following:    WBC 10.7 (*)    RBC 5.94 (*)    MCV 71.0 (*)    MCH 24.6 (*)    All other components within normal limits  URINALYSIS, ROUTINE W REFLEX MICROSCOPIC (NOT AT [facility]) - Abnormal; Notable for the following:    Color, Urine AMBER (*)    APPearance CLOUDY (*)    Bilirubin Urine MODERATE (*)    Ketones, ur 40 (*)    Protein, ur 30 (*)    All other components within normal limits  CULTURE, BLOOD (ROUTINE X 2)  CULTURE, BLOOD (ROUTINE X 2)  CULTURE, EXPECTORATED SPUTUM-ASSESSMENT  URINE MICROSCOPIC-ADD ON  LACTIC ACID, PLASMA  LACTIC ACID, PLASMA  LACTATE DEHYDROGENASE    Imaging Review Dg Chest 2 View  03/15/2015   CLINICAL DATA:  Productive cough for 2 weeks  EXAM: CHEST - 2 VIEW  COMPARISON:  10/10/2014  FINDINGS: Cardiac shadow is within normal limits. Bibasilar infiltrates are noted right greater than  left in the lower lobes. No sizable effusion is noted. No acute bony abnormality is seen. Bilateral nipple shadows are noted.  IMPRESSION: Bilateral lower lobe infiltrates right greater than left.   Electronically Signed   By: Alcide Clever M.D.   On: 03/15/2015 21:52   I have personally reviewed and evaluated these images and lab results as part of my medical decision-making.   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 Febrile and hypoxic on arrival. He does endorse shortness of breath and productive cough. He generally does not appear well. Patient has known HIV. Noncompliant with medications. Last CD4 count was 10. IV place. Empirically covered with trimethoprim/sulfamethoxazole. Penicillin allergy and also given Levaquin. Will obtain chest x-ray, labs, urinalysis and cultures. Significant tenderness on abdominal exam. Will CT abdomen and pelvis. Needs admission.    o2 sats improved after neb. ~90-92% on 4L [place]. CXR does show b/l infiltrates. Abx infusing. Metabolic acidosis. Lactic acid pending. Ketones on UA. Reports anorexia. May be some component  of starvation keto acidosis. Has remained normotensive. Will keep IV running at rate after bolus. Repeat abdominal exam actually seems better than initial. Does continue to have some tenderness in RLQ though. CT pending, but have lower suspicion for possible acute surgical process. Will discuss with medicine for admission.   Raeford Razor, MD 03/16/15 [phone]

## 2015-03-16 DIAGNOSIS — J9601 Acute respiratory failure with hypoxia: Secondary | ICD-10-CM

## 2015-03-16 DIAGNOSIS — J9600 Acute respiratory failure, unspecified whether with hypoxia or hypercapnia: Secondary | ICD-10-CM | POA: Diagnosis present

## 2015-03-16 DIAGNOSIS — J189 Pneumonia, unspecified organism: Secondary | ICD-10-CM

## 2015-03-16 LAB — BASIC METABOLIC PANEL
Anion gap: 9 (ref 5–15)
BUN: 8 mg/dL (ref 6–20)
CHLORIDE: 105 mmol/L (ref 101–111)
CO2: 19 mmol/L — ABNORMAL LOW (ref 22–32)
CREATININE: 0.7 mg/dL (ref 0.61–1.24)
Calcium: 8.5 mg/dL — ABNORMAL LOW (ref 8.9–10.3)
Glucose, Bld: 114 mg/dL — ABNORMAL HIGH (ref 65–99)
POTASSIUM: 3.5 mmol/L (ref 3.5–5.1)
SODIUM: 133 mmol/L — AB (ref 135–145)

## 2015-03-16 LAB — TSH: TSH: 1.317 u[IU]/mL (ref 0.350–4.500)

## 2015-03-16 LAB — LACTIC ACID, PLASMA: Lactic Acid, Venous: 1.5 mmol/L (ref 0.5–2.0)

## 2015-03-16 LAB — I-STAT ARTERIAL BLOOD GAS, ED
Acid-base deficit: 2 mmol/L (ref 0.0–2.0)
Bicarbonate: 21.1 mEq/L (ref 20.0–24.0)
O2 Saturation: 97 %
Patient temperature: 98.6
TCO2: 22 mmol/L (ref 0–100)
pCO2 arterial: 30 mmHg — ABNORMAL LOW (ref 35.0–45.0)
pH, Arterial: 7.455 — ABNORMAL HIGH (ref 7.350–7.450)
pO2, Arterial: 80 mmHg (ref 80.0–100.0)

## 2015-03-16 LAB — CREATININE, SERUM
Creatinine, Ser: 0.67 mg/dL (ref 0.61–1.24)
GFR calc Af Amer: >60 mL/min (ref >60)
GFR calc non Af Amer: >60 mL/min (ref >60)

## 2015-03-16 LAB — CBC
HEMATOCRIT: 34.4 % — AB (ref 39.0–52.0)
Hemoglobin: 11.7 g/dL — ABNORMAL LOW (ref 13.0–17.0)
MCH: 23.9 pg — ABNORMAL LOW (ref 26.0–34.0)
MCHC: 34 g/dL (ref 30.0–36.0)
MCV: 70.3 fL — AB (ref 78.0–100.0)
Platelets: 293 10*3/uL (ref 150–400)
RBC: 4.89 MIL/uL (ref 4.22–5.81)
RDW: 14.8 % (ref 11.5–15.5)
WBC: 7.3 10*3/uL (ref 4.0–10.5)

## 2015-03-16 LAB — T-HELPER CELLS (CD4) COUNT (NOT AT ARMC)
CD4 % Helper T Cell: <1 % — ABNORMAL LOW (ref 33–55)
CD4 T Cell Abs: 10 /uL — ABNORMAL LOW (ref 400–2700)

## 2015-03-16 LAB — MRSA PCR SCREENING: MRSA BY PCR: NEGATIVE

## 2015-03-16 LAB — LACTATE DEHYDROGENASE: LDH: 127 U/L (ref 98–192)

## 2015-03-16 MED ORDER — ACETAMINOPHEN 650 MG RE SUPP: 650.0000 mg | Freq: Four times a day (QID) | RECTAL | Status: DC | PRN

## 2015-03-16 MED ORDER — SODIUM CHLORIDE 0.9 % IJ SOLN
3.0000 mL | Freq: Two times a day (BID) | INTRAMUSCULAR | Status: DC
  Administered 2015-03-16 – 2015-03-23 (×13): 3 mL via INTRAVENOUS

## 2015-03-16 MED ORDER — PREDNISONE 20 MG PO TABS
40.0000 mg | ORAL_TABLET | Freq: Two times a day (BID) | ORAL | Status: DC
  Administered 2015-03-16 – 2015-03-22 (×13): 40 mg via ORAL
  Filled 2015-03-16 (×14): qty 2

## 2015-03-16 MED ORDER — VITAMIN B-1 100 MG PO TABS
100.0000 mg | ORAL_TABLET | Freq: Every day | ORAL | Status: DC
  Administered 2015-03-16 – 2015-03-23 (×8): 100 mg via ORAL
  Filled 2015-03-16 (×8): qty 1

## 2015-03-16 MED ORDER — HEPARIN SODIUM (PORCINE) 5000 UNIT/ML IJ SOLN
5000.0000 [IU] | Freq: Three times a day (TID) | INTRAMUSCULAR | Status: DC
  Administered 2015-03-16 – 2015-03-23 (×22): 5000 [IU] via SUBCUTANEOUS
  Filled 2015-03-16 (×23): qty 1

## 2015-03-16 MED ORDER — ACETAMINOPHEN 325 MG PO TABS
650.0000 mg | ORAL_TABLET | Freq: Four times a day (QID) | ORAL | Status: DC | PRN
  Administered 2015-03-16 – 2015-03-20 (×2): 650 mg via ORAL
  Filled 2015-03-16 (×3): qty 2

## 2015-03-16 MED ORDER — ADULT MULTIVITAMIN W/MINERALS CH
1.0000 | ORAL_TABLET | Freq: Every day | ORAL | Status: DC
  Administered 2015-03-16 – 2015-03-23 (×8): 1 via ORAL
  Filled 2015-03-16 (×8): qty 1

## 2015-03-16 MED ORDER — SODIUM CHLORIDE 0.9 % IV SOLN
INTRAVENOUS | Status: DC
  Administered 2015-03-16 – 2015-03-18 (×5): via INTRAVENOUS
  Administered 2015-03-19: 75 mL/h via INTRAVENOUS
  Administered 2015-03-21: 05:00:00 via INTRAVENOUS

## 2015-03-16 MED ORDER — FOLIC ACID 1 MG PO TABS
1.0000 mg | ORAL_TABLET | Freq: Every day | ORAL | Status: DC
  Administered 2015-03-16 – 2015-03-23 (×8): 1 mg via ORAL
  Filled 2015-03-16 (×8): qty 1

## 2015-03-16 MED ORDER — LEVOFLOXACIN IN D5W 750 MG/150ML IV SOLN
750.0000 mg | INTRAVENOUS | Status: DC
  Filled 2015-03-16: qty 150

## 2015-03-16 MED ORDER — LEVOFLOXACIN IN D5W 750 MG/150ML IV SOLN
750.0000 mg | INTRAVENOUS | Status: AC
  Administered 2015-03-16 – 2015-03-20 (×5): 750 mg via INTRAVENOUS
  Filled 2015-03-16 (×6): qty 150

## 2015-03-16 MED ORDER — ELVITEG-COBIC-EMTRICIT-TENOFAF 150-150-200-10 MG PO TABS
1.0000 | ORAL_TABLET | Freq: Every day | ORAL | Status: DC
  Administered 2015-03-16 – 2015-03-23 (×8): 1 via ORAL
  Filled 2015-03-16 (×10): qty 1

## 2015-03-16 MED ORDER — SULFAMETHOXAZOLE-TRIMETHOPRIM 400-80 MG/5ML IV SOLN
290.0000 mg | Freq: Three times a day (TID) | INTRAVENOUS | Status: DC
  Administered 2015-03-16 – 2015-03-23 (×21): 290 mg via INTRAVENOUS
  Filled 2015-03-16 (×39): qty 18.1

## 2015-03-16 NOTE — ED Notes (Signed)
Meal Tray ordered.  

## 2015-03-16 NOTE — ED Notes (Signed)
Awaiting delivery of bactrim from pharmacy.

## 2015-03-16 NOTE — Progress Notes (Addendum)
[facility] Progress Note   Jon Love  ZOX:[id number]  DOB: 01-18-1976  DOA: 03/15/2015 PCP: No PCP Per Patient  Brief narrative: Jon Love is a 39 y.o. male with AIDS who presents to the hospital for shortness of breath and cough for 6 days. He also complains of mid abdominal pain and vomiting. No complaint of diarrhea. He admits that he has not been compliant with taking his HIV medications. In the ER a chest x-ray revealed bilateral infiltrates and he was noted to be hypoxic with oxygen saturations in the mid 80s.   Subjective: Continues to have a cough and mid abdominal pain. No diarrhea. Cough is productive of yellow colored sputum. No shortness of breath.  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 (HCC) -Resume anti-retrovirals -ID consult also requested   Code Status:     Code Status Orders        Start     Ordered   03/16/15 0212  Full code   Continuous     03/16/15 0211     Family Communication:  Disposition Plan: Continue antibiotics DVT prophylaxis: Heparin Consultants: Infectious disease Procedures:  Antibiotics: Anti-infectives    Start     Dose/Rate Route Frequency Ordered Stop   03/16/15 2300  levofloxacin (LEVAQUIN) IVPB 750 mg     750 mg 100 mL/hr over 90 Minutes Intravenous Every 24 hours 03/16/15 0211 03/21/15 2259   03/16/15 0800  elvitegravir-cobicistat-emtricitabine-tenofovir (GENVOYA) 150-150-200-10 MG tablet 1 tablet     1 tablet Oral Daily with breakfast 03/16/15 0211     03/16/15 0800   sulfamethoxazole-trimethoprim (BACTRIM) 290 mg in dextrose 5 % 250 mL IVPB    Comments:  ~5mg /kg IV Q8H   290 mg 268.1 mL/hr over 60 Minutes Intravenous Every 8 hours 03/16/15 0211     03/15/15 2200  vancomycin (VANCOCIN) IVPB 1000 mg/200 mL premix  Status:  Discontinued     1,000 mg 200 mL/hr over 60 Minutes Intravenous  Once 03/15/15 2155 03/16/15 0211   03/15/15 2145  sulfamethoxazole-trimethoprim (BACTRIM) 320 mg in dextrose 5 % 500 mL IVPB     320 mg 346.7 mL/hr over 90 Minutes Intravenous  Once 03/15/15 2134 03/16/15 0134   03/15/15 2145  levofloxacin (LEVAQUIN) IVPB 750 mg     750 mg 100 mL/hr over 90 Minutes Intravenous  Once 03/15/15 2134 03/16/15 0134      Objective: Filed Weights   03/15/15 2048  Weight: 57.153 kg (126 lb)   No intake or output data in the 24 hours ending 03/16/15 1348   Vitals Filed Vitals:   03/16/15 0500 03/16/15 0900 03/16/15 1000 03/16/15 1100  BP: 103/66 103/62 98/59 100/59  Pulse: 87 72 76 94  Temp:      TempSrc:      Resp: Height:      Weight:      SpO2: 95% 92% 92% 90%    Exam:  General:  Pt is alert, not in acute distress  HEENT: No icterus, No thrush, oral mucosa moist  Cardiovascular: regular rate and rhythm, S1/S2 No murmur  Respiratory: Crackles bilaterally at bases  Abdomen: Soft, +Bowel sounds, non tender, non distended, no  guarding  MSK: No LE edema, cyanosis or clubbing  Data Reviewed: Basic Metabolic Panel:  Recent Labs Lab 03/15/15 2054 03/16/15 0346 03/16/15 0858  NA 129*  --  133*  K 3.7  --  3.5  CL 93*  --  105  CO2 20*  --  19*  GLUCOSE 122*  --  114*  BUN 11  --  8  CREATININE 1.02 0.67 0.70  CALCIUM 9.2  --  8.5*   Liver Function Tests:  Recent Labs Lab 03/15/15 2054  AST 22  ALT 22  ALKPHOS 93  BILITOT 1.2  PROT 7.3  ALBUMIN 3.3*    Recent Labs Lab 03/15/15 2054  LIPASE 21*   No results for input(s): AMMONIA in the last 168 hours. CBC:  Recent Labs Lab  03/15/15 2054 03/16/15 0346  WBC 10.7* 7.3  HGB 14.6 11.7*  HCT 42.2 34.4*  MCV 71.0* 70.3*  PLT 368 293   Cardiac Enzymes: No results for input(s): CKTOTAL, CKMB, CKMBINDEX, TROPONINI in the last 168 hours. BNP (last 3 results) No results for input(s): BNP in the last 8760 hours.  ProBNP (last 3 results) No results for input(s): PROBNP in the last 8760 hours.  CBG: No results for input(s): GLUCAP in the last 168 hours.  No results found for this or any previous visit (from the past 240 hour(s)).   Studies: Dg Chest 2 View  03/15/2015   CLINICAL DATA:  Productive cough for 2 weeks  EXAM: CHEST - 2 VIEW  COMPARISON:  10/10/2014  FINDINGS: Cardiac shadow is within normal limits. Bibasilar infiltrates are noted right greater than left in the lower lobes. No sizable effusion is noted. No acute bony abnormality is seen. Bilateral nipple shadows are noted.  IMPRESSION: Bilateral lower lobe infiltrates right greater than left.   Electronically Signed   By: Alcide Clever M.D.   On: 03/15/2015 21:52   Ct Abdomen Pelvis W Contrast  03/16/2015   CLINICAL DATA:  Acute onset of diffuse mid abdominal pain, extending to the right. Current history of HIV. Initial encounter.  EXAM: CT ABDOMEN AND PELVIS WITH CONTRAST  TECHNIQUE: Multidetector CT imaging of the abdomen and pelvis was performed using the standard protocol following bolus administration of intravenous contrast.  CONTRAST:  OMNIPAQUE IOHEXOL 300 MG/ML  SOLN  COMPARISON:  CT of the abdomen and pelvis performed 10/06/2014, and MRI/MRA of the chest, abdomen and pelvis performed 10/07/2014  FINDINGS: Patchy bibasilar airspace opacities are noted, with underlying bronchiectasis. The appearance is suspicious for atypical infection.  Mild wall thickening is noted at the distal esophagus, likely reflecting chronic inflammation.  The liver is unremarkable in appearance. The spleen is mildly enlarged, measuring 14.1 cm in length. The patient is  status post cholecystectomy, with clips noted along the gallbladder fossa. The pancreas and adrenal glands are unremarkable.  The kidneys are unremarkable in appearance. There is no evidence of hydronephrosis. No renal or ureteral stones are seen. No perinephric stranding is appreciated.  No free fluid is identified. The small bowel is unremarkable in appearance. The stomach is within normal limits. No acute vascular abnormalities are seen.  The appendix contains trace residual contrast. It is grossly unremarkable in appearance. The colon is partially filled with fluid. There is slightly increased colonic wall enhancement along much of its length, raising question for a mild infectious or inflammatory process.  The bladder is mildly distended and grossly unremarkable. The prostate remains normal in size. No inguinal lymphadenopathy is seen.  No  acute osseous abnormalities are identified.  IMPRESSION: 1. Patchy bibasilar airspace opacities, with underlying bronchiectasis. The appearance is suspicious for atypical lung infection. 2. Colon partially filled with fluid, with slightly increased colonic wall enhancement along much of its length, raising question for a mild infectious or inflammatory process. 3. Mild wall thickening at the distal esophagus, likely reflecting chronic inflammation. 4. Mild splenomegaly.   Electronically Signed   By: Roanna Raider M.D.   On: 03/16/2015 00:29    Scheduled Meds:  Scheduled Meds: . elvitegravir-cobicistat-emtricitabine-tenofovir  1 tablet Oral Q breakfast  . folic acid  1 mg Oral Daily  . heparin  5,000 Units Subcutaneous 3 times per day  . multivitamin with minerals  1 tablet Oral Daily  . predniSONE  40 mg Oral BID WC  . sodium chloride  3 mL Intravenous Q12H  . thiamine  100 mg Oral Daily   Continuous Infusions: . sodium chloride 75 mL/hr at 03/16/15 0235  . levofloxacin (LEVAQUIN) IV    . sulfamethoxazole-trimethoprim 290 mg (03/16/15 0845)    Time spent  on care of this patient: 35 min   Kaylin Schellenberg, MD 03/16/2015, 1:48 PM  LOS: 1 day   [facility] Office  [phone] Pager - Text Page per [email or web address] If 7PM-7AM, please contact night-coverage [email or web address]

## 2015-03-17 ENCOUNTER — Inpatient Hospital Stay (HOSPITAL_COMMUNITY): Payer: Self-pay

## 2015-03-17 DIAGNOSIS — Z9119 Patient's noncompliance with other medical treatment and regimen: Secondary | ICD-10-CM

## 2015-03-17 DIAGNOSIS — R630 Anorexia: Secondary | ICD-10-CM

## 2015-03-17 DIAGNOSIS — J13 Pneumonia due to Streptococcus pneumoniae: Secondary | ICD-10-CM

## 2015-03-17 DIAGNOSIS — J154 Pneumonia due to other streptococci: Secondary | ICD-10-CM | POA: Diagnosis present

## 2015-03-17 DIAGNOSIS — R0902 Hypoxemia: Secondary | ICD-10-CM

## 2015-03-17 DIAGNOSIS — B20 Human immunodeficiency virus [HIV] disease: Principal | ICD-10-CM

## 2015-03-17 DIAGNOSIS — R1310 Dysphagia, unspecified: Secondary | ICD-10-CM

## 2015-03-17 LAB — EXPECTORATED SPUTUM ASSESSMENT W GRAM STAIN, RFLX TO RESP C

## 2015-03-17 LAB — HIV ANTIBODY (ROUTINE TESTING W REFLEX)

## 2015-03-17 LAB — HIV 1/2 AB DIFFERENTIATION
HIV 1 Ab: POSITIVE — AB
HIV 2 AB: NEGATIVE

## 2015-03-17 LAB — BASIC METABOLIC PANEL
ANION GAP: 10 (ref 5–15)
BUN: 7 mg/dL (ref 6–20)
CALCIUM: 8.6 mg/dL — AB (ref 8.9–10.3)
CO2: 20 mmol/L — ABNORMAL LOW (ref 22–32)
Chloride: 99 mmol/L — ABNORMAL LOW (ref 101–111)
Creatinine, Ser: 0.72 mg/dL (ref 0.61–1.24)
GFR calc Af Amer: >60 mL/min (ref >60)
GLUCOSE: 152 mg/dL — AB (ref 65–99)
POTASSIUM: 3.3 mmol/L — AB (ref 3.5–5.1)
SODIUM: 129 mmol/L — AB (ref 135–145)

## 2015-03-17 LAB — STREP PNEUMONIAE URINARY ANTIGEN: Strep Pneumo Urinary Antigen: POSITIVE — AB

## 2015-03-17 LAB — HEMOGLOBIN A1C
HEMOGLOBIN A1C: 6.3 % — AB (ref 4.8–5.6)
Mean Plasma Glucose: 134 mg/dL

## 2015-03-17 LAB — CBC
HEMATOCRIT: 35.3 % — AB (ref 39.0–52.0)
HEMOGLOBIN: 12.6 g/dL — AB (ref 13.0–17.0)
MCH: 24.7 pg — ABNORMAL LOW (ref 26.0–34.0)
MCHC: 35.7 g/dL (ref 30.0–36.0)
MCV: 69.2 fL — ABNORMAL LOW (ref 78.0–100.0)
Platelets: 371 10*3/uL (ref 150–400)
RBC: 5.1 MIL/uL (ref 4.22–5.81)
RDW: 14.8 % (ref 11.5–15.5)
WBC: 12.4 10*3/uL — AB (ref 4.0–10.5)

## 2015-03-17 LAB — GLUCOSE, CAPILLARY: GLUCOSE-CAPILLARY: 144 mg/dL — AB (ref 65–99)

## 2015-03-17 LAB — EXPECTORATED SPUTUM ASSESSMENT W REFEX TO RESP CULTURE

## 2015-03-17 MED ORDER — MENTHOL 3 MG MT LOZG
1.0000 | LOZENGE | OROMUCOSAL | Status: AC | PRN
  Administered 2015-03-17 – 2015-03-18 (×3): 3 mg via ORAL
  Filled 2015-03-17 (×2): qty 9

## 2015-03-17 MED ORDER — FLUCONAZOLE IN SODIUM CHLORIDE 200-0.9 MG/100ML-% IV SOLN
200.0000 mg | INTRAVENOUS | Status: DC
  Administered 2015-03-17 – 2015-03-19 (×3): 200 mg via INTRAVENOUS
  Filled 2015-03-17 (×4): qty 100

## 2015-03-17 MED ORDER — INFLUENZA VAC SPLIT QUAD 0.5 ML IM SUSY
0.5000 mL | PREFILLED_SYRINGE | INTRAMUSCULAR | Status: AC
  Administered 2015-03-19: 0.5 mL via INTRAMUSCULAR
  Filled 2015-03-17 (×2): qty 0.5

## 2015-03-17 MED ORDER — POTASSIUM CHLORIDE CRYS ER 20 MEQ PO TBCR
40.0000 meq | EXTENDED_RELEASE_TABLET | ORAL | Status: AC
  Administered 2015-03-17 (×2): 40 meq via ORAL
  Filled 2015-03-17 (×2): qty 2

## 2015-03-17 MED ORDER — ENSURE ENLIVE PO LIQD
237.0000 mL | Freq: Three times a day (TID) | ORAL | Status: DC
  Administered 2015-03-17 – 2015-03-19 (×4): 237 mL via ORAL

## 2015-03-17 MED ORDER — CETYLPYRIDINIUM CHLORIDE 0.05 % MT LIQD
7.0000 mL | Freq: Two times a day (BID) | OROMUCOSAL | Status: DC
  Administered 2015-03-17 – 2015-03-22 (×8): 7 mL via OROMUCOSAL

## 2015-03-17 NOTE — Progress Notes (Signed)
RN called d/t pt w/ sat 88% on 6 lpm [place].  Pt placed on 55% VM, no distress noted, no increased WOB noted.  [facility] w/ crackles in right lung.  RN notified. Sat 91-92% on VM now.  VSS.

## 2015-03-17 NOTE — Progress Notes (Signed)
[facility] Progress Note   Jon Love  ZOX:[id number]  DOB: 1975-10-09  DOA: 03/15/2015 PCP: No PCP Per Patient  Brief narrative: Jon Love is a 39 y.o. male with AIDS who presents to the hospital for shortness of breath and cough for 6 days. He also complains of mid abdominal pain and vomiting. No complaint of diarrhea. He admits that he has not been compliant with taking his HIV medications. In the ER a chest x-ray revealed bilateral infiltrates and he was noted to be hypoxic with oxygen saturations in the mid 80s.   Subjective: Continues to have a cough which is productive of a large amount of yellowish colored sputum. Continues to have mild mid abdominal pain. No nausea or vomiting. Able to tolerate solid food. No diarrhea. No shortness of breath at rest or with exertion. Able to ambulate to the bathroom  Assessment/Plan: Principal Problem:   Bilateral cryptococcal Pneumonia/acute resp failure - -strep antigen positive - continue levofloxacin -High risk for PCP pneumonia-started on Bactrim and prednisone -LDH mildly elevated-obtain pneumocystitis smear which has not yet been sent despite the fact that the patient states he is coughing up mucus-I have explained to the patient that we need a sputum specimen and he understands. -Legionella antigens pending -Has increased needs for oxygen and is now on 6 L but continues to desaturate to the mid 80s when he ambulates-now on a Venturi mask -will repeat chest x-ray -Continue to follow closely in the stepdown unit  Active Problems:   Hyponatremia -Possibly due to vomiting, dehydration-improving steadily -continue slow IV fluids- f/u CXR to ensure he is not developing pulm edema- no h/o CHF    AIDS (acquired immunodeficiency syndrome), CD4 <=200 (HCC) -CD4 count is 10 -Resume anti-retrovirals -ID consult also requested  Diabetes mellitus -Hemoglobin A1c mildly elevated at 6.3-will provide teaching regarding  diabetes including a diabetic diet   Code Status:     Code Status Orders        Start     Ordered   03/16/15 0212  Full code   Continuous     03/16/15 0211     Family Communication:  Disposition Plan: Continue antibiotics DVT prophylaxis: Heparin Consultants: Infectious disease Procedures:  Antibiotics: Anti-infectives    Start     Dose/Rate Route Frequency Ordered Stop   03/16/15 2300  levofloxacin (LEVAQUIN) IVPB 750 mg  Status:  Discontinued     750 mg 100 mL/hr over 90 Minutes Intravenous Every 24 hours 03/16/15 0211 03/16/15 1622   03/16/15 2200  levofloxacin (LEVAQUIN) IVPB 750 mg     750 mg 100 mL/hr over 90 Minutes Intravenous Every 24 hours 03/16/15 1622 03/21/15 2159   03/16/15 0800  elvitegravir-cobicistat-emtricitabine-tenofovir (GENVOYA) 150-150-200-10 MG tablet 1 tablet     1 tablet Oral Daily with breakfast 03/16/15 0211     03/16/15 0800  sulfamethoxazole-trimethoprim (BACTRIM) 290 mg in dextrose 5 % 250 mL IVPB    Comments:  ~5mg /kg IV Q8H   290 mg 268.1 mL/hr over 60 Minutes Intravenous Every 8 hours 03/16/15 0211     03/15/15 2200  vancomycin (VANCOCIN) IVPB 1000 mg/200 mL premix  Status:  Discontinued     1,000 mg 200 mL/hr over 60 Minutes Intravenous  Once 03/15/15 2155 03/16/15 0211   03/15/15 2145  sulfamethoxazole-trimethoprim (BACTRIM) 320 mg in dextrose 5 % 500 mL IVPB     320 mg 346.7 mL/hr over 90 Minutes Intravenous  Once 03/15/15 2134 03/16/15 0134   03/15/15 2145  levofloxacin (LEVAQUIN) IVPB 750  mg     750 mg 100 mL/hr over 90 Minutes Intravenous  Once 03/15/15 2134 03/16/15 0134      Objective: Filed Weights   03/15/15 2048 03/16/15 1619 03/17/15 0542  Weight: 57.153 kg (126 lb) 57.3 kg (126 lb 5.2 oz) 57.063 kg (125 lb 12.8 oz)    Intake/Output Summary (Last 24 hours) at 03/17/15 1219 Last data filed at 03/17/15 0931  Gross per 24 hour  Intake 2082.5 ml  Output   1550 ml  Net  532.5 ml     Vitals Filed Vitals:    03/17/15 0600 03/17/15 0758 03/17/15 1141 03/17/15 1202  BP: 108/71 101/63 118/68   Pulse: 71 72 88 78  Temp:  97.7 F (36.5 C) 97.5 F (36.4 C)   TempSrc:  Oral Oral   Resp: 19 20 27 18   Height:      Weight:      SpO2:  92% 90% 91%    Exam:  General:  Pt is alert, not in acute distress  HEENT: No icterus, No thrush, oral mucosa moist  Cardiovascular: regular rate and rhythm, S1/S2 No murmur  Respiratory: Crackles bilaterally at bases and in mid lung fields today  Abdomen: Soft, +Bowel sounds, non tender, non distended, no guarding  MSK: No LE edema, cyanosis or clubbing  Data Reviewed: Basic Metabolic Panel:  Recent Labs Lab 03/15/15 2054 03/16/15 0346 03/16/15 0858  NA 129*  --  133*  K 3.7  --  3.5  CL 93*  --  105  CO2 20*  --  19*  GLUCOSE 122*  --  114*  BUN 11  --  8  CREATININE 1.02 0.67 0.70  CALCIUM 9.2  --  8.5*   Liver Function Tests:  Recent Labs Lab 03/15/15 2054  AST 22  ALT 22  ALKPHOS 93  BILITOT 1.2  PROT 7.3  ALBUMIN 3.3*    Recent Labs Lab 03/15/15 2054  LIPASE 21*   No results for input(s): AMMONIA in the last 168 hours. CBC:  Recent Labs Lab 03/15/15 2054 03/16/15 0346  WBC 10.7* 7.3  HGB 14.6 11.7*  HCT 42.2 34.4*  MCV 71.0* 70.3*  PLT 368 293   Cardiac Enzymes: No results for input(s): CKTOTAL, CKMB, CKMBINDEX, TROPONINI in the last 168 hours. BNP (last 3 results) No results for input(s): BNP in the last 8760 hours.  ProBNP (last 3 results) No results for input(s): PROBNP in the last 8760 hours.  CBG:  Recent Labs Lab 03/17/15 0758  GLUCAP 144*    Recent Results (from the past 240 hour(s))  Blood culture (routine x 2)     Status: None (Preliminary result)   Collection Time: 03/15/15 10:05 PM  Result Value Ref Range Status   Specimen Description BLOOD RIGHT ARM  Final   Special Requests BOTTLES DRAWN AEROBIC AND ANAEROBIC 5CC  Final   Culture NO GROWTH 1 DAY  Final   Report Status PENDING   Incomplete  Blood culture (routine x 2)     Status: None (Preliminary result)   Collection Time: 03/15/15 10:23 PM  Result Value Ref Range Status   Specimen Description BLOOD RIGHT FOREARM  Final   Special Requests BOTTLES DRAWN AEROBIC AND ANAEROBIC  Final   Culture NO GROWTH 1 DAY  Final   Report Status PENDING  Incomplete  MRSA PCR Screening     Status: None   Collection Time: 03/16/15  4:39 PM  Result Value Ref Range Status   MRSA by  PCR NEGATIVE NEGATIVE Final    Comment:        The GeneXpert MRSA Assay (FDA approved for NASAL specimens only), is one component of a comprehensive MRSA colonization surveillance program. It is not intended to diagnose MRSA infection nor to guide or monitor treatment for MRSA infections.      Studies: Dg Chest 2 View  03/15/2015   CLINICAL DATA:  Productive cough for 2 weeks  EXAM: CHEST - 2 VIEW  COMPARISON:  10/10/2014  FINDINGS: Cardiac shadow is within normal limits. Bibasilar infiltrates are noted right greater than left in the lower lobes. No sizable effusion is noted. No acute bony abnormality is seen. Bilateral nipple shadows are noted.  IMPRESSION: Bilateral lower lobe infiltrates right greater than left.   Electronically Signed   By: Alcide Clever M.D.   On: 03/15/2015 21:52   Ct Abdomen Pelvis W Contrast  03/16/2015   CLINICAL DATA:  Acute onset of diffuse mid abdominal pain, extending to the right. Current history of HIV. Initial encounter.  EXAM: CT ABDOMEN AND PELVIS WITH CONTRAST  TECHNIQUE: Multidetector CT imaging of the abdomen and pelvis was performed using the standard protocol following bolus administration of intravenous contrast.  CONTRAST:  OMNIPAQUE IOHEXOL 300 MG/ML  SOLN  COMPARISON:  CT of the abdomen and pelvis performed 10/06/2014, and MRI/MRA of the chest, abdomen and pelvis performed 10/07/2014  FINDINGS: Patchy bibasilar airspace opacities are noted, with underlying bronchiectasis. The appearance is suspicious  for atypical infection.  Mild wall thickening is noted at the distal esophagus, likely reflecting chronic inflammation.  The liver is unremarkable in appearance. The spleen is mildly enlarged, measuring 14.1 cm in length. The patient is status post cholecystectomy, with clips noted along the gallbladder fossa. The pancreas and adrenal glands are unremarkable.  The kidneys are unremarkable in appearance. There is no evidence of hydronephrosis. No renal or ureteral stones are seen. No perinephric stranding is appreciated.  No free fluid is identified. The small bowel is unremarkable in appearance. The stomach is within normal limits. No acute vascular abnormalities are seen.  The appendix contains trace residual contrast. It is grossly unremarkable in appearance. The colon is partially filled with fluid. There is slightly increased colonic wall enhancement along much of its length, raising question for a mild infectious or inflammatory process.  The bladder is mildly distended and grossly unremarkable. The prostate remains normal in size. No inguinal lymphadenopathy is seen.  No acute osseous abnormalities are identified.  IMPRESSION: 1. Patchy bibasilar airspace opacities, with underlying bronchiectasis. The appearance is suspicious for atypical lung infection. 2. Colon partially filled with fluid, with slightly increased colonic wall enhancement along much of its length, raising question for a mild infectious or inflammatory process. 3. Mild wall thickening at the distal esophagus, likely reflecting chronic inflammation. 4. Mild splenomegaly.   Electronically Signed   By: Roanna Raider M.D.   On: 03/16/2015 00:29    Scheduled Meds:  Scheduled Meds: . elvitegravir-cobicistat-emtricitabine-tenofovir  1 tablet Oral Q breakfast  . feeding supplement (ENSURE ENLIVE)  237 mL Oral TID  . folic acid  1 mg Oral Daily  . heparin  5,000 Units Subcutaneous 3 times per day  . levofloxacin (LEVAQUIN) IV  750 mg  Intravenous Q24H  . multivitamin with minerals  1 tablet Oral Daily  . predniSONE  40 mg Oral BID WC  . sodium chloride  3 mL Intravenous Q12H  . sulfamethoxazole-trimethoprim  290 mg Intravenous Q8H  . thiamine  100  mg Oral Daily   Continuous Infusions: . sodium chloride 75 mL/hr at 03/16/15 2045    Time spent on care of this patient: 35 min   Deona Novitski, MD 03/17/2015, 12:19 PM  LOS: 2 days   [facility] Office  [phone] Pager - Text Page per [email or web address] If 7PM-7AM, please contact night-coverage [email or web address]

## 2015-03-17 NOTE — Consult Note (Signed)
[facility]      Reason for Consult: AIDS, PCP pneumonia    Referring Physician: Dr. Butler Denmark  Principal Problem:   Streptococcal pneumonia (HCC) Active Problems:   Hyponatremia   AIDS (acquired immunodeficiency syndrome), CD4 <=200 (HCC)   Acute respiratory failure (HCC)   . elvitegravir-cobicistat-emtricitabine-tenofovir  1 tablet Oral Q breakfast  . feeding supplement (ENSURE ENLIVE)  237 mL Oral TID  . folic acid  1 mg Oral Daily  . heparin  5,000 Units Subcutaneous 3 times per day  . levofloxacin (LEVAQUIN) IV  750 mg Intravenous Q24H  . multivitamin with minerals  1 tablet Oral Daily  . potassium chloride  40 mEq Oral Q4H  . predniSONE  40 mg Oral BID WC  . sodium chloride  3 mL Intravenous Q12H  . sulfamethoxazole-trimethoprim  290 mg Intravenous Q8H  . thiamine  100 mg Oral Daily    Recommendations: Continue PCP treatment Continue CAP treatment I will add fluconazole for Candida esophagitis dysphagia Continue with Genvoya   Assessment: He most likely has PCP pneumonia based on severe hypoxia with activity and very poor immune system.  Also noted S pneumo Ag positive and may be concurrent. I discussed the severity of the illness and need for good compliance.     CT, CXR, lab history reviewed.   Antibiotics: levaquin genvoya Bactrim/steroids  HPI: Jon Love is a 39 y.o. male with HIV/AIDS diagnosed earlier this year after lap chole and was to start [facility] but was never able to get.  Did not follow up for ADAP and did not start.  Also has not been on OI prophyaxis. Came in 10/6 with SOB, SOB, productive cough with brown sputum, n/v, poor po.  He also has noted several weeks of dysphagia, poor po.  CD4 10.  Hypoxic and desated to 80s with minimal activity. CT chest with bibasilar airspace disease.  Does have a positive Strep pneumo Ag.  Suptum samples sent.     Review of Systems: A comprehensive review of systems was negative  except for: Gastrointestinal: positive for dysphagia  Past Medical History  Diagnosis Date  . HIV disease [facility])     Social History  Substance Use Topics  . Smoking status: Never Smoker   . Smokeless tobacco: Never Used  . Alcohol Use: 0.0 oz/week    0 Standard drinks or equivalent per week     Comment: beer    FHX: no lung disease  Allergies; pcn  OBJECTIVE: Blood pressure 106/68, pulse 78, temperature 97.5 F (36.4 C), temperature source Oral, resp. rate 18, height  (1.727 m), weight 125 lb 12.8 oz (57.063 kg), SpO2 91 %. General: awake, on venti mask HEENT: anicteric Skin: no rashes Lungs: diminished airway, no crackles Cor: tachy rr Abdomen: soft, nt, nd Ext: no edema Neuro: non-focal   Microbiology: Recent Results (from the past 240 hour(s))  Blood culture (routine x 2)     Status: None (Preliminary result)   Collection Time: 03/15/15 10:05 PM  Result Value Ref Range Status   Specimen Description BLOOD RIGHT ARM  Final   Special Requests BOTTLES DRAWN AEROBIC AND ANAEROBIC 5CC  Final   Culture NO GROWTH 1 DAY  Final   Report Status PENDING  Incomplete  Blood culture (routine x 2)     Status: None (Preliminary result)   Collection Time: 03/15/15 10:23 PM  Result Value Ref Range Status   Specimen Description BLOOD RIGHT FOREARM  Final   Special  Requests BOTTLES DRAWN AEROBIC AND ANAEROBIC  Final   Culture NO GROWTH 1 DAY  Final   Report Status PENDING  Incomplete  MRSA PCR Screening     Status: None   Collection Time: 03/16/15  4:39 PM  Result Value Ref Range Status   MRSA by PCR NEGATIVE NEGATIVE Final    Comment:        The GeneXpert MRSA Assay (FDA approved for NASAL specimens only), is one component of a comprehensive MRSA colonization surveillance program. It is not intended to diagnose MRSA infection nor to guide or monitor treatment for MRSA infections.     Staci Righter, MD [facility] [email or web address] [phone] pager  [phone] cell 03/17/2015, 2:52 PM

## 2015-03-18 DIAGNOSIS — B59 Pneumocystosis: Secondary | ICD-10-CM

## 2015-03-18 DIAGNOSIS — B370 Candidal stomatitis: Secondary | ICD-10-CM

## 2015-03-18 DIAGNOSIS — R64 Cachexia: Secondary | ICD-10-CM

## 2015-03-18 DIAGNOSIS — A419 Sepsis, unspecified organism: Secondary | ICD-10-CM

## 2015-03-18 DIAGNOSIS — R6520 Severe sepsis without septic shock: Secondary | ICD-10-CM

## 2015-03-18 LAB — BASIC METABOLIC PANEL
Anion gap: 7 (ref 5–15)
BUN: 6 mg/dL (ref 6–20)
CALCIUM: 8.6 mg/dL — AB (ref 8.9–10.3)
CHLORIDE: 102 mmol/L (ref 101–111)
CO2: 22 mmol/L (ref 22–32)
CREATININE: 0.67 mg/dL (ref 0.61–1.24)
Glucose, Bld: 119 mg/dL — ABNORMAL HIGH (ref 65–99)
Potassium: 4.2 mmol/L (ref 3.5–5.1)
SODIUM: 131 mmol/L — AB (ref 135–145)

## 2015-03-18 LAB — GLUCOSE, CAPILLARY
GLUCOSE-CAPILLARY: 139 mg/dL — AB (ref 65–99)
Glucose-Capillary: 138 mg/dL — ABNORMAL HIGH (ref 65–99)
Glucose-Capillary: 143 mg/dL — ABNORMAL HIGH (ref 65–99)

## 2015-03-18 LAB — CBC
HCT: 36 % — ABNORMAL LOW (ref 39.0–52.0)
Hemoglobin: 12.2 g/dL — ABNORMAL LOW (ref 13.0–17.0)
MCH: 23.8 pg — ABNORMAL LOW (ref 26.0–34.0)
MCHC: 33.9 g/dL (ref 30.0–36.0)
MCV: 70.3 fL — AB (ref 78.0–100.0)
PLATELETS: 350 10*3/uL (ref 150–400)
RBC: 5.12 MIL/uL (ref 4.22–5.81)
RDW: 15 % (ref 11.5–15.5)
WBC: 6.4 10*3/uL (ref 4.0–10.5)

## 2015-03-18 MED ORDER — INSULIN ASPART 100 UNIT/ML ~~LOC~~ SOLN
0.0000 [IU] | Freq: Three times a day (TID) | SUBCUTANEOUS | Status: DC
  Administered 2015-03-19: 1 [IU] via SUBCUTANEOUS
  Administered 2015-03-19: 2 [IU] via SUBCUTANEOUS
  Administered 2015-03-20 – 2015-03-22 (×5): 1 [IU] via SUBCUTANEOUS
  Administered 2015-03-23: 2 [IU] via SUBCUTANEOUS

## 2015-03-18 MED ORDER — INSULIN ASPART 100 UNIT/ML ~~LOC~~ SOLN: 0.0000 [IU] | Freq: Every day | SUBCUTANEOUS | Status: DC

## 2015-03-18 NOTE — Progress Notes (Signed)
[facility]   Date of Admission:  03/15/2015  Antibiotics: Bactrim/prednisone levaquin  Subjective: Continued sob, improved dysphagia  Objective: Temp:  [97.7 F (36.5 C)-98.1 F (36.7 C)] 98.1 F (36.7 C) (10/09 1127) Pulse Rate:  [65-76] 68 (10/09 1127) Resp:  [16-24] 20 (10/09 1127) BP: (97-140)/(61-75) 97/64 mmHg (10/09 1127) SpO2:  [91 %-100 %] 92 % (10/09 1127) FiO2 (%):  [55 %] 55 % (10/08 2333) Weight:  [125 lb 12.8 oz (57.063 kg)] 125 lb 12.8 oz (57.063 kg) (10/09 0500)  General: awake, alert, mild respiratory distress Skin: no rashes Lungs: CTA B, no wheezes Cor: Tachy RR Abdomen: soft, nt, nd Ext: no edema Neuro: non focal HEENT: anicteric  A comprehensive review of systems was negative except for: Constitutional: positive for malaise   Social History   Social History  . Marital Status: Single    Spouse Name: N/A  . Number of Children: N/A  . Years of Education: N/A   Occupational History  . Not on file.   Social History Main Topics  . Smoking status: Never Smoker   . Smokeless tobacco: Never Used  . Alcohol Use: 0.0 oz/week    0 Standard drinks or equivalent per week     Comment: beer  . Drug Use: No  . Sexual Activity: Not on file   Other Topics Concern  . Not on file   Social History Narrative    Lab Results Lab Results  Component Value Date   WBC 6.4 03/18/2015   HGB 12.2* 03/18/2015   HCT 36.0* 03/18/2015   MCV 70.3* 03/18/2015   PLT 350 03/18/2015    Lab Results  Component Value Date   CREATININE 0.67 03/18/2015   BUN 6 03/18/2015   NA 131* 03/18/2015   K 4.2 03/18/2015   CL 102 03/18/2015   CO2 22 03/18/2015    Lab Results  Component Value Date   ALT 22 03/15/2015   AST 22 03/15/2015   ALKPHOS 93 03/15/2015   BILITOT 1.2 03/15/2015      Microbiology: Recent Results (from the past 240 hour(s))  Blood culture (routine x 2)     Status: None (Preliminary result)   Collection Time:  03/15/15 10:05 PM  Result Value Ref Range Status   Specimen Description BLOOD RIGHT ARM  Final   Special Requests BOTTLES DRAWN AEROBIC AND ANAEROBIC 5CC  Final   Culture NO GROWTH 2 DAYS  Final   Report Status PENDING  Incomplete  Blood culture (routine x 2)     Status: None (Preliminary result)   Collection Time: 03/15/15 10:23 PM  Result Value Ref Range Status   Specimen Description BLOOD RIGHT FOREARM  Final   Special Requests BOTTLES DRAWN AEROBIC AND ANAEROBIC  Final   Culture NO GROWTH 2 DAYS  Final   Report Status PENDING  Incomplete  MRSA PCR Screening     Status: None   Collection Time: 03/16/15  4:39 PM  Result Value Ref Range Status   MRSA by PCR NEGATIVE NEGATIVE Final    Comment:        The GeneXpert MRSA Assay (FDA approved for NASAL specimens only), is one component of a comprehensive MRSA colonization surveillance program. It is not intended to diagnose MRSA infection nor to guide or monitor treatment for MRSA infections.   Culture, expectorated sputum-assessment     Status: None   Collection Time: 03/17/15  3:27 PM  Result Value Ref Range Status   Specimen  Description SPUTUM  Final   Special Requests Immunocompromised  Final   Sputum evaluation   Final    THIS SPECIMEN IS ACCEPTABLE. RESPIRATORY CULTURE REPORT TO FOLLOW.   Report Status 03/17/2015 FINAL  Final  AFB culture with smear     Status: None (Preliminary result)   Collection Time: 03/17/15  3:29 PM  Result Value Ref Range Status   Specimen Description SPUTUM  Final   Special Requests Immunocompromised  Final   Acid Fast Smear   Final    NO ACID FAST BACILLI SEEN Performed at [facility]    Culture   Final    CULTURE WILL BE EXAMINED FOR 6 WEEKS BEFORE ISSUING A FINAL REPORT Performed at [facility]    Report Status PENDING  Incomplete    Studies/Results: Dg Chest Port 1 View  03/17/2015   CLINICAL DATA:  Shortness of breath and cough, 6 days duration. HIV patient.   EXAM: PORTABLE CHEST 1 VIEW  COMPARISON:  03/15/2015  FINDINGS: Heart size is normal. Mediastinal shadows are normal. Patchy and reticular nodular shadowing throughout both lungs is improved since 2 days ago consistent with improving pneumonia. No area of dense consolidation or collapse. No effusions. Bony structures unremarkable.  IMPRESSION: Improvement in the appearance of patchy and reticulonodular infiltrate shadows throughout the lungs that were demonstrated 2 days ago. No worsening or new findings.   Electronically Signed   By: Paulina Fusi M.D.   On: 03/17/2015 12:56    Assessment/Plan:  1) PCP pneumonia - the significant hypoxia, particularly with activity, low CD4 count, dry cough noted, c/w PCP pneumonia. Smear pending.    2) HIV/AIDS - has been started on Genvoya.  Will need a supply at discharge to assure he can continue until he gets into clinic.   3) esophageal Candida - improved on fluconazole.   4) OI - will start azithromycin prophylaxis   COMER, ROBERT, MD [facility] [email or web address] [phone] pager   [phone] cell 03/18/2015, 12:48 PM

## 2015-03-18 NOTE — Progress Notes (Addendum)
[facility] Progress Note   Jon Love  ZOX:[id number]  DOB: 09/05/1975  DOA: 03/15/2015 PCP: No PCP Per Patient  Brief narrative: Jon Love is a 39 y.o. male with AIDS who presents to the hospital for shortness of breath and cough for 6 days. He also complains of mid abdominal pain and vomiting. No complaint of diarrhea. He admits that he has not been yet been able to obtain his HIV meds and therefore has not been taking any. In the ER a chest x-ray revealed bilateral infiltrates and he was noted to be hypoxic with oxygen saturations in the mid 80s.   Subjective: Continues to have cough with dyspnea on exertion. Having pain with swallowing and mild abdominal "burning". No c/o fever, chills, nausea, vomiting or diarrhea.   Assessment/Plan: Principal Problem:   Bilateral Pneumonia/acute resp failure - strep antigen positive - continue levofloxacin -High risk for PCP pneumonia-started on Bactrim and prednisone- PCP smear pending- CXR reveals reticulonodular infiltrates -Legionella antigen pending -currently requiring 5- 6 L of O2 -Continue to follow closely in the stepdown unit  Active Problems:   Hyponatremia -Possibly due to vomiting, dehydration-improving steadily -cont slow IVF     AIDS (acquired immunodeficiency syndrome), CD4 <=200 (HCC) - Per ID notes, the patient has not followed up with ID  -CD4 count is 10 -Resumed anti-retrovirals -ID consult also following - Zithromax added for prophylaxis   Dysphagia -no thrush noted but possibly has sophageal candida- cont fluconazole which has been started by ID  Diabetes mellitus -Hemoglobin A1c mildly elevated at 6.3-will provide teaching regarding diabetes including a diabetic diet   Code Status:     Code Status Orders        Start     Ordered   03/16/15 0212  Full code   Continuous     03/16/15 0211     Family Communication:  Disposition Plan: Continue antibiotics; cont to follow in  SDU DVT prophylaxis: Heparin Consultants: Infectious disease Procedures:  Antibiotics: Anti-infectives    Start     Dose/Rate Route Frequency Ordered Stop   03/17/15 1530  fluconazole (DIFLUCAN) IVPB 200 mg     200 mg 100 mL/hr over 60 Minutes Intravenous Every 24 hours 03/17/15 1504     03/16/15 2300  levofloxacin (LEVAQUIN) IVPB 750 mg  Status:  Discontinued     750 mg 100 mL/hr over 90 Minutes Intravenous Every 24 hours 03/16/15 0211 03/16/15 1622   03/16/15 2200  levofloxacin (LEVAQUIN) IVPB 750 mg     750 mg 100 mL/hr over 90 Minutes Intravenous Every 24 hours 03/16/15 1622 03/21/15 2159   03/16/15 0800  elvitegravir-cobicistat-emtricitabine-tenofovir (GENVOYA) 150-150-200-10 MG tablet 1 tablet     1 tablet Oral Daily with breakfast 03/16/15 0211     03/16/15 0800  sulfamethoxazole-trimethoprim (BACTRIM) 290 mg in dextrose 5 % 250 mL IVPB    Comments:  ~5mg /kg IV Q8H   290 mg 268.1 mL/hr over 60 Minutes Intravenous Every 8 hours 03/16/15 0211     03/15/15 2200  vancomycin (VANCOCIN) IVPB 1000 mg/200 mL premix  Status:  Discontinued     1,000 mg 200 mL/hr over 60 Minutes Intravenous  Once 03/15/15 2155 03/16/15 0211   03/15/15 2145  sulfamethoxazole-trimethoprim (BACTRIM) 320 mg in dextrose 5 % 500 mL IVPB     320 mg 346.7 mL/hr over 90 Minutes Intravenous  Once 03/15/15 2134 03/16/15 0134   03/15/15 2145  levofloxacin (LEVAQUIN) IVPB 750 mg     750 mg 100 mL/hr over 90  Minutes Intravenous  Once 03/15/15 2134 03/16/15 0134      Objective: Filed Weights   03/16/15 1619 03/17/15 0542 03/18/15 0500  Weight: 57.3 kg (126 lb 5.2 oz) 57.063 kg (125 lb 12.8 oz) 57.063 kg (125 lb 12.8 oz)    Intake/Output Summary (Last 24 hours) at 03/18/15 1322 Last data filed at 03/18/15 0800  Gross per 24 hour  Intake 2801.26 ml  Output   2100 ml  Net 701.26 ml     Vitals Filed Vitals:   03/18/15 0400 03/18/15 0500 03/18/15 0740 03/18/15 1127  BP: 140/64  114/75 97/64  Pulse:   65  68  Temp: 97.9 F (36.6 C)   98.1 F (36.7 C)  TempSrc: Oral  Oral Oral  Resp:   18 20  Height:      Weight:  57.063 kg (125 lb 12.8 oz)    SpO2:   99% 92%    Exam:  General:  Pt is alert, not in acute distress  HEENT: No icterus, No thrush, oral mucosa moist  Cardiovascular: regular rate and rhythm, S1/S2 No murmur  Respiratory: crackles bilaterally at bases and in mid lung fields    Abdomen: Soft, +Bowel sounds, non tender, non distended, no guarding  MSK: No LE edema, cyanosis or clubbing  Data Reviewed: Basic Metabolic Panel:  Recent Labs Lab 03/15/15 2054 03/16/15 0346 03/16/15 0858 03/17/15 1225 03/18/15 0443  NA 129*  --  133* 129* 131*  K 3.7  --  3.5 3.3* 4.2  CL 93*  --  105 99* 102  CO2 20*  --  19* 20* 22  GLUCOSE 122*  --  114* 152* 119*  BUN 11  --  CREATININE 1.02 0.67 0.70 0.72 0.67  CALCIUM 9.2  --  8.5* 8.6* 8.6*   Liver Function Tests:  Recent Labs Lab 03/15/15 2054  AST 22  ALT 22  ALKPHOS 93  BILITOT 1.2  PROT 7.3  ALBUMIN 3.3*    Recent Labs Lab 03/15/15 2054  LIPASE 21*   No results for input(s): AMMONIA in the last 168 hours. CBC:  Recent Labs Lab 03/15/15 2054 03/16/15 0346 03/17/15 1225 03/18/15 0443  WBC 10.7* 7.3 12.4* 6.4  HGB 14.6 11.7* 12.6* 12.2*  HCT 42.2 34.4* 35.3* 36.0*  MCV 71.0* 70.3* 69.2* 70.3*  PLT 368 293 371 350   Cardiac Enzymes: No results for input(s): CKTOTAL, CKMB, CKMBINDEX, TROPONINI in the last 168 hours. BNP (last 3 results) No results for input(s): BNP in the last 8760 hours.  ProBNP (last 3 results) No results for input(s): PROBNP in the last 8760 hours.  CBG:  Recent Labs Lab 03/17/15 0758 03/18/15 0742  GLUCAP 144* 138*    Recent Results (from the past 240 hour(s))  Blood culture (routine x 2)     Status: None (Preliminary result)   Collection Time: 03/15/15 10:05 PM  Result Value Ref Range Status   Specimen Description BLOOD RIGHT ARM  Final   Special  Requests BOTTLES DRAWN AEROBIC AND ANAEROBIC 5CC  Final   Culture NO GROWTH 2 DAYS  Final   Report Status PENDING  Incomplete  Blood culture (routine x 2)     Status: None (Preliminary result)   Collection Time: 03/15/15 10:23 PM  Result Value Ref Range Status   Specimen Description BLOOD RIGHT FOREARM  Final   Special Requests BOTTLES DRAWN AEROBIC AND ANAEROBIC  Final   Culture NO GROWTH 2 DAYS  Final  Report Status PENDING  Incomplete  MRSA PCR Screening     Status: None   Collection Time: 03/16/15  4:39 PM  Result Value Ref Range Status   MRSA by PCR NEGATIVE NEGATIVE Final    Comment:        The GeneXpert MRSA Assay (FDA approved for NASAL specimens only), is one component of a comprehensive MRSA colonization surveillance program. It is not intended to diagnose MRSA infection nor to guide or monitor treatment for MRSA infections.   Culture, expectorated sputum-assessment     Status: None   Collection Time: 03/17/15  3:27 PM  Result Value Ref Range Status   Specimen Description SPUTUM  Final   Special Requests Immunocompromised  Final   Sputum evaluation   Final    THIS SPECIMEN IS ACCEPTABLE. RESPIRATORY CULTURE REPORT TO FOLLOW.   Report Status 03/17/2015 FINAL  Final  AFB culture with smear     Status: None (Preliminary result)   Collection Time: 03/17/15  3:29 PM  Result Value Ref Range Status   Specimen Description SPUTUM  Final   Special Requests Immunocompromised  Final   Acid Fast Smear   Final    NO ACID FAST BACILLI SEEN Performed at [facility]    Culture   Final    CULTURE WILL BE EXAMINED FOR 6 WEEKS BEFORE ISSUING A FINAL REPORT Performed at [facility]    Report Status PENDING  Incomplete     Studies: Dg Chest Port 1 View  03/17/2015   CLINICAL DATA:  Shortness of breath and cough, 6 days duration. HIV patient.  EXAM: PORTABLE CHEST 1 VIEW  COMPARISON:  03/15/2015  FINDINGS: Heart size is normal. Mediastinal shadows are  normal. Patchy and reticular nodular shadowing throughout both lungs is improved since 2 days ago consistent with improving pneumonia. No area of dense consolidation or collapse. No effusions. Bony structures unremarkable.  IMPRESSION: Improvement in the appearance of patchy and reticulonodular infiltrate shadows throughout the lungs that were demonstrated 2 days ago. No worsening or new findings.   Electronically Signed   By: Paulina Fusi M.D.   On: 03/17/2015 12:56    Scheduled Meds:  Scheduled Meds: . antiseptic oral rinse  7 mL Mouth Rinse BID  . elvitegravir-cobicistat-emtricitabine-tenofovir  1 tablet Oral Q breakfast  . feeding supplement (ENSURE ENLIVE)  237 mL Oral TID  . fluconazole (DIFLUCAN) IV  200 mg Intravenous Q24H  . folic acid  1 mg Oral Daily  . heparin  5,000 Units Subcutaneous 3 times per day  . Influenza vac split quadrivalent PF  0.5 mL Intramuscular Tomorrow-1000  . levofloxacin (LEVAQUIN) IV  750 mg Intravenous Q24H  . multivitamin with minerals  1 tablet Oral Daily  . predniSONE  40 mg Oral BID WC  . sodium chloride  3 mL Intravenous Q12H  . sulfamethoxazole-trimethoprim  290 mg Intravenous Q8H  . thiamine  100 mg Oral Daily   Continuous Infusions: . sodium chloride 75 mL/hr at 03/18/15 0800    Time spent on care of this patient: 35 min   Sarahlynn Cisnero, MD 03/18/2015, 1:22 PM  LOS: 3 days   [facility] Office  [phone] Pager - Text Page per [email or web address] If 7PM-7AM, please contact night-coverage [email or web address]

## 2015-03-19 LAB — GLUCOSE, CAPILLARY
GLUCOSE-CAPILLARY: 135 mg/dL — AB (ref 65–99)
GLUCOSE-CAPILLARY: 116 mg/dL — AB (ref 65–99)
Glucose-Capillary: 170 mg/dL — ABNORMAL HIGH (ref 65–99)
Glucose-Capillary: 126 mg/dL — ABNORMAL HIGH (ref 65–99)

## 2015-03-19 LAB — HIV-1 RNA QUANT-NO REFLEX-BLD
HIV 1 RNA Quant: 211920 copies/mL
LOG10 HIV-1 RNA: 5.326 {Log_copies}/mL

## 2015-03-19 LAB — BASIC METABOLIC PANEL
ANION GAP: 10 (ref 5–15)
BUN: 9 mg/dL (ref 6–20)
CALCIUM: 8.9 mg/dL (ref 8.9–10.3)
CO2: 23 mmol/L (ref 22–32)
Chloride: 99 mmol/L — ABNORMAL LOW (ref 101–111)
Creatinine, Ser: 0.6 mg/dL — ABNORMAL LOW (ref 0.61–1.24)
GLUCOSE: 125 mg/dL — AB (ref 65–99)
Potassium: 4.2 mmol/L (ref 3.5–5.1)
SODIUM: 132 mmol/L — AB (ref 135–145)

## 2015-03-19 LAB — LEGIONELLA PNEUMOPHILA SEROGP 1 UR AG: L. pneumophila Serogp 1 Ur Ag: NEGATIVE

## 2015-03-19 MED ORDER — FAMOTIDINE 20 MG PO TABS
20.0000 mg | ORAL_TABLET | Freq: Two times a day (BID) | ORAL | Status: DC
  Administered 2015-03-19 – 2015-03-23 (×9): 20 mg via ORAL
  Filled 2015-03-19 (×9): qty 1

## 2015-03-19 MED ORDER — HYDROCOD POLST-CPM POLST ER 10-8 MG/5ML PO SUER
5.0000 mL | Freq: Two times a day (BID) | ORAL | Status: DC
  Administered 2015-03-19 – 2015-03-23 (×9): 5 mL via ORAL
  Filled 2015-03-19 (×9): qty 5

## 2015-03-19 MED ORDER — GLUCERNA SHAKE PO LIQD
237.0000 mL | Freq: Three times a day (TID) | ORAL | Status: DC
  Administered 2015-03-20 – 2015-03-23 (×4): 237 mL via ORAL
  Filled 2015-03-19 (×6): qty 237

## 2015-03-19 MED ORDER — PANTOPRAZOLE SODIUM 40 MG PO TBEC
40.0000 mg | DELAYED_RELEASE_TABLET | Freq: Every day | ORAL | Status: DC
  Administered 2015-03-19 – 2015-03-23 (×5): 40 mg via ORAL
  Filled 2015-03-19 (×5): qty 1

## 2015-03-19 MED ORDER — GLUCERNA PO LIQD
237.0000 mL | Freq: Two times a day (BID) | ORAL | Status: DC
  Administered 2015-03-19: 237 mL via ORAL
  Filled 2015-03-19 (×4): qty 237

## 2015-03-19 NOTE — Progress Notes (Addendum)
[facility] Progress Note   Jon Love  ZOX:[id number]  DOB: [birth year]  DOA: 03/15/2015 PCP: No PCP Per Patient  Brief narrative: Jon Love is a 39 y.o. male with AIDS who presents to the hospital for shortness of breath and cough for 6 days. He also complains of mid abdominal pain and vomiting. No complaint of diarrhea. He admits that he has not been yet been able to obtain his HIV meds and therefore has not been taking any. In the ER a chest x-ray revealed bilateral infiltrates and he was noted to be hypoxic with oxygen saturations in the mid 80s.   Subjective: Cough is quite bad today. Continues to have yellow sputum. Continues to have burning in his throat and his abdomen. No regurgitation. Mild dyspnea with exertion. No nausea or vomiting. No diarrhea.  Assessment/Plan: Principal Problem:   Bilateral Pneumonia/acute resp failure - strep antigen positive - continue levofloxacin -High risk for PCP pneumonia-started on Bactrim and prednisone- PCP smear pending- CXR reveals reticulonodular infiltrates -Legionella antigen pending -Continues to require 5- 6 L of O2 at rest which needs to be increased with exertion -Start Tussionex for cough -Continue to follow closely in the stepdown unit  Active Problems:    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 vs hyponatremia due to diuretic effect of Bactrim  -Urine sodium/ osm would likely be inaccurate in the setting on ongoing IV NS administration  - cont IVF for now as PO intake poor due to odynophagia    AIDS (acquired immunodeficiency syndrome), CD4 <=200 (HCC) - Per ID notes, the patient has not followed up with ID to obtain medications -CD4 count is 10 -started anti-retrovirals - ID consulted and also following-  Zithromax added for prophylaxis    Dysphagia/ odynophagia  -no thrush noted but possibly has esophageal candida- cont fluconazole which has been started by ID - may have GERD/ esophagitis/ Gastritis esp as he is now on high dose Prednisone - start Protonix and Pepcid  Diabetes mellitus -Hemoglobin A1c mildly elevated at 6.3- provided teaching regarding diabetes- dietician consulted to instruct on diabetic diet   Code Status:     Code Status Orders        Start     Ordered   03/16/15 0212  Full code   Continuous     03/16/15 0211     Family Communication:  Disposition Plan: Continue antibiotics; cont to follow in SDU until O2 requirements improve DVT prophylaxis: Heparin Consultants: Infectious disease Procedures:  Antibiotics: Anti-infectives    Start     Dose/Rate Route Frequency Ordered Stop   03/17/15 1530  fluconazole (DIFLUCAN) IVPB 200 mg     200 mg 100 mL/hr over 60 Minutes Intravenous Every 24 hours 03/17/15 1504     03/16/15 2300  levofloxacin (LEVAQUIN) IVPB 750 mg  Status:  Discontinued     750 mg 100 mL/hr over 90 Minutes Intravenous Every 24 hours 03/16/15 0211 03/16/15 1622   03/16/15 2200  levofloxacin (LEVAQUIN) IVPB 750 mg     750 mg 100 mL/hr over 90 Minutes Intravenous Every 24 hours 03/16/15 1622 03/21/15 2159   03/16/15 0800  elvitegravir-cobicistat-emtricitabine-tenofovir (GENVOYA) 150-150-200-10 MG tablet 1 tablet     1 tablet Oral Daily with breakfast 03/16/15 0211     03/16/15 0800  sulfamethoxazole-trimethoprim (BACTRIM) 290 mg in dextrose 5 % 250 mL IVPB  Comments:  ~5mg /kg IV Q8H   290 mg 268.1 mL/hr over 60 Minutes Intravenous Every 8 hours 03/16/15 0211     03/15/15 2200  vancomycin (VANCOCIN) IVPB 1000 mg/200 mL premix  Status:  Discontinued     1,000 mg 200 mL/hr over 60 Minutes Intravenous  Once 03/15/15 2155 03/16/15 0211   03/15/15 2145  sulfamethoxazole-trimethoprim (BACTRIM) 320 mg in dextrose 5 % 500 mL IVPB     320 mg 346.7 mL/hr over 90 Minutes Intravenous   Once 03/15/15 2134 03/16/15 0134   03/15/15 2145  levofloxacin (LEVAQUIN) IVPB 750 mg     750 mg 100 mL/hr over 90 Minutes Intravenous  Once 03/15/15 2134 03/16/15 0134      Objective: Filed Weights   03/17/15 0542 03/18/15 0500 03/19/15 0343  Weight: 57.063 kg (125 lb 12.8 oz) 57.063 kg (125 lb 12.8 oz) 55.974 kg (123 lb 6.4 oz)    Intake/Output Summary (Last 24 hours) at 03/19/15 1129 Last data filed at 03/19/15 1000  Gross per 24 hour  Intake 4224.26 ml  Output   3700 ml  Net 524.26 ml     Vitals Filed Vitals:   03/19/15 0736 03/19/15 0800 03/19/15 0900 03/19/15 1000  BP: 85/54 109/69    Pulse: 73 78 106 85  Temp: 97.8 F (36.6 C)     TempSrc: Oral     Resp: 19 19 19 20   Height:      Weight:      SpO2: 92% 92% 87% 90%    Exam:  General:  Pt is alert, not in acute distress  HEENT: No icterus, No thrush, oral mucosa moist  Cardiovascular: regular rate and rhythm, S1/S2 No murmur  Respiratory: crackles bilaterally at bases and in mid lung fields    Abdomen: Soft, +Bowel sounds, non tender, non distended, no guarding  MSK: No LE edema, cyanosis or clubbing  Data Reviewed: Basic Metabolic Panel:  Recent Labs Lab 03/15/15 2054 03/16/15 0346 03/16/15 0858 03/17/15 1225 03/18/15 0443 03/19/15 0236  NA 129*  --  133* 129* 131* 132*  K 3.7  --  3.5 3.3* 4.2 4.2  CL 93*  --  105 99* 102 99*  CO2 20*  --  19* 20* 22 23  GLUCOSE 122*  --  114* 152* 119* 125*  BUN 11  --  8 7 6 9   CREATININE 1.02 0.67 0.70 0.72 0.67 0.60*  CALCIUM 9.2  --  8.5* 8.6* 8.6* 8.9   Liver Function Tests:  Recent Labs Lab 03/15/15 2054  AST 22  ALT 22  ALKPHOS 93  BILITOT 1.2  PROT 7.3  ALBUMIN 3.3*    Recent Labs Lab 03/15/15 2054  LIPASE 21*   No results for input(s): AMMONIA in the last 168 hours. CBC:  Recent Labs Lab 03/15/15 2054 03/16/15 0346 03/17/15 1225 03/18/15 0443  WBC 10.7* 7.3 12.4* 6.4  HGB 14.6 11.7* 12.6* 12.2*  HCT 42.2 34.4* 35.3*  36.0*  MCV 71.0* 70.3* 69.2* 70.3*  PLT 368 293 371 350   Cardiac Enzymes: No results for input(s): CKTOTAL, CKMB, CKMBINDEX, TROPONINI in the last 168 hours. BNP (last 3 results) No results for input(s): BNP in the last 8760 hours.  ProBNP (last 3 results) No results for input(s): PROBNP in the last 8760 hours.  CBG:  Recent Labs Lab 03/17/15 0758 03/18/15 0742 03/18/15 1719 03/18/15 2120  GLUCAP 144* 138* 143* 139*    Recent Results (from the past 240 hour(s))  Blood culture (routine x  2)     Status: None (Preliminary result)   Collection Time: 03/15/15 10:05 PM  Result Value Ref Range Status   Specimen Description BLOOD RIGHT ARM  Final   Special Requests BOTTLES DRAWN AEROBIC AND ANAEROBIC 5CC  Final   Culture NO GROWTH 2 DAYS  Final   Report Status PENDING  Incomplete  Blood culture (routine x 2)     Status: None (Preliminary result)   Collection Time: 03/15/15 10:23 PM  Result Value Ref Range Status   Specimen Description BLOOD RIGHT FOREARM  Final   Special Requests BOTTLES DRAWN AEROBIC AND ANAEROBIC  Final   Culture NO GROWTH 2 DAYS  Final   Report Status PENDING  Incomplete  MRSA PCR Screening     Status: None   Collection Time: 03/16/15  4:39 PM  Result Value Ref Range Status   MRSA by PCR NEGATIVE NEGATIVE Final    Comment:        The GeneXpert MRSA Assay (FDA approved for NASAL specimens only), is one component of a comprehensive MRSA colonization surveillance program. It is not intended to diagnose MRSA infection nor to guide or monitor treatment for MRSA infections.   Culture, expectorated sputum-assessment     Status: None   Collection Time: 03/17/15  3:27 PM  Result Value Ref Range Status   Specimen Description SPUTUM  Final   Special Requests Immunocompromised  Final   Sputum evaluation   Final    THIS SPECIMEN IS ACCEPTABLE. RESPIRATORY CULTURE REPORT TO FOLLOW.   Report Status 03/17/2015 FINAL  Final  Culture, respiratory  (NON-Expectorated)     Status: None (Preliminary result)   Collection Time: 03/17/15  3:27 PM  Result Value Ref Range Status   Specimen Description SPUTUM  Final   Special Requests NONE  Final   Gram Stain   Final    MODERATE WBC PRESENT,BOTH PMN AND MONONUCLEAR NO SQUAMOUS EPITHELIAL CELLS SEEN NO ORGANISMS SEEN Performed at [facility]    Culture   Final    NORMAL OROPHARYNGEAL FLORA Performed at [facility]    Report Status PENDING  Incomplete  AFB culture with smear     Status: None (Preliminary result)   Collection Time: 03/17/15  3:29 PM  Result Value Ref Range Status   Specimen Description SPUTUM  Final   Special Requests Immunocompromised  Final   Acid Fast Smear   Final    NO ACID FAST BACILLI SEEN Performed at [facility]    Culture   Final    CULTURE WILL BE EXAMINED FOR 6 WEEKS BEFORE ISSUING A FINAL REPORT Performed at [facility]    Report Status PENDING  Incomplete     Studies: Dg Chest Port 1 View  03/17/2015   CLINICAL DATA:  Shortness of breath and cough, 6 days duration. HIV patient.  EXAM: PORTABLE CHEST 1 VIEW  COMPARISON:  03/15/2015  FINDINGS: Heart size is normal. Mediastinal shadows are normal. Patchy and reticular nodular shadowing throughout both lungs is improved since 2 days ago consistent with improving pneumonia. No area of dense consolidation or collapse. No effusions. Bony structures unremarkable.  IMPRESSION: Improvement in the appearance of patchy and reticulonodular infiltrate shadows throughout the lungs that were demonstrated 2 days ago. No worsening or new findings.   Electronically Signed   By: Paulina Fusi M.D.   On: 03/17/2015 12:56    Scheduled Meds:  Scheduled Meds: . antiseptic oral rinse  7 mL Mouth Rinse BID  .  chlorpheniramine-HYDROcodone  5 mL Oral Q12H  . elvitegravir-cobicistat-emtricitabine-tenofovir  1 tablet Oral Q breakfast  . famotidine  20 mg Oral BID  . fluconazole (DIFLUCAN) IV   200 mg Intravenous Q24H  . folic acid  1 mg Oral Daily  . GLUCERNA  237 mL Oral BID BM  . heparin  5,000 Units Subcutaneous 3 times per day  . Influenza vac split quadrivalent PF  0.5 mL Intramuscular Tomorrow-1000  . insulin aspart  0-5 Units Subcutaneous QHS  . insulin aspart  0-9 Units Subcutaneous TID WC  . levofloxacin (LEVAQUIN) IV  750 mg Intravenous Q24H  . multivitamin with minerals  1 tablet Oral Daily  . pantoprazole  40 mg Oral Daily  . predniSONE  40 mg Oral BID WC  . sodium chloride  3 mL Intravenous Q12H  . sulfamethoxazole-trimethoprim  290 mg Intravenous Q8H  . thiamine  100 mg Oral Daily   Continuous Infusions: . sodium chloride 75 mL/hr at 03/18/15 1942    Time spent on care of this patient: 35 min   Kamyra Schroeck, MD 03/19/2015, 11:29 AM  LOS: 4 days   [facility] Office  [phone] Pager - Text Page per [email or web address] If 7PM-7AM, please contact night-coverage [email or web address]

## 2015-03-19 NOTE — Progress Notes (Signed)
[facility]    Date of Admission:  03/15/2015   Total days of antibiotics 5        Day 5 bactrim        Day 5 levo        Day 3 fluc   ID: Jon Love is a 39 y.o. male with  Advanced HIV disease with strep pneumo and presumed PCP pneumonia, ART naive  Principal Problem:   Streptococcal pneumonia (HCC) Active Problems:   Hyponatremia   AIDS (acquired immunodeficiency syndrome), CD4 <=200 (HCC)   Acute respiratory failure (HCC)   Severe sepsis (HCC)    Subjective: States that he has pain with coughing, but no n/v/diarrhea. On questioning regarding at risk partners, he mentions his roommmate that may need testing  Medications:  . antiseptic oral rinse  7 mL Mouth Rinse BID  . chlorpheniramine-HYDROcodone  5 mL Oral Q12H  . elvitegravir-cobicistat-emtricitabine-tenofovir  1 tablet Oral Q breakfast  . famotidine  20 mg Oral BID  . fluconazole (DIFLUCAN) IV  200 mg Intravenous Q24H  . folic acid  1 mg Oral Daily  . GLUCERNA  237 mL Oral BID BM  . heparin  5,000 Units Subcutaneous 3 times per day  . Influenza vac split quadrivalent PF  0.5 mL Intramuscular Tomorrow-1000  . insulin aspart  0-5 Units Subcutaneous QHS  . insulin aspart  0-9 Units Subcutaneous TID WC  . levofloxacin (LEVAQUIN) IV  750 mg Intravenous Q24H  . multivitamin with minerals  1 tablet Oral Daily  . pantoprazole  40 mg Oral Daily  . predniSONE  40 mg Oral BID WC  . sodium chloride  3 mL Intravenous Q12H  . sulfamethoxazole-trimethoprim  290 mg Intravenous Q8H  . thiamine  100 mg Oral Daily    Objective: Vital signs in last 24 hours: Temp:  [97.3 F (36.3 C)-98 F (36.7 C)] 97.9 F (36.6 C) (10/10 1206) Pulse Rate:  [64-106] 80 (10/10 1206) Resp:  [16-27] 21 (10/10 1206) BP: (82-116)/(40-77) 88/40 mmHg (10/10 1206) SpO2:  [87 %-94 %] 90 % (10/10 1206) Weight:  [123 lb 6.4 oz (55.974 kg)] 123 lb 6.4 oz (55.974 kg) (10/10 0343)  Physical Exam  Constitutional: He  is oriented to person, place, and time. He appears cachetic. No distress.  HENT: +thrush Mouth/Throat: Oropharynx is clear and moist. No oropharyngeal exudate.  Cardiovascular: Normal rate, regular rhythm and normal heart sounds. Exam reveals no gallop and no friction rub.  No murmur heard.  Pulmonary/Chest: Effort normal and breath sounds normal. desats with movement. Inspiratory crackles at bases Abdominal: Soft. Bowel sounds are normal. He exhibits no distension. There is no tenderness.  Lymphadenopathy:  He has no cervical adenopathy.  Neurological: He is alert and oriented to person, place, and time.  Skin: Skin is warm and dry. No rash noted. No erythema.  Psychiatric: He has a normal mood and affect. His behavior is normal.     Lab Results  Recent Labs  03/17/15 1225 03/18/15 0443 03/19/15 0236  WBC 12.4* 6.4  --   HGB 12.6* 12.2*  --   HCT 35.3* 36.0*  --   NA 129* 131* 132*  K 3.3* 4.2 4.2  CL 99* 102 99*  CO2 20* 22 23  BUN CREATININE 0.72 0.67 0.60*   Studies/Results: Strep pneumo ur antigen +  Assessment/Plan: Strep pneumo PNA = continue with ceftriaxone for total of 5 days  PCP PNA = continue on IV bactrim  plus steroids. Still having slow response  Advanced hiv = continue on genvoya. Viral load and genotype pending  oi proph = start azithromycin  Q wk  Oral thrush = continue on fluconazole  daily x 14d. Can switch to orals.    [facility] Cell: [phone] Pager: [phone]  03/19/2015, 1:45 PM

## 2015-03-19 NOTE — Progress Notes (Signed)
Initial Nutrition Assessment  DOCUMENTATION CODES:   Non-severe (moderate) malnutrition in context of chronic illness  INTERVENTION:    Spanish education materials provided for diabetic diet.  Glucerna Shake PO TID, each supplement provides 220 kcal and 10 grams of protein  NUTRITION DIAGNOSIS:   Malnutrition related to chronic illness as evidenced by mild depletion of muscle mass, mild depletion of body fat.  GOAL:   Patient will meet greater than or equal to 90% of their needs  MONITOR:   PO intake, Supplement acceptance, Labs, Weight trends  REASON FOR ASSESSMENT:   Malnutrition Screening Tool, Consult Diet education  ASSESSMENT:   39 y.o. male with AIDS who presents to the hospital for shortness of breath and cough for 6 days. He also complains of mid abdominal pain and vomiting.  In the ER a chest x-ray revealed bilateral infiltrates and he was noted to be hypoxic with oxygen saturations in the mid 80s.  Received consult for diabetes diet education. Now is not the best time for in-depth diet education given hospitalization for acute illness. Patient speaks very little [place]. Provided a handout on a diabetes diet in [place]. Patient very appreciative. If further education requested by patient, can schedule an interpretor at a later date when patient is feeling better and is closer to d/c home. Spoke with RN about plan.   Nutrition-Focused physical exam completed. Findings are mild-moderate fat depletion and mild-moderate muscle depletion. Patient with ~16% weight loss over the past 3 months. He meets criteria for moderate malnutrition in the context of chronic illness. Glucerna shakes ordered today, patient likes vanilla flavor.   Diet Order:  Diet regular Room service appropriate?: Yes; Fluid consistency:: Thin  Skin:  Reviewed, no issues  Last BM:  unknown  Height:   Ht Readings from Last 1 Encounters:  03/16/15  (1.727 m)    Weight:   Wt Readings from  Last 1 Encounters:  03/19/15 123 lb 6.4 oz (55.974 kg)    Ideal Body Weight:  70 kg  BMI:  Body mass index is 18.77 kg/(m^2).  Estimated Nutritional Needs:   Kcal:  1800-2000  Protein:  90-110 gm  Fluid:  1.8-2 L  EDUCATION NEEDS:   Education needs addressed  Joaquin Courts, RD, LDN, CNSC Pager [phone] After Hours Pager [phone]

## 2015-03-20 LAB — BASIC METABOLIC PANEL
ANION GAP: 9 (ref 5–15)
BUN: 12 mg/dL (ref 6–20)
CO2: 23 mmol/L (ref 22–32)
Calcium: 9.1 mg/dL (ref 8.9–10.3)
Chloride: 98 mmol/L — ABNORMAL LOW (ref 101–111)
Creatinine, Ser: 0.65 mg/dL (ref 0.61–1.24)
Glucose, Bld: 129 mg/dL — ABNORMAL HIGH (ref 65–99)
POTASSIUM: 5 mmol/L (ref 3.5–5.1)
SODIUM: 130 mmol/L — AB (ref 135–145)

## 2015-03-20 LAB — GLUCOSE, CAPILLARY
GLUCOSE-CAPILLARY: 107 mg/dL — AB (ref 65–99)
GLUCOSE-CAPILLARY: 119 mg/dL — AB (ref 65–99)
GLUCOSE-CAPILLARY: 139 mg/dL — AB (ref 65–99)
Glucose-Capillary: 163 mg/dL — ABNORMAL HIGH (ref 65–99)

## 2015-03-20 LAB — PNEUMOCYSTIS JIROVECI SMEAR BY DFA: PNEUMOCYSTIS JIROVECI AG: NEGATIVE

## 2015-03-20 LAB — CULTURE, RESPIRATORY: CULTURE: NORMAL

## 2015-03-20 LAB — CULTURE, RESPIRATORY W GRAM STAIN

## 2015-03-20 MED ORDER — AZITHROMYCIN 600 MG PO TABS: 1200.0000 mg | ORAL_TABLET | ORAL | Status: DC

## 2015-03-20 MED ORDER — AZITHROMYCIN 600 MG PO TABS
1200.0000 mg | ORAL_TABLET | ORAL | Status: DC
  Filled 2015-03-20: qty 2

## 2015-03-20 MED ORDER — AZITHROMYCIN 600 MG PO TABS
1200.0000 mg | ORAL_TABLET | ORAL | Status: DC
  Administered 2015-03-21: 1200 mg via ORAL
  Filled 2015-03-20 (×2): qty 2

## 2015-03-20 MED ORDER — FLUCONAZOLE 100 MG PO TABS
200.0000 mg | ORAL_TABLET | Freq: Every day | ORAL | Status: DC
  Administered 2015-03-20 – 2015-03-22 (×3): 200 mg via ORAL
  Filled 2015-03-20: qty 1
  Filled 2015-03-20 (×2): qty 2
  Filled 2015-03-20: qty 1

## 2015-03-20 NOTE — Progress Notes (Signed)
Interpreter Wyvonnia Dusky for [employer]

## 2015-03-20 NOTE — Progress Notes (Addendum)
[facility] Progress Note   Jon Love  WUJ:[id number]  DOB: 10-29-1975  DOA: 03/15/2015 PCP: No PCP Per Patient  Brief narrative: Jon Love is a 39 y.o. male with AIDS who presents to the hospital for shortness of breath and cough for 6 days. He also complains of mid abdominal pain and vomiting. No complaint of diarrhea. He admits that he has not been yet been able to obtain his HIV meds and therefore has not been taking any. In the ER a chest x-ray revealed bilateral infiltrates and he was noted to be hypoxic with oxygen saturations in the mid 80s.   Subjective: Cough mildly improved. Not having as much burning/ pain with swallowing today. Able to drink water and coffee without trouble now. Continues to be able to ambulate to the bathroom without significant dyspnea.   Assessment/Plan: Principal Problem:   Bilateral Pneumonia/acute resp failure - CXR reveals b/l reticulonodular infiltrates - strep antigen positive -   Levofloxacin started on 10/6 - continue or 7 day course per ID (d/w Dr Drue Second today) -High risk for PCP pneumonia-started on Bactrim and prednisone- PCP smear pending -Legionella antigen Neg -Continues to require 6 L of O2 at rest which is keeping his pulse ox at 88-90% -Started Tussionex for cough -Continue to follow closely in the stepdown unit- would transfer to med/surg when O2 requirements improve   Active Problems:    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 vs hyponatremia due to diuretic effect of Bactrim  -Urine sodium/ osm would likely be inaccurate in the setting on ongoing IV NS administration  - K noted to be rising today which can also occur from bactrim use - follow - cont IVF for now as PO intake poor due to odynophagia    AIDS (acquired immunodeficiency syndrome), CD4  <=200 (HCC) - Per ID notes, the patient has not followed up with ID to obtain medications -CD4 count is 10 -started anti-retrovirals - ID consulted and also following-  Zithromax added for prophylaxis   Dysphagia/ odynophagia  - possibly has esophageal candida- cont fluconazole which was started by ID on 10/8-ID recommended 14 day course - may have GERD/ esophagitis/ Gastritis esp as he is now on high dose Prednisone - started Protonix and Pepcid on 10/10 - symptoms slightly improved after starting PPI  Diabetes mellitus -Hemoglobin A1c mildly elevated at 6.3- provided teaching regarding diabetes- dietician consulted to instruct on diabetic diet   Code Status:     Code Status Orders        Start     Ordered   03/16/15 0212  Full code   Continuous     03/16/15 0211     Family Communication:  Disposition Plan: Continue antibiotics; cont to follow in SDU until O2 requirements improve DVT prophylaxis: Heparin Consultants: Infectious disease Procedures:  Antibiotics: Anti-infectives    Start     Dose/Rate Route Frequency Ordered Stop   03/21/15 2200  azithromycin (ZITHROMAX) tablet 1,200 mg     1,200 mg Oral Weekly 03/20/15 1009     03/21/15 1200  azithromycin (ZITHROMAX) tablet 1,200 mg  Status:  Discontinued     1,200 mg Oral Weekly 03/20/15 1006 03/20/15 1009   03/20/15 1500  fluconazole (DIFLUCAN) tablet 200 mg     200 mg Oral Daily 03/20/15 1017     03/20/15 1400  azithromycin (ZITHROMAX) tablet 1,200  mg  Status:  Discontinued     1,200 mg Oral Weekly 03/20/15 1000 03/20/15 1006   03/17/15 1530  fluconazole (DIFLUCAN) IVPB 200 mg  Status:  Discontinued     200 mg 100 mL/hr over 60 Minutes Intravenous Every 24 hours 03/17/15 1504 03/20/15 1017   03/16/15 2300  levofloxacin (LEVAQUIN) IVPB 750 mg  Status:  Discontinued     750 mg 100 mL/hr over 90 Minutes Intravenous Every 24 hours 03/16/15 0211 03/16/15 1622   03/16/15 2200  levofloxacin (LEVAQUIN) IVPB 750 mg      750 mg 100 mL/hr over 90 Minutes Intravenous Every 24 hours 03/16/15 1622 03/21/15 2159   03/16/15 0800  elvitegravir-cobicistat-emtricitabine-tenofovir (GENVOYA) 150-150-200-10 MG tablet 1 tablet     1 tablet Oral Daily with breakfast 03/16/15 0211     03/16/15 0800  sulfamethoxazole-trimethoprim (BACTRIM) 290 mg in dextrose 5 % 250 mL IVPB    Comments:  ~5mg /kg IV Q8H   290 mg 268.1 mL/hr over 60 Minutes Intravenous Every 8 hours 03/16/15 0211     03/15/15 2200  vancomycin (VANCOCIN) IVPB 1000 mg/200 mL premix  Status:  Discontinued     1,000 mg 200 mL/hr over 60 Minutes Intravenous  Once 03/15/15 2155 03/16/15 0211   03/15/15 2145  sulfamethoxazole-trimethoprim (BACTRIM) 320 mg in dextrose 5 % 500 mL IVPB     320 mg 346.7 mL/hr over 90 Minutes Intravenous  Once 03/15/15 2134 03/16/15 0134   03/15/15 2145  levofloxacin (LEVAQUIN) IVPB 750 mg     750 mg 100 mL/hr over 90 Minutes Intravenous  Once 03/15/15 2134 03/16/15 0134      Objective: Filed Weights   03/18/15 0500 03/19/15 0343 03/20/15 0416  Weight: 57.063 kg (125 lb 12.8 oz) 55.974 kg (123 lb 6.4 oz) 55.157 kg (121 lb 9.6 oz)    Intake/Output Summary (Last 24 hours) at 03/20/15 1226 Last data filed at 03/20/15 1100  Gross per 24 hour  Intake 3551.26 ml  Output   3150 ml  Net 401.26 ml     Vitals Filed Vitals:   03/20/15 0416 03/20/15 0500 03/20/15 0600 03/20/15 0800  BP:    100/64  Pulse:  63 59 66  Temp: 97.4 F (36.3 C)   97.5 F (36.4 C)  TempSrc: Oral   Oral  Resp:  13 18 15   Height:      Weight: 55.157 kg (121 lb 9.6 oz)     SpO2:  95% 96% 95%    Exam:  General:  Pt is alert, not in acute distress  HEENT: No icterus, No thrush, oral mucosa moist  Cardiovascular: regular rate and rhythm, S1/S2 No murmur  Respiratory: crackles bilaterally at bases and in mid lung fields    Abdomen: Soft, +Bowel sounds, non tender, non distended, no guarding  MSK: No LE edema, cyanosis or clubbing  Data  Reviewed: Basic Metabolic Panel:  Recent Labs Lab 03/16/15 0858 03/17/15 1225 03/18/15 0443 03/19/15 0236 03/20/15 0222  NA 133* 129* 131* 132* 130*  K 3.5 3.3* 4.2 4.2 5.0  CL 105 99* 102 99* 98*  CO2 19* 20* 22 23 23   GLUCOSE 114* 152* 119* 125* 129*  BUN 8 7 6 9 12   CREATININE 0.70 0.72 0.67 0.60* 0.65  CALCIUM 8.5* 8.6* 8.6* 8.9 9.1   Liver Function Tests:  Recent Labs Lab 03/15/15 2054  AST 22  ALT 22  ALKPHOS 93  BILITOT 1.2  PROT 7.3  ALBUMIN 3.3*    Recent Labs Lab  03/15/15 2054  LIPASE 21*   No results for input(s): AMMONIA in the last 168 hours. CBC:  Recent Labs Lab 03/15/15 2054 03/16/15 0346 03/17/15 1225 03/18/15 0443  WBC 10.7* 7.3 12.4* 6.4  HGB 14.6 11.7* 12.6* 12.2*  HCT 42.2 34.4* 35.3* 36.0*  MCV 71.0* 70.3* 69.2* 70.3*  PLT 368 293 371 350   Cardiac Enzymes: No results for input(s): CKTOTAL, CKMB, CKMBINDEX, TROPONINI in the last 168 hours. BNP (last 3 results) No results for input(s): BNP in the last 8760 hours.  ProBNP (last 3 results) No results for input(s): PROBNP in the last 8760 hours.  CBG:  Recent Labs Lab 03/19/15 0739 03/19/15 1205 03/19/15 1708 03/19/15 2205 03/20/15 0926  GLUCAP 135* 116* 170* 126* 107*    Recent Results (from the past 240 hour(s))  Blood culture (routine x 2)     Status: None (Preliminary result)   Collection Time: 03/15/15 10:05 PM  Result Value Ref Range Status   Specimen Description BLOOD RIGHT ARM  Final   Special Requests BOTTLES DRAWN AEROBIC AND ANAEROBIC 5CC  Final   Culture NO GROWTH 3 DAYS  Final   Report Status PENDING  Incomplete  Blood culture (routine x 2)     Status: None (Preliminary result)   Collection Time: 03/15/15 10:23 PM  Result Value Ref Range Status   Specimen Description BLOOD RIGHT FOREARM  Final   Special Requests BOTTLES DRAWN AEROBIC AND ANAEROBIC  Final   Culture NO GROWTH 3 DAYS  Final   Report Status PENDING  Incomplete  MRSA PCR Screening      Status: None   Collection Time: 03/16/15  4:39 PM  Result Value Ref Range Status   MRSA by PCR NEGATIVE NEGATIVE Final    Comment:        The GeneXpert MRSA Assay (FDA approved for NASAL specimens only), is one component of a comprehensive MRSA colonization surveillance program. It is not intended to diagnose MRSA infection nor to guide or monitor treatment for MRSA infections.   Culture, expectorated sputum-assessment     Status: None   Collection Time: 03/17/15  3:27 PM  Result Value Ref Range Status   Specimen Description SPUTUM  Final   Special Requests Immunocompromised  Final   Sputum evaluation   Final    THIS SPECIMEN IS ACCEPTABLE. RESPIRATORY CULTURE REPORT TO FOLLOW.   Report Status 03/17/2015 FINAL  Final  Culture, respiratory (NON-Expectorated)     Status: None   Collection Time: 03/17/15  3:27 PM  Result Value Ref Range Status   Specimen Description SPUTUM  Final   Special Requests NONE  Final   Gram Stain   Final    MODERATE WBC PRESENT,BOTH PMN AND MONONUCLEAR NO SQUAMOUS EPITHELIAL CELLS SEEN NO ORGANISMS SEEN Performed at [facility]    Culture   Final    NORMAL OROPHARYNGEAL FLORA Performed at [facility]    Report Status 03/20/2015 FINAL  Final  AFB culture with smear     Status: None (Preliminary result)   Collection Time: 03/17/15  3:29 PM  Result Value Ref Range Status   Specimen Description SPUTUM  Final   Special Requests Immunocompromised  Final   Acid Fast Smear   Final    NO ACID FAST BACILLI SEEN Performed at [facility]    Culture   Final    CULTURE WILL BE EXAMINED FOR 6 WEEKS BEFORE ISSUING A FINAL REPORT Performed at [facility]  Report Status PENDING  Incomplete     Studies: No results found.  Scheduled Meds:  Scheduled Meds: . antiseptic oral rinse  7 mL Mouth Rinse BID  . [START ON 03/21/2015] azithromycin  1,200 mg Oral Weekly  . chlorpheniramine-HYDROcodone  5 mL Oral Q12H  .  elvitegravir-cobicistat-emtricitabine-tenofovir  1 tablet Oral Q breakfast  . famotidine  20 mg Oral BID  . feeding supplement (GLUCERNA SHAKE)  237 mL Oral TID BM  . fluconazole  200 mg Oral Daily  . folic acid  1 mg Oral Daily  . heparin  5,000 Units Subcutaneous 3 times per day  . insulin aspart  0-5 Units Subcutaneous QHS  . insulin aspart  0-9 Units Subcutaneous TID WC  . levofloxacin (LEVAQUIN) IV  750 mg Intravenous Q24H  . multivitamin with minerals  1 tablet Oral Daily  . pantoprazole  40 mg Oral Daily  . predniSONE  40 mg Oral BID WC  . sodium chloride  3 mL Intravenous Q12H  . sulfamethoxazole-trimethoprim  290 mg Intravenous Q8H  . thiamine  100 mg Oral Daily   Continuous Infusions: . sodium chloride 75 mL/hr (03/19/15 1329)    Time spent on care of this patient: 35 min   Kadir Azucena, MD 03/20/2015, 12:26 PM  LOS: 5 days   [facility] Office  [phone] Pager - Text Page per [email or web address] If 7PM-7AM, please contact night-coverage [email or web address]

## 2015-03-20 NOTE — Care Management Note (Signed)
Case Management Note  Patient Details  Name: Jon Love MRN: [id number] Date of Birth: 18-Feb-1976  Subjective/Objective:      PNA, AIDS      Action/Plan:  Home. NCM spoke to pt using Spanish Interpreter # [phone]. Pt thought his medications would come to him in the mail. Pt was not comprehending that he needed to go to the [facility] to follow up with MD for continued treatment. States he has not had any sexual activity in last seven months since finding out he was HIV positive and he knows to use condoms if he has sexual activity. States he plans to follow up at appts post dc to continue treatment to minimize complication with his disease process. NCM will continue to follow up with appts at [facility] for PCP follow up and access to his other medication needed besides HIV meds. Pt will receive his HIV meds though program offered at ID clinic.  Lives at home with a friend, Rodolf. States his wife is in [place]. Waiting final recommendations for home.  Expected Discharge Date:                  Expected Discharge Plan:  Home/Self Care  In-House Referral:     Discharge planning Services  CM Consult, Medication Assistance, MATCH Program, Indigent Health Clinic  Post Acute Care Choice:    Choice offered to:     DME Arranged:    DME Agency:     HH Arranged:    HH Agency:     Status of Service:  In process, will continue to follow  Medicare Important Message Given:    Date Medicare IM Given:    Medicare IM give by:    Date Additional Medicare IM Given:    Additional Medicare Important Message give by:     If discussed at Long Length of Stay Meetings, dates discussed:    Additional Comments:  Elliot Cousin, RN 03/20/2015, 5:15 PM

## 2015-03-20 NOTE — Progress Notes (Signed)
[facility]    Date of Admission:  03/15/2015   Total days of antibiotics 6        Day 6 bactrim        Day 6 levo        Day 4 fluc   ID: Jon Love is a 39 y.o. 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 antiseptic oral rinse  7 mL Mouth Rinse BID  . [START ON 03/21/2015] azithromycin  1,200 mg Oral Weekly  . chlorpheniramine-HYDROcodone  5 mL Oral Q12H  . elvitegravir-cobicistat-emtricitabine-tenofovir  1 tablet Oral Q breakfast  . famotidine  20 mg Oral BID  . feeding supplement (GLUCERNA SHAKE)  237 mL Oral TID BM  . fluconazole  200 mg Oral Daily  . folic acid  1 mg Oral Daily  . heparin  5,000 Units Subcutaneous 3 times per day  . insulin aspart  0-5 Units Subcutaneous QHS  . insulin aspart  0-9 Units Subcutaneous TID WC  . levofloxacin (LEVAQUIN) IV  750 mg Intravenous Q24H  . multivitamin with minerals  1 tablet Oral Daily  . pantoprazole  40 mg Oral Daily  . predniSONE  40 mg Oral BID WC  . sodium chloride  3 mL Intravenous Q12H  . sulfamethoxazole-trimethoprim  290 mg Intravenous Q8H  . thiamine  100 mg Oral Daily    Objective: Vital signs in last 24 hours: Temp:  [97.4 F (36.3 C)-98.2 F (36.8 C)] 98.2 F (36.8 C) (10/11 2041) Pulse Rate:  [59-92] 87 (10/11 2000) Resp:  [12-20] 20 (10/11 2000) BP: (83-115)/(52-77) 115/77 mmHg (10/11 2000) SpO2:  [91 %-100 %] 91 % (10/11 2000) Weight:  [121 lb 9.6 oz (55.157 kg)] 121 lb 9.6 oz (55.157 kg) (10/11 0416)  Physical Exam  Constitutional: He is oriented to person, place, and time. He appears cachetic. No distress.    HENT: +thrush Mouth/Throat: Oropharynx is clear and moist. No oropharyngeal exudate.  Cardiovascular: Normal rate, regular rhythm and normal heart sounds. Exam reveals no gallop and no friction rub.  No murmur heard.  Pulmonary/Chest: Effort normal and breath sounds normal. desats with movement. Inspiratory wheezing at bases Abdominal: Soft. Bowel sounds are normal. He exhibits no distension. There is no tenderness.  Lymphadenopathy:  He has no cervical adenopathy.  Neurological: He is alert and oriented to person, place, and time.  Skin: Skin is warm and dry. No rash noted. No erythema.  Psychiatric: He has a normal mood and affect. His behavior is normal.     Lab Results  Recent Labs  03/18/15 0443 03/19/15 0236 03/20/15 0222  WBC 6.4  --   --   HGB 12.2*  --   --   HCT 36.0*  --   --   NA 131* 132* 130*  K 4.2 4.2 5.0  CL 102 99* 98*  CO2 BUN CREATININE 0.67 0.60* 0.65   Studies/Results: Strep pneumo ur antigen + PCP dfa negative  Assessment/Plan: Strep pneumo PNA = continue with levofloxacin for total of 5 days  PCP PNA = continue on IV bactrim plus steroids. Still  having slow response. Likely false negative PCP DFA. Clinically appears c/w PCP. Continue with empiric treatment  Advanced hiv = continue on genvoya. Viral load and genotype pending  oi proph = start azithromycin  Q wk  Oral thrush = continue on fluconazole  daily x 14d.     Drue Second [facility] Infect[facility] Diseases Cell: [phone] Pager: [phone]  03/20/2015, 10:20 PM

## 2015-03-20 NOTE — Progress Notes (Signed)
Interpreter Wyvonnia Dusky for [facility]

## 2015-03-21 DIAGNOSIS — K219 Gastro-esophageal reflux disease without esophagitis: Secondary | ICD-10-CM

## 2015-03-21 LAB — CULTURE, BLOOD (ROUTINE X 2)
Culture: NO GROWTH
Culture: NO GROWTH

## 2015-03-21 LAB — BASIC METABOLIC PANEL
Anion gap: 12 (ref 5–15)
BUN: 12 mg/dL (ref 6–20)
CALCIUM: 9.2 mg/dL (ref 8.9–10.3)
CHLORIDE: 94 mmol/L — AB (ref 101–111)
CO2: 24 mmol/L (ref 22–32)
CREATININE: 0.74 mg/dL (ref 0.61–1.24)
GFR calc Af Amer: >60 mL/min (ref >60)
GFR calc non Af Amer: >60 mL/min (ref >60)
Glucose, Bld: 119 mg/dL — ABNORMAL HIGH (ref 65–99)
Potassium: 4.6 mmol/L (ref 3.5–5.1)
SODIUM: 130 mmol/L — AB (ref 135–145)

## 2015-03-21 LAB — GLUCOSE, CAPILLARY
GLUCOSE-CAPILLARY: 139 mg/dL — AB (ref 65–99)
GLUCOSE-CAPILLARY: 178 mg/dL — AB (ref 65–99)
Glucose-Capillary: 102 mg/dL — ABNORMAL HIGH (ref 65–99)
Glucose-Capillary: 131 mg/dL — ABNORMAL HIGH (ref 65–99)

## 2015-03-21 NOTE — Progress Notes (Signed)
[facility] Progress Note   Jon Love  ZOX:[id number]  DOB: Jul 08, 1975  DOA: 03/15/2015 PCP: No PCP Per Patient  Brief narrative: Jon Love is a 39 y.o. male with AIDS who presents to the hospital for shortness of breath and cough for 6 days. He also complains of mid abdominal pain and vomiting. No complaint of diarrhea. He admits that he has not been yet been able to obtain his HIV meds and therefore has not been taking any. In the ER a chest x-ray revealed bilateral infiltrates and he was noted to be hypoxic with oxygen saturations in the mid 80s. Appears that patient has language and illiteracy barrier, interpreter used for extensive explanation of which going on with him right now.   Subjective: Seen with Spanish interpreter at bedside, oxygen down to 4 L his saturation is in the low 90s. Still has some cough with minimal yellow sputum. Complaining about some abdominal discomfort.  Assessment/Plan: Principal Problem:   Bilateral Pneumonia/acute resp failure - CXR reveals b/l reticulonodular infiltrates - strep antigen positive -   Levofloxacin started on 10/6 - continue or 7 day course per ID (d/w Dr Drue Second today) -High risk for PCP pneumonia-started on Bactrim and prednisone- PCP smear pending -Legionella antigen Neg -Continues to require 6 L of O2 at rest which is keeping his pulse ox at 88-90% -Started Tussionex for cough -His oxygen saturation down to 4 L, will probably transfer him to regular bed with continuous pulse ox. -Continue Bactrim and steroids.    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 vs hyponatremia due to diuretic effect of Bactrim  - K noted to be rising today which can also occur from bactrim use - follow - cont IVF for now as PO intake poor due to odynophagia    AIDS  (acquired immunodeficiency syndrome), CD4 <=200 (HCC) - Per ID notes, the patient has not followed up with ID to obtain medications -CD4 count is 10 -started anti-retrovirals - ID consulted and also following-  Zithromax added for prophylaxis   Dysphagia/ odynophagia  - possibly has esophageal candida- cont fluconazole which was started by ID on 10/8-ID recommended 14 day course - may have GERD/ esophagitis/ Gastritis esp as he is now on high dose Prednisone - started Protonix and Pepcid on 10/10 - symptoms slightly improved after starting PPI  Diabetes mellitus -Hemoglobin A1c mildly elevated at 6.3- provided teaching regarding diabetes- dietician consulted to instruct on diabetic diet   Code Status:     Code Status Orders        Start     Ordered   03/16/15 0212  Full code   Continuous     03/16/15 0211     Family Communication:  Disposition Plan: Continue antibiotics; cont to follow in SDU until O2 requirements improve DVT prophylaxis: Heparin Consultants: Infectious disease Procedures:  Antibiotics: Anti-infectives    Start     Dose/Rate Route Frequency Ordered Stop   03/21/15 2200  azithromycin (ZITHROMAX) tablet 1,200 mg     1,200 mg Oral Weekly 03/20/15 1009     03/21/15 1200  azithromycin (ZITHROMAX) tablet 1,200 mg  Status:  Discontinued     1,200 mg Oral Weekly 03/20/15 1006 03/20/15 1009   03/20/15 1500  fluconazole (DIFLUCAN) tablet 200 mg     200 mg Oral Daily 03/20/15 1017     03/20/15 1400  azithromycin [facility]) tablet 1,200 mg  Status:  Discontinued     1,200 mg Oral Weekly 03/20/15 1000 03/20/15 1006   03/17/15 1530  fluconazole (DIFLUCAN) IVPB 200 mg  Status:  Discontinued     200 mg 100 mL/hr over 60 Minutes Intravenous Every 24 hours 03/17/15 1504 03/20/15 1017   03/16/15 2300  levofloxacin (LEVAQUIN) IVPB 750 mg  Status:  Discontinued     750 mg 100 mL/hr over 90 Minutes Intravenous Every 24 hours 03/16/15 0211 03/16/15 1622   03/16/15 2200   levofloxacin (LEVAQUIN) IVPB 750 mg     750 mg 100 mL/hr over 90 Minutes Intravenous Every 24 hours 03/16/15 1622 03/20/15 2318   03/16/15 0800  elvitegravir-cobicistat-emtricitabine-tenofovir (GENVOYA) 150-150-200-10 MG tablet 1 tablet     1 tablet Oral Daily with breakfast 03/16/15 0211     03/16/15 0800  sulfamethoxazole-trimethoprim (BACTRIM) 290 mg in dextrose 5 % 250 mL IVPB    Comments:  ~5mg /kg IV Q8H   290 mg 268.1 mL/hr over 60 Minutes Intravenous Every 8 hours 03/16/15 0211     03/15/15 2200  vancomycin (VANCOCIN) IVPB 1000 mg/200 mL premix  Status:  Discontinued     1,000 mg 200 mL/hr over 60 Minutes Intravenous  Once 03/15/15 2155 03/16/15 0211   03/15/15 2145  sulfamethoxazole-trimethoprim (BACTRIM) 320 mg in dextrose 5 % 500 mL IVPB     320 mg 346.7 mL/hr over 90 Minutes Intravenous  Once 03/15/15 2134 03/16/15 0134   03/15/15 2145  levofloxacin (LEVAQUIN) IVPB 750 mg     750 mg 100 mL/hr over 90 Minutes Intravenous  Once 03/15/15 2134 03/16/15 0134      Objective: Filed Weights   03/19/15 0343 03/20/15 0416 03/21/15 0526  Weight: 55.974 kg (123 lb 6.4 oz) 55.157 kg (121 lb 9.6 oz) 55.611 kg (122 lb 9.6 oz)    Intake/Output Summary (Last 24 hours) at 03/21/15 1059 Last data filed at 03/21/15 1000  Gross per 24 hour  Intake   2523 ml  Output   2475 ml  Net     48 ml     Vitals Filed Vitals:   03/21/15 0526 03/21/15 0801 03/21/15 0900 03/21/15 1000  BP:  107/70    Pulse:  72 87 98  Temp:  97.6 F (36.4 C)    TempSrc:  Oral    Resp:  Height:      Weight: 55.611 kg (122 lb 9.6 oz)     SpO2:  92% 94% 91%    Exam:  General:  Pt is alert, not in acute distress  HEENT: No icterus, No thrush, oral mucosa moist  Cardiovascular: regular rate and rhythm, S1/S2 No murmur  Respiratory: crackles bilaterally at bases and in mid lung fields    Abdomen: Soft, +Bowel sounds, non tender, non distended, no guarding  MSK: No LE edema, cyanosis or  clubbing  Data Reviewed: Basic Metabolic Panel:  Recent Labs Lab 03/17/15 1225 03/18/15 0443 03/19/15 0236 03/20/15 0222 03/21/15 0249  NA 129* 131* 132* 130* 130*  K 3.3* 4.2 4.2 5.0 4.6  CL 99* 102 99* 98* 94*  CO2 20* GLUCOSE 152* 119* 125* 129* 119*  BUN CREATININE 0.72 0.67 0.60* 0.65 0.74  CALCIUM 8.6* 8.6* 8.9 9.1 9.2   Liver Function Tests:  Recent Labs Lab 03/15/15 2054  AST 22  ALT 22  ALKPHOS 93  BILITOT 1.2  PROT 7.3  ALBUMIN  3.3*    Recent Labs Lab 03/15/15 2054  LIPASE 21*   No results for input(s): AMMONIA in the last 168 hours. CBC:  Recent Labs Lab 03/15/15 2054 03/16/15 0346 03/17/15 1225 03/18/15 0443  WBC 10.7* 7.3 12.4* 6.4  HGB 14.6 11.7* 12.6* 12.2*  HCT 42.2 34.4* 35.3* 36.0*  MCV 71.0* 70.3* 69.2* 70.3*  PLT 368 293 371 350   Cardiac Enzymes: No results for input(s): CKTOTAL, CKMB, CKMBINDEX, TROPONINI in the last 168 hours. BNP (last 3 results) No results for input(s): BNP in the last 8760 hours.  ProBNP (last 3 results) No results for input(s): PROBNP in the last 8760 hours.  CBG:  Recent Labs Lab 03/20/15 0926 03/20/15 1225 03/20/15 1736 03/20/15 2133 03/21/15 0801  GLUCAP 107* 119* 139* 163* 102*    Recent Results (from the past 240 hour(s))  Blood culture (routine x 2)     Status: None (Preliminary result)   Collection Time: 03/15/15 10:05 PM  Result Value Ref Range Status   Specimen Description BLOOD RIGHT ARM  Final   Special Requests BOTTLES DRAWN AEROBIC AND ANAEROBIC 5CC  Final   Culture NO GROWTH 4 DAYS  Final   Report Status PENDING  Incomplete  Blood culture (routine x 2)     Status: None (Preliminary result)   Collection Time: 03/15/15 10:23 PM  Result Value Ref Range Status   Specimen Description BLOOD RIGHT FOREARM  Final   Special Requests BOTTLES DRAWN AEROBIC AND ANAEROBIC  Final   Culture NO GROWTH 4 DAYS  Final   Report Status PENDING  Incomplete  MRSA  PCR Screening     Status: None   Collection Time: 03/16/15  4:39 PM  Result Value Ref Range Status   MRSA by PCR NEGATIVE NEGATIVE Final    Comment:        The GeneXpert MRSA Assay (FDA approved for NASAL specimens only), is one component of a comprehensive MRSA colonization surveillance program. It is not intended to diagnose MRSA infection nor to guide or monitor treatment for MRSA infections.   Culture, expectorated sputum-assessment     Status: None   Collection Time: 03/17/15  3:27 PM  Result Value Ref Range Status   Specimen Description SPUTUM  Final   Special Requests Immunocompromised  Final   Sputum evaluation   Final    THIS SPECIMEN IS ACCEPTABLE. RESPIRATORY CULTURE REPORT TO FOLLOW.   Report Status 03/17/2015 FINAL  Final  Culture, respiratory (NON-Expectorated)     Status: None   Collection Time: 03/17/15  3:27 PM  Result Value Ref Range Status   Specimen Description SPUTUM  Final   Special Requests NONE  Final   Gram Stain   Final    MODERATE WBC PRESENT,BOTH PMN AND MONONUCLEAR NO SQUAMOUS EPITHELIAL CELLS SEEN NO ORGANISMS SEEN Performed at [facility]    Culture   Final    NORMAL OROPHARYNGEAL FLORA Performed at [facility]    Report Status 03/20/2015 FINAL  Final  Pneumocystis smear by DFA     Status: None   Collection Time: 03/17/15  3:28 PM  Result Value Ref Range Status   Specimen Source-PJSRC SPUTUM  Final   Pneumocystis jiroveci Ag NEGATIVE  Final    Comment: Performed at [facility]  AFB culture with smear     Status: None (Preliminary result)   Collection Time: 03/17/15  3:29 PM  Result Value Ref Range Status   Specimen Description SPUTUM  Final   Special Requests Immunocompromised  Final   Acid Fast Smear   Final    NO ACID FAST BACILLI SEEN [facility]formed at [facility]    Culture   Final    CULTURE WILL BE EXAMINED FOR 6 WEEKS BEFORE ISSUING A FINAL REPORT Performed at [facility]     Report Status PENDING  Incomplete     Studies: No results found.  Scheduled Meds:  Scheduled Meds: . antiseptic oral rinse  7 mL Mouth Rinse BID  . azithromycin  1,200 mg Oral Weekly  . chlorpheniramine-HYDROcodone  5 mL Oral Q12H  . elvitegravir-cobicistat-emtricitabine-tenofovir  1 tablet Oral Q breakfast  . famotidine  20 mg Oral BID  . feeding supplement (GLUCERNA SHAKE)  237 mL Oral TID BM  . fluconazole  200 mg Oral Daily  . folic acid  1 mg Oral Daily  . heparin  5,000 Units Subcutaneous 3 times per day  . insulin aspart  0-5 Units Subcutaneous QHS  . insulin aspart  0-9 Units Subcutaneous TID WC  . multivitamin with minerals  1 tablet Oral Daily  . pantoprazole  40 mg Oral Daily  . predniSONE  40 mg Oral BID WC  . sodium chloride  3 mL Intravenous Q12H  . sulfamethoxazole-trimethoprim  290 mg Intravenous Q8H  . thiamine  100 mg Oral Daily   Continuous Infusions: . sodium chloride 75 mL/hr at 03/21/15 0700    Time spent on care of this patient: 35 min   Paytan Recine A, MD 03/21/2015, 10:59 AM  LOS: 6 days   [facility] Office  [phone] Pager - Text Page per [email or web address] If 7PM-7AM, please contact night-coverage [email or web address]

## 2015-03-21 NOTE — Progress Notes (Signed)
Interpreter Graciela Namihira for MD °

## 2015-03-21 NOTE — Progress Notes (Signed)
Report received from RN Joni ReiningNicole from [facility].

## 2015-03-21 NOTE — Progress Notes (Signed)
[facility]    Date of Admission:  03/15/2015   Total days of antibiotics 7        Day 7 bactrim        Day 6 levo        Day 5 fluc   ID: Jon Love is a 39 y.o. 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 antiseptic oral rinse  7 mL Mouth Rinse BID  . azithromycin  1,200 mg Oral Weekly  . chlorpheniramine-HYDROcodone  5 mL Oral Q12H  . elvitegravir-cobicistat-emtricitabine-tenofovir  1 tablet Oral Q breakfast  . famotidine  20 mg Oral BID  . feeding supplement (GLUCERNA SHAKE)  237 mL Oral TID BM  . fluconazole  200 mg Oral Daily  . folic acid  1 mg Oral Daily  . heparin  5,000 Units Subcutaneous 3 times per day  . insulin aspart  0-5 Units Subcutaneous QHS  . insulin aspart  0-9 Units Subcutaneous TID WC  . multivitamin with minerals  1 tablet Oral Daily  . pantoprazole  40 mg Oral Daily  . predniSONE  40 mg Oral BID WC  . sodium chloride  3 mL Intravenous Q12H  . sulfamethoxazole-trimethoprim  290 mg Intravenous Q8H  . thiamine  100 mg Oral Daily    Objective: Vital signs in last 24 hours: Temp:  [97.4 F (36.3 C)-98.2 F (36.8 C)] 97.7 F (36.5 C) (10/12 1824) Pulse Rate:  [68-98] 90 (10/12 1824) Resp:  [14-21] 18 (10/12 1824) BP: (95-110)/(64-77) 110/77 mmHg (10/12 1824) SpO2:  [90 %-94 %] 93 % (10/12 1824) Weight:  [119 lb 9.6 oz (54.25 kg)-122 lb 9.6 oz (55.611 kg)] 119 lb 9.6 oz (54.25 kg) (10/12 1824)  Physical Exam  Constitutional: He is oriented to person, place, and time. He appears cachetic. No distress.  HENT: +thrush Mouth/Throat: Oropharynx is clear and moist. No  oropharyngeal exudate.  Cardiovascular: Normal rate, regular rhythm and normal heart sounds. Exam reveals no gallop and no friction rub.  No murmur heard.  Pulmonary/Chest: Effort normal and breath sounds normal. desats with movement. Inspiratory wheezing at bases Abdominal: Soft. Bowel sounds are normal. He exhibits no distension. There is no tenderness.  Lymphadenopathy:  He has no cervical adenopathy.  Neurological: He is alert and oriented to person, place, and time.  Skin: Skin is warm and dry. No rash noted. No erythema.  Psychiatric: He has a normal mood and affect. His behavior is normal.     Lab Results  Recent Labs  03/20/15 0222 03/21/15 0249  NA 130* 130*  K 5.0 4.6  CL 98* 94*  CO2 23 24  BUN 12 12  CREATININE 0.65 0.74   Studies/Results: Strep pneumo ur antigen + PCP dfa negative  Assessment/Plan: Strep pneumo PNA = finished a course of levofloxacin   PCP PNA = continue on IV bactrim plus steroids. Still having slow response. Likely false negative PCP DFA. Clinically appears c/w PCP. Continue with empiric treatment  Advanced hiv = continue on genvoya. Viral load and genotype pending  oi proph = start azithromycin  Q wk  Oral thrush = continue on fluconazole  daily x 14d.  GERD = continue with PPI   Drue SecondSNIDER, [facility] Cell: [phone] Pager: [phone]  03/21/2015, 8:29 PM

## 2015-03-21 NOTE — Progress Notes (Signed)
NURSING PROGRESS NOTE  Lucinda Dellntonio Hernandez [id number] Transfer Data: 03/21/2015 6:41 PM Attending Provider: Clydia LlanoMutaz Elmahi, MD PCP:No PCP Per Patient Code Status: Full  Allergies:  Penicillins Past Medical History:   has a past medical history of HIV disease (HCC). Past Surgical History:   has past surgical history that includes ERCP (N/A, 10/09/2014) and Cholecystectomy (N/A, 10/11/2014). Social History:   reports that he has never smoked. He has never used smokeless tobacco. He reports that he drinks alcohol. He reports that he does not use illicit drugs.  Sivan Ardyth HarpsHernandez is a 39 y.o. male patient transferred from [facility]  Blood pressure 110/77, pulse 90, temperature 97.7 F (36.5 C), temperature source Oral, resp. rate 18, height 6' (1.829 m), weight 54.25 kg (119 lb 9.6 oz), SpO2 93 %.  Cardiac Monitoring: Box # [id number] in place. Cardiac monitor yields:normal sinus rhythm.  IV Fluids:  IV in place, occlusive dsg intact without redness, IV cath forearm right, condition patent and no redness none.   Skin: WDL  Will continue to evaluate and treat per MD orders.   Leane PlattSpencer Ananias Kolander RN, BS, BSN

## 2015-03-22 LAB — BASIC METABOLIC PANEL
ANION GAP: 13 (ref 5–15)
BUN: 14 mg/dL (ref 6–20)
CHLORIDE: 91 mmol/L — AB (ref 101–111)
CO2: 27 mmol/L (ref 22–32)
Calcium: 9.6 mg/dL (ref 8.9–10.3)
Creatinine, Ser: 0.74 mg/dL (ref 0.61–1.24)
GFR calc Af Amer: >60 mL/min (ref >60)
GFR calc non Af Amer: >60 mL/min (ref >60)
Glucose, Bld: 113 mg/dL — ABNORMAL HIGH (ref 65–99)
POTASSIUM: 4.7 mmol/L (ref 3.5–5.1)
SODIUM: 131 mmol/L — AB (ref 135–145)

## 2015-03-22 LAB — GLUCOSE, CAPILLARY
GLUCOSE-CAPILLARY: 129 mg/dL — AB (ref 65–99)
GLUCOSE-CAPILLARY: 101 mg/dL — AB (ref 65–99)
Glucose-Capillary: 144 mg/dL — ABNORMAL HIGH (ref 65–99)
Glucose-Capillary: 130 mg/dL — ABNORMAL HIGH (ref 65–99)

## 2015-03-22 MED ORDER — PREDNISONE 20 MG PO TABS
40.0000 mg | ORAL_TABLET | Freq: Every day | ORAL | Status: DC
  Administered 2015-03-23: 40 mg via ORAL
  Filled 2015-03-22: qty 2

## 2015-03-22 NOTE — Progress Notes (Signed)
[facility] Progress Note   Jon Love  FAO:[id number]  DOB: 17-Jul-1975  DOA: 03/15/2015 PCP: No PCP Per Patient  Brief narrative: Jon Love is a 39 y.o. male with AIDS who presents to the hospital for shortness of breath and cough for 6 days. He also complains of mid abdominal pain and vomiting. No complaint of diarrhea. He admits that he has not been yet been able to obtain his HIV meds and therefore has not been taking any. In the ER a chest x-ray revealed bilateral infiltrates and he was noted to be hypoxic with oxygen saturations in the mid 80s. Appears that patient has language and illiteracy barrier, interpreter used for extensive explanation of which going on with him right now.   Subjective: Seen with Spanish interpreter at bedside, feels much better today. Less oxygen requirements, will put him on 2 L of oxygen. According to guidelines prednisone should be 40 mg twice a day for 5 days then 40 mg daily for 5 days. I will decrease the dose to 40 mg daily.  Assessment/Plan: Principal Problem:   Bilateral Pneumonia/acute resp failure - CXR reveals b/l reticulonodular infiltrates - strep antigen positive -   Levofloxacin started on 10/6 - continue or 7 day course per ID (d/w Dr Drue Second today) -High risk for PCP pneumonia-started on Bactrim and prednisone- PCP smear pending -Legionella antigen Neg -Continues to require 6 L of O2 at rest which is keeping his pulse ox at 88-90% -Started Tussionex for cough -Transferred to regular bed, will try to wean the oxygen further. -Continue Bactrim and and decrease steroids to 40 mg daily.    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 vs hyponatremia due to diuretic effect of Bactrim  - K noted to be rising today which can also occur from bactrim use - follow -  Now has appropriate by mouth intake, discontinued IV fluids.   AIDS (acquired immunodeficiency syndrome), CD4 <=200 (HCC) - Per ID notes, the patient has not followed up with ID to obtain medications -CD4 count is 10 -started anti-retrovirals - ID consulted and also following-  Zithromax added for prophylaxis   Dysphagia/ odynophagia  - possibly has esophageal candida- cont fluconazole which was started by ID on 10/8-ID recommended 14 day course - may have GERD/ esophagitis/ Gastritis esp as he is now on high dose Prednisone - started Protonix and Pepcid on 10/10 - symptoms slightly improved after starting PPI  Diabetes mellitus -Hemoglobin A1c mildly elevated at 6.3- provided teaching regarding diabetes- dietician consulted to instruct on diabetic diet   Code Status:     Code Status Orders        Start     Ordered   03/16/15 0212  Full code   Continuous     03/16/15 0211     Family Communication:  Disposition Plan: Continue antibiotics; cont to follow in SDU until O2 requirements improve DVT prophylaxis: Heparin Consultants: Infectious disease Procedures:  Antibiotics: Anti-infectives    Start     Dose/Rate Route Frequency Ordered Stop   03/21/15 2200  azithromycin (ZITHROMAX) tablet 1,200 mg     1,200 mg Oral Weekly 03/20/15 1009     03/21/15 1200  azithromycin (ZITHROMAX) tablet 1,200 mg  Status:  Discontinued     1,200 mg Oral Weekly 03/20/15 1006 03/20/15 1009   03/20/15 1500  fluconazole (DIFLUCAN) tablet 200 mg  200 mg Oral Daily 03/20/15 1017     03/20/15 1400  azithromycin (ZITHROMAX) tablet 1,200 mg  Status:  Discontinued     1,200 mg Oral Weekly 03/20/15 1000 03/20/15 1006   03/17/15 1530  fluconazole (DIFLUCAN) IVPB 200 mg  Status:  Discontinued     200 mg 100 mL/hr over 60 Minutes Intravenous Every 24 hours 03/17/15 1504 03/20/15 1017   03/16/15 2300  levofloxacin (LEVAQUIN) IVPB 750 mg  Status:  Discontinued     750 mg 100 mL/hr over 90 Minutes  Intravenous Every 24 hours 03/16/15 0211 03/16/15 1622   03/16/15 2200  levofloxacin (LEVAQUIN) IVPB 750 mg     750 mg 100 mL/hr over 90 Minutes Intravenous Every 24 hours 03/16/15 1622 03/20/15 2318   03/16/15 0800  elvitegravir-cobicistat-emtricitabine-tenofovir (GENVOYA) 150-150-200-10 MG tablet 1 tablet     1 tablet Oral Daily with breakfast 03/16/15 0211     03/16/15 0800  sulfamethoxazole-trimethoprim (BACTRIM) 290 mg in dextrose 5 % 250 mL IVPB    Comments:  ~5mg /kg IV Q8H   290 mg 268.1 mL/hr over 60 Minutes Intravenous Every 8 hours 03/16/15 0211     03/15/15 2200  vancomycin (VANCOCIN) IVPB 1000 mg/200 mL premix  Status:  Discontinued     1,000 mg 200 mL/hr over 60 Minutes Intravenous  Once 03/15/15 2155 03/16/15 0211   03/15/15 2145  sulfamethoxazole-trimethoprim (BACTRIM) 320 mg in dextrose 5 % 500 mL IVPB     320 mg 346.7 mL/hr over 90 Minutes Intravenous  Once 03/15/15 2134 03/16/15 0134   03/15/15 2145  levofloxacin (LEVAQUIN) IVPB 750 mg     750 mg 100 mL/hr over 90 Minutes Intravenous  Once 03/15/15 2134 03/16/15 0134      Objective: Filed Weights   03/21/15 0526 03/21/15 1824 03/22/15 0532  Weight: 55.611 kg (122 lb 9.6 oz) 54.25 kg (119 lb 9.6 oz) 55.3 kg (121 lb 14.6 oz)    Intake/Output Summary (Last 24 hours) at 03/22/15 1248 Last data filed at 03/22/15 1226  Gross per 24 hour  Intake    960 ml  Output   2550 ml  Net  -1590 ml     Vitals Filed Vitals:   03/21/15 1600 03/21/15 1824 03/21/15 2217 03/22/15 0532  BP: 99/76 110/77 108/76 106/74  Pulse: 88 90 91 89  Temp: 97.4 F (36.3 C) 97.7 F (36.5 C) 97.7 F (36.5 C) 97.4 F (36.3 C)  TempSrc: Oral Oral Oral Oral  Resp: Height:  6' (1.829 m)    Weight:  54.25 kg (119 lb 9.6 oz)  55.3 kg (121 lb 14.6 oz)  SpO2: 91% 93% 93% 93%    Exam:  General:  Pt is alert, not in acute distress  HEENT: No icterus, No thrush, oral mucosa moist  Cardiovascular: regular rate and rhythm, S1/S2  No murmur  Respiratory: crackles bilaterally at bases and in mid lung fields    Abdomen: Soft, +Bowel sounds, non tender, non distended, no guarding  MSK: No LE edema, cyanosis or clubbing  Data Reviewed: Basic Metabolic Panel:  Recent Labs Lab 03/18/15 0443 03/19/15 0236 03/20/15 0222 03/21/15 0249 03/22/15 0702  NA 131* 132* 130* 130* 131*  K 4.2 4.2 5.0 4.6 4.7  CL 102 99* 98* 94* 91*  CO2 GLUCOSE 119* 125* 129* 119* 113*  BUN CREATININE 0.67 0.60* 0.65 0.74 0.74  CALCIUM 8.6* 8.9 9.1 9.2 9.6  Liver Function Tests:  Recent Labs Lab 03/15/15 2054  AST 22  ALT 22  ALKPHOS 93  BILITOT 1.2  PROT 7.3  ALBUMIN 3.3*    Recent Labs Lab 03/15/15 2054  LIPASE 21*   No results for input(s): AMMONIA in the last 168 hours. CBC:  Recent Labs Lab 03/15/15 2054 03/16/15 0346 03/17/15 1225 03/18/15 0443  WBC 10.7* 7.3 12.4* 6.4  HGB 14.6 11.7* 12.6* 12.2*  HCT 42.2 34.4* 35.3* 36.0*  MCV 71.0* 70.3* 69.2* 70.3*  PLT 368 293 371 350   Cardiac Enzymes: No results for input(s): CKTOTAL, CKMB, CKMBINDEX, TROPONINI in the last 168 hours. BNP (last 3 results) No results for input(s): BNP in the last 8760 hours.  ProBNP (last 3 results) No results for input(s): PROBNP in the last 8760 hours.  CBG:  Recent Labs Lab 03/21/15 1142 03/21/15 1623 03/21/15 2220 03/22/15 0802 03/22/15 1202  GLUCAP 139* 131* 178* 129* 101*    Recent Results (from the past 240 hour(s))  Blood culture (routine x 2)     Status: None   Collection Time: 03/15/15 10:05 PM  Result Value Ref Range Status   Specimen Description BLOOD RIGHT ARM  Final   Special Requests BOTTLES DRAWN AEROBIC AND ANAEROBIC 5CC  Final   Culture NO GROWTH 5 DAYS  Final   Report Status 03/21/2015 FINAL  Final  Blood culture (routine x 2)     Status: None   Collection Time: 03/15/15 10:23 PM  Result Value Ref Range Status   Specimen Description BLOOD RIGHT FOREARM  Final    Special Requests BOTTLES DRAWN AEROBIC AND ANAEROBIC  Final   Culture NO GROWTH 5 DAYS  Final   Report Status 03/21/2015 FINAL  Final  MRSA PCR Screening     Status: None   Collection Time: 03/16/15  4:39 PM  Result Value Ref Range Status   MRSA by PCR NEGATIVE NEGATIVE Final    Comment:        The GeneXpert MRSA Assay (FDA approved for NASAL specimens only), is one component of a comprehensive MRSA colonization surveillance program. It is not intended to diagnose MRSA infection nor to guide or monitor treatment for MRSA infections.   Culture, expectorated sputum-assessment     Status: None   Collection Time: 03/17/15  3:27 PM  Result Value Ref Range Status   Specimen Description SPUTUM  Final   Special Requests Immunocompromised  Final   Sputum evaluation   Final    THIS SPECIMEN IS ACCEPTABLE. RESPIRATORY CULTURE REPORT TO FOLLOW.   Report Status 03/17/2015 FINAL  Final  Culture, respiratory (NON-Expectorated)     Status: None   Collection Time: 03/17/15  3:27 PM  Result Value Ref Range Status   Specimen Description SPUTUM  Final   Special Requests NONE  Final   Gram Stain   Final    MODERATE WBC PRESENT,BOTH PMN AND MONONUCLEAR NO SQUAMOUS EPITHELIAL CELLS SEEN NO ORGANISMS SEEN Performed at [facility]    Culture   Final    NORMAL OROPHARYNGEAL FLORA Performed at [facility]    Report Status 03/20/2015 FINAL  Final  Pneumocystis smear by DFA     Status: None   Collection Time: 03/17/15  3:28 PM  Result Value Ref Range Status   Specimen Source-PJSRC SPUTUM  Final   Pneumocystis jiroveci Ag NEGATIVE  Final    Comment: Performed at [facility]  AFB culture with smear  Status: None (Preliminary result)   Collection Time: 03/17/15  3:29 PM  Result Value Ref Range Status   Specimen Description SPUTUM  Final   Special Requests Immunocompromised  Final   Acid Fast Smear   Final    NO ACID FAST BACILLI SEEN Performed at  [facility]    Culture   Final    CULTURE WILL BE EXAMINED FOR 6 WEEKS BEFORE ISSUING A FINAL REPORT Performed at [facility]    Report Status PENDING  Incomplete     Studies: No results found.  Scheduled Meds:  Scheduled Meds: . antiseptic oral rinse  7 mL Mouth Rinse BID  . azithromycin  1,200 mg Oral Weekly  . chlorpheniramine-HYDROcodone  5 mL Oral Q12H  . elvitegravir-cobicistat-emtricitabine-tenofovir  1 tablet Oral Q breakfast  . famotidine  20 mg Oral BID  . feeding supplement (GLUCERNA SHAKE)  237 mL Oral TID BM  . fluconazole  200 mg Oral Daily  . folic acid  1 mg Oral Daily  . heparin  5,000 Units Subcutaneous 3 times per day  . insulin aspart  0-5 Units Subcutaneous QHS  . insulin aspart  0-9 Units Subcutaneous TID WC  . multivitamin with minerals  1 tablet Oral Daily  . pantoprazole  40 mg Oral Daily  . predniSONE  40 mg Oral BID WC  . sodium chloride  3 mL Intravenous Q12H  . sulfamethoxazole-trimethoprim  290 mg Intravenous Q8H  . thiamine  100 mg Oral Daily   Continuous Infusions:    Time spent on care of this patient: 35 min   Jordis Repetto A, MD 03/22/2015, 12:48 PM  LOS: 7 days   [facility] Office  [phone] Pager - Text Page per [email or web address] If 7PM-7AM, please contact night-coverage [email or web address]

## 2015-03-22 NOTE — Progress Notes (Signed)
Utilization review completed.  

## 2015-03-23 LAB — BASIC METABOLIC PANEL
ANION GAP: 12 (ref 5–15)
BUN: 15 mg/dL (ref 6–20)
CALCIUM: 9.2 mg/dL (ref 8.9–10.3)
CO2: 25 mmol/L (ref 22–32)
CREATININE: 0.87 mg/dL (ref 0.61–1.24)
Chloride: 93 mmol/L — ABNORMAL LOW (ref 101–111)
GFR calc Af Amer: >60 mL/min (ref >60)
GFR calc non Af Amer: >60 mL/min (ref >60)
GLUCOSE: 100 mg/dL — AB (ref 65–99)
Potassium: 4.4 mmol/L (ref 3.5–5.1)
Sodium: 130 mmol/L — ABNORMAL LOW (ref 135–145)

## 2015-03-23 LAB — GLUCOSE, CAPILLARY
Glucose-Capillary: 106 mg/dL — ABNORMAL HIGH (ref 65–99)
Glucose-Capillary: 171 mg/dL — ABNORMAL HIGH (ref 65–99)

## 2015-03-23 MED ORDER — AZITHROMYCIN 600 MG PO TABS: 1200.0000 mg | ORAL_TABLET | ORAL | Status: DC

## 2015-03-23 MED ORDER — SULFAMETHOXAZOLE-TRIMETHOPRIM 400-80 MG PO TABS: 2.0000 | ORAL_TABLET | Freq: Three times a day (TID) | ORAL | Status: DC

## 2015-03-23 MED ORDER — SULFAMETHOXAZOLE-TRIMETHOPRIM 800-160 MG PO TABS
2.0000 | ORAL_TABLET | Freq: Three times a day (TID) | ORAL | Status: DC
  Filled 2015-03-23: qty 2

## 2015-03-23 MED ORDER — PREDNISONE 20 MG PO TABS: ORAL_TABLET | ORAL | Status: DC

## 2015-03-23 MED ORDER — ELVITEG-COBIC-EMTRICIT-TENOFAF 150-150-200-10 MG PO TABS: 1.0000 | ORAL_TABLET | Freq: Every day | ORAL | Status: DC

## 2015-03-23 MED ORDER — SULFAMETHOXAZOLE-TRIMETHOPRIM 400-80 MG PO TABS: 1.0000 | ORAL_TABLET | Freq: Three times a day (TID) | ORAL | Status: DC

## 2015-03-23 MED ORDER — FLUCONAZOLE 200 MG PO TABS
200.0000 mg | ORAL_TABLET | Freq: Every day | ORAL | Status: DC
Start: 2015-03-23 — End: 2015-06-23

## 2015-03-23 NOTE — Progress Notes (Signed)
[facility] Progress Note   Jon Love  ZOX:[id number]  DOB: [birth year]  DOA: 03/15/2015 PCP: No PCP Per Patient  Brief narrative: Jon Love is a 39 y.o. male with AIDS who presents to the hospital for shortness of breath and cough for 6 days. He also complains of mid abdominal pain and vomiting. No complaint of diarrhea. He admits that he has not been yet been able to obtain his HIV meds and therefore has not been taking any. In the ER a chest x-ray revealed bilateral infiltrates and he was noted to be hypoxic with oxygen saturations in the mid 80s. Appears that patient has language and illiteracy barrier, interpreter used for extensive explanation of which going on with him right now.   Subjective: Feels much better today, on 2 L of oxygen. I will ambulate around the hallway. Anticipate discharge in the morning. Per ID, patient will get his ARV meds from the ID clinic. Case management to help with medications.  Assessment/Plan: Principal Problem:   Bilateral Pneumonia/acute resp failure - CXR reveals b/l reticulonodular infiltrates - strep antigen positive -   Levofloxacin started on 10/6 - continue or 7 day course per ID (d/w Dr Drue Second today) -High risk for PCP pneumonia-started on Bactrim and prednisone- PCP smear pending -Legionella antigen Neg -Continues to require 6 L of O2 at rest which is keeping his pulse ox at 88-90% -Started Tussionex for cough -Transferred to regular bed, will try to wean the oxygen further. -Continue Bactrim and and decrease steroids to 40 mg daily.    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   AIDS (acquired immunodeficiency syndrome), CD4 <=200 (HCC) - Per ID notes, the patient has not followed up with ID to obtain medications -CD4 count is 10 -started anti-retrovirals - ID consulted and also following-  Zithromax added for prophylaxis   Dysphagia/ odynophagia  - possibly has esophageal candida- cont fluconazole which was started by ID on 10/8-ID recommended 14 day course - may have GERD/ esophagitis/ Gastritis esp as he is now on high dose Prednisone - started Protonix and Pepcid on 10/10 - symptoms slightly improved after starting PPI  Diabetes mellitus -Hemoglobin A1c mildly elevated at 6.3- provided teaching regarding diabetes- dietician consulted to instruct on diabetic diet   Code Status:     Code Status Orders        Start     Ordered   03/16/15 0212  Full code   Continuous     03/16/15 0211     Family Communication:  Disposition Plan: Continue antibiotics; cont to follow in SDU until O2 requirements improve DVT prophylaxis: Heparin Consultants: Infectious disease Procedures:  Antibiotics: Anti-infectives    Start     Dose/Rate Route Frequency Ordered Stop   03/23/15 1400  sulfamethoxazole-trimethoprim (BACTRIM DS,SEPTRA DS) 800-160 MG per tablet 2 tablet     2 tablet Oral 3 times per day 03/23/15 1055     03/21/15 2200  azithromycin (ZITHROMAX) tablet 1,200 mg     1,200 mg Oral Weekly 03/20/15 1009     03/21/15 1200  azithromycin (ZITHROMAX) tablet 1,200 mg  Status:  Discontinued     1,200 mg  Oral Weekly 03/20/15 1006 03/20/15 1009   03/20/15 1500  fluconazole (DIFLUCAN) tablet 200 mg     200 mg Oral Daily 03/20/15 1017     03/20/15 1400  azithromycin (ZITHROMAX) tablet 1,200 mg  Status:  Discontinued     1,200 mg Oral Weekly 03/20/15 1000 03/20/15 1006   03/17/15 1530  fluconazole (DIFLUCAN) IVPB 200 mg  Status:  Discontinued     200 mg 100 mL/hr over 60 Minutes Intravenous Every 24 hours 03/17/15 1504 03/20/15 1017   03/16/15 2300   levofloxacin (LEVAQUIN) IVPB 750 mg  Status:  Discontinued     750 mg 100 mL/hr over 90 Minutes Intravenous Every 24 hours 03/16/15 0211 03/16/15 1622   03/16/15 2200  levofloxacin (LEVAQUIN) IVPB 750 mg     750 mg 100 mL/hr over 90 Minutes Intravenous Every 24 hours 03/16/15 1622 03/20/15 2318   03/16/15 0800  elvitegravir-cobicistat-emtricitabine-tenofovir (GENVOYA) 150-150-200-10 MG tablet 1 tablet     1 tablet Oral Daily with breakfast 03/16/15 0211     03/16/15 0800  sulfamethoxazole-trimethoprim (BACTRIM) 290 mg in dextrose 5 % 250 mL IVPB  Status:  Discontinued    Comments:  ~5mg /kg IV Q8H   290 mg 268.1 mL/hr over 60 Minutes Intravenous Every 8 hours 03/16/15 0211 03/23/15 1055   03/15/15 2200  vancomycin (VANCOCIN) IVPB 1000 mg/200 mL premix  Status:  Discontinued     1,000 mg 200 mL/hr over 60 Minutes Intravenous  Once 03/15/15 2155 03/16/15 0211   03/15/15 2145  sulfamethoxazole-trimethoprim (BACTRIM) 320 mg in dextrose 5 % 500 mL IVPB     320 mg 346.7 mL/hr over 90 Minutes Intravenous  Once 03/15/15 2134 03/16/15 0134   03/15/15 2145  levofloxacin (LEVAQUIN) IVPB 750 mg     750 mg 100 mL/hr over 90 Minutes Intravenous  Once 03/15/15 2134 03/16/15 0134      Objective: Filed Weights   03/22/15 0532 03/23/15 0500 03/23/15 0558  Weight: 55.3 kg (121 lb 14.6 oz) 55.2 kg (121 lb 11.1 oz) 55.2 kg (121 lb 11.1 oz)    Intake/Output Summary (Last 24 hours) at 03/23/15 1358 Last data filed at 03/23/15 0847  Gross per 24 hour  Intake    800 ml  Output   2150 ml  Net  -1350 ml     Vitals Filed Vitals:   03/22/15 1455 03/22/15 2127 03/23/15 0500 03/23/15 0558  BP: 111/80 105/73  104/74  Pulse: 102 77  82  Temp: 97.7 F (36.5 C) 98 F (36.7 C)  97.8 F (36.6 C)  TempSrc: Oral Oral  Oral  Resp: Height:      Weight:   55.2 kg (121 lb 11.1 oz) 55.2 kg (121 lb 11.1 oz)  SpO2: 96% 95%  97%    Exam:  General:  Jon Love is alert, not in acute distress  HEENT: No  icterus, No thrush, oral mucosa moist  Cardiovascular: regular rate and rhythm, S1/S2 No murmur  Respiratory: crackles bilaterally at bases and in mid lung fields    Abdomen: Soft, +Bowel sounds, non tender, non distended, no guarding  MSK: No LE edema, cyanosis or clubbing  Data Reviewed: Basic Metabolic Panel:  Recent Labs Lab 03/19/15 0236 03/20/15 0222 03/21/15 0249 03/22/15 0702 03/23/15 0526  NA 132* 130* 130* 131* 130*  K 4.2 5.0 4.6 4.7 4.4  CL 99* 98* 94* 91* 93*  CO2 GLUCOSE 125* 129* 119* 113* 100*  BUN 9  12 12 14 15   CREATININE 0.60* 0.65 0.74 0.74 0.87  CALCIUM 8.9 9.1 9.2 9.6 9.2   Liver Function Tests: No results for input(s): AST, ALT, ALKPHOS, BILITOT, PROT, ALBUMIN in the last 168 hours. No results for input(s): LIPASE, AMYLASE in the last 168 hours. No results for input(s): AMMONIA in the last 168 hours. CBC:  Recent Labs Lab 03/17/15 1225 03/18/15 0443  WBC 12.4* 6.4  HGB 12.6* 12.2*  HCT 35.3* 36.0*  MCV 69.2* 70.3*  PLT 371 350   Cardiac Enzymes: No results for input(s): CKTOTAL, CKMB, CKMBINDEX, TROPONINI in the last 168 hours. BNP (last 3 results) No results for input(s): BNP in the last 8760 hours.  ProBNP (last 3 results) No results for input(s): PROBNP in the last 8760 hours.  CBG:  Recent Labs Lab 03/22/15 1202 03/22/15 1725 03/22/15 2126 03/23/15 0812 03/23/15 1221  GLUCAP 101* 144* 130* 106* 171*    Recent Results (from the past 240 hour(s))  Blood culture (routine x 2)     Status: None   Collection Time: 03/15/15 10:05 PM  Result Value Ref Range Status   Specimen Description BLOOD RIGHT ARM  Final   Special Requests BOTTLES DRAWN AEROBIC AND ANAEROBIC 5CC  Final   Culture NO GROWTH 5 DAYS  Final   Report Status 03/21/2015 FINAL  Final  Blood culture (routine x 2)     Status: None   Collection Time: 03/15/15 10:23 PM  Result Value Ref Range Status   Specimen Description BLOOD RIGHT FOREARM  Final    Special Requests BOTTLES DRAWN AEROBIC AND ANAEROBIC 5ML  Final   Culture NO GROWTH 5 DAYS  Final   Report Status 03/21/2015 FINAL  Final  MRSA PCR Screening     Status: None   Collection Time: 03/16/15  4:39 PM  Result Value Ref Range Status   MRSA by PCR NEGATIVE NEGATIVE Final    Comment:        The GeneXpert MRSA Assay (FDA approved for NASAL specimens only), is one component of a comprehensive MRSA colonization surveillance program. It is not intended to diagnose MRSA infection nor to guide or monitor treatment for MRSA infections.   Culture, expectorated sputum-assessment     Status: None   Collection Time: 03/17/15  3:27 PM  Result Value Ref Range Status   Specimen Description SPUTUM  Final   Special Requests Immunocompromised  Final   Sputum evaluation   Final    THIS SPECIMEN IS ACCEPTABLE. RESPIRATORY CULTURE REPORT TO FOLLOW.   Report Status 03/17/2015 FINAL  Final  Culture, respiratory (NON-Expectorated)     Status: None   Collection Time: 03/17/15  3:27 PM  Result Value Ref Range Status   Specimen Description SPUTUM  Final   Special Requests NONE  Final   Gram Stain   Final    MODERATE WBC PRESENT,BOTH PMN AND MONONUCLEAR NO SQUAMOUS EPITHELIAL CELLS SEEN NO ORGANISMS SEEN Performed at [facility]    Culture   Final    NORMAL OROPHARYNGEAL FLORA Performed at [facility]    Report Status 03/20/2015 FINAL  Final  Pneumocystis smear by DFA     Status: None   Collection Time: 03/17/15  3:28 PM  Result Value Ref Range Status   Specimen Source-PJSRC SPUTUM  Final   Pneumocystis jiroveci Ag NEGATIVE  Final    Comment: [facility]formed [facility]  AFB culture with smear     Status: None (Preliminary result)  Collection Time: 03/17/15  3:29 PM  Result Value Ref Range Status   Specimen Description SPUTUM  Final   Special Requests Immunocompromised  Final   Acid Fast Smear   Final    NO ACID FAST BACILLI SEEN Performed at  [facility]    Culture   Final    CULTURE WILL BE EXAMINED FOR 6 WEEKS BEFORE ISSUING A FINAL REPORT Performed at [facility]    Report Status PENDING  Incomplete     Studies: No results found.  Scheduled Meds:  Scheduled Meds: . antiseptic oral rinse  7 mL Mouth Rinse BID  . azithromycin  1,200 mg Oral Weekly  . chlorpheniramine-HYDROcodone  5 mL Oral Q12H  . elvitegravir-cobicistat-emtricitabine-tenofovir  1 tablet Oral Q breakfast  . famotidine  20 mg Oral BID  . feeding supplement (GLUCERNA SHAKE)  237 mL Oral TID BM  . fluconazole  200 mg Oral Daily  . folic acid  1 mg Oral Daily  . heparin  5,000 Units Subcutaneous 3 times per day  . insulin aspart  0-5 Units Subcutaneous QHS  . insulin aspart  0-9 Units Subcutaneous TID WC  . multivitamin with minerals  1 tablet Oral Daily  . pantoprazole  40 mg Oral Daily  . predniSONE  40 mg Oral Q breakfast  . sodium chloride  3 mL Intravenous Q12H  . sulfamethoxazole-trimethoprim  2 tablet Oral 3 times per day  . thiamine  100 mg Oral Daily   Continuous Infusions:    Time spent on care of this patient: 35 min   Iness Pangilinan A, MD 03/23/2015, 1:58 PM  LOS: 8 days   [facility] Office  [phone] Pager - Text Page per [email or web address] If 7PM-7AM, please contact night-coverage [email or web address]

## 2015-03-23 NOTE — Progress Notes (Signed)
Interpreter Wyvonnia DuskyGraciela Namihira for [facility] care management.

## 2015-03-23 NOTE — Progress Notes (Signed)
Report given to oncoming RN.

## 2015-03-23 NOTE — Progress Notes (Signed)
SATURATION QUALIFICATIONS: (This note is used to comply with regulatory documentation for home oxygen)  Patient Saturations on Room Air at Rest = 97%  Patient Saturations on Room Air while Ambulating = 91%  O2 left off.  Please briefly explain why patient needs home oxygen: not needed

## 2015-03-23 NOTE — Progress Notes (Signed)
LM with Bjorn Loserhonda financial counselor to update address to [address] [place] [id number] and phone number to [phone].

## 2015-03-23 NOTE — Progress Notes (Signed)
Alondra Boones MillHernandez discharged Home with friends per MD order.  Discharge instructions reviewed and discussed with the patient using Graciela to interpret, all questions and concerns answered. Copy of instructions and scripts given to patient, as well as medication from Dr. Drue SecondSnider    Medication List    TAKE these medications        azithromycin 600 MG tablet  Commonly known as:  ZITHROMAX  Take 2 tablets (1,200 mg total) by mouth once a week. On Mondays     bismuth subsalicylate 262 MG/15ML suspension  Commonly known as:  PEPTO BISMOL  Take 30 mLs by mouth every 6 (six) hours as needed for indigestion or diarrhea or loose stools.     elvitegravir-cobicistat-emtricitabine-tenofovir 150-150-200-10 MG Tabs tablet  Commonly known as:  GENVOYA  Take 1 tablet by mouth daily with breakfast.     fluconazole 200 MG tablet  Commonly known as:  DIFLUCAN  Take 1 tablet (200 mg total) by mouth daily.     predniSONE 20 MG tablet  Commonly known as:  DELTASONE  Take 2 tablets PO daily for 7 day, then Take 1 tablets PO daily for 7 day till gone     sulfamethoxazole-trimethoprim 400-80 MG tablet  Commonly known as:  BACTRIM,SEPTRA  Take 2 tablets by mouth 3 (three) times daily.        Patients skin is clean, dry and intact, no evidence of skin break down. IV site discontinued and catheter remains intact. Site without signs and symptoms of complications. Dressing and pressure applied.  Patient escorted to car by NT in a wheelchair,  no distress noted upon discharge.  Laural BenesJohnson, Bryona Foxworthy C 03/23/2015 3:40 PM

## 2015-03-23 NOTE — Progress Notes (Signed)
[facility]    Date of Admission:  03/15/2015   Total days of antibiotics 8        Day 8 bactrim        Day 6 levo - finished        Day 6 fluc   ID: Jon Love is a 39 y.o. 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 antiseptic oral rinse  7 mL Mouth Rinse BID  . azithromycin  1,200 mg Oral Weekly  . chlorpheniramine-HYDROcodone  5 mL Oral Q12H  . elvitegravir-cobicistat-emtricitabine-tenofovir  1 tablet Oral Q breakfast  . famotidine  20 mg Oral BID  . feeding supplement (GLUCERNA SHAKE)  237 mL Oral TID BM  . fluconazole  200 mg Oral Daily  . folic acid  1 mg Oral Daily  . heparin  5,000 Units Subcutaneous 3 times per day  . insulin aspart  0-5 Units Subcutaneous QHS  . insulin aspart  0-9 Units Subcutaneous TID WC  . multivitamin with minerals  1 tablet Oral Daily  . pantoprazole  40 mg Oral Daily  . predniSONE  40 mg Oral Q breakfast  . sodium chloride  3 mL Intravenous Q12H  . sulfamethoxazole-trimethoprim  2 tablet Oral 3 times per day  . thiamine  100 mg Oral Daily    Objective: Vital signs in last 24 hours: Temp:  [97.7 F (36.5 C)-98 F (36.7 C)] 97.8 F (36.6 C) (10/14 0558) Pulse Rate:  [77-102] 82 (10/14 0558) Resp:  [14-16] 15 (10/14 0558) BP: (104-111)/(73-80) 104/74 mmHg (10/14 0558) SpO2:  [95 %-97 %] 97 % (10/14 0558) Weight:  [121 lb 11.1 oz (55.2 kg)] 121 lb 11.1 oz (55.2 kg) (10/14 16100558)  Physical Exam  Constitutional: He is oriented to person, place, and time. He appears cachetic. No distress.  HENT: +thrush Mouth/Throat: Oropharynx is clear and moist. No oropharyngeal  exudate.  Cardiovascular: Normal rate, regular rhythm and normal heart sounds. Exam reveals no gallop and no friction rub.  No murmur heard.  Pulmonary/Chest: Effort normal and breath sounds normal. desats with movement. Inspiratory wheezing at bases Abdominal: Soft. Bowel sounds are normal. He exhibits no distension. There is no tenderness.  Lymphadenopathy:  He has no cervical adenopathy.  Neurological: He is alert and oriented to person, place, and time.  Skin: Skin is warm and dry. No rash noted. No erythema.  Psychiatric: He has a normal mood and affect. His behavior is normal.     Lab Results  Recent Labs  03/22/15 0702 03/23/15 0526  NA 131* 130*  K 4.7 4.4  CL 91* 93*  CO2 27 25  BUN 14 15  CREATININE 0.74 0.87   Studies/Results: Strep pneumo ur antigen + PCP dfa negative  Assessment/Plan: Strep pneumo PNA = finished a course of levofloxacin   PCP PNA = will switch over to oral regimen. He needs bactrim DS 2 tabs TID x 14 more days, followed by 1 tab daily thereafter. Please prescribed bactrim DS #90 tabs.   For prednisone. Pred 20mg  tab, take 2 tabs daily x 7 days, then 1 tab daily x 7 days  Advanced hiv =  continue on genvoya. Viral load and genotype pending. We will provide him with HIV medication to take home so that he can continue medications (Stribild), until he sees [place] in the HIV clinic  oi proph = start azithromycin  Q wk. Please prescribe azithromycin  tab, take 2 tabs every Wednesday ( please dispense 8 tabs)  Oral thrush = continue on fluconazole  daily, currently will need 7 more days of treatment. Please prescribe fluconazole  daily ( 7 tabs)  GERD = continue with PPI  Will set up appointment in the ID clinic in 2 wk   Geroge Gilliam, [facility] Cell: [phone] Pager: [phone]  03/23/2015, 1:27 PM

## 2015-03-23 NOTE — Discharge Summary (Signed)
Physician Discharge Summary  Jon Love NWG:[id number] DOB: 1976/02/27 DOA: 03/15/2015  PCP: No PCP Per Patient  Admit date: 03/15/2015 Discharge date: 03/23/2015  Time spent: 40 minutes  Recommendations for Outpatient Follow-up:  1. Follow up with the regional ID clinic, Dr. Drue Second in 7-10 days. 2. Bactrim DS 2 tablets by mouth 3 times a day for 14 more days, then 1 tablet by mouth daily after 3. Prednisone 40 mg by mouth daily for 1 week, then 20 mg by mouth daily for another week. 4. Diflucan 200 mg by mouth daily for 7 more days. 5. Azithromycin 1200 mg every week  Discharge Diagnoses:  Principal Problem:   Streptococcal pneumonia (HCC) Active Problems:   Hyponatremia   AIDS (acquired immunodeficiency syndrome), CD4 <=200 (HCC)   Acute respiratory failure (HCC)   Discharge Condition: Stable  Diet recommendation: Heart healthy  Filed Weights   03/22/15 0532 03/23/15 0500 03/23/15 0558  Weight: 55.3 kg (121 lb 14.6 oz) 55.2 kg (121 lb 11.1 oz) 55.2 kg (121 lb 11.1 oz)    History of present illness:  Jon Love is a 39 y.o. male with history of HIV AIDS presents with shortness of breath. Patient has a history of HIV and has not been compliant with his medications. Patient was last seen in the ID clinic on 12/18/14. He has not been taking HAART therapy. He presents with increasing shortness of breath and also has been having a cough. Patient in association with this has had abdominal pain and nausea and vomiting. Patient has also been having fevers. In the ED he had a CXR shows bilateral infiltrates consistent with pneumonitis. His last CD4 count was done in May which was 10 and there has not been a follow up count done. Patietn was also ntoed to be hypoxic in the ED with saturations in the mid 80s   Hospital Course:   Bilateral Pneumonia - CXR reveals b/l reticulonodular infiltrates - strep antigen positive - Levofloxacin discontinued after 7 days. -High risk  for PCP pneumonia-started on Bactrim and prednisone- PCP smear pending -Legionella antigen Neg -Continues to require 6 L of O2 at rest which is keeping his pulse ox at 88-90% -ID recommended Bactrim DS 3 times a day for 14 more days. And taper the prednisone down over 2 weeks.  Acute respiratory failure -Acute respiratory failure with hypoxia likely secondary to PCP pneumonia plus Streptococcus pneumonia. -Patient presented with bilateral pneumonia, his oxygen saturation was in the 80s. -Initially required about 6 L of oxygen to keep the saturation in between 88-90%. -Currently he is on room air, oxygen saturation is 91% after ambulation. This is resolved.   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  AIDS (acquired immunodeficiency syndrome), CD4 <=200 (HCC) - Per ID notes, the patient has not followed up with ID to obtain medications -CD4 count is 10 -Started on antiretrovirals, medications given to the patient by Dr. Drue Second (in his room daily follows with them in the clinic)  Dysphagia/ odynophagia  - possibly has esophageal candida- cont fluconazole which was started by ID on 10/8-ID recommended 14 day course - may have GERD/ esophagitis/ Gastritis esp as he is now on high dose Prednisone - started Protonix and Pepcid on 10/10 - Continue Diflucan for  7 more days.  Diabetes mellitus -Hemoglobin A1c mildly elevated at 6.3- provided teaching regarding diabetes- dietician consulted to instruct on diabetic diet   Code Status:   Procedures:  None  Consultations:  ID  PCCM  Discharge Exam: Filed Vitals:   03/23/15 0558  BP: 104/74  Pulse: 82  Temp: 97.8 F (36.6 C)  Resp: 15   General:  Alert and awake, oriented x3, not in any acute distress. HEENT: anicteric sclera, pupils reactive to light and accommodation, EOMI CVS: S1-S2 clear, no murmur rubs or gallops Chest: clear to auscultation bilaterally, no wheezing, rales or rhonchi Abdomen: soft nontender, nondistended, normal bowel sounds, no organomegaly Extremities: no cyanosis, clubbing or edema noted bilaterally Neuro: Cranial nerves II-XII intact, no focal neurological deficits  Discharge Instructions   Discharge Instructions    Diet - low sodium heart healthy    Complete by:  As directed      Increase activity slowly    Complete by:  As directed           Current Discharge Medication List    START taking these medications   Details  fluconazole (DIFLUCAN) 200 MG tablet Take 1 tablet (200 mg total) by mouth daily. Qty: 7 tablet, Refills: 0    predniSONE (DELTASONE) 20 MG tablet Take 2 tablets PO daily for 7 day, then Take 1 tablets PO daily for 7 day till gone Qty: 21 tablet, Refills: 0      CONTINUE these medications which have CHANGED   Details  azithromycin (ZITHROMAX) 600 MG tablet Take 2 tablets (1,200 mg total) by mouth once a week. On Mondays Qty: 8 tablet, Refills: 6   Associated Diagnoses: AIDS (acquired immunodeficiency syndrome), CD4 <=200 (HCC)    elvitegravir-cobicistat-emtricitabine-tenofovir (GENVOYA) 150-150-200-10 MG TABS tablet Take 1 tablet by mouth daily with breakfast. Qty: 30 tablet, Refills: 5   Associated Diagnoses: Acquired immune deficiency syndrome (HCC)    sulfamethoxazole-trimethoprim (BACTRIM,SEPTRA) 400-80 MG tablet Take 2 tablets by mouth 3 (three) times daily. Qty: 90 tablet, Refills: 0   Associated Diagnoses: AIDS (acquired immunodeficiency syndrome), CD4 <=200 (HCC)      CONTINUE these medications which have NOT CHANGED   Details  bismuth subsalicylate (PEPTO BISMOL) 262 MG/15ML suspension Take 30 mLs by mouth every 6 (six) hours as needed for indigestion or  diarrhea or loose stools.       Allergies  Allergen Reactions  . Penicillins Hives       Follow-up Information    Follow up with  [facility]     On 03/30/2015.   Why:  at 11:45. YOU MUST GO TO THIS APPOINTMENT OR THEY WILL NOT SCHEDULE YOU AGAIN, OR GIVE YOU MEDICATION.   Contact information:   [address] [place] [address] [phone]      Follow up with [facility] letter.   Contact information:   Take this to one of the listed pharmacies to get your Rx Filled.       Follow up with Judyann Munson, MD In 2 weeks.   Specialty:  Infectious Diseases   Why:  office will call you   Contact information:   [address] [place] [address] [phone]        The results of significant diagnostics from this hospitalization (including imaging, microbiology, ancillary and laboratory) are listed below for reference.    Significant Diagnostic Studies: Dg Chest 2 View  03/15/2015  CLINICAL DATA:  Productive cough for 2 weeks  EXAM: CHEST - 2 VIEW COMPARISON:  10/10/2014 FINDINGS: Cardiac shadow is within normal limits. Bibasilar infiltrates are noted right greater than left in the lower lobes. No sizable effusion is noted. No acute bony abnormality is seen. Bilateral nipple shadows are noted. IMPRESSION: Bilateral lower lobe infiltrates right greater than left. Electronically Signed   By: Alcide CleverMark  Lukens M.D.   On: 03/15/2015 21:52   Ct Abdomen Pelvis W Contrast  03/16/2015  CLINICAL DATA:  Acute onset of diffuse mid abdominal pain, extending to the right. Current history of HIV. Initial encounter. EXAM: CT ABDOMEN AND PELVIS WITH CONTRAST TECHNIQUE: Multidetector CT imaging of the abdomen and pelvis was performed using the standard protocol following bolus administration of intravenous contrast. CONTRAST:  100mL OMNIPAQUE IOHEXOL 300 MG/ML  SOLN COMPARISON:  CT of the abdomen and pelvis performed 10/06/2014, and MRI/MRA of  the chest, abdomen and pelvis performed 10/07/2014 FINDINGS: Patchy bibasilar airspace opacities are noted, with underlying bronchiectasis. The appearance is suspicious for atypical infection. Mild wall thickening is noted at the distal esophagus, likely reflecting chronic inflammation. The liver is unremarkable in appearance. The spleen is mildly enlarged, measuring 14.1 cm in length. The patient is status post cholecystectomy, with clips noted along the gallbladder fossa. The pancreas and adrenal glands are unremarkable. The kidneys are unremarkable in appearance. There is no evidence of hydronephrosis. No renal or ureteral stones are seen. No perinephric stranding is appreciated. No free fluid is identified. The small bowel is unremarkable in appearance. The stomach is within normal limits. No acute vascular abnormalities are seen. The appendix contains trace residual contrast. It is grossly unremarkable in appearance. The colon is partially filled with fluid. There is slightly increased colonic wall enhancement along much of its length, raising question for a mild infectious or inflammatory process. The bladder is mildly distended and grossly unremarkable. The prostate remains normal in size. No inguinal lymphadenopathy is seen. No acute osseous abnormalities are identified. IMPRESSION: 1. Patchy bibasilar airspace opacities, with underlying bronchiectasis. The appearance is suspicious for atypical lung infection. 2. Colon partially filled with fluid, with slightly increased colonic wall enhancement along much of its length, raising question for a mild infectious or inflammatory process. 3. Mild wall thickening at the distal esophagus, likely reflecting chronic inflammation. 4. Mild splenomegaly. Electronically Signed   By: Roanna RaiderJeffery  Chang M.D.   On: 03/16/2015 00:29   Dg Chest Port 1 View  03/17/2015  CLINICAL DATA:  Shortness of breath and cough, 6 days duration. HIV patient. EXAM: PORTABLE CHEST 1 VIEW  COMPARISON:  03/15/2015 FINDINGS: Heart size is normal. Mediastinal shadows are normal. Patchy and reticular nodular shadowing throughout both lungs is improved since 2 days ago consistent with improving pneumonia. No area of dense consolidation or collapse. No effusions. Bony structures unremarkable. IMPRESSION: Improvement in the appearance of patchy and reticulonodular infiltrate shadows throughout the lungs that were demonstrated 2 days ago. No worsening or new findings. Electronically Signed   By: Paulina FusiMark  Shogry M.D.   On: 03/17/2015 12:56    Microbiology: Recent Results (from the past 240 hour(s))  Blood culture (routine x 2)     Status: None   Collection Time: 03/15/15 10:05 PM  Result Value Ref Range Status   Specimen Description BLOOD RIGHT ARM  Final   Special Requests BOTTLES DRAWN AEROBIC AND ANAEROBIC 5CC  Final   Culture NO GROWTH 5 DAYS  Final   Report Status 03/21/2015 FINAL  Final  Blood culture (routine x 2)     Status:  None   Collection Time: 03/15/15 10:23 PM  Result Value Ref Range Status   Specimen Description BLOOD RIGHT FOREARM  Final   Special Requests BOTTLES DRAWN AEROBIC AND ANAEROBIC  Final   Culture NO GROWTH 5 DAYS  Final   Report Status 03/21/2015 FINAL  Final  MRSA PCR Screening     Status: None   Collection Time: 03/16/15  4:39 PM  Result Value Ref Range Status   MRSA by PCR NEGATIVE NEGATIVE Final    Comment:        The GeneXpert MRSA Assay (FDA approved for NASAL specimens only), is one component of a comprehensive MRSA colonization surveillance program. It is not intended to diagnose MRSA infection nor to guide or monitor treatment for MRSA infections.   Culture, expectorated sputum-assessment     Status: None   Collection Time: 03/17/15  3:27 PM  Result Value Ref Range Status   Specimen Description SPUTUM  Final   Special Requests Immunocompromised  Final   Sputum evaluation   Final    THIS SPECIMEN IS ACCEPTABLE. RESPIRATORY CULTURE  REPORT TO FOLLOW.   Report Status 03/17/2015 FINAL  Final  Culture, respiratory (NON-Expectorated)     Status: None   Collection Time: 03/17/15  3:27 PM  Result Value Ref Range Status   Specimen Description SPUTUM  Final   Special Requests NONE  Final   Gram Stain   Final    MODERATE WBC PRESENT,BOTH PMN AND MONONUCLEAR NO SQUAMOUS EPITHELIAL CELLS SEEN NO ORGANISMS SEEN Performed at [facility]    Culture   Final    NORMAL OROPHARYNGEAL FLORA Performed at [facility]    Report Status 03/20/2015 FINAL  Final  Pneumocystis smear by DFA     Status: None   Collection Time: 03/17/15  3:28 PM  Result Value Ref Range Status   Specimen Source-[facility] SPUTUM  Final   Pneumocystis jiroveci Ag NEGATIVE  Final    Comment: Performed at [facility]  AFB culture with smear     Status: None (Preliminary result)   Collection Time: 03/17/15  3:29 PM  Result Value Ref Range Status   Specimen Description SPUTUM  Final   Special Requests Immunocompromised  Final   Acid Fast Smear   Final    NO ACID FAST BACILLI SEEN Performed at [facility]    Culture   Final    CULTURE WILL BE EXAMINED FOR 6 WEEKS BEFORE ISSUING A FINAL REPORT Performed at [facility]    Report Status PENDING  Incomplete     Labs: Basic Metabolic Panel:  Recent Labs Lab 03/19/15 0236 03/20/15 0222 03/21/15 0249 03/22/15 0702 03/23/15 0526  NA 132* 130* 130* 131* 130*  K 4.2 5.0 4.6 4.7 4.4  CL 99* 98* 94* 91* 93*  CO2 GLUCOSE 125* 129* 119* 113* 100*  BUN CREATININE 0.60* 0.65 0.74 0.74 0.87  CALCIUM 8.9 9.1 9.2 9.6 9.2   Liver Function Tests: No results for input(s): AST, ALT, ALKPHOS, BILITOT, PROT, ALBUMIN in the last 168 hours. No results for input(s): LIPASE, AMYLASE in the last 168 hours. No results for input(s): AMMONIA in the last 168 hours. CBC:  Recent Labs Lab 03/17/15 1225 03/18/15 0443  WBC 12.4* 6.4  HGB  12.6* 12.2*  HCT 35.3* 36.0*  MCV 69.2* 70.3*  PLT 371 350   Cardiac Enzymes: No results for input(s): CKTOTAL,  CKMB, CKMBINDEX, TROPONINI in the last 168 hours. BNP: BNP (last 3 results) No results for input(s): BNP in the last 8760 hours.  ProBNP (last 3 results) No results for input(s): PROBNP in the last 8760 hours.  CBG:  Recent Labs Lab 03/22/15 1202 03/22/15 1725 03/22/15 2126 03/23/15 0812 03/23/15 1221  GLUCAP 101* 144* 130* 106* 171*       Signed:  Keone Kamer A  [facility] 03/23/2015, 3:28 PM

## 2015-03-23 NOTE — Care Management Note (Signed)
Case Management Note  Patient Details  Name: Jon Love MRN: [id number] Date of Birth: 1976-04-27  Subjective/Objective:                 Patient to discharge today to home. Self care. Patient provided with MATCH for Rx. [facility] Dr Drue SecondSnider to schedule follow up in a few weeks at ID clinic and she will establish patient with [facility].    Action/Plan:  Will review MATCH at 1:30 with spanish interprter. Expected Discharge Date:                  Expected Discharge Plan:  Home/Self Care  In-House Referral:     Discharge planning Services  CM Consult, Medication Assistance, MATCH Program, Indigent Health Clinic  Post Acute Care Choice:    Choice offered to:     DME Arranged:    DME Agency:     HH Arranged:    HH Agency:     Status of Service:  Completed, signed off  Medicare Important Message Given:    Date Medicare IM Given:    Medicare IM give by:    Date Additional Medicare IM Given:    Additional Medicare Important Message give by:     If discussed at Long Length of Stay Meetings, dates discussed:    Additional Comments:  Lawerance SabalDebbie Czar Ysaguirre, RN 03/23/2015, 12:48 PM

## 2015-03-24 DIAGNOSIS — B20 Human immunodeficiency virus [HIV] disease: Secondary | ICD-10-CM | POA: Insufficient documentation

## 2015-03-24 DIAGNOSIS — K219 Gastro-esophageal reflux disease without esophagitis: Secondary | ICD-10-CM | POA: Insufficient documentation

## 2015-03-24 DIAGNOSIS — B370 Candidal stomatitis: Secondary | ICD-10-CM | POA: Insufficient documentation

## 2015-03-24 DIAGNOSIS — R0902 Hypoxemia: Secondary | ICD-10-CM | POA: Insufficient documentation

## 2015-03-30 ENCOUNTER — Inpatient Hospital Stay: Payer: Self-pay | Admitting: Family Medicine

## 2015-03-30 ENCOUNTER — Other Ambulatory Visit: Payer: Self-pay

## 2015-04-09 ENCOUNTER — Inpatient Hospital Stay: Payer: Self-pay | Admitting: Internal Medicine

## 2015-04-24 ENCOUNTER — Ambulatory Visit: Payer: Self-pay | Admitting: Internal Medicine

## 2015-04-29 LAB — AFB CULTURE WITH SMEAR (NOT AT ARMC): Acid Fast Smear: NONE SEEN

## 2015-06-23 ENCOUNTER — Emergency Department (HOSPITAL_COMMUNITY): Payer: Self-pay

## 2015-06-23 ENCOUNTER — Emergency Department (HOSPITAL_COMMUNITY)
Admission: EM | Admit: 2015-06-23 | Discharge: 2015-06-23 | Disposition: A | Discharge status: Home / Self Care | Payer: Self-pay | Attending: Emergency Medicine | Admitting: Emergency Medicine

## 2015-06-23 ENCOUNTER — Encounter (HOSPITAL_COMMUNITY): Payer: Self-pay | Admitting: Emergency Medicine

## 2015-06-23 DIAGNOSIS — R053 Chronic cough: Secondary | ICD-10-CM

## 2015-06-23 DIAGNOSIS — Z880 Allergy status to penicillin: Secondary | ICD-10-CM | POA: Insufficient documentation

## 2015-06-23 DIAGNOSIS — R05 Cough: Secondary | ICD-10-CM | POA: Insufficient documentation

## 2015-06-23 DIAGNOSIS — E876 Hypokalemia: Secondary | ICD-10-CM | POA: Insufficient documentation

## 2015-06-23 DIAGNOSIS — Z21 Asymptomatic human immunodeficiency virus [HIV] infection status: Secondary | ICD-10-CM | POA: Insufficient documentation

## 2015-06-23 DIAGNOSIS — R112 Nausea with vomiting, unspecified: Secondary | ICD-10-CM

## 2015-06-23 DIAGNOSIS — B3781 Candidal esophagitis: Secondary | ICD-10-CM | POA: Insufficient documentation

## 2015-06-23 DIAGNOSIS — R197 Diarrhea, unspecified: Secondary | ICD-10-CM | POA: Insufficient documentation

## 2015-06-23 LAB — URINALYSIS, ROUTINE W REFLEX MICROSCOPIC
BILIRUBIN URINE: NEGATIVE
Glucose, UA: NEGATIVE mg/dL
HGB URINE DIPSTICK: NEGATIVE
Ketones, ur: NEGATIVE mg/dL
Leukocytes, UA: NEGATIVE
Nitrite: NEGATIVE
PROTEIN: NEGATIVE mg/dL
Specific Gravity, Urine: 1.034 — ABNORMAL HIGH (ref 1.005–1.030)
pH: 6 (ref 5.0–8.0)

## 2015-06-23 LAB — CBC
HCT: 45.8 % (ref 39.0–52.0)
Hemoglobin: 16.6 g/dL (ref 13.0–17.0)
MCH: 26 pg (ref 26.0–34.0)
MCHC: 36.2 g/dL — AB (ref 30.0–36.0)
MCV: 71.8 fL — ABNORMAL LOW (ref 78.0–100.0)
PLATELETS: 276 10*3/uL (ref 150–400)
RBC: 6.38 MIL/uL — ABNORMAL HIGH (ref 4.22–5.81)
RDW: 17.7 % — ABNORMAL HIGH (ref 11.5–15.5)
WBC: 10.8 10*3/uL — ABNORMAL HIGH (ref 4.0–10.5)

## 2015-06-23 LAB — COMPREHENSIVE METABOLIC PANEL
ALT: 29 U/L (ref 17–63)
AST: 34 U/L (ref 15–41)
Albumin: 3.9 g/dL (ref 3.5–5.0)
Alkaline Phosphatase: 117 U/L (ref 38–126)
Anion gap: 11 (ref 5–15)
BUN: 30 mg/dL — ABNORMAL HIGH (ref 6–20)
CALCIUM: 8.9 mg/dL (ref 8.9–10.3)
CO2: 19 mmol/L — AB (ref 22–32)
CREATININE: 1.23 mg/dL (ref 0.61–1.24)
Chloride: 103 mmol/L (ref 101–111)
GFR calc Af Amer: >60 mL/min (ref >60)
GFR calc non Af Amer: >60 mL/min (ref >60)
GLUCOSE: 151 mg/dL — AB (ref 65–99)
Potassium: 3.2 mmol/L — ABNORMAL LOW (ref 3.5–5.1)
SODIUM: 133 mmol/L — AB (ref 135–145)
Total Bilirubin: 2 mg/dL — ABNORMAL HIGH (ref 0.3–1.2)
Total Protein: 6.9 g/dL (ref 6.5–8.1)

## 2015-06-23 LAB — POC OCCULT BLOOD, ED: Fecal Occult Bld: NEGATIVE

## 2015-06-23 LAB — LIPASE, BLOOD: Lipase: 31 U/L (ref 11–51)

## 2015-06-23 MED ORDER — ONDANSETRON HCL 4 MG PO TABS: 4.0000 mg | ORAL_TABLET | Freq: Three times a day (TID) | ORAL | Status: DC | PRN

## 2015-06-23 MED ORDER — IOHEXOL 300 MG/ML  SOLN
100.0000 mL | Freq: Once | INTRAMUSCULAR | Status: AC | PRN
  Administered 2015-06-23: 75 mL via INTRAVENOUS

## 2015-06-23 MED ORDER — SODIUM CHLORIDE 0.9 % IV BOLUS (SEPSIS)
1000.0000 mL | Freq: Once | INTRAVENOUS | Status: AC
  Administered 2015-06-23: 1000 mL via INTRAVENOUS

## 2015-06-23 MED ORDER — ONDANSETRON HCL 4 MG/2ML IJ SOLN
4.0000 mg | Freq: Once | INTRAMUSCULAR | Status: AC
  Administered 2015-06-23: 4 mg via INTRAVENOUS
  Filled 2015-06-23: qty 2

## 2015-06-23 MED ORDER — NYSTATIN 100000 UNIT/ML MT SUSP
5.0000 mL | Freq: Four times a day (QID) | OROMUCOSAL | Status: DC
  Administered 2015-06-23: 500000 [IU] via ORAL
  Filled 2015-06-23: qty 5

## 2015-06-23 MED ORDER — NYSTATIN 100000 UNIT/ML MT SUSP: 5.0000 mL | Freq: Four times a day (QID) | OROMUCOSAL | Status: DC

## 2015-06-23 MED ORDER — POTASSIUM CHLORIDE CRYS ER 20 MEQ PO TBCR
40.0000 meq | EXTENDED_RELEASE_TABLET | Freq: Once | ORAL | Status: AC
  Administered 2015-06-23: 40 meq via ORAL
  Filled 2015-06-23: qty 2

## 2015-06-23 MED ORDER — MORPHINE SULFATE (PF) 4 MG/ML IV SOLN
4.0000 mg | Freq: Once | INTRAVENOUS | Status: AC
  Administered 2015-06-23: 4 mg via INTRAVENOUS
  Filled 2015-06-23: qty 1

## 2015-06-23 NOTE — ED Notes (Signed)
MD at bedside. Speaking with pt through interpretor phone.

## 2015-06-23 NOTE — ED Notes (Signed)
Pt information through interpretor. Girlfriend asked to leave room.

## 2015-06-23 NOTE — ED Provider Notes (Signed)
CSN: [id number]     Arrival date & time 06/23/15  1453 History   First MD Initiated Contact with Patient 06/23/15 1729     Chief Complaint  Patient presents with  . Abdominal Pain     (Consider location/radiation/quality/duration/timing/severity/associated sxs/prior Treatment) HPI   40 year old male with history of HIV who presents for evaluation of abdominal pain. History obtained through [facility] phone interpreter. Patient reports gradual onset of diffuse abdominal pain with associate nausea vomiting and diarrhea ongoing for the past 4 days. He described pain as a pulsing stabbing 8 out of 10 in severity most significant to his low abdomen. Endorse 5 episodes of nonbloody nonbilious vomit and 3 episodes of dark black stool. He is only able to tolerate chicken soup for the past several days. He admits to having significant alcohol history including drinking 5 alcohol beverages a day without any illicit drug use. He also has had a persistent cough since November of last year. States increased abdominal pain with coughing. He denies having fever but endorsed chills. He denies having chest pain or shortness of breath, no productive cough, hemoptysis, hematemesis or hematochezia.  Our pharmacy tech notified that patient has not been taking his HIV medication. He appears to have poor understanding of his medical disease. He has not taken any HIV medication or follow-up with his infectious disease specialist.  Past Medical History  Diagnosis Date  . HIV disease [facility])    Past Surgical History  Procedure Laterality Date  . Ercp N/A 10/09/2014    Procedure: ENDOSCOPIC RETROGRADE CHOLANGIOPANCREATOGRAPHY (ERCP);  Surgeon: Jeani Hawking, MD;  Location: [facility] ENDOSCOPY;  Service: Endoscopy;  Laterality: N/A;  . Cholecystectomy N/A 10/11/2014    Procedure: LAPAROSCOPIC CHOLECYSTECTOMY WITH INTRAOPERATIVE CHOLANGIOGRAM;  Surgeon: Abigail Miyamoto, MD;  Location: [facility] OR;  Service: General;  Laterality: N/A;   No  family history on file. Social History  Substance Use Topics  . Smoking status: Never Smoker   . Smokeless tobacco: Never Used  . Alcohol Use: 0.0 oz/week    0 Standard drinks or equivalent per week     Comment: beer    Review of Systems  All other systems reviewed and are negative.     Allergies  Penicillins  Home Medications   Prior to Admission medications   Medication Sig Start Date End Date Taking? Authorizing Provider  bismuth subsalicylate (PEPTO BISMOL) 262 MG/15ML suspension Take 30 mLs by mouth every 6 (six) hours as needed for indigestion or diarrhea or loose stools.   Yes Historical Provider, MD  azithromycin (ZITHROMAX) 600 MG tablet Take 2 tablets (1,200 mg total) by mouth once a week. On Mondays Patient not taking: Reported on 06/23/2015 03/23/15   Clydia Llano, MD  elvitegravir-cobicistat-emtricitabine-tenofovir (GENVOYA) 150-150-200-10 MG TABS tablet Take 1 tablet by mouth daily with breakfast. Patient not taking: Reported on 06/23/2015 03/23/15   Clydia Llano, MD   BP 109/83 mmHg  Pulse 108  Temp(Src) 97.3 F (36.3 C) (Oral)  Resp 16  SpO2 98% Physical Exam  Constitutional: He is oriented to person, place, and time.  Frail-appearing Hispanic male nontoxic in has mild temporal wasting.  HENT:  Throat: Diffuse erythematous skin changes with white plaques noted throughout hard and soft palate and posterior pharynx without trismus.  Cardiovascular: Normal rate and regular rhythm.   Pulmonary/Chest:  Faint crackles heard at right lung base without any wheezes or rhonchi.  Abdominal: Soft. There is tenderness (Mild tenderness low abdomen without focal point tenderness. No inguinal hernia noted.).  Genitourinary:  Chaperone present during exam. Normal rectal tone, melanotic stool, Hemoccult-positive. No rectal tenderness.  Neurological: He is alert and oriented to person, place, and time.  Skin: Rash (Multiple papules with central umbilication noted throughout  body consistence with molluscum contagiosum) noted.  Nursing note and vitals reviewed.   ED Course  Procedures (including critical care time) Labs Review Labs Reviewed  COMPREHENSIVE METABOLIC PANEL - Abnormal; Notable for the following:    Sodium 133 (*)    Potassium 3.2 (*)    CO2 19 (*)    Glucose, Bld 151 (*)    BUN 30 (*)    Total Bilirubin 2.0 (*)    All other components within normal limits  CBC - Abnormal; Notable for the following:    WBC 10.8 (*)    RBC 6.38 (*)    MCV 71.8 (*)    MCHC 36.2 (*)    RDW 17.7 (*)    All other components within normal limits  URINALYSIS, ROUTINE W REFLEX MICROSCOPIC (NOT AT [facility]) - Abnormal; Notable for the following:    Specific Gravity, Urine 1.034 (*)    All other components within normal limits  LIPASE, BLOOD  POC OCCULT BLOOD, ED    Imaging Review Dg Chest 2 View  06/23/2015  CLINICAL DATA:  Cough abdominal pain EXAM: CHEST  2 VIEW COMPARISON:  03/17/2015 FINDINGS: Normal cardiac silhouette. No effusion, infiltrate, pneumothorax. Bilateral nipple shadows noted. No acute osseous abnormality. IMPRESSION: No acute cardiopulmonary process. Electronically Signed   By: Genevive BiStewart  Edmunds M.D.   On: 06/23/2015 19:43   Ct Abdomen Pelvis W Contrast  06/23/2015  CLINICAL DATA:  Acute onset of generalized abdominal pain and cough. Vomiting and diarrhea. Initial encounter. EXAM: CT ABDOMEN AND PELVIS WITH CONTRAST TECHNIQUE: Multidetector CT imaging of the abdomen and pelvis was performed using the standard protocol following bolus administration of intravenous contrast. CONTRAST:  75mL OMNIPAQUE IOHEXOL 300 MG/ML  SOLN COMPARISON:  CT the abdomen and pelvis from 03/15/2015 FINDINGS: Mild bronchiectasis is noted at the lung bases. The lung bases are otherwise clear. There is diffuse intrahepatic biliary ductal dilatation, somewhat more prominent than in October. The common bile duct measures 1.1 cm in size, within normal limits status post  cholecystectomy. The spleen is mildly enlarged, measuring 13.6 cm in length. Clips are noted along the gallbladder fossa. The pancreas and adrenal glands are unremarkable. The kidneys are unremarkable in appearance. There is no evidence of hydronephrosis. No renal or ureteral stones are seen. No perinephric stranding is appreciated. No free fluid is identified. The small bowel is unremarkable in appearance. The stomach is within normal limits. No acute vascular abnormalities are seen. The appendix is normal in caliber, without evidence of appendicitis. The colon is largely filled with fluid and air, which may explain the patient's diarrhea. The bladder is mildly distended and grossly unremarkable. The prostate is normal in size. No inguinal lymphadenopathy is seen. No acute osseous abnormalities are identified. IMPRESSION: 1. Colon largely filled with fluid and air, which may explain the patient's diarrhea. This may reflect an underlying mild infectious or inflammatory process, though the colon is otherwise grossly unremarkable. 2. Mild splenomegaly. 3. Mild bronchiectasis at the lung bases. Lung bases otherwise clear. 4. Diffuse intrahepatic biliary ductal dilatation is somewhat more prominent than in October, status post cholecystectomy. Would correlate for any evidence of post-cholecystectomy syndrome. Electronically Signed   By: Roanna RaiderJeffery  Chang M.D.   On: 06/23/2015 20:15   I have personally reviewed and evaluated these images and  lab results as part of my medical decision-making.   EKG Interpretation None      MDM   Final diagnoses:  Nausea vomiting and diarrhea  Persistent cough  Candida esophagitis (HCC)    BP 107/94 mmHg  Pulse 90  Temp(Src) 97.3 F (36.3 C) (Oral)  Resp 16  SpO2 100%   7:06 PM This is an HIV/AIDS patient who has had persistent cough ongoing since last November here with abdominal pain nausea vomiting and diarrhea. Cough is concerning for possible lung infection,  chest x-ray ordered. Abdominal pain may likely due to viral GI, however abdominal pelvic CT scan ordered to rule out acute pathology. Evidence of candidal esophagitis on exam, nystatin suspension given. Patient also has multiple papules with central umbilication consistence with molluscum contagiosum. Workup initiated.  8:26 PM  labs with very mild leukocytosis, WBC of 10.8. Mild hypokalemia with a potassium of 3.2, supplementation will be given. Fecal occult test is negative. His chest x-ray shows no evidence of pneumonia. An abdominal and pelvis CT scan showing mild underlying infectious versus inflammatory process otherwise unremarkable.  Since pt is likely AIDS, he's vulnerable for cryptosporidium infection causing diarrhea.  He will need to start on his HAART therapy for management of his condition.  He is able to tolerates PO, pain is well control, has received IVF.  I encourage pt to f/u closely with his ID Dr. Drue Second for management of his condition.  Fluconazole mouth wash prescribed for candidal esophagitis.  Cough medication prescribed.  Care discussed with attending.    10:49 PM I have used interpreter phone to explain finding and plan for patient. All questions answered to patient's satisfaction. He understands that he must follow-up closely with his ID specialist next week for further management. Return precaution discussed. He is currently able to target his by mouth and comfortable going home.  Fayrene Helper, PA-C 06/23/15 2250  Gerhard Munch, MD 06/23/15 [phone]

## 2015-06-23 NOTE — ED Notes (Signed)
PA at bedside.

## 2015-06-23 NOTE — ED Notes (Signed)
Pt reports that he has been having lower abd pain since Tuesday with N/V/D. Pt also reports generlized body aches. Pt alert x4. NAD at this time.

## 2015-07-11 ENCOUNTER — Telehealth: Payer: Self-pay | Admitting: *Deleted

## 2015-07-11 NOTE — Telephone Encounter (Signed)
Patient referred to [facility].

## 2015-08-14 ENCOUNTER — Emergency Department (HOSPITAL_COMMUNITY): Payer: Self-pay

## 2015-08-14 ENCOUNTER — Inpatient Hospital Stay (HOSPITAL_COMMUNITY)
Admission: EM | Admit: 2015-08-14 | Discharge: 2015-08-18 | DRG: 371 | Disposition: A | Discharge status: Home / Self Care | Payer: Self-pay | Attending: Internal Medicine | Admitting: Internal Medicine

## 2015-08-14 ENCOUNTER — Encounter (HOSPITAL_COMMUNITY): Payer: Self-pay | Admitting: Cardiology

## 2015-08-14 DIAGNOSIS — R1310 Dysphagia, unspecified: Secondary | ICD-10-CM | POA: Diagnosis present

## 2015-08-14 DIAGNOSIS — Z79899 Other long term (current) drug therapy: Secondary | ICD-10-CM

## 2015-08-14 DIAGNOSIS — E872 Acidosis: Secondary | ICD-10-CM | POA: Diagnosis present

## 2015-08-14 DIAGNOSIS — R197 Diarrhea, unspecified: Secondary | ICD-10-CM | POA: Insufficient documentation

## 2015-08-14 DIAGNOSIS — E876 Hypokalemia: Secondary | ICD-10-CM | POA: Diagnosis present

## 2015-08-14 DIAGNOSIS — B370 Candidal stomatitis: Secondary | ICD-10-CM | POA: Diagnosis present

## 2015-08-14 DIAGNOSIS — Z880 Allergy status to penicillin: Secondary | ICD-10-CM

## 2015-08-14 DIAGNOSIS — D649 Anemia, unspecified: Secondary | ICD-10-CM | POA: Clinically undetermined

## 2015-08-14 DIAGNOSIS — B3781 Candidal esophagitis: Secondary | ICD-10-CM | POA: Diagnosis present

## 2015-08-14 DIAGNOSIS — Z6820 Body mass index (BMI) 20.0-20.9, adult: Secondary | ICD-10-CM

## 2015-08-14 DIAGNOSIS — K529 Noninfective gastroenteritis and colitis, unspecified: Secondary | ICD-10-CM | POA: Diagnosis present

## 2015-08-14 DIAGNOSIS — R1084 Generalized abdominal pain: Secondary | ICD-10-CM | POA: Insufficient documentation

## 2015-08-14 DIAGNOSIS — B20 Human immunodeficiency virus [HIV] disease: Secondary | ICD-10-CM | POA: Diagnosis present

## 2015-08-14 DIAGNOSIS — E43 Unspecified severe protein-calorie malnutrition: Secondary | ICD-10-CM | POA: Diagnosis present

## 2015-08-14 DIAGNOSIS — D638 Anemia in other chronic diseases classified elsewhere: Secondary | ICD-10-CM | POA: Diagnosis present

## 2015-08-14 DIAGNOSIS — E860 Dehydration: Secondary | ICD-10-CM | POA: Diagnosis present

## 2015-08-14 DIAGNOSIS — A044 Other intestinal Escherichia coli infections: Principal | ICD-10-CM | POA: Diagnosis present

## 2015-08-14 LAB — CBC
HEMATOCRIT: 45.7 % (ref 39.0–52.0)
Hemoglobin: 16 g/dL (ref 13.0–17.0)
MCH: 24.9 pg — ABNORMAL LOW (ref 26.0–34.0)
MCHC: 35 g/dL (ref 30.0–36.0)
MCV: 71.1 fL — AB (ref 78.0–100.0)
PLATELETS: 328 10*3/uL (ref 150–400)
RBC: 6.43 MIL/uL — AB (ref 4.22–5.81)
RDW: 15.4 % (ref 11.5–15.5)
WBC: 9.9 10*3/uL (ref 4.0–10.5)

## 2015-08-14 LAB — URINALYSIS, ROUTINE W REFLEX MICROSCOPIC
Bilirubin Urine: NEGATIVE
GLUCOSE, UA: NEGATIVE mg/dL
KETONES UR: 15 mg/dL — AB
LEUKOCYTES UA: NEGATIVE
NITRITE: NEGATIVE
PROTEIN: NEGATIVE mg/dL
Specific Gravity, Urine: 1.041 — ABNORMAL HIGH (ref 1.005–1.030)
pH: 6.5 (ref 5.0–8.0)

## 2015-08-14 LAB — COMPREHENSIVE METABOLIC PANEL
ALT: 42 U/L (ref 17–63)
AST: 37 U/L (ref 15–41)
Albumin: 4 g/dL (ref 3.5–5.0)
Alkaline Phosphatase: 122 U/L (ref 38–126)
Anion gap: 12 (ref 5–15)
BILIRUBIN TOTAL: 1.4 mg/dL — AB (ref 0.3–1.2)
BUN: 24 mg/dL — ABNORMAL HIGH (ref 6–20)
CHLORIDE: 108 mmol/L (ref 101–111)
CO2: 15 mmol/L — ABNORMAL LOW (ref 22–32)
CREATININE: 1.34 mg/dL — AB (ref 0.61–1.24)
Calcium: 9.2 mg/dL (ref 8.9–10.3)
Glucose, Bld: 136 mg/dL — ABNORMAL HIGH (ref 65–99)
POTASSIUM: 3.7 mmol/L (ref 3.5–5.1)
Sodium: 135 mmol/L (ref 135–145)
TOTAL PROTEIN: 7.4 g/dL (ref 6.5–8.1)

## 2015-08-14 LAB — URINE MICROSCOPIC-ADD ON

## 2015-08-14 LAB — LIPASE, BLOOD: LIPASE: 24 U/L (ref 11–51)

## 2015-08-14 LAB — POC OCCULT BLOOD, ED: Fecal Occult Bld: NEGATIVE

## 2015-08-14 MED ORDER — ONDANSETRON HCL 4 MG/2ML IJ SOLN
4.0000 mg | Freq: Once | INTRAMUSCULAR | Status: AC
  Administered 2015-08-14: 4 mg via INTRAVENOUS
  Filled 2015-08-14: qty 2

## 2015-08-14 MED ORDER — SODIUM CHLORIDE 0.9 % IV BOLUS (SEPSIS)
1000.0000 mL | Freq: Once | INTRAVENOUS | Status: AC
  Administered 2015-08-14: 1000 mL via INTRAVENOUS

## 2015-08-14 MED ORDER — SODIUM CHLORIDE 0.9 % IV SOLN: INTRAVENOUS | Status: DC

## 2015-08-14 MED ORDER — IOHEXOL 300 MG/ML  SOLN
100.0000 mL | Freq: Once | INTRAMUSCULAR | Status: AC | PRN
  Administered 2015-08-14: 100 mL via INTRAVENOUS

## 2015-08-14 MED ORDER — HYDROMORPHONE HCL 1 MG/ML IJ SOLN: 1.0000 mg | Freq: Once | INTRAMUSCULAR | Status: DC

## 2015-08-14 MED ORDER — HYDROMORPHONE HCL 1 MG/ML IJ SOLN
1.0000 mg | Freq: Once | INTRAMUSCULAR | Status: AC
  Administered 2015-08-14: 1 mg via INTRAVENOUS
  Filled 2015-08-14: qty 1

## 2015-08-14 NOTE — ED Provider Notes (Signed)
CSN: [id number]     Arrival date & time 08/14/15  1101 History   First MD Initiated Contact with Patient 08/14/15 1822     Chief Complaint  Patient presents with  . Abdominal Pain  . Diarrhea     (Consider location/radiation/quality/duration/timing/severity/associated sxs/prior Treatment) HPI Patient presents to the emergency department with abdominal pain that started 3 days ago with diarrhea that started 5 days ago.  The patient states that he had 2 episodes of vomiting this morning.  He denies headache, blurred vision, weakness, numbness, dizziness, back pain, neck pain, chest pain, shortness of breath, rash, near syncope, dysuria, incontinence, bloody stool, hematemesis, fever, near syncope or syncope.  The patient states that nothing seems make his condition, better or worse. Past Medical History  Diagnosis Date  . HIV disease [facility])    Past Surgical History  Procedure Laterality Date  . Ercp N/A 10/09/2014    Procedure: ENDOSCOPIC RETROGRADE CHOLANGIOPANCREATOGRAPHY (ERCP);  Surgeon: Jeani Hawking, MD;  [facility] ENDOSCOPY;  Service: Endoscopy;  Laterality: N/A;  . Cholecystectomy N/A 10/11/2014    Procedure: LAPAROSCOPIC CHOLECYSTECTOMY WITH INTRAOPERATIVE CHOLANGIOGRAM;  Surgeon: Abigail Miyamoto, MD;  Location: [facility] OR;  Service: General;  Laterality: N/A;   History reviewed. No pertinent family history. Social History  Substance Use Topics  . Smoking status: Never Smoker   . Smokeless tobacco: Never Used  . Alcohol Use: 0.0 oz/week    0 Standard drinks or equivalent per week     Comment: beer    Review of Systems  All other systems negative except as documented in the HPI. All pertinent positives and negatives as reviewed in the HPI.  Allergies  Penicillins  Home Medications   Prior to Admission medications   Medication Sig Start Date End Date Taking? Authorizing Provider  azithromycin (ZITHROMAX) 600 MG tablet Take 2 tablets (1,200 mg total) by mouth once a week. On  Mondays Patient not taking: Reported on 06/23/2015 03/23/15   Clydia Llano, MD  bismuth subsalicylate (PEPTO BISMOL) 262 MG/15ML suspension Take 30 mLs by mouth every 6 (six) hours as needed for indigestion or diarrhea or loose stools.    Historical Provider, MD  elvitegravir-cobicistat-emtricitabine-tenofovir (GENVOYA) 150-150-200-10 MG TABS tablet Take 1 tablet by mouth daily with breakfast. Patient not taking: Reported on 06/23/2015 03/23/15   Clydia Llano, MD  nystatin (MYCOSTATIN) 100000 UNIT/ML suspension Take 5 mLs (500,000 Units total) by mouth 4 (four) times daily. 06/23/15   Fayrene Helper, PA-C  ondansetron (ZOFRAN) 4 MG tablet Take 1 tablet (4 mg total) by mouth every 8 (eight) hours as needed for nausea or vomiting. 06/23/15   Fayrene Helper, PA-C   BP 115/62 mmHg  Pulse 83  Temp(Src) 98.5 F (36.9 C) (Oral)  Resp 18  SpO2 99% Physical Exam  Constitutional: He is oriented to person, place, and time. He appears well-developed and well-nourished. No distress.  HENT:  Head: Normocephalic and atraumatic.  Mouth/Throat: Oropharynx is clear and moist.  Eyes: Pupils are equal, round, and reactive to light.  Neck: Normal range of motion. Neck supple.  Cardiovascular: Normal rate, regular rhythm and normal heart sounds.  Exam reveals no gallop and no friction rub.   No murmur heard. Pulmonary/Chest: Effort normal and breath sounds normal. No respiratory distress. He has no wheezes.  Abdominal: Soft. Bowel sounds are normal. He exhibits no distension. There is tenderness. There is no rebound and no guarding.  Neurological: He is alert and oriented to person, place, and time. He exhibits normal muscle tone. Coordination  normal.  Skin: Skin is warm and dry. No rash noted. No erythema.  Psychiatric: He has a normal mood and affect. His behavior is normal.  Nursing note and vitals reviewed.   ED Course  Procedures (including critical care time) Labs Review Labs Reviewed  COMPREHENSIVE METABOLIC  PANEL - Abnormal; Notable for the following:    CO2 15 (*)    Glucose, Bld 136 (*)    BUN 24 (*)    Creatinine, Ser 1.34 (*)    Total Bilirubin 1.4 (*)    All other components within normal limits  CBC - Abnormal; Notable for the following:    RBC 6.43 (*)    MCV 71.1 (*)    MCH 24.9 (*)    All other components within normal limits  LIPASE, BLOOD  URINALYSIS, ROUTINE W REFLEX MICROSCOPIC (NOT AT [facility])  RPR  POC OCCULT BLOOD, ED    Imaging Review No results found. I have personally reviewed and evaluated these images and lab results as part of my medical decision-making.  Marlon Peliffany Greene, PA-C will await the CT scan results.  Patient is been stable here in the emergency department The patient may or may not need admission hospital based on whether he is able to tolerate fluids.  He does have some dehydration   Charlestine NightChristopher Hokulani Rogel, PA-C 08/15/15 1911  Courteney Randall AnLyn Mackuen, MD 08/19/15 0740

## 2015-08-14 NOTE — ED Notes (Signed)
Patient transported to CT 

## 2015-08-14 NOTE — ED Notes (Signed)
Pt reports abd pain with diarrhea for the past 2 days.

## 2015-08-14 NOTE — ED Provider Notes (Signed)
Patient sign out from Jon Nighthristopher Lawyer, PA-C  Patient has AIDS, last CD4 count was 10. He admits to not taking his medications for the past month because he ran out of everything. He is having diarrhea for the past 5 days with abdominal pain. He has had many episodes of diarrhea including multiple episodes in the ED. Labs show dehydration. Normal electrolytes, negative hemoccult, mildly tachycardic. CT abd/pelv with shows concern for gastroenteritis.  C.diff and stool culture orders added on. Patient will need admission due to his immune deficiency status, dehydration and diarrhea or known etiology. Discussed plan of care with patient which includes admission and further testing using phone interpretor after asking his friend to step out. He is agreeable to this.  Discussed case with Dr. Corlis LeakMacKuen. [facility], Dr. Robb Matarrtiz, inaptient, Tele  Filed Vitals:   08/14/15 1325 08/14/15 2045  BP: 115/62 111/78  Pulse: 83 100  Temp:    Resp: [address]      Jon Rausch, PA-C 08/14/15 2331  Courteney Lyn Mackuen, MD 08/15/15 [id number]

## 2015-08-14 NOTE — ED Notes (Signed)
Pt able to drink fluids.  

## 2015-08-15 ENCOUNTER — Encounter (HOSPITAL_COMMUNITY): Payer: Self-pay | Admitting: Internal Medicine

## 2015-08-15 DIAGNOSIS — B370 Candidal stomatitis: Secondary | ICD-10-CM

## 2015-08-15 DIAGNOSIS — R197 Diarrhea, unspecified: Secondary | ICD-10-CM

## 2015-08-15 DIAGNOSIS — R1310 Dysphagia, unspecified: Secondary | ICD-10-CM

## 2015-08-15 DIAGNOSIS — J029 Acute pharyngitis, unspecified: Secondary | ICD-10-CM

## 2015-08-15 DIAGNOSIS — K529 Noninfective gastroenteritis and colitis, unspecified: Secondary | ICD-10-CM

## 2015-08-15 DIAGNOSIS — E860 Dehydration: Secondary | ICD-10-CM

## 2015-08-15 DIAGNOSIS — R05 Cough: Secondary | ICD-10-CM

## 2015-08-15 DIAGNOSIS — E876 Hypokalemia: Secondary | ICD-10-CM

## 2015-08-15 DIAGNOSIS — E43 Unspecified severe protein-calorie malnutrition: Secondary | ICD-10-CM

## 2015-08-15 DIAGNOSIS — B20 Human immunodeficiency virus [HIV] disease: Secondary | ICD-10-CM | POA: Insufficient documentation

## 2015-08-15 LAB — GASTROINTESTINAL PANEL BY PCR, STOOL (REPLACES STOOL CULTURE)
Adenovirus F40/41: NOT DETECTED
Astrovirus: NOT DETECTED
CAMPYLOBACTER SPECIES: NOT DETECTED
CRYPTOSPORIDIUM: NOT DETECTED
Cyclospora cayetanensis: NOT DETECTED
E. coli O157: NOT DETECTED
ENTEROPATHOGENIC E COLI (EPEC): NOT DETECTED
ENTEROTOXIGENIC E COLI (ETEC): NOT DETECTED
Entamoeba histolytica: NOT DETECTED
Giardia lamblia: NOT DETECTED
NOROVIRUS GI/GII: NOT DETECTED
PLESIMONAS SHIGELLOIDES: NOT DETECTED
ROTAVIRUS A: NOT DETECTED
SALMONELLA SPECIES: NOT DETECTED
SHIGA LIKE TOXIN PRODUCING E COLI (STEC): NOT DETECTED
SHIGELLA/ENTEROINVASIVE E COLI (EIEC): NOT DETECTED
Sapovirus (I, II, IV, and V): NOT DETECTED
VIBRIO SPECIES: NOT DETECTED
Vibrio cholerae: NOT DETECTED
YERSINIA ENTEROCOLITICA: NOT DETECTED

## 2015-08-15 LAB — COMPREHENSIVE METABOLIC PANEL
ALBUMIN: 3.2 g/dL — AB (ref 3.5–5.0)
ALT: 34 U/L (ref 17–63)
ANION GAP: 13 (ref 5–15)
AST: 27 U/L (ref 15–41)
Alkaline Phosphatase: 97 U/L (ref 38–126)
BILIRUBIN TOTAL: 1 mg/dL (ref 0.3–1.2)
BUN: 21 mg/dL — ABNORMAL HIGH (ref 6–20)
CHLORIDE: 108 mmol/L (ref 101–111)
CO2: 15 mmol/L — AB (ref 22–32)
Calcium: 8.5 mg/dL — ABNORMAL LOW (ref 8.9–10.3)
Creatinine, Ser: 0.87 mg/dL (ref 0.61–1.24)
GFR calc Af Amer: >60 mL/min (ref >60)
GFR calc non Af Amer: >60 mL/min (ref >60)
GLUCOSE: 90 mg/dL (ref 65–99)
POTASSIUM: 3 mmol/L — AB (ref 3.5–5.1)
SODIUM: 136 mmol/L (ref 135–145)
TOTAL PROTEIN: 5.8 g/dL — AB (ref 6.5–8.1)

## 2015-08-15 LAB — CBC WITH DIFFERENTIAL/PLATELET
BASOS ABS: 0 10*3/uL (ref 0.0–0.1)
BASOS PCT: 0 %
EOS ABS: 0.4 10*3/uL (ref 0.0–0.7)
EOS PCT: 6 %
HCT: 39.1 % (ref 39.0–52.0)
Hemoglobin: 13.4 g/dL (ref 13.0–17.0)
Lymphocytes Relative: 38 %
Lymphs Abs: 2.8 10*3/uL (ref 0.7–4.0)
MCH: 24 pg — ABNORMAL LOW (ref 26.0–34.0)
MCHC: 34.3 g/dL (ref 30.0–36.0)
MCV: 70.1 fL — ABNORMAL LOW (ref 78.0–100.0)
MONO ABS: 0.8 10*3/uL (ref 0.1–1.0)
MONOS PCT: 10 %
Neutro Abs: 3.4 10*3/uL (ref 1.7–7.7)
Neutrophils Relative %: 46 %
PLATELETS: 230 10*3/uL (ref 150–400)
RBC: 5.58 MIL/uL (ref 4.22–5.81)
RDW: 15.1 % (ref 11.5–15.5)
WBC: 7.4 10*3/uL (ref 4.0–10.5)

## 2015-08-15 LAB — PHOSPHORUS: PHOSPHORUS: 4.1 mg/dL (ref 2.5–4.6)

## 2015-08-15 LAB — C DIFFICILE QUICK SCREEN W PCR REFLEX
C DIFFICILE (CDIFF) INTERP: NEGATIVE
C Diff antigen: NEGATIVE
C Diff toxin: NEGATIVE

## 2015-08-15 LAB — MAGNESIUM: Magnesium: 1.7 mg/dL (ref 1.7–2.4)

## 2015-08-15 MED ORDER — OXYCODONE-ACETAMINOPHEN 5-325 MG PO TABS
1.0000 | ORAL_TABLET | ORAL | Status: DC | PRN
  Administered 2015-08-15 – 2015-08-17 (×3): 2 via ORAL
  Filled 2015-08-15 (×3): qty 2

## 2015-08-15 MED ORDER — HYDROMORPHONE HCL 1 MG/ML IJ SOLN
1.0000 mg | INTRAMUSCULAR | Status: DC | PRN
  Administered 2015-08-15: 1 mg via INTRAVENOUS
  Filled 2015-08-15: qty 1

## 2015-08-15 MED ORDER — ONDANSETRON HCL 4 MG PO TABS: 4.0000 mg | ORAL_TABLET | Freq: Four times a day (QID) | ORAL | Status: DC | PRN

## 2015-08-15 MED ORDER — PANTOPRAZOLE SODIUM 40 MG IV SOLR
40.0000 mg | INTRAVENOUS | Status: DC
  Administered 2015-08-15: 40 mg via INTRAVENOUS
  Filled 2015-08-15: qty 40

## 2015-08-15 MED ORDER — SODIUM BICARBONATE 8.4 % IV SOLN
INTRAVENOUS | Status: DC
  Administered 2015-08-15 – 2015-08-16 (×2): via INTRAVENOUS
  Filled 2015-08-15 (×7): qty 850

## 2015-08-15 MED ORDER — NYSTATIN 100000 UNIT/ML MT SUSP
5.0000 mL | Freq: Three times a day (TID) | OROMUCOSAL | Status: DC
  Administered 2015-08-15 – 2015-08-18 (×14): 500000 [IU] via ORAL
  Filled 2015-08-15 (×15): qty 5

## 2015-08-15 MED ORDER — SULFAMETHOXAZOLE-TRIMETHOPRIM 800-160 MG PO TABS
1.0000 | ORAL_TABLET | Freq: Every day | ORAL | Status: DC
  Administered 2015-08-15 – 2015-08-18 (×4): 1 via ORAL
  Filled 2015-08-15 (×4): qty 1

## 2015-08-15 MED ORDER — CIPROFLOXACIN IN D5W 400 MG/200ML IV SOLN
400.0000 mg | Freq: Two times a day (BID) | INTRAVENOUS | Status: DC
  Administered 2015-08-15 – 2015-08-17 (×5): 400 mg via INTRAVENOUS
  Filled 2015-08-15 (×8): qty 200

## 2015-08-15 MED ORDER — METRONIDAZOLE IN NACL 5-0.79 MG/ML-% IV SOLN
500.0000 mg | Freq: Three times a day (TID) | INTRAVENOUS | Status: DC
  Administered 2015-08-15 – 2015-08-16 (×4): 500 mg via INTRAVENOUS
  Filled 2015-08-15 (×8): qty 100

## 2015-08-15 MED ORDER — ONDANSETRON HCL 4 MG/2ML IJ SOLN
4.0000 mg | Freq: Four times a day (QID) | INTRAMUSCULAR | Status: DC | PRN
  Administered 2015-08-15: 4 mg via INTRAVENOUS
  Filled 2015-08-15: qty 2

## 2015-08-15 MED ORDER — SODIUM CHLORIDE 0.9 % IV BOLUS (SEPSIS)
1000.0000 mL | Freq: Once | INTRAVENOUS | Status: AC
  Administered 2015-08-15: 1000 mL via INTRAVENOUS

## 2015-08-15 MED ORDER — POTASSIUM CHLORIDE CRYS ER 20 MEQ PO TBCR
40.0000 meq | EXTENDED_RELEASE_TABLET | ORAL | Status: AC
  Administered 2015-08-15 (×2): 40 meq via ORAL
  Filled 2015-08-15 (×2): qty 2

## 2015-08-15 MED ORDER — FAMOTIDINE IN NACL 20-0.9 MG/50ML-% IV SOLN
20.0000 mg | INTRAVENOUS | Status: DC
  Administered 2015-08-15 – 2015-08-16 (×2): 20 mg via INTRAVENOUS
  Filled 2015-08-15 (×2): qty 50

## 2015-08-15 MED ORDER — FLUCONAZOLE 100 MG PO TABS
100.0000 mg | ORAL_TABLET | Freq: Every day | ORAL | Status: DC
  Administered 2015-08-15 – 2015-08-18 (×4): 100 mg via ORAL
  Filled 2015-08-15 (×4): qty 1

## 2015-08-15 MED ORDER — MORPHINE SULFATE (PF) 2 MG/ML IV SOLN
1.0000 mg | INTRAVENOUS | Status: DC | PRN
  Administered 2015-08-15: 2 mg via INTRAVENOUS
  Filled 2015-08-15: qty 1

## 2015-08-15 MED ORDER — FLUCONAZOLE IN SODIUM CHLORIDE 200-0.9 MG/100ML-% IV SOLN
200.0000 mg | INTRAVENOUS | Status: DC
  Administered 2015-08-15: 200 mg via INTRAVENOUS
  Filled 2015-08-15: qty 100

## 2015-08-15 MED ORDER — ACETAMINOPHEN 325 MG PO TABS: 650.0000 mg | ORAL_TABLET | Freq: Four times a day (QID) | ORAL | Status: DC | PRN

## 2015-08-15 MED ORDER — POTASSIUM CHLORIDE IN NACL 20-0.9 MEQ/L-% IV SOLN
INTRAVENOUS | Status: DC
Start: 2015-08-15 — End: 2015-08-15
  Administered 2015-08-15 (×2): via INTRAVENOUS
  Filled 2015-08-15 (×4): qty 1000

## 2015-08-15 MED ORDER — SODIUM CHLORIDE 0.9 % IV SOLN
INTRAVENOUS | Status: DC
  Administered 2015-08-15: 12:00:00 via INTRAVENOUS
  Filled 2015-08-15 (×3): qty 1000

## 2015-08-15 MED ORDER — MAGNESIUM SULFATE 50 % IJ SOLN
3.0000 g | Freq: Once | INTRAVENOUS | Status: AC
  Administered 2015-08-15: 3 g via INTRAVENOUS
  Filled 2015-08-15 (×3): qty 6

## 2015-08-15 NOTE — ED Notes (Signed)
Floor notified patient arriving with RN for bedside report. Patient stable. Handoff given to charge RN 6N. Bed assigned for 18 minutes.

## 2015-08-15 NOTE — H&P (Signed)
[facility] History and Physical  Jon Love BJY:[id number] DOB: 12/23/1965 DOA: 08/14/2015  Referring physician: Marlon Pel, PA-C PCP: No PCP Per Patient   Spanish speaking only.  Chief Complaint: Abdominal pain and diarrhea for 5 days.  HPI: Jon Love is a 40 y.o. male with a past medical history of AIDS last CD4 count was less than 1, viral load 212 K in October 2016 who is coming to the emergency department with complaints of abdominal pain, associated with diarrhea, nausea, vomiting, fatigue and malaise for 5 days.  Per patient, about 5 days ago he developed epigastric abdominal pain which was followed by about 5 episodes of diarrhea/per day and about 2-3 episodes of nausea/emesis per day since then. He denies fever, but complains of chills and fatigue. He denies travel history or sick contacts. He is not suspicious of eating any particular food items which may have given him the symptoms.  He also complains of sore throat and odynophagia. He denies headache, dyspnea, night sweats, hemoptysis, but complains of a productive cough of yellowish sputum for the past 2 days.    Review of Systems:  Constitutional: Positive chills, fatigue. No weight loss, night sweats, Fevers  HEENT:  Positive odynophagia, difficulty swallowing, and sore throat.  No sneezing, itching, ear ache, nasal congestion, post nasal drip,  Cardio-vascular:  No chest pain, Orthopnea, PND, swelling in lower extremities, anasarca, dizziness, palpitations  GI:  Positive indigestion, abdominal pain, nausea, vomiting, diarrhea, loss of appetite  Resp:  Positive productive cough. Skin:  no rash or lesions.  GU:  no dysuria, change in color of urine, no urgency or frequency. No flank pain.  Musculoskeletal:  No joint pain or swelling. No decreased range of motion. No back pain.  Psych:  No change in mood or affect. No depression or anxiety. No memory loss.   Past Medical History    Diagnosis Date  . HIV disease [facility])    Past Surgical History  Procedure Laterality Date  . Ercp N/A 10/09/2014    Procedure: ENDOSCOPIC RETROGRADE CHOLANGIOPANCREATOGRAPHY (ERCP);  Surgeon: Jeani Hawking, MD;  Location: [facility] ENDOSCOPY;  Service: Endoscopy;  Laterality: N/A;  . Cholecystectomy N/A 10/11/2014    Procedure: LAPAROSCOPIC CHOLECYSTECTOMY WITH INTRAOPERATIVE CHOLANGIOGRAM;  Surgeon: Abigail Miyamoto, MD;  Location: [facility] OR;  Service: General;  Laterality: N/A;   Social History:  reports that he has never smoked. He has never used smokeless tobacco. He reports that he drinks alcohol. He reports that he does not use illicit drugs.  Allergies  Allergen Reactions  . Penicillins Hives and Itching        History reviewed. No pertinent family history.   Prior to Admission medications   Medication Sig Start Date End Date Taking? Authorizing Provider  acetaminophen (TYLENOL) 325 MG tablet Take 325 mg by mouth every 6 (six) hours as needed for mild pain or fever.   Yes Historical Provider, MD  bismuth subsalicylate (PEPTO BISMOL) 262 MG/15ML suspension Take 30 mLs by mouth every 6 (six) hours as needed for indigestion or diarrhea or loose stools.   Yes Historical Provider, MD  ondansetron (ZOFRAN) 4 MG tablet Take 1 tablet (4 mg total) by mouth every 8 (eight) hours as needed for nausea or vomiting. 06/23/15  Yes Fayrene Helper, PA-C  elvitegravir-cobicistat-emtricitabine-tenofovir (GENVOYA) 150-150-200-10 MG TABS tablet Take 1 tablet by mouth daily with breakfast. Patient not taking: Reported on 06/23/2015 03/23/15   Clydia Llano, MD   Physical Exam: Filed Vitals:   08/14/15 2230 08/14/15 2315 08/14/15  2330 08/15/15 0000  BP: 110/83 110/86 119/93 107/81  Pulse: 73 66 64 66  Temp:      TempSrc:      Resp:    16  SpO2: 100% 100% 100% 100%    Wt Readings from Last 3 Encounters:  03/23/15 55.2 kg (121 lb 11.1 oz)  12/18/14 66.679 kg (147 lb)  10/20/14 60.782 kg (134 lb)    General:   Appears calm and comfortable Eyes: PERRL, normal lids, irises & conjunctiva ENT: grossly normal hearing, lips. Oral mucosa is dry. Dentition in poor state of repair, positive glossal lesions consistent with leukoplakia and purple discoloration of heart palate. Positive white exudate on throat. Neck: no LAD, masses or thyromegaly Cardiovascular: RRR, no m/r/g. No LE edema. Telemetry: SR, no arrhythmias  Respiratory: CTA bilaterally, no w/r/r. Normal respiratory effort. Abdomen: Bowel sounds hyperactive, soft, mild epigastric tenderness, no guarding/ rebound Skin: no rash or induration seen on very limited exam. Musculoskeletal: grossly normal tone BUE/BLE. Psychiatric: grossly normal mood and affect, speech fluent and appropriate. Neurologic: Awake, alert, oriented 3, grossly non-focal.          Labs on Admission:  Basic Metabolic Panel:  Recent Labs Lab 08/14/15 1102  NA 135  K 3.7  CL 108  CO2 15*  GLUCOSE 136*  BUN 24*  CREATININE 1.34*  CALCIUM 9.2   Liver Function Tests:  Recent Labs Lab 08/14/15 1102  AST 37  ALT 42  ALKPHOS 122  BILITOT 1.4*  PROT 7.4  ALBUMIN 4.0    Recent Labs Lab 08/14/15 1102  LIPASE 24   CBC:  Recent Labs Lab 08/14/15 1102  WBC 9.9  HGB 16.0  HCT 45.7  MCV 71.1*  PLT 328   Radiological Exams on Admission: Ct Abdomen Pelvis W Contrast  08/14/2015  CLINICAL DATA:  Abdominal pain with nausea EXAM: CT ABDOMEN AND PELVIS WITH CONTRAST TECHNIQUE: Multidetector CT imaging of the abdomen and pelvis was performed using the standard protocol following bolus administration of intravenous contrast. CONTRAST:  OMNIPAQUE IOHEXOL 300 MG/ML  SOLN COMPARISON:  CT abdomen pelvis 06/23/2015 FINDINGS: Lower chest: Negative for infiltrate or effusion. Bronchial wall thickening similar to the prior study and likely chronic. Hepatobiliary: Prior cholecystectomy. Intrahepatic and extrahepatic biliary dilatation improved from the prior study.  Common bile duct now measures 8 mm in diameter. Question passed ductal stone. No pancreatic mass. No focal liver lesion Pancreas: Negative Spleen: Negative Adrenals/Urinary Tract: Symmetric excretion of contrast by both kidneys. No renal mass or obstruction. No renal calculi. Normal bladder. Stomach/Bowel: Fluid-filled large and small bowel loops. No dilated or thickened bowel loops. Terminal ileum normal. Appendix not definitely visualized. No evidence of acute appendicitis. Vascular/Lymphatic: Normal aorta.  No aneurysm.  No lymphadenopathy. Reproductive: Negative prostate Other: No free-fluid Musculoskeletal: Negative Image quality degraded by motion IMPRESSION: Fluid filled large and small bowel. No bowel dilatation or thickening. Question gastroenteritis or ileus. Biliary duct dilatation improved from the prior study. Question passed ductal stone. Bronchial wall thickening in the bases suggesting chronic bronchitis. Electronically Signed   By: Marlan Palau M.D.   On: 08/14/2015 21:13    Assessment/Plan Principal Problem:   Acute gastroenteritis   Dehydration   Diarrhea Admit to MedSurg. Continue IV hydration. Check stool Giardia/Crystoporidium. Check stool C. difficile. Start ciprofloxacin and Flagyl IV. Follow-up BUN/creatinine and electrolytes.  Active Problems:     AIDS (acquired immunodeficiency syndrome), CD4 <=200 (HCC) Last CD4 count was less than 1 on 03/16/2015. HIV 1 RNA Quant was  211920 However, the patient did not follow as an outpatient and did not start antiretrovirals. The patient stated that he will follow as an outpatient this time. Consider repeating CD4 count and HIV 1 RNA Quant. Consider consulting infectious disease if no improvement while in the hospital. The patient was told that he may need biopsy of oral lesions at some point in the future, not necessarily on this admission, but I stressed the importance for him to establish himself with a PCP and primary ID  specialist, so these things can be addressed properly and his HIV disease treated.    Thrush Start oral nystatin 4 times a day.      Code Status: Full code. DVT Prophylaxis: Mechanical with SCDs. Family Communication: His friend Marylene Landngela was present in the room. Disposition Plan: Admit for IV hydration, IV antibiotic therapy and further evaluation.   Time spent: Over 70 minutes were spent in the process of his admission.   Bobette Moavid Manuel Juandedios Dudash, M.D. [facility] Pager [phone].

## 2015-08-15 NOTE — ED Notes (Signed)
Breakfast tray delievered

## 2015-08-15 NOTE — Progress Notes (Signed)
[facility] PROGRESS NOTE  Jon Love WUJ:[id number] DOB: 12/23/1965 DOA: 08/14/2015 PCP: No PCP Per Patient  Assessment/Plan: #1 diarrhea/acute gastroenteritis Questionable etiology. Patient with history of AIDS last CD4 count was 10 03/16/2015. Viral load was 211920. The admitting physician patient never followed up with ID in the outpatient setting. C. difficile PCR is pending. GI panel per PCR pending. Continue empiric IV ciprofloxacin and IV Flagyl. Continue IV fluids. Antiemetics. Supportive care.  #2 hypokalemia Secondary to GI losses. Replete.  #3 dehydration IV fluids.  #4 AIDS Last CD4 count was 10 on 03/16/2015 with a viral load of 211920. Repeat CD4 count and viral load. Patient did not follow-up in the outpatient setting with ID. ID consultation for further evaluation and management. Will likely need to be started on Bactrim DS daily and azithromycin weekly for prophylaxis however will defer to ID.  #5 oral thrush Patient on nystatin. Admitting physician and stated that patient had some complaints of odynophagia and may have esophageal candidiasis. Will add Diflucan. ID consultation pending.  #6 severe protein calorie malnutrition Will likely need to be on nutritional supplementations when tolerating oral intake.  #7 prophylaxis Pepcid for GI prophylaxis. SCDs for DVT prophylaxis.  Code Status: Full Family Communication: Updated patient. No family at bedside. Disposition Plan: Remain inpatient. Home once diarrhea has resolved and electrolyte abnormalities have corrected with clinical improvement.   Consultants:  Infectious diseases pending  Procedures:  CT abdomen and pelvis 08/14/2015    Antibiotics:  IV Flagyl 08/15/2015  IV ciprofloxacin 08/15/2015  HPI/Subjective: Patient denies any further emesis. Patient states epigastric abdominal pain with some improvement. Patient had 3 loose bowel movements since presentation in the ED. No chest pain.  No shortness of breath.  Objective: Filed Vitals:   08/15/15 0939 08/15/15 1047  BP: 100/86 91/63  Pulse: 64 63  Temp: 97.4 F (36.3 C) 97 F (36.1 C)  Resp: 16 18   No intake or output data in the 24 hours ending 08/15/15 1111 Filed Weights   08/15/15 1047  Weight: 59.875 kg (132 lb)    Exam:   General:  NAD  Cardiovascular: RRR  Respiratory: CTAB  Abdomen: Nondistended, positive bowel sounds, some tenderness to palpation in the epigastrium, no rebound, no guarding.  Musculoskeletal: No clubbing cyanosis or edema.  Data Reviewed: Basic Metabolic Panel:  Recent Labs Lab 08/14/15 1102 08/15/15 0335  NA 135 136  K 3.7 3.0*  CL 108 108  CO2 15* 15*  GLUCOSE 136* 90  BUN 24* 21*  CREATININE 1.34* 0.87  CALCIUM 9.2 8.5*  MG  --  1.7  PHOS  --  4.1   Liver Function Tests:  Recent Labs Lab 08/14/15 1102 08/15/15 0335  AST 37 27  ALT 42 34  ALKPHOS 122 97  BILITOT 1.4* 1.0  PROT 7.4 5.8*  ALBUMIN 4.0 3.2*    Recent Labs Lab 08/14/15 1102  LIPASE 24   No results for input(s): AMMONIA in the last 168 hours. CBC:  Recent Labs Lab 08/14/15 1102 08/15/15 0335  WBC 9.9 7.4  NEUTROABS  --  3.4  HGB 16.0 13.4  HCT 45.7 39.1  MCV 71.1* 70.1*  PLT 328 230   Cardiac Enzymes: No results for input(s): CKTOTAL, CKMB, CKMBINDEX, TROPONINI in the last 168 hours. BNP (last 3 results) No results for input(s): BNP in the last 8760 hours.  ProBNP (last 3 results) No results for input(s): PROBNP in the last 8760 hours.  CBG: No results for input(s): GLUCAP in the  last 168 hours.  No results found for this or any previous visit (from the past 240 hour(s)).   Studies: Ct Abdomen Pelvis W Contrast  08/14/2015  CLINICAL DATA:  Abdominal pain with nausea EXAM: CT ABDOMEN AND PELVIS WITH CONTRAST TECHNIQUE: Multidetector CT imaging of the abdomen and pelvis was performed using the standard protocol following bolus administration of intravenous contrast.  CONTRAST:  100mL OMNIPAQUE IOHEXOL 300 MG/ML  SOLN COMPARISON:  CT abdomen pelvis 06/23/2015 FINDINGS: Lower chest: Negative for infiltrate or effusion. Bronchial wall thickening similar to the prior study and likely chronic. Hepatobiliary: Prior cholecystectomy. Intrahepatic and extrahepatic biliary dilatation improved from the prior study. Common bile duct now measures 8 mm in diameter. Question passed ductal stone. No pancreatic mass. No focal liver lesion Pancreas: Negative Spleen: Negative Adrenals/Urinary Tract: Symmetric excretion of contrast by both kidneys. No renal mass or obstruction. No renal calculi. Normal bladder. Stomach/Bowel: Fluid-filled large and small bowel loops. No dilated or thickened bowel loops. Terminal ileum normal. Appendix not definitely visualized. No evidence of acute appendicitis. Vascular/Lymphatic: Normal aorta.  No aneurysm.  No lymphadenopathy. Reproductive: Negative prostate Other: No free-fluid Musculoskeletal: Negative Image quality degraded by motion IMPRESSION: Fluid filled large and small bowel. No bowel dilatation or thickening. Question gastroenteritis or ileus. Biliary duct dilatation improved from the prior study. Question passed ductal stone. Bronchial wall thickening in the bases suggesting chronic bronchitis. Electronically Signed   By: Marlan Palauharles  Clark M.D.   On: 08/14/2015 21:13    Scheduled Meds: . ciprofloxacin  400 mg Intravenous Q12H  .  HYDROmorphone (DILAUDID) injection  1 mg Intravenous Once  . magnesium sulfate 1 - 4 g bolus IVPB  3 g Intravenous Once  . metronidazole  500 mg Intravenous Q8H  . nystatin  5 mL Oral TID AC & HS  . pantoprazole (PROTONIX) IV  40 mg Intravenous Q24H  . potassium chloride  40 mEq Oral Q4H  . sodium chloride  1,000 mL Intravenous Once   Continuous Infusions: . 0.9 % NaCl with KCl 20 mEq / L 125 mL/hr at 08/15/15 1043    Principal Problem:   Acute gastroenteritis Active Problems:   Hypokalemia   Diarrhea    Protein-calorie malnutrition, severe (HCC)   AIDS (acquired immunodeficiency syndrome), CD4 <=200 (HCC)   Thrush   Dehydration    Time spent: 35 minutes    THOMPSON,DANIEL M.D. [facility] Pager [phone]. If 7PM-7AM, please contact night-coverage at [email or web address], password [email or web address] 08/15/2015, 11:11 AM  LOS: 1 day

## 2015-08-15 NOTE — Consult Note (Signed)
[facility]    Date of Admission:  08/14/2015   Total days of antibiotics 1        Day 1 ciprofloxacin        Day 1 metronidazole        Day 1 fluconazole        Day 1 TMP-SMX       Reason for Consult: HIV patient with gastroenteritis    Referring Physician: Dr. 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 ciprofloxacin  400 mg Intravenous Q12H  . famotidine (PEPCID) IV  20 mg Intravenous Q24H  . fluconazole (DIFLUCAN) IV  200 mg Intravenous Q24H  .  HYDROmorphone (DILAUDID) injection  1 mg Intravenous Once  . metronidazole  500 mg Intravenous Q8H  . nystatin  5 mL Oral TID AC & HS    Recommendations: 1. Follow up GI panel PCR 2. Obtain stool smear for cryptosporidium/isospora and acid fast smear 3. Stop IV fluconazole and switch to fluconazole  PO daily for thrush and likely candidal esophagitis  4. Start PCP prophylaxis with TMP-SMX 1 DS tablet daily 5. Do not start weekly Azithromycin for MAC prophylaxis in case he has MAC and needs daily medication 6. Continue ciprofloxacin and metronidazole for possible bacterial gastroenteritis 7. Will need to get follow up with ID clinic to start ART  Assessment: 40 year old man with history of HIV/AIDS not on anti-retroviral therapy.  Repeat CD4 and viral loads are pending at this time.  He is presenting with a 6 day history of diarrheal illness in an immune compromised host but also had diarrheal symptoms back in January.  Etiology currently is unclear but the differential remains broad.  Patients with CD4 cell counts <100 cells/microL are at risk for opportunistic infections which are typically chronic, such as Cryptosporidium, MAC, CMV, Isospora, or Microsporidium.  Patient is not currently on HAART therapy so we  do not need to worry that his diarrhea is due to side effect from this.    In terms of empiric therapy, some clinicians prefer empiric antibiotic therapy with a quinolone and metronidzole to treat possible small bowel overgrowth, culture-negative Campylobacter, or Giardia. However, small bowel overgrowth as a cause of diarrhea is very rare and antibiotics may lead to adverse effects.  Stool examinations should be ordered for culture of bacteria, C. difficile toxin assay, and examination for ova and parasites.  An acid-fast smear should also be requested to look for Cryptosporidium, Isospora, and Cyclospora.  Fortunately, his C. Diff test was negative.  Patient has oral thrush present on buccal mucosa and might have esophageal candidiasis based on his poor immune system and complaint of pain with eating food and some odynophagia.  Patient also has cough with history of likely PCP and Strep pneumo pneumonia in October 2016.  He is currently doing well on room air and not having any further respiratory complaints other than his cough and sore throat.  CT scan showed some chronic bronchitic changes.   HPI: Gregery Walberg is a 40 y.o. male who is [place] speaking, originally from [place], [place] who has been in the [place] for 11 years.  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  He was originally diagnosed with HIV/AIDS early 2016 after laparoscopic  cholecystectomy.  He was supposed to start ART but did not follow up.  His CD4 at time of diagnosis was 10.  He was hospitalized in October for likely PCP and Strep pneumo pneumonia.  His CD4/VL at that time was 10/211,920.  After that discharge, he was to follow up with [facility] for treatment but unfortunately did not.  He was also started on opportunistic infection prophylaxis at discharge and took his medications until running out of them recently.  It appears that his lack of follow up may be due to transportation issues and not having adequate  support as he reports living alone and has not told anyone other than doctors about his HIV disease.  He is coming into the hospital this admission due to vomiting and diarrhea x 6 days with associated abdominal pain.  States the vomiting has improved but diarrhea has not.  He describes diarrhea as black and watery.  Abdominal pain is described as burning and located in left upper and lower quadrant.  He is unsure if anything makes it better or worse but is currently asking to eat lunch.  He denies any known sick contacts, no recent travel history, and no recent changes to his diet.  States he has had diarrhea in the past.  He was seen in the ED on 06/23/2015 for similar complaints of abdominal pain, vomiting, and dark black stool.  He also continues to have persistent cough productive of yellow sputum with no associated shortness of breath or hemoptysis.  He tells me that this cough has been present for about a month, but his ED visit from January also mentions him complaining of cough.   He states he has a sore throat that began when he was having episodes of vomiting.  He currently has some odynophagia and says that coughing and eating seem to make it worse.  CT scan of the abdomen showed possible gastroenteritis.  He was started on IV ciprofloxacin and IV metronidazole for this.  He was also started on Nystatin and fluconazole for thrush and suspected esophageal candidiasis.   Review of Systems: Review of Systems  Constitutional: Positive for fever and weight loss (reports this but has gained weight since Oct 2016). Negative for chills.  HENT: Positive for sore throat. Negative for congestion.   Respiratory: Positive for cough and sputum production. Negative for hemoptysis and shortness of breath.   Cardiovascular: Negative for chest pain and leg swelling.  Gastrointestinal: Positive for vomiting, abdominal pain and diarrhea. Negative for nausea.  Genitourinary: Negative for dysuria.  Musculoskeletal:  Negative for myalgias, back pain and joint pain.  Skin: Negative for rash.  Neurological: Positive for weakness. Negative for headaches.    Past Medical History  Diagnosis Date  . HIV disease [facility])     Social History  Substance Use Topics  . Smoking status: Never Smoker   . Smokeless tobacco: Never Used  . Alcohol Use: 0.0 oz/week    0 Standard drinks or equivalent per week     Comment: beer    History reviewed. No pertinent family history. Allergies  Allergen Reactions  . Penicillins Hives and Itching    Has patient had a PCN reaction causing immediate rash, facial/tongue/throat swelling, SOB or lightheadedness with hypotension: Yes Has patient had a PCN reaction causing severe rash involving mucus membranes or skin necrosis: No Has patient had a PCN reaction that required hospitalization No Has patient had a PCN reaction occurring within the last 10 years: No If all of the  above answers are "NO", then may proceed with Cephalosporin use.    OBJECTIVE: Blood pressure 96/70, pulse 57, temperature 97 F (36.1 C), temperature source Oral, resp. rate 18, height 5\' 7"  (1.702 m), weight 132 lb (59.875 kg), SpO2 100 %.  Physical Exam  Constitutional: He is oriented to person, place, and time.  Very pleasant, thin appearing, Spanish speaking man, asking about having lunch  HENT:  Head: Normocephalic and atraumatic.  Poor dentition, oral thrush present on buccal mucosa  Eyes: Conjunctivae and EOM are normal.  Neck: Normal range of motion. Neck supple.  Cardiovascular: Normal rate, regular rhythm, normal heart sounds and intact distal pulses.   Pulmonary/Chest: Effort normal and breath sounds normal. No respiratory distress. He has no wheezes. He exhibits no tenderness.  Abdominal: Soft. Bowel sounds are normal. He exhibits no distension. There is tenderness. There is no rebound and no guarding.  Musculoskeletal: He exhibits no edema.  + Clubbing of fingernails  Lymphadenopathy:      He has no cervical adenopathy.  Neurological: He is alert and oriented to person, place, and time. No cranial nerve deficit.  Skin: Skin is warm and dry. No rash noted.  Psychiatric: Affect normal.    Lab Results Lab Results  Component Value Date   WBC 7.4 08/15/2015   HGB 13.4 08/15/2015   HCT 39.1 08/15/2015   MCV 70.1* 08/15/2015   PLT 230 08/15/2015    Lab Results  Component Value Date   CREATININE 0.87 08/15/2015   BUN 21* 08/15/2015   NA 136 08/15/2015   K 3.0* 08/15/2015   CL 108 08/15/2015   CO2 15* 08/15/2015    Lab Results  Component Value Date   ALT 34 08/15/2015   AST 27 08/15/2015   ALKPHOS 97 08/15/2015   BILITOT 1.0 08/15/2015     Microbiology: Recent Results (from the past 240 hour(s))  C difficile quick scan w PCR reflex     Status: None   Collection Time: 08/15/15  9:40 AM  Result Value Ref Range Status   C Diff antigen NEGATIVE NEGATIVE Final   C Diff toxin NEGATIVE NEGATIVE Final   C Diff interpretation Negative for toxigenic C. difficile  Final    Gwynn BurlyAndrew Wallace, DO   08/15/2015, 4:02 PM  Addendum: I have seen and examined Mr. Ardyth HarpsHernandez with Dr. Earlene PlaterWallace. I agree with his assessment and plans. Mr. Ardyth HarpsHernandez has recurrent gastroenteritis most likely related to his advanced and currently untreated HIV infection. He has now had 4 trips to the ED and 3 admissions in the past year. When talking to him today about barriers to staying in care and our clinic is clear that he is uncertain how to navigate his care. He tells me that he has not shared the information about his HIV diagnosis with anyone else other than his care providers. He states that he lost his job and no longer has any insurance and he does not know how to obtain medications. He also states that he no longer has a car and therefore has difficulty with transportation. I'll trying to sort out the cause of his gastroenteritis will focus great effort on getting him reengaged with our  multidisciplinary team in clinic. He does have thrush but I do not think he is likely to have candidal esophagitis. We will follow-up tomorrow.  Cliffton AstersJohn Dietrich Ke, MD [facility] [phone] pager   [phone] cell 08/15/2015, 5:43 PM

## 2015-08-15 NOTE — Progress Notes (Signed)
Interpreter Wyvonnia DuskyGraciela Namihira for [facility] at  ED E [facility]

## 2015-08-16 ENCOUNTER — Telehealth: Payer: Self-pay | Admitting: *Deleted

## 2015-08-16 DIAGNOSIS — R1084 Generalized abdominal pain: Secondary | ICD-10-CM | POA: Insufficient documentation

## 2015-08-16 DIAGNOSIS — D649 Anemia, unspecified: Secondary | ICD-10-CM

## 2015-08-16 LAB — T-HELPER CELLS (CD4) COUNT (NOT AT ARMC): CD4 T Cell Abs: 20 /uL — ABNORMAL LOW (ref 400–2700)

## 2015-08-16 LAB — COMPREHENSIVE METABOLIC PANEL
ALBUMIN: 2.6 g/dL — AB (ref 3.5–5.0)
ALK PHOS: 77 U/L (ref 38–126)
ALT: 53 U/L (ref 17–63)
ANION GAP: 10 (ref 5–15)
AST: 82 U/L — ABNORMAL HIGH (ref 15–41)
BUN: 5 mg/dL — ABNORMAL LOW (ref 6–20)
CALCIUM: 8 mg/dL — AB (ref 8.9–10.3)
CHLORIDE: 108 mmol/L (ref 101–111)
CO2: 18 mmol/L — AB (ref 22–32)
Creatinine, Ser: 0.77 mg/dL (ref 0.61–1.24)
GFR calc non Af Amer: >60 mL/min (ref >60)
GLUCOSE: 93 mg/dL (ref 65–99)
Potassium: 3.2 mmol/L — ABNORMAL LOW (ref 3.5–5.1)
SODIUM: 136 mmol/L (ref 135–145)
Total Bilirubin: 0.9 mg/dL (ref 0.3–1.2)
Total Protein: 4.7 g/dL — ABNORMAL LOW (ref 6.5–8.1)

## 2015-08-16 LAB — CBC WITH DIFFERENTIAL/PLATELET
BASOS PCT: 1 %
Basophils Absolute: 0 10*3/uL (ref 0.0–0.1)
EOS ABS: 0.2 10*3/uL (ref 0.0–0.7)
EOS PCT: 5 %
HCT: 31.6 % — ABNORMAL LOW (ref 39.0–52.0)
HEMOGLOBIN: 10.8 g/dL — AB (ref 13.0–17.0)
Lymphocytes Relative: 11 %
Lymphs Abs: 0.5 10*3/uL — ABNORMAL LOW (ref 0.7–4.0)
MCH: 24.2 pg — AB (ref 26.0–34.0)
MCHC: 34.2 g/dL (ref 30.0–36.0)
MCV: 70.9 fL — ABNORMAL LOW (ref 78.0–100.0)
MONOS PCT: 5 %
Monocytes Absolute: 0.2 10*3/uL (ref 0.1–1.0)
NEUTROS PCT: 78 %
Neutro Abs: 3.4 10*3/uL (ref 1.7–7.7)
PLATELETS: 168 10*3/uL (ref 150–400)
RBC: 4.46 MIL/uL (ref 4.22–5.81)
RDW: 15.4 % (ref 11.5–15.5)
WBC: 4.4 10*3/uL (ref 4.0–10.5)

## 2015-08-16 LAB — FERRITIN: Ferritin: 252 ng/mL (ref 24–336)

## 2015-08-16 LAB — BASIC METABOLIC PANEL
ANION GAP: 6 (ref 5–15)
BUN: <5 mg/dL — ABNORMAL LOW (ref 6–20)
CHLORIDE: 107 mmol/L (ref 101–111)
CO2: 23 mmol/L (ref 22–32)
Calcium: 7.7 mg/dL — ABNORMAL LOW (ref 8.9–10.3)
Creatinine, Ser: 0.75 mg/dL (ref 0.61–1.24)
GFR calc non Af Amer: >60 mL/min (ref >60)
Glucose, Bld: 106 mg/dL — ABNORMAL HIGH (ref 65–99)
POTASSIUM: 3 mmol/L — AB (ref 3.5–5.1)
SODIUM: 136 mmol/L (ref 135–145)

## 2015-08-16 LAB — RPR: RPR Ser Ql: NONREACTIVE

## 2015-08-16 LAB — HIV-1 RNA QUANT-NO REFLEX-BLD
HIV 1 RNA QUANT: 391000 {copies}/mL
LOG10 HIV-1 RNA: 5.592 {Log_copies}/mL

## 2015-08-16 LAB — RETICULOCYTES
RBC.: 4.44 MIL/uL (ref 4.22–5.81)
Retic Count, Absolute: 22.2 10*3/uL (ref 19.0–186.0)
Retic Ct Pct: 0.5 % (ref 0.4–3.1)

## 2015-08-16 LAB — FOLATE: Folate: 6 ng/mL (ref >5.9)

## 2015-08-16 LAB — MAGNESIUM: Magnesium: 1.6 mg/dL — ABNORMAL LOW (ref 1.7–2.4)

## 2015-08-16 LAB — IRON AND TIBC
IRON: 27 ug/dL — AB (ref 45–182)
SATURATION RATIOS: 11 % — AB (ref 17.9–39.5)
TIBC: 245 ug/dL — AB (ref 250–450)
UIBC: 218 ug/dL

## 2015-08-16 LAB — VITAMIN B12: VITAMIN B 12: 318 pg/mL (ref 180–914)

## 2015-08-16 MED ORDER — POTASSIUM CHLORIDE CRYS ER 20 MEQ PO TBCR
40.0000 meq | EXTENDED_RELEASE_TABLET | ORAL | Status: AC
  Administered 2015-08-16 – 2015-08-17 (×2): 40 meq via ORAL
  Filled 2015-08-16 (×2): qty 2

## 2015-08-16 MED ORDER — PANTOPRAZOLE SODIUM 40 MG IV SOLR
40.0000 mg | INTRAVENOUS | Status: DC
  Administered 2015-08-16 – 2015-08-17 (×2): 40 mg via INTRAVENOUS
  Filled 2015-08-16 (×2): qty 40

## 2015-08-16 MED ORDER — POTASSIUM CHLORIDE CRYS ER 20 MEQ PO TBCR
40.0000 meq | EXTENDED_RELEASE_TABLET | ORAL | Status: AC
  Administered 2015-08-16 (×2): 40 meq via ORAL
  Filled 2015-08-16 (×2): qty 2

## 2015-08-16 MED ORDER — MAGNESIUM SULFATE 4 GM/100ML IV SOLN
4.0000 g | Freq: Once | INTRAVENOUS | Status: AC
  Administered 2015-08-16: 4 g via INTRAVENOUS
  Filled 2015-08-16: qty 100

## 2015-08-16 NOTE — Progress Notes (Signed)
Interpreter Graciela Namihira for patient °

## 2015-08-16 NOTE — Care Management Note (Addendum)
Case Management Note  Patient Details  Name: Jon Love MRN: [id number] Date of Birth: 12/23/1965  Subjective/Objective:                    Action/Plan:  [facility] August [facility], 2015 at 9 am .  [facility] at discharge , overrode $3 co payment on Bactrim , Cipro  , Diflucan , Flagyl , and  Azithromycin .   Spoke to [place]essa with SW , SW department unable to assist with transportation to MD appointments .  Expected Discharge Date:                  Expected Discharge Plan:  Home/Self Care  In-House Referral:     Discharge planning Services  [facility], [facility], Medication Assistance, CM Consult  Post Acute Care Choice:    Choice offered to:     DME Arranged:    DME Agency:     HH Arranged:    HH Agency:     Status of Service:  In process, will continue to follow  Medicare Important Message Given:    Date Medicare IM Given:    Medicare IM give by:    Date Additional Medicare IM Given:    Additional Medicare Important Message give by:     If discussed at Long Length of Stay Meetings, dates discussed:    Additional Comments:  Kingsley PlanWile, Ambriella Kitt Marie, RN 08/16/2015, 11:40 AM

## 2015-08-16 NOTE — Progress Notes (Signed)
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 ciprofloxacin  400 mg Intravenous Q12H  . famotidine (PEPCID) IV  20 mg Intravenous Q24H  . fluconazole  100 mg Oral Daily  .  HYDROmorphone (DILAUDID) injection  1 mg Intravenous Once  . magnesium sulfate 1 - 4 g bolus IVPB  4 g Intravenous Once  . metronidazole  500 mg Intravenous Q8H  . nystatin  5 mL Oral TID AC & HS  . potassium chloride  40 mEq Oral Q4H  . sulfamethoxazole-trimethoprim  1 tablet Oral Daily    SUBJECTIVE: [name] is feeling much better today.  He states his diarrhea has improved and he did not have to have a bowel movement last night.  He says his throat pain is better and did not bother him while eating this morning.  He is still having some upper quadrant abdominal pain in LUQ and epigastrically.   Review of Systems: Review of Systems  Constitutional: Negative for fever and chills.  Respiratory: Negative for cough and sputum production.   Cardiovascular: Negative for chest pain.  Gastrointestinal: Positive for abdominal pain. Negative for nausea, vomiting and diarrhea.  Musculoskeletal: Negative for myalgias.  Neurological: Negative for headaches.    Past Medical History  Diagnosis Date  . HIV disease [facility])     Social History  Substance Use Topics  . Smoking status: Never Smoker   . Smokeless tobacco: Never Used  . Alcohol Use: 0.0 oz/week    0 Standard drinks or equivalent per week     Comment: beer    History reviewed. No pertinent family history. Allergies  Allergen Reactions  . Penicillins Hives and Itching    Has patient had  a PCN reaction causing immediate rash, facial/tongue/throat swelling, SOB or lightheadedness with hypotension: Yes Has patient had a PCN reaction causing severe rash involving mucus membranes or skin necrosis: No Has patient had a PCN reaction that required hospitalization No Has patient had a PCN reaction occurring within the last 10 years: No If all of the above answers are "NO", then may proceed with Cephalosporin use.    OBJECTIVE: Filed Vitals:   08/15/15 1047 08/15/15 1457 08/15/15 2100 08/16/15 0453  BP: 91/55  Pulse: 63 57 79 67  Temp: 97 F (36.1 C) 97 F (36.1 C) 98.3 F (36.8 C) 98.7 F (37.1 C)  TempSrc: Oral Oral Oral Oral  Resp: Height:  (1.702 m)     Weight: 132 lb (59.875 kg)     SpO2: 100% 100% 98% 96%   Body mass index is 20.67 kg/(m^2).  Physical Exam  Constitutional: He is oriented to person, place, and time.  Spanish speaking man, very nice, sitting up in bed, comfortable appearing  HENT:  Head: Normocephalic and atraumatic.  Thrush present  Eyes: Conjunctivae are normal.  Neck: Normal range of motion.  Cardiovascular: Normal rate and regular rhythm.   Pulmonary/Chest: Effort  normal and breath sounds normal.  Abdominal: Soft. Bowel sounds are normal. There is tenderness (Epigastric improved from yesterday).  Musculoskeletal: He exhibits no edema.  Lymphadenopathy:    He has no cervical adenopathy.  Neurological: He is alert and oriented to person, place, and time.  Skin: Skin is warm and dry. No rash noted.  Psychiatric: Affect normal.    Lab Results Lab Results  Component Value Date   WBC 4.4 08/16/2015   HGB 10.8* 08/16/2015   HCT 31.6* 08/16/2015   MCV 70.9* 08/16/2015   PLT 168 08/16/2015    Lab Results  Component Value Date   CREATININE 0.77 08/16/2015   BUN 5* 08/16/2015   NA 136 08/16/2015   K 3.2* 08/16/2015   CL 108 08/16/2015   CO2 18* 08/16/2015    Lab Results  Component Value Date    ALT 53 08/16/2015   AST 82* 08/16/2015   ALKPHOS 77 08/16/2015   BILITOT 0.9 08/16/2015     Microbiology: Recent Results (from the past 240 hour(s))  C difficile quick scan w PCR reflex     Status: None   Collection Time: 08/15/15  9:40 AM  Result Value Ref Range Status   C Diff antigen NEGATIVE NEGATIVE Final   C Diff toxin NEGATIVE NEGATIVE Final   C Diff interpretation Negative for toxigenic C. difficile  Final  Gastrointestinal Panel by PCR , Stool     Status: Abnormal   Collection Time: 08/15/15  9:40 AM  Result Value Ref Range Status   Campylobacter species NOT DETECTED NOT DETECTED Final   Plesimonas shigelloides NOT DETECTED NOT DETECTED Final   Salmonella species NOT DETECTED NOT DETECTED Final   Yersinia enterocolitica NOT DETECTED NOT DETECTED Final   Vibrio species NOT DETECTED NOT DETECTED Final   Vibrio cholerae NOT DETECTED NOT DETECTED Final   Enteroaggregative E coli (EAEC) (A) NOT DETECTED Final    CRITICAL RESULT CALLED TO, READ BACK BY AND VERIFIED WITH:    Comment:  Purcell Nails AT 1828 08/15/15 SDR   Enteropathogenic E coli (EPEC) NOT DETECTED NOT DETECTED Final   Enterotoxigenic E coli (ETEC) NOT DETECTED NOT DETECTED Final   Shiga like toxin producing E coli (STEC) NOT DETECTED NOT DETECTED Final   E. coli O157 NOT DETECTED NOT DETECTED Final   Shigella/Enteroinvasive E coli (EIEC) NOT DETECTED NOT DETECTED Final   Cryptosporidium NOT DETECTED NOT DETECTED Final   Cyclospora cayetanensis NOT DETECTED NOT DETECTED Final   Entamoeba histolytica NOT DETECTED NOT DETECTED Final   Giardia lamblia NOT DETECTED NOT DETECTED Final   Adenovirus F40/41 NOT DETECTED NOT DETECTED Final   Astrovirus NOT DETECTED NOT DETECTED Final   Norovirus GI/GII NOT DETECTED NOT DETECTED Final   Rotavirus A NOT DETECTED NOT DETECTED Final   Sapovirus (I, II, IV, and V) NOT DETECTED NOT DETECTED Final     ASSESSMENT: 40 year old man with history of HIV infection not on ART.  CD4  count this admission is 20 and viral load has yet to result.  He is here for 6-day history of diarrheal illness leading up to admission but also had similar diarrheal symptoms back in January.  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  He also has oral thrush that was evident on the buccal mucosa at admission.  He seems to be improving currently on antibiotics and as also been started on fluconazole for his thrush and TMP-SMX for his PCP prophylaxis.  Acid fast smear is processing.  Per  UpToDate, "Several studies have demonstrated that diarrhea resolved when EAEC was cleared from the stool with antibiotic treatment. In a placebo-controlled trial including 29 patients with EAEC, treatment with ciprofloxacin reduced the duration of diarrhea compared to placebo (35 versus 56 hours). In a cross-over trial of 24 HIV-infected patients with EAEC, treatment with ciprofloxacin eradicated the organism from stool and reduced daily stool output by 50 percent."    PLAN: 1. Continue ciprofloxacin for 3-5 days.  Consider switching to ciprofloxacin 500mg  PO BID at discharge 2. Discontinue metronidazole 3. Continue fluconazole 100mg  PO daily for 14 days 4. Continue TMP-SMX for PCP prophylaxis 5. If AFB does not show MAC, consider starting weekly azithromycin for prophylaxis 6. Case management assistance to make sure patient has access to these medications prior to leaving 7. [facility] follow up to get started on ART  Gwynn Burlyndrew Wallace, DO   08/16/2015, 9:36 AM   Addendum: Mr. Jon HarpsHernandez appears to have acute diarrhea due to an Enteroaggregative E. coli. This has certainly been described in patients with HIV infection. He is improving on therapy with ciprofloxacin. Hopefully he can get 3 more days worth of ciprofloxacin. That should be sufficient to cure his infection. My nurse, Jennet MaduroDenise Estridge, discovered that he still has certification for the [facility] assistance program (ADAP) through the end of  this month. That will not cover ciprofloxacin but will cover his trimethoprim sulfamethoxazole and fluconazole. Once arrangements for these medications have been made he can be discharged. I have arranged a follow-up visit with my partner, Dr. Ninetta LightsHatcher, on 08/24/2015. We will do our best to help him arrange transportation to that visit. I will sign off now but please call if I can be of further assistance while he is here.  Cliffton AstersJohn Ji Fairburn, MD [facility] [phone] pager   [phone] cell 08/16/2015, 3:45 PM

## 2015-08-16 NOTE — Telephone Encounter (Signed)
Kandee Keen, [facility] Counselor, met with the pt.  Completed RW/ADAP paperwork.  Kandee Keen arranged to transport the pt to the [facility] appt 08/24/15 w/ Dr. Johnnye Sima at 0900.  RW/ADAP paperwork signed by Dr. Linus Salmons this afternoon and will meet "emergency" ADAP criteria to be approved based on CD4 count.  Mariah Milling, [employer], provided participant paperwork to sign during office visit on 08/24/15.

## 2015-08-16 NOTE — Care Management Note (Signed)
Case Management Note  Patient Details  Name: Jon Love MRN: [id number] Date of Birth: 12/23/1965  Subjective/Objective:                    Action/Plan:  Will provide medication assistance letter [facility]( MATCH) on day of discharge. Patient will have to follow up at ID clinic for ART however.  Will ask SW if they can provide any assistance for transportation for MD appointments. Expected Discharge Date:                  Expected Discharge Plan:  Home/Self Care  In-House Referral:     Discharge planning Services  [facility], [facility], Medication Assistance, CM Consult  Post Acute Care Choice:    Choice offered to:     DME Arranged:    DME Agency:     HH Arranged:    HH Agency:     Status of Service:  In process, will continue to follow  Medicare Important Message Given:    Date Medicare IM Given:    Medicare IM give by:    Date Additional Medicare IM Given:    Additional Medicare Important Message give by:     If discussed at Long Length of Stay Meetings, dates discussed:    Additional Comments:  Kingsley PlanWile, Alyssia Heese Marie, RN 08/16/2015, 8:10 AM

## 2015-08-16 NOTE — Telephone Encounter (Signed)
Pt needing [facility], ADAP/ RW as soon as possible.  Per Dr. Orvan Falconerampbell the pt will only start taking diflucan 100 MG daily and septra DS one tablet daily at discharge.  [facility].  RN will pick up medication this PM to deliver to the patient.  Will take [facility] paperwork to hospital for pt to sign.  Waiting an return call from [facility] Counsel[facility].

## 2015-08-16 NOTE — Progress Notes (Signed)
[facility] PROGRESS NOTE  Jon Love WUJ:[id number] DOB: 12/23/1965 DOA: 08/14/2015 PCP: No PCP Per Patient  Assessment/Plan: #1 diarrhea/acute gastroenteritis secondary to Enteroaggregate Escherichia coli Questionable etiology. Clinical improvement. Patient with history of AIDS last CD4 count was 10 03/16/2015. Viral load was 211920. Repeat CD4 count of 20 and viral load of 391000. Per admitting physician patient never followed up with ID in the outpatient setting. C. difficile PCR is negative. GI pathogen panel positive for Enteroaggregative Escherichia coli. Discontinue IV Flagyl. Continue IV ciprofloxacin, IV fluids, antiemetics, supportive care.  #2 hypokalemia/hypomagnesemia Secondary to GI losses. Replete.  #3 dehydration IV fluids.  #4 acidosis Likely secondary to diarrhea. Improving. Continue bicarbonate.  #5 AIDS Last CD4 count was 10 on 03/16/2015 with a viral load of 211920. Repeat CD4 count 20 and viral load of 391000. Patient did not follow-up in the outpatient setting with ID. patient has been seen by infectious diseases and patient started on prophylactic Bactrim DS daily. Azithromycin on hold pending Mycobacterium studies on stool. Case manager helping with setting patient up for the match program and [facility]. ID following and appreciate input and recommendations.   #6 oral thrush/probable Candida esophagitis Patient on nystatin. Admitting physician and stated that patient had some complaints of odynophagia and may have esophageal candidiasis. Continue Diflucan and treat for 2 weeks.  #7 severe protein calorie malnutrition Will likely need to be on nutritional supplementations when tolerating oral intake.  #8 anemia Likely dilutional component and also likely chronic secondary to AIDS. Check an anemia panel. Follow H&H.  #9 prophylaxis Protonix for GI prophylaxis. SCDs for  DVT prophylaxis.  Code Status: Full Family Communication: Updated patient. No family at bedside. Disposition Plan: Remain inpatient. Home once diarrhea has resolved and electrolyte abnormalities have corrected with clinical improvement.   Consultants:  Infectious diseases: Dr Orvan Falconerampbell 08/15/2015  Procedures:  CT abdomen and pelvis 08/14/2015    Antibiotics:  IV Flagyl 08/15/2015>>>>>>08/16/2015  IV ciprofloxacin 08/15/2015  HPI/Subjective: Patient states feeling better. Patient states had 1 loose stool today. Patient c/o back pain. No dizziness.  Objective: Filed Vitals:   08/15/15 2100 08/16/15 0453  BP: 97/60 91/55  Pulse: 79 67  Temp: 98.3 F (36.8 C) 98.7 F (37.1 C)  Resp: 18 18    Intake/Output Summary (Last 24 hours) at 08/16/15 1200 Last data filed at 08/16/15 0600  Gross per 24 hour  Intake   2040 ml  Output    130 ml  Net   1910 ml   Filed Weights   08/15/15 1047  Weight: 59.875 kg (132 lb)    Exam:   General:  NAD  Cardiovascular: RRR  Respiratory: CTAB  Abdomen: Nondistended, positive bowel sounds, some tenderness to palpation in the epigastrium, no rebound, no guarding.  Musculoskeletal: No clubbing cyanosis or edema.  Data Reviewed: Basic Metabolic Panel:  Recent Labs Lab 08/14/15 1102 08/15/15 0335 08/16/15 0727  NA 135 136 136  K 3.7 3.0* 3.2*  CL 108 108 108  CO2 15* 15* 18*  GLUCOSE 136* 90 93  BUN 24* 21* 5*  CREATININE 1.34* 0.87 0.77  CALCIUM 9.2 8.5* 8.0*  MG  --  1.7 1.6*  PHOS  --  4.1  --    Liver Function Tests:  Recent Labs Lab 08/14/15 1102 08/15/15 0335 08/16/15 0727  AST 37 27 82*  ALT 42 34 53  ALKPHOS 122 97 77  BILITOT 1.4* 1.0 0.9  PROT 7.4 5.8* 4.7*  ALBUMIN 4.0 3.2* 2.6*    Recent Labs Lab 08/14/15 1102  LIPASE 24   No results for input(s): AMMONIA in the last 168 hours. CBC:  Recent Labs Lab 08/14/15 1102 08/15/15 0335 08/16/15 0727  WBC 9.9 7.4 4.4  NEUTROABS  --  3.4 3.4   HGB 16.0 13.4 10.8*  HCT 45.7 39.1 31.6*  MCV 71.1* 70.1* 70.9*  PLT 328 230 168   Cardiac Enzymes: No results for input(s): CKTOTAL, CKMB, CKMBINDEX, TROPONINI in the last 168 hours. BNP (last 3 results) No results for input(s): BNP in the last 8760 hours.  ProBNP (last 3 results) No results for input(s): PROBNP in the last 8760 hours.  CBG: No results for input(s): GLUCAP in the last 168 hours.  Recent Results (from the past 240 hour(s))  C difficile quick scan w PCR reflex     Status: None   Collection Time: 08/15/15  9:40 AM  Result Value Ref Range Status   C Diff antigen NEGATIVE NEGATIVE Final   C Diff toxin NEGATIVE NEGATIVE Final   C Diff interpretation Negative for toxigenic C. difficile  Final  Gastrointestinal Panel by PCR , Stool     Status: Abnormal   Collection Time: 08/15/15  9:40 AM  Result Value Ref Range Status   Campylobacter species NOT DETECTED NOT DETECTED Final   Plesimonas shigelloides NOT DETECTED NOT DETECTED Final   Salmonella species NOT DETECTED NOT DETECTED Final   Yersinia enterocolitica NOT DETECTED NOT DETECTED Final   Vibrio species NOT DETECTED NOT DETECTED Final   Vibrio cholerae NOT DETECTED NOT DETECTED Final   Enteroaggregative E coli (EAEC) (A) NOT DETECTED Final    CRITICAL RESULT CALLED TO, READ BACK BY AND VERIFIED WITH:    Comment:  Purcell Nails AT 1828 08/15/15 SDR   Enteropathogenic E coli (EPEC) NOT DETECTED NOT DETECTED Final   Enterotoxigenic E coli (ETEC) NOT DETECTED NOT DETECTED Final   Shiga like toxin producing E coli (STEC) NOT DETECTED NOT DETECTED Final   E. coli O157 NOT DETECTED NOT DETECTED Final   Shigella/Enteroinvasive E coli (EIEC) NOT DETECTED NOT DETECTED Final   Cryptosporidium NOT DETECTED NOT DETECTED Final   Cyclospora cayetanensis NOT DETECTED NOT DETECTED Final   Entamoeba histolytica NOT DETECTED NOT DETECTED Final   Giardia lamblia NOT DETECTED NOT DETECTED Final   Adenovirus F40/41 NOT DETECTED NOT  DETECTED Final   Astrovirus NOT DETECTED NOT DETECTED Final   Norovirus GI/GII NOT DETECTED NOT DETECTED Final   Rotavirus A NOT DETECTED NOT DETECTED Final   Sapovirus (I, II, IV, and V) NOT DETECTED NOT DETECTED Final     Studies: Ct Abdomen Pelvis W Contrast  08/14/2015  CLINICAL DATA:  Abdominal pain with nausea EXAM: CT ABDOMEN AND PELVIS WITH CONTRAST TECHNIQUE: Multidetector CT imaging of the abdomen and pelvis was performed using the standard protocol following bolus administration of intravenous contrast. CONTRAST:  OMNIPAQUE IOHEXOL 300 MG/ML  SOLN COMPARISON:  CT abdomen pelvis 06/23/2015 FINDINGS: Lower chest: Negative for infiltrate or effusion. Bronchial wall thickening similar to the prior study and likely chronic. Hepatobiliary: Prior cholecystectomy. Intrahepatic and extrahepatic biliary dilatation improved from the prior study. Common bile duct now measures 8 mm in diameter. Question passed ductal stone. No pancreatic mass. No focal liver lesion Pancreas: Negative Spleen: Negative Adrenals/Urinary Tract: Symmetric excretion of contrast by both kidneys. No renal mass or obstruction. No renal calculi. Normal bladder. Stomach/Bowel: Fluid-filled large and small bowel loops. No  dilated or thickened bowel loops. Terminal ileum normal. Appendix not definitely visualized. No evidence of acute appendicitis. Vascular/Lymphatic: Normal aorta.  No aneurysm.  No lymphadenopathy. Reproductive: Negative prostate Other: No free-fluid Musculoskeletal: Negative Image quality degraded by motion IMPRESSION: Fluid filled large and small bowel. No bowel dilatation or thickening. Question gastroenteritis or ileus. Biliary duct dilatation improved from the prior study. Question passed ductal stone. Bronchial wall thickening in the bases suggesting chronic bronchitis. Electronically Signed   By: Marlan Palau M.D.   On: 08/14/2015 21:13    Scheduled Meds: . ciprofloxacin  400 mg Intravenous Q12H  .  famotidine (PEPCID) IV  20 mg Intravenous Q24H  . fluconazole  100 mg Oral Daily  .  HYDROmorphone (DILAUDID) injection  1 mg Intravenous Once  . magnesium sulfate 1 - 4 g bolus IVPB  4 g Intravenous Once  . nystatin  5 mL Oral TID AC & HS  . potassium chloride  40 mEq Oral Q4H  . sulfamethoxazole-trimethoprim  1 tablet Oral Daily   Continuous Infusions: .  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 [facility] Pager [phone]. If 7PM-7AM, please contact night-coverage at [email or web address], password [facility] 08/16/2015, 12:00 PM  LOS: 2 days

## 2015-08-17 LAB — CBC WITH DIFFERENTIAL/PLATELET
BASOS ABS: 0 10*3/uL (ref 0.0–0.1)
Basophils Relative: 0 %
EOS ABS: 0.4 10*3/uL (ref 0.0–0.7)
EOS PCT: 11 %
HCT: 31.2 % — ABNORMAL LOW (ref 39.0–52.0)
Hemoglobin: 10.3 g/dL — ABNORMAL LOW (ref 13.0–17.0)
LYMPHS ABS: 0.9 10*3/uL (ref 0.7–4.0)
Lymphocytes Relative: 26 %
MCH: 23.3 pg — AB (ref 26.0–34.0)
MCHC: 33 g/dL (ref 30.0–36.0)
MCV: 70.6 fL — ABNORMAL LOW (ref 78.0–100.0)
MONO ABS: 0.2 10*3/uL (ref 0.1–1.0)
Monocytes Relative: 6 %
Neutro Abs: 1.9 10*3/uL (ref 1.7–7.7)
Neutrophils Relative %: 57 %
PLATELETS: 173 10*3/uL (ref 150–400)
RBC: 4.42 MIL/uL (ref 4.22–5.81)
RDW: 15.1 % (ref 11.5–15.5)
WBC: 3.3 10*3/uL — AB (ref 4.0–10.5)

## 2015-08-17 LAB — BASIC METABOLIC PANEL
Anion gap: 9 (ref 5–15)
CALCIUM: 7.7 mg/dL — AB (ref 8.9–10.3)
CO2: 27 mmol/L (ref 22–32)
Chloride: 102 mmol/L (ref 101–111)
Creatinine, Ser: 0.73 mg/dL (ref 0.61–1.24)
GFR calc Af Amer: >60 mL/min (ref >60)
GLUCOSE: 89 mg/dL (ref 65–99)
POTASSIUM: 3.8 mmol/L (ref 3.5–5.1)
SODIUM: 138 mmol/L (ref 135–145)

## 2015-08-17 LAB — ACID FAST SMEAR (AFB, MYCOBACTERIA): Acid Fast Smear: NEGATIVE

## 2015-08-17 LAB — MAGNESIUM: MAGNESIUM: 1.8 mg/dL (ref 1.7–2.4)

## 2015-08-17 MED ORDER — SODIUM CHLORIDE 0.9 % IV SOLN
INTRAVENOUS | Status: AC
  Administered 2015-08-17 – 2015-08-18 (×3): via INTRAVENOUS

## 2015-08-17 MED ORDER — TRAMADOL HCL 50 MG PO TABS: 50.0000 mg | ORAL_TABLET | Freq: Four times a day (QID) | ORAL | Status: DC | PRN

## 2015-08-17 MED ORDER — PANTOPRAZOLE SODIUM 40 MG PO TBEC
40.0000 mg | DELAYED_RELEASE_TABLET | Freq: Two times a day (BID) | ORAL | Status: DC
  Administered 2015-08-17 – 2015-08-18 (×2): 40 mg via ORAL
  Filled 2015-08-17 (×2): qty 1

## 2015-08-17 MED ORDER — MAGNESIUM SULFATE 2 GM/50ML IV SOLN
2.0000 g | Freq: Once | INTRAVENOUS | Status: AC
  Administered 2015-08-17: 2 g via INTRAVENOUS
  Filled 2015-08-17: qty 50

## 2015-08-17 MED ORDER — PANTOPRAZOLE SODIUM 40 MG PO TBEC: 40.0000 mg | DELAYED_RELEASE_TABLET | Freq: Every day | ORAL | Status: DC

## 2015-08-17 MED ORDER — CIPROFLOXACIN HCL 500 MG PO TABS
500.0000 mg | ORAL_TABLET | Freq: Two times a day (BID) | ORAL | Status: DC
  Administered 2015-08-17 – 2015-08-18 (×4): 500 mg via ORAL
  Filled 2015-08-17 (×4): qty 1

## 2015-08-17 MED ORDER — HYDROCODONE-HOMATROPINE 5-1.5 MG/5ML PO SYRP
5.0000 mL | ORAL_SOLUTION | Freq: Four times a day (QID) | ORAL | Status: DC | PRN
  Administered 2015-08-17 – 2015-08-18 (×2): 5 mL via ORAL
  Filled 2015-08-17 (×2): qty 5

## 2015-08-17 MED ORDER — SODIUM CHLORIDE 0.9 % IV SOLN: INTRAVENOUS | Status: DC

## 2015-08-17 NOTE — Progress Notes (Signed)
Patient ID: Jon Love, male   DOB: Jan 31, 1976, 40 y.o.   MRN: [id number]         [facility]    Date of Admission:  08/14/2015           Day 3 ciprofloxacin  His diarrhea has improved. He needs 2 more days of ciprofloxacin. Patient stayed on prophylactic trimethoprim sulfamethoxazole and once daily fluconazole. Transportation has been arranged to help him get to an appointment in our clinic in 1 week. I will sign off now.       [name], MD [facility] [phone] pager   [phone] cell 06/12/2015, 1:32 PM

## 2015-08-17 NOTE — Progress Notes (Signed)
Interpreter Wyvonnia DuskyGraciela Namihira for [facility].

## 2015-08-17 NOTE — Care Management Note (Signed)
Case Management Note  Patient Details  Name: Jon Love MRN: [id number] Date of Birth: July 01, 1975  Subjective/Objective:                    Action/Plan: MATCH letter [facility] information given to patient via interpreter [name] . Patient voiced understanding.  Expected Discharge Date:                  Expected Discharge Plan:  Home/Self Care  In-House Referral:     Discharge planning Services  [facility], Medication Assistance, CM Consult  Post Acute Care Choice:    Choice offered to:     DME Arranged:    DME Agency:     HH Arranged:    HH Agency:     Status of Service:  Completed, signed off  Medicare Important Message Given:    Date Medicare IM Given:    Medicare IM give by:    Date Additional Medicare IM Given:    Additional Medicare Important Message give by:     If discussed at Long Length of Stay Meetings, dates discussed:    Additional Comments:  Kingsley PlanWile, Sherica Paternostro Marie, RN 08/17/2015, 2:51 PM

## 2015-08-17 NOTE — Progress Notes (Signed)
At 0900 patient passes Egress, up ad lib in room.

## 2015-08-17 NOTE — Progress Notes (Signed)
[facility] PROGRESS NOTE  Jon Love ZOX:[id number] DOB: [birth year] DOA: 08/14/2015 PCP: No PCP Per Patient  Assessment/Plan: #1 diarrhea/acute gastroenteritis secondary to Enteroaggregate Escherichia coli Questionable etiology. Clinical improvement. Patient with history of AIDS last CD4 count was 10 03/16/2015. Viral load was 211920. Repeat CD4 count of 20 and viral load of 391000. Per admitting physician patient never followed up with ID in the outpatient setting. C. difficile PCR is negative. GI pathogen panel positive for Enteroaggregative Escherichia coli. Discontinued IV Flagyl. Change IV ciprofloxacin to oral ciprofloxacin. Patient needs 3 more days of oral ciprofloxacin per ID. Continue IV fluids, antiemetics, supportive care.  #2 hypokalemia/hypomagnesemia Secondary to GI losses. Repleted.  #3 dehydration Patient with complaints of dizziness. Patient's blood pressure is borderline. Continue IV fluids.  #4 acidosis Likely secondary to diarrhea. Resolved. Discontinue bicarbonate drip.   #5 AIDS Last CD4 count was 10 on 03/16/2015 with a viral load of 211920. Repeat CD4 count 20 and viral load of 391000. Patient did not follow-up in the outpatient setting with ID. patient has been seen by infectious diseases and patient started on prophylactic Bactrim DS daily. Azithromycin on hold pending Mycobacterium studies on stool. Case manager helping with setting patient up for the match program and [facility]. ID following and appreciate input and recommendations.   #6 oral thrush/probable Candida esophagitis Patient on nystatin. Admitting physician and stated that patient had some complaints of odynophagia and may have esophageal candidiasis. Clinical improvement. Continue Diflucan and treat for 2 weeks.  #7 severe protein calorie malnutrition Will likely need to be on nutritional supplementations  when tolerating oral intake.  #8 anemia Likely dilutional component and also likely chronic secondary to AIDS Follow H&H.  #9 prophylaxis Protonix for GI prophylaxis. SCDs for DVT prophylaxis.  Code Status: Full Family Communication: Updated patient via interpreter. No family at bedside. Disposition Plan: Remain inpatient. Home once diarrhea has resolved and electrolyte abnormalities have corrected with clinical improvement, hopefully tomorrow.   Consultants:  Infectious diseases: Dr Orvan Falconer 08/15/2015  Procedures:  CT abdomen and pelvis 08/14/2015    Antibiotics:  IV Flagyl 08/15/2015>>>>>>08/16/2015  IV ciprofloxacin 08/15/2015>>>>oral cipro 08/17/2015  HPI/Subjective: Patient states feeling better. Patient states had 3 loose stools yesterday and 1 semi formed stool today. No abdominal pain. Patient c/o coughing excessively at night. No CP. No SOB. Patient c/o back pain. Patient c/o some dizziness. Swallow difficulties improving. Patient with some complaints of dizziness.  Objective: Filed Vitals:   08/17/15 1000 08/17/15 1356  BP: 104/60 93/59  Pulse:  62  Temp:  98.2 F (36.8 C)  Resp:  18    Intake/Output Summary (Last 24 hours) at 08/17/15 1540 Last data filed at 08/17/15 1358  Gross per 24 hour  Intake   2129 ml  Output    300 ml  Net   1829 ml   Filed Weights   08/15/15 1047  Weight: 59.875 kg (132 lb)    Exam:   General:  NAD  Cardiovascular: RRR  Respiratory: CTAB  Abdomen: Nondistended, positive bowel sounds, some tenderness to palpation in the epigastrium, no rebound, no guarding.  Musculoskeletal: No clubbing cyanosis or edema.  Data Reviewed: Basic Metabolic Panel:  Recent Labs Lab 08/14/15 1102 08/15/15 0335 08/16/15 0727 08/16/15 1205 08/17/15 0445  NA 135 136 136 136 138  K 3.7 3.0* 3.2* 3.0* 3.8  CL 108 108 108 107 102  CO2 15* 15* 18* 23 27  GLUCOSE  136* 90 93 106* 89  BUN 24* 21* 5* <5* <5*  CREATININE 1.34* 0.87 0.77  0.75 0.73  CALCIUM 9.2 8.5* 8.0* 7.7* 7.7*  MG  --  1.7 1.6*  --  1.8  PHOS  --  4.1  --   --   --    Liver Function Tests:  Recent Labs Lab 08/14/15 1102 08/15/15 0335 08/16/15 0727  AST 37 27 82*  ALT 42 34 53  ALKPHOS 122 97 77  BILITOT 1.4* 1.0 0.9  PROT 7.4 5.8* 4.7*  ALBUMIN 4.0 3.2* 2.6*    Recent Labs Lab 08/14/15 1102  LIPASE 24   No results for input(s): AMMONIA in the last 168 hours. CBC:  Recent Labs Lab 08/14/15 1102 08/15/15 0335 08/16/15 0727 08/17/15 0445  WBC 9.9 7.4 4.4 3.3*  NEUTROABS  --  3.4 3.4 1.9  HGB 16.0 13.4 10.8* 10.3*  HCT 45.7 39.1 31.6* 31.2*  MCV 71.1* 70.1* 70.9* 70.6*  PLT 328 230 168 173   Cardiac Enzymes: No results for input(s): CKTOTAL, CKMB, CKMBINDEX, TROPONINI in the last 168 hours. BNP (last 3 results) No results for input(s): BNP in the last 8760 hours.  ProBNP (last 3 results) No results for input(s): PROBNP in the last 8760 hours.  CBG: No results for input(s): GLUCAP in the last 168 hours.  Recent Results (from the past 240 hour(s))  C difficile quick scan w PCR reflex     Status: None   Collection Time: 08/15/15  9:40 AM  Result Value Ref Range Status   C Diff antigen NEGATIVE NEGATIVE Final   C Diff toxin NEGATIVE NEGATIVE Final   C Diff interpretation Negative for toxigenic C. difficile  Final  Gastrointestinal Panel by PCR , Stool     Status: Abnormal   Collection Time: 08/15/15  9:40 AM  Result Value Ref Range Status   Campylobacter species NOT DETECTED NOT DETECTED Final   Plesimonas shigelloides NOT DETECTED NOT DETECTED Final   Salmonella species NOT DETECTED NOT DETECTED Final   Yersinia enterocolitica NOT DETECTED NOT DETECTED Final   Vibrio species NOT DETECTED NOT DETECTED Final   Vibrio cholerae NOT DETECTED NOT DETECTED Final   Enteroaggregative E coli (EAEC) (A) NOT DETECTED Final    CRITICAL RESULT CALLED TO, READ BACK BY AND VERIFIED WITH:    CommentPurcell Nails AT 1828 08/15/15 SDR    Enteropathogenic E coli (EPEC) NOT DETECTED NOT DETECTED Final   Enterotoxigenic E coli (ETEC) NOT DETECTED NOT DETECTED Final   Shiga like toxin producing E coli (STEC) NOT DETECTED NOT DETECTED Final   E. coli O157 NOT DETECTED NOT DETECTED Final   Shigella/Enteroinvasive E coli (EIEC) NOT DETECTED NOT DETECTED Final   Cryptosporidium NOT DETECTED NOT DETECTED Final   Cyclospora cayetanensis NOT DETECTED NOT DETECTED Final   Entamoeba histolytica NOT DETECTED NOT DETECTED Final   Giardia lamblia NOT DETECTED NOT DETECTED Final   Adenovirus F40/41 NOT DETECTED NOT DETECTED Final   Astrovirus NOT DETECTED NOT DETECTED Final   Norovirus GI/GII NOT DETECTED NOT DETECTED Final   Rotavirus A NOT DETECTED NOT DETECTED Final   Sapovirus (I, II, IV, and V) NOT DETECTED NOT DETECTED Final  Acid Fast Smear (AFB)     Status: None   Collection Time: 08/15/15  8:24 PM  Result Value Ref Range Status   AFB Specimen Processing Concentration  Final   Acid Fast Smear Negative  Final    Comment: (NOTE) Performed At: [facility]   [address] [place], [place] [id number] Mila HomerHancock William F MD WU:[id number]:[phone]    Source (AFB) STOOL  Final     Studies: No results found.  Scheduled Meds: . ciprofloxacin  500 mg Oral BID  . fluconazole  100 mg Oral Daily  .  HYDROmorphone (DILAUDID) injection  1 mg Intravenous Once  . nystatin  5 mL Oral TID AC & HS  . [START ON 08/18/2015] pantoprazole  40 mg Oral Daily  . sulfamethoxazole-trimethoprim  1 tablet Oral Daily   Continuous Infusions: . 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 [facility] Pager [phone]. If 7PM-7AM, please contact  night-coverage at [email or web address], password [email or web address] 08/17/2015, 3:40 PM  LOS: 3 days

## 2015-08-18 DIAGNOSIS — R197 Diarrhea, unspecified: Secondary | ICD-10-CM | POA: Insufficient documentation

## 2015-08-18 LAB — BASIC METABOLIC PANEL
ANION GAP: 7 (ref 5–15)
BUN: <5 mg/dL — ABNORMAL LOW (ref 6–20)
CALCIUM: 7.8 mg/dL — AB (ref 8.9–10.3)
CHLORIDE: 102 mmol/L (ref 101–111)
CO2: 31 mmol/L (ref 22–32)
CREATININE: 0.71 mg/dL (ref 0.61–1.24)
GFR calc non Af Amer: >60 mL/min (ref >60)
GLUCOSE: 94 mg/dL (ref 65–99)
Potassium: 3.7 mmol/L (ref 3.5–5.1)
Sodium: 140 mmol/L (ref 135–145)

## 2015-08-18 LAB — MAGNESIUM: Magnesium: 1.6 mg/dL — ABNORMAL LOW (ref 1.7–2.4)

## 2015-08-18 MED ORDER — FLUCONAZOLE 100 MG PO TABS: 100.0000 mg | ORAL_TABLET | Freq: Every day | ORAL | Status: AC

## 2015-08-18 MED ORDER — SULFAMETHOXAZOLE-TRIMETHOPRIM 800-160 MG PO TABS: 1.0000 | ORAL_TABLET | Freq: Every day | ORAL | Status: DC

## 2015-08-18 MED ORDER — MAGNESIUM SULFATE 4 GM/100ML IV SOLN
4.0000 g | Freq: Once | INTRAVENOUS | Status: AC
  Administered 2015-08-18: 4 g via INTRAVENOUS
  Filled 2015-08-18: qty 100

## 2015-08-18 MED ORDER — PANTOPRAZOLE SODIUM 40 MG PO TBEC: 40.0000 mg | DELAYED_RELEASE_TABLET | Freq: Two times a day (BID) | ORAL | Status: DC

## 2015-08-18 MED ORDER — CIPROFLOXACIN HCL 500 MG PO TABS: 500.0000 mg | ORAL_TABLET | Freq: Two times a day (BID) | ORAL | Status: DC

## 2015-08-18 MED ORDER — NYSTATIN 100000 UNIT/ML MT SUSP: 5.0000 mL | Freq: Three times a day (TID) | OROMUCOSAL | Status: DC

## 2015-08-18 MED ORDER — TRAMADOL HCL 50 MG PO TABS: 50.0000 mg | ORAL_TABLET | Freq: Four times a day (QID) | ORAL | Status: DC | PRN

## 2015-08-18 MED ORDER — HYDROCODONE-HOMATROPINE 5-1.5 MG/5ML PO SYRP: 5.0000 mL | ORAL_SOLUTION | Freq: Four times a day (QID) | ORAL | Status: DC | PRN

## 2015-08-18 NOTE — Discharge Planning (Signed)
Given eve dose of Cipro prior to dc. AVS given in [place] and [place], family member fluent in [place] and helped to interpret instructions. Has meds that ID brought to him.  Patient verbalizes understanding and wants to thank everyone for his care. dc'd ambulatory to private car home with all personal belongings at 1640.

## 2015-08-18 NOTE — Discharge Summary (Signed)
Physician Discharge Summary  Jon Love ZOX:[id number] DOB: [birth year] DOA: 08/14/2015  PCP: No PCP Per Patient  Admit date: 08/14/2015 Discharge date: 08/18/2015  Time spent: 65 minutes  Recommendations for Outpatient Follow-up:  1. Follow-up with Dr. Ninetta Lights of infectious diseases on 08/24/2015. 2. Follow-up of the [facility] on 08/24/2015.   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 male with a past medical history of AIDS last CD4 count was less than 1, viral load 212 K in October 2016 who came to the emergency department with complaints of abdominal pain, associated with diarrhea, nausea, vomiting, fatigue and malaise for 5 days.  Per patient, about 5 days prior to admission, he developed epigastric abdominal pain which was followed by about 5 episodes of diarrhea/per day and about 2-3 episodes of nausea/emesis per day since then. He denied fever, but complained of chills and fatigue. He denied travel history or sick contacts. He is not suspicious of eating any particular food items which may have given him the symptoms.  He also complained of sore throat and odynophagia. He denied headache, dyspnea, night sweats, hemoptysis, but complained of a productive cough of yellowish sputum for the past 2 days.   Hospital Course:  #1 diarrhea/acute gastroenteritis secondary to Enteroaggregate Escherichia coli Patient with history of AIDS last CD4 count was 10 03/16/2015. Viral load was 211920. Repeat CD4 count of 20 and viral load of  391000. Per admitting physician patient never followed up with ID in the outpatient setting. Patient was admitted as stool samples taken. C. difficile PCR was negative. GI pathogen panel positive for Enteroaggregative Escherichia coli.  patient was initially placed on IV ciprofloxacin and Flagyl. I still results came back IV Flagyl was discontinued. Patient was maintained on IV ciprofloxacin and subsequently transitioned to oral ciprofloxacin. Patient improved clinically. Patient's loose stools improved. Patient was subsequently transitioned to oral ciprofloxacin and we discharged home for 1 more day of oral ciprofloxacin to complete a five-day course of antibiotic treatment. Patient was seen by infectious diseases throughout the hospitalization and followed.   #2 hypokalemia/hypomagnesemia Secondary to GI losses. Repleted.  #3 dehydration Patient with complaints of dizziness during the hospitalization. Patient was noted to have borderline blood pressure. Patient was hydrated with IV fluids with resolution of dizziness.  #4 acidosis Likely secondary to diarrhea. Resolved with bicarbonate drip.  #5 AIDS Last CD4 count was 10 on 03/16/2015 with a viral load of 211920. Repeat CD4 count 20 and viral load of 391000. Patient did not follow-up in the outpatient setting with ID. patient has been seen by infectious diseases and patient started on prophylactic Bactrim DS daily. Azithromycin on hold pending Mycobacterium studies on stool. Case manager helping with setting patient up for the match program and [facility]. ID following and appreciate input and recommendations.   #6 oral thrush/probable Candida esophagitis Patient noted on admission to have oral thrush. Admitting physician stated that patient had some complaints of odynophagia and may have esophageal candidiasis.  patient  was placed on nystatin as well as started on  Diflucan. Patient's odynophagia improved during the hospitalization. Patient be discharged home on oral Diflucan to complete a two-week course of treatment. Outpatient follow-up with ID.   #7 severe protein calorie malnutrition Patient was placed on nutritional supplements during the hospitalization.   #8 anemia Likely dilutional component and also likely chronic secondary to AIDS. Hemoglobin remained stable.    Procedures:  CT abdomen and pelvis 08/14/2015  Consultations:  Infectious diseases: Dr Orvan Falconerampbell 08/15/2015  Discharge Exam: Filed Vitals:   08/18/15 0542 08/18/15 1445  BP: 96/60 102/63  Pulse: 66 71  Temp: 98.1 F (36.7 C) 98.7 F (37.1 C)  Resp: 18 18    General: NAD Cardiovascular: RRR Respiratory: CTAB  Discharge Instructions   Discharge Instructions    Diet general    Complete by:  As directed      Discharge instructions    Complete by:  As directed   Follow up in ID clinic as scheduled.     Increase activity slowly    Complete by:  As directed           Current Discharge Medication List    START taking these medications   Details  ciprofloxacin (CIPRO) 500 MG tablet Take 1 tablet (500 mg total) by mouth 2 (two) times daily. Take for 1 day then stop. Qty: 2 tablet, Refills: 0    fluconazole (DIFLUCAN) 100 MG tablet Take 1 tablet (100 mg total) by mouth daily. Take for 11 days then stop.    HYDROcodone-homatropine (HYCODAN) 5-1.5 MG/5ML syrup Take 5 mLs by mouth every 6 (six) hours as needed for cough. Qty: 120 mL, Refills: 0    nystatin (MYCOSTATIN) 100000 UNIT/ML suspension Take 5 mLs (500,000 Units total) by mouth 4 (four) times daily -  before meals and at bedtime. Qty: 240 mL, Refills: 0    pantoprazole (PROTONIX) 40 MG tablet Take 1 tablet (40 mg total) by mouth 2 (two) times daily. Qty: 60 tablet, Refills: 3    sulfamethoxazole-trimethoprim (BACTRIM DS,SEPTRA DS) 800-160 MG tablet Take 1 tablet by mouth daily.    traMADol (ULTRAM) 50  MG tablet Take 1 tablet (50 mg total) by mouth every 6 (six) hours as needed for moderate pain. Qty: 20 tablet, Refills: 0      CONTINUE these medications which have NOT CHANGED   Details  acetaminophen (TYLENOL) 325 MG tablet Take 325 mg by mouth every 6 (six) hours as needed for mild pain or fever.    bismuth subsalicylate (PEPTO BISMOL) 262 MG/15ML suspension Take 30 mLs by mouth every 6 (six) hours as needed for indigestion or diarrhea or loose stools.    ondansetron (ZOFRAN) 4 MG tablet Take 1 tablet (4 mg total) by mouth every 8 (eight) hours as needed for nausea or vomiting. Qty: 12 tablet, Refills: 0      STOP taking these medications     elvitegravir-cobicistat-emtricitabine-tenofovir (GENVOYA) 150-150-200-10 MG TABS tablet        Allergies  Allergen Reactions  . Penicillins Hives and Itching    Has patient had a PCN reaction causing immediate rash, facial/tongue/throat swelling, SOB or lightheadedness with hypotension: Yes Has patient had a PCN reaction causing severe rash involving mucus membranes or skin necrosis: No Has patient had a PCN reaction that required hospitalization No Has patient had a PCN reaction occurring within the last 10 years: No If all of the above answers are "NO", then may proceed with Cephalosporin use.  Follow-up Information    Follow up with [facility].   Why:  August 24, 2015 at 9 am follow up appointment    Contact information:   [address] [place] [address] [phone]      Follow up with Johny Sax, MD On 08/24/2015.   Specialty:  Infectious Diseases   Why:  f/u at 9am   Contact information:   [address] [place] [address] [phone]        The results of significant diagnostics from this hospitalization (including imaging, microbiology, ancillary and laboratory) are listed below for reference.    Significant Diagnostic Studies: Ct Abdomen  Pelvis W Contrast  08/14/2015  CLINICAL DATA:  Abdominal pain with nausea EXAM: CT ABDOMEN AND PELVIS WITH CONTRAST TECHNIQUE: Multidetector CT imaging of the abdomen and pelvis was performed using the standard protocol following bolus administration of intravenous contrast. CONTRAST:  OMNIPAQUE IOHEXOL 300 MG/ML  SOLN COMPARISON:  CT abdomen pelvis 06/23/2015 FINDINGS: Lower chest: Negative for infiltrate or effusion. Bronchial wall thickening similar to the prior study and likely chronic. Hepatobiliary: Prior cholecystectomy. Intrahepatic and extrahepatic biliary dilatation improved from the prior study. Common bile duct now measures 8 mm in diameter. Question passed ductal stone. No pancreatic mass. No focal liver lesion Pancreas: Negative Spleen: Negative Adrenals/Urinary Tract: Symmetric excretion of contrast by both kidneys. No renal mass or obstruction. No renal calculi. Normal bladder. Stomach/Bowel: Fluid-filled large and small bowel loops. No dilated or thickened bowel loops. Terminal ileum normal. Appendix not definitely visualized. No evidence of acute appendicitis. Vascular/Lymphatic: Normal aorta.  No aneurysm.  No lymphadenopathy. Reproductive: Negative prostate Other: No free-fluid Musculoskeletal: Negative Image quality degraded by motion IMPRESSION: Fluid filled large and small bowel. No bowel dilatation or thickening. Question gastroenteritis or ileus. Biliary duct dilatation improved from the prior study. Question passed ductal stone. Bronchial wall thickening in the bases suggesting chronic bronchitis. Electronically Signed   By: Marlan Palau M.D.   On: 08/14/2015 21:13    Microbiology: Recent Results (from the past 240 hour(s))  C difficile quick scan w PCR reflex     Status: None   Collection Time: 08/15/15  9:40 AM  Result Value Ref Range Status   C Diff antigen NEGATIVE NEGATIVE Final   C Diff toxin NEGATIVE NEGATIVE Final   C Diff interpretation Negative for toxigenic C.  difficile  Final  Gastrointestinal Panel by PCR , Stool     Status: Abnormal   Collection Time: 08/15/15  9:40 AM  Result Value Ref Range Status   Campylobacter species NOT DETECTED NOT DETECTED Final   Plesimonas shigelloides NOT DETECTED NOT DETECTED Final   Salmonella species NOT DETECTED NOT DETECTED Final   Yersinia enterocolitica NOT DETECTED NOT DETECTED Final   Vibrio species NOT DETECTED NOT DETECTED Final   Vibrio cholerae NOT DETECTED NOT DETECTED Final   Enteroaggregative E coli (EAEC) (A) NOT DETECTED Final    CRITICAL RESULT CALLED TO, READ BACK BY AND VERIFIED WITH:    CommentPurcell Nails AT 1828 08/15/15 SDR   Enteropathogenic E coli (EPEC) NOT DETECTED NOT DETECTED Final   Enterotoxigenic E coli (ETEC) NOT DETECTED NOT DETECTED Final   Shiga like toxin producing E coli (STEC) NOT DETECTED NOT DETECTED Final   E. coli O157 NOT DETECTED NOT DETECTED Final   Shigella/Enteroinvasive E coli (EIEC) NOT DETECTED NOT DETECTED Final   Cryptosporidium NOT DETECTED NOT DETECTED Final   Cyclospora  cayetanensis NOT DETECTED NOT DETECTED Final   Entamoeba histolytica NOT DETECTED NOT DETECTED Final   Giardia lamblia NOT DETECTED NOT DETECTED Final   Adenovirus F40/41 NOT DETECTED NOT DETECTED Final   Astrovirus NOT DETECTED NOT DETECTED Final   Norovirus GI/GII NOT DETECTED NOT DETECTED Final   Rotavirus A NOT DETECTED NOT DETECTED Final   Sapovirus (I, II, IV, and V) NOT DETECTED NOT DETECTED Final  Acid Fast Smear (AFB)     Status: None   Collection Time: 08/15/15  8:24 PM  Result Value Ref Range Status   AFB Specimen Processing Concentration  Final   Acid Fast Smear Negative  Final    Comment: (NOTE) Performed At: [facility] [address] [place], [place] [id number] Mila Homer MD WU:[id number]    Source (AFB) STOOL  Final     Labs: Basic Metabolic Panel:  Recent Labs Lab 08/15/15 0335 08/16/15 0727 08/16/15 1205 08/17/15 0445 08/18/15 0512  NA  136 136 136 138 140  K 3.0* 3.2* 3.0* 3.8 3.7  CL 108 108 107 102 102  CO2 15* 18* 23 27 31   GLUCOSE 90 93 106* 89 94  BUN 21* 5* <5* <5* <5*  CREATININE 0.87 0.77 0.75 0.73 0.71  CALCIUM 8.5* 8.0* 7.7* 7.7* 7.8*  MG 1.7 1.6*  --  1.8 1.6*  PHOS 4.1  --   --   --   --    Liver Function Tests:  Recent Labs Lab 08/14/15 1102 08/15/15 0335 08/16/15 0727  AST 37 27 82*  ALT 42 34 53  ALKPHOS 122 97 77  BILITOT 1.4* 1.0 0.9  PROT 7.4 5.8* 4.7*  ALBUMIN 4.0 3.2* 2.6*    Recent Labs Lab 08/14/15 1102  LIPASE 24   No results for input(s): AMMONIA in the last 168 hours. CBC:  Recent Labs Lab 08/14/15 1102 08/15/15 0335 08/16/15 0727 08/17/15 0445  WBC 9.9 7.4 4.4 3.3*  NEUTROABS  --  3.4 3.4 1.9  HGB 16.0 13.4 10.8* 10.3*  HCT 45.7 39.1 31.6* 31.2*  MCV 71.1* 70.1* 70.9* 70.6*  PLT 328 230 168 173   Cardiac Enzymes: No results for input(s): CKTOTAL, CKMB, CKMBINDEX, TROPONINI in the last 168 hours. BNP: BNP (last 3 results) No results for input(s): BNP in the last 8760 hours.  ProBNP (last 3 results) No results for input(s): PROBNP in the last 8760 hours.  CBG: No results for input(s): GLUCAP in the last 168 hours.     SignedRamiro Harvest MD.  [facility] 08/18/2015, 4:06 PM

## 2015-08-21 LAB — CRYPTOSPORIDIUM/ISOSPORA SMEAR
CRYPTOSPORIDIUM SMEAR,STOOL: NONE SEEN
Isospora Smear, Stool: NONE SEEN

## 2015-08-24 ENCOUNTER — Ambulatory Visit (INDEPENDENT_AMBULATORY_CARE_PROVIDER_SITE_OTHER): Payer: Self-pay | Admitting: Infectious Diseases

## 2015-08-24 ENCOUNTER — Inpatient Hospital Stay: Payer: Self-pay

## 2015-08-24 ENCOUNTER — Encounter: Payer: Self-pay | Admitting: Infectious Diseases

## 2015-08-24 VITALS — BP 107/66 | HR 83 | Temp 98.3°F | Ht 68.0 in | Wt 137.0 lb

## 2015-08-24 DIAGNOSIS — K219 Gastro-esophageal reflux disease without esophagitis: Secondary | ICD-10-CM

## 2015-08-24 DIAGNOSIS — A09 Infectious gastroenteritis and colitis, unspecified: Secondary | ICD-10-CM

## 2015-08-24 DIAGNOSIS — Z23 Encounter for immunization: Secondary | ICD-10-CM

## 2015-08-24 DIAGNOSIS — B370 Candidal stomatitis: Secondary | ICD-10-CM

## 2015-08-24 DIAGNOSIS — B20 Human immunodeficiency virus [HIV] disease: Secondary | ICD-10-CM

## 2015-08-24 MED ORDER — AZITHROMYCIN 600 MG PO TABS: 1200.0000 mg | ORAL_TABLET | ORAL | Status: DC

## 2015-08-24 MED ORDER — ELVITEG-COBIC-EMTRICIT-TENOFAF 150-150-200-10 MG PO TABS: 1.0000 | ORAL_TABLET | Freq: Every day | ORAL | Status: DC

## 2015-08-24 NOTE — Progress Notes (Signed)
   Subjective:    Patient ID: Jon Love, male    DOB: [birth year], 40 y.o.   MRN: [id number]  HPI 40 yo [name] 10 [place], [facility] abd [facility]. He underwent lap-chole and was also found to be HIV+.  He was started on genvoya which he has not been able to get.  He was again in hospital Oct 2016 with pneumonia and presumed PCP.  He returned earlier this month with entero-aggerative E coli diarrhea, n/v. He improved on Cipro.  No further diarrhea, He continues to have abd pain with eating.   Meds reviewed.  He is not on ART.    HIV 1 RNA QUANT (copies/mL)  Date Value  08/15/2015 391000  03/16/2015 211920   CD4 T CELL ABS (/uL)  Date Value  08/15/2015 20*  03/16/2015 10*  10/08/2014 10*     Review of Systems  Constitutional: Negative for fever and appetite change.  Gastrointestinal: Negative for diarrhea.  Genitourinary: Negative for difficulty urinating.  some oral burning with eating.      Objective:   Physical Exam  Constitutional: He appears well-developed and well-nourished.  HENT:  Mouth/Throat: No oropharyngeal exudate.  Eyes: EOM are normal. Pupils are equal, round, and reactive to light.  Neck: Neck supple.  Cardiovascular: Normal rate, regular rhythm and normal heart sounds.   Pulmonary/Chest: Effort normal and breath sounds normal.  Abdominal: Soft. Bowel sounds are normal. There is no tenderness. There is no rebound.  Musculoskeletal: He exhibits no edema.  Lymphadenopathy:    He has no cervical adenopathy.      Assessment & Plan:

## 2015-08-24 NOTE — Addendum Note (Signed)
Addended by: Wendall MolaOCKERHAM, JACQUELINE A on: 08/24/2015 11:12 AM   Modules accepted: Orders

## 2015-08-24 NOTE — Assessment & Plan Note (Signed)
resolved 

## 2015-08-24 NOTE — Assessment & Plan Note (Signed)
Spoke withj Mitch- he has adap.  Will print new rx.  Will give 3rd hep b Given condoms.  Continue bactrim Will start azithro for 6 weeks.  Will see him back in 1 month

## 2015-08-24 NOTE — Addendum Note (Signed)
Addended by: Jennet MaduroESTRIDGE, DENISE D on: 08/24/2015 11:06 AM   Modules accepted: Orders

## 2015-08-24 NOTE — Assessment & Plan Note (Signed)
Asked him to sleep with head of bed elevated.  Continue protonix

## 2015-08-24 NOTE — Assessment & Plan Note (Signed)
Appears to be resolved. Asked him to complete diflucan and nystatin in 1 week.

## 2015-08-27 ENCOUNTER — Other Ambulatory Visit: Payer: Self-pay | Admitting: *Deleted

## 2015-08-27 DIAGNOSIS — B20 Human immunodeficiency virus [HIV] disease: Secondary | ICD-10-CM

## 2015-08-27 MED ORDER — PANTOPRAZOLE SODIUM 40 MG PO TBEC: 40.0000 mg | DELAYED_RELEASE_TABLET | Freq: Two times a day (BID) | ORAL | Status: DC

## 2015-08-27 MED ORDER — AZITHROMYCIN 600 MG PO TABS: 1200.0000 mg | ORAL_TABLET | ORAL | Status: DC

## 2015-08-27 MED ORDER — ELVITEG-COBIC-EMTRICIT-TENOFAF 150-150-200-10 MG PO TABS: 1.0000 | ORAL_TABLET | Freq: Every day | ORAL | Status: DC

## 2015-08-27 MED ORDER — SULFAMETHOXAZOLE-TRIMETHOPRIM 800-160 MG PO TABS: 1.0000 | ORAL_TABLET | Freq: Every day | ORAL | Status: DC

## 2015-08-27 MED ORDER — ELVITEG-COBIC-EMTRICIT-TENOFAF 150-150-200-10 MG PO TABS
1.0000 | ORAL_TABLET | Freq: Every day | ORAL | Status: DC
Start: 2015-08-27 — End: 2016-03-21

## 2015-08-27 NOTE — Addendum Note (Signed)
Addended by: Jennet MaduroESTRIDGE, DENISE D on: 08/27/2015 10:31 AM   Modules accepted: Orders

## 2015-09-05 ENCOUNTER — Other Ambulatory Visit: Payer: Self-pay | Admitting: Infectious Diseases

## 2015-09-05 DIAGNOSIS — K219 Gastro-esophageal reflux disease without esophagitis: Secondary | ICD-10-CM

## 2015-09-05 MED ORDER — OMEPRAZOLE 40 MG PO CPDR: 40.0000 mg | DELAYED_RELEASE_CAPSULE | Freq: Every day | ORAL | Status: DC

## 2015-09-10 ENCOUNTER — Ambulatory Visit: Payer: Self-pay | Admitting: Infectious Diseases

## 2015-09-10 ENCOUNTER — Other Ambulatory Visit: Payer: Self-pay | Admitting: Infectious Diseases

## 2015-09-10 DIAGNOSIS — K219 Gastro-esophageal reflux disease without esophagitis: Secondary | ICD-10-CM

## 2015-09-10 MED ORDER — ESOMEPRAZOLE MAGNESIUM 40 MG PO CPDR: 40.0000 mg | DELAYED_RELEASE_CAPSULE | Freq: Every day | ORAL | Status: DC

## 2015-09-12 ENCOUNTER — Other Ambulatory Visit: Payer: Self-pay | Admitting: *Deleted

## 2015-09-12 DIAGNOSIS — K219 Gastro-esophageal reflux disease without esophagitis: Secondary | ICD-10-CM

## 2015-09-12 MED ORDER — OMEPRAZOLE 40 MG PO CPDR: 40.0000 mg | DELAYED_RELEASE_CAPSULE | Freq: Every day | ORAL | Status: DC

## 2015-09-12 NOTE — Telephone Encounter (Signed)
Error already done

## 2015-09-28 LAB — ACID FAST CULTURE WITH REFLEXED SENSITIVITIES: ACID FAST CULTURE - AFSCU3: NEGATIVE

## 2015-10-25 ENCOUNTER — Ambulatory Visit: Payer: Self-pay | Admitting: Infectious Diseases

## 2015-11-02 ENCOUNTER — Encounter: Payer: Self-pay | Admitting: Infectious Diseases

## 2015-11-02 ENCOUNTER — Ambulatory Visit (INDEPENDENT_AMBULATORY_CARE_PROVIDER_SITE_OTHER): Payer: Self-pay | Admitting: Infectious Diseases

## 2015-11-02 VITALS — BP 107/75 | HR 89 | Temp 97.9°F | Ht 69.0 in | Wt 142.0 lb

## 2015-11-02 DIAGNOSIS — B370 Candidal stomatitis: Secondary | ICD-10-CM

## 2015-11-02 DIAGNOSIS — B20 Human immunodeficiency virus [HIV] disease: Secondary | ICD-10-CM

## 2015-11-02 LAB — T-HELPER CELL (CD4) - (RCID CLINIC ONLY): CD4 T CELL ABS: 10 /uL — AB (ref 400–2700)

## 2015-11-02 NOTE — Progress Notes (Signed)
   Subjective:    Patient ID: Jon Love, male    DOB: [birth year], 40 y.o.   MRN: [id number]  HPI 40 yo [place] M in [place] for 10 yrs, was adm to [facility] on 4-29 with abd pain. He underwent lap-chole and was also found to be HIV+.  He was started on genvoya which he was not been able to get.  He was again in hospital Oct 2016 with pneumonia and presumed PCP.  He was seen in ID 08-2015 and also met with mitch.  IT is not clear if he has been adherent to his meds. He ran out of his meds 5 days prior.  He is on azithro, bactrim, genvoya. Since he ran out he has had bone pain. Knees, ankle, elbows.   HIV 1 RNA QUANT (copies/mL)  Date Value  08/15/2015 391000  03/16/2015 211920   CD4 T CELL ABS (/uL)  Date Value  08/15/2015 20*  03/16/2015 10*  10/08/2014 10*     Review of Systems  Constitutional: Negative for appetite change and unexpected weight change.  Gastrointestinal: Negative for diarrhea and constipation.  Genitourinary: Negative for difficulty urinating.  3x/night urination.      Objective:   Physical Exam  Constitutional: He appears well-developed and well-nourished.  HENT:  Mouth/Throat: No oropharyngeal exudate.  Eyes: EOM are normal. Pupils are equal, round, and reactive to light.  Neck: Neck supple.  Cardiovascular: Normal rate, regular rhythm and normal heart sounds.   Pulmonary/Chest: Effort normal and breath sounds normal.  Abdominal: Soft. Bowel sounds are normal. There is no tenderness. There is no rebound.  Lymphadenopathy:    He has no cervical adenopathy.       Assessment & Plan:

## 2015-11-02 NOTE — Assessment & Plan Note (Signed)
Resolved

## 2015-11-02 NOTE — Assessment & Plan Note (Signed)
Not clear that he is taking his meds correctly. Will check his labs today for resistance and to see if we can stop OI rx.  He is given condoms rtc in 4-5 months Will get him in with dental.

## 2015-11-06 LAB — HIV-1 RNA ULTRAQUANT REFLEX TO GENTYP+
HIV 1 RNA QUANT: 1247133 {copies}/mL — AB (ref <20)
HIV-1 RNA QUANT, LOG: 6.1 {Log_copies}/mL — AB (ref <1.30)

## 2015-11-14 LAB — HIV-1 GENOTYPR PLUS

## 2016-02-20 ENCOUNTER — Other Ambulatory Visit: Payer: Self-pay

## 2016-03-05 ENCOUNTER — Ambulatory Visit: Payer: Self-pay | Admitting: Infectious Diseases

## 2016-03-21 ENCOUNTER — Ambulatory Visit (INDEPENDENT_AMBULATORY_CARE_PROVIDER_SITE_OTHER): Payer: Self-pay | Admitting: Infectious Diseases

## 2016-03-21 ENCOUNTER — Ambulatory Visit: Payer: Self-pay

## 2016-03-21 ENCOUNTER — Other Ambulatory Visit: Payer: Self-pay | Admitting: *Deleted

## 2016-03-21 ENCOUNTER — Encounter: Payer: Self-pay | Admitting: Infectious Diseases

## 2016-03-21 ENCOUNTER — Ambulatory Visit: Payer: Self-pay | Admitting: Infectious Diseases

## 2016-03-21 VITALS — BP 130/88 | HR 79 | Temp 98.1°F | Ht 68.0 in | Wt 139.0 lb

## 2016-03-21 DIAGNOSIS — B370 Candidal stomatitis: Secondary | ICD-10-CM | POA: Insufficient documentation

## 2016-03-21 DIAGNOSIS — Z23 Encounter for immunization: Secondary | ICD-10-CM

## 2016-03-21 DIAGNOSIS — B20 Human immunodeficiency virus [HIV] disease: Secondary | ICD-10-CM

## 2016-03-21 DIAGNOSIS — B3781 Candidal esophagitis: Secondary | ICD-10-CM

## 2016-03-21 MED ORDER — AZITHROMYCIN 600 MG PO TABS: 1200.0000 mg | ORAL_TABLET | ORAL | 0 refills | Status: DC

## 2016-03-21 MED ORDER — SULFAMETHOXAZOLE-TRIMETHOPRIM 800-160 MG PO TABS: 1.0000 | ORAL_TABLET | Freq: Every day | ORAL | 6 refills | Status: DC

## 2016-03-21 MED ORDER — AZITHROMYCIN 600 MG PO TABS
1200.0000 mg | ORAL_TABLET | ORAL | 5 refills | Status: DC
Start: 2016-03-21 — End: 2016-03-21

## 2016-03-21 MED ORDER — ELVITEG-COBIC-EMTRICIT-TENOFAF 150-150-200-10 MG PO TABS: 1.0000 | ORAL_TABLET | Freq: Every day | ORAL | 5 refills | Status: DC

## 2016-03-21 MED ORDER — SULFAMETHOXAZOLE-TRIMETHOPRIM 800-160 MG PO TABS: 1.0000 | ORAL_TABLET | Freq: Every day | ORAL | 0 refills | Status: DC

## 2016-03-21 MED ORDER — AZITHROMYCIN 600 MG PO TABS: 1200.0000 mg | ORAL_TABLET | ORAL | 5 refills | Status: DC

## 2016-03-21 MED ORDER — CLOTRIMAZOLE 10 MG MT TROC: 10.0000 mg | Freq: Every day | OROMUCOSAL | 1 refills | Status: DC

## 2016-03-21 MED ORDER — ELVITEG-COBIC-EMTRICIT-TENOFAF 150-150-200-10 MG PO TABS: 1.0000 | ORAL_TABLET | Freq: Every day | ORAL | 3 refills | Status: DC

## 2016-03-21 NOTE — Progress Notes (Signed)
   Subjective:    Patient ID: Jon Love, male    DOB: 1976/01/21, 40 y.o.   MRN: [id number]  HPI 40 yo [place] M in [place] for 10 yrs, was adm to [facility] on 4-29 with abd pain. He underwent lap-chole and was also found to be HIV+.  He was started on genvoya which he was not been able to get.  He was again in hospital Oct 2016 with pneumonia and presumed PCP.  He was seen in ID 08-2015 and also met with mitch.  He returns today and has been off his ART for 2 weeks. He has had problems with transportation and his phone which delayed him getting his medicine refilled.  Has had occas cough, no problems eating.  No problems with urine or BM.    HIV 1 RNA Quant (copies/mL)  Date Value  11/02/2015 1,247,133 (H)  08/15/2015 391,000  03/16/2015 211,920   CD4 T Cell Abs (/uL)  Date Value  11/02/2015 10 (L)  08/15/2015 20 (L)  03/16/2015 10 (L)    Review of Systems See HPI.     Objective:   Physical Exam  Constitutional: He appears well-developed and well-nourished.  HENT:  Mouth/Throat: Oropharyngeal exudate present.  Very mild thrush  Eyes: EOM are normal. Pupils are equal, round, and reactive to light.  Neck: Neck supple.  Cardiovascular: Normal rate, regular rhythm and normal heart sounds.   Pulmonary/Chest: Effort normal and breath sounds normal.  Abdominal: Soft. Bowel sounds are normal. There is no tenderness. There is no rebound.  Musculoskeletal: He exhibits no edema.  Lymphadenopathy:    He has no cervical adenopathy.       Assessment & Plan:

## 2016-03-21 NOTE — Assessment & Plan Note (Signed)
We will restart his meds.  He resumed his ADAP today.  He is given condoms  Gets flu shot today.  rtc in 6 weeks.

## 2016-03-21 NOTE — Assessment & Plan Note (Signed)
Will give him mycelex for 1 week.  Reiterated importance of taking his ART.

## 2016-03-24 ENCOUNTER — Other Ambulatory Visit: Payer: Self-pay | Admitting: *Deleted

## 2016-03-24 DIAGNOSIS — B20 Human immunodeficiency virus [HIV] disease: Secondary | ICD-10-CM

## 2016-03-24 MED ORDER — ELVITEG-COBIC-EMTRICIT-TENOFAF 150-150-200-10 MG PO TABS
1.0000 | ORAL_TABLET | Freq: Every day | ORAL | 5 refills | Status: DC
Start: 2016-03-24 — End: 2016-08-29

## 2016-03-25 ENCOUNTER — Encounter: Payer: Self-pay | Admitting: Infectious Diseases

## 2016-05-09 ENCOUNTER — Ambulatory Visit (INDEPENDENT_AMBULATORY_CARE_PROVIDER_SITE_OTHER): Payer: Self-pay | Admitting: Infectious Diseases

## 2016-05-09 ENCOUNTER — Ambulatory Visit: Payer: Self-pay | Admitting: Infectious Diseases

## 2016-05-09 ENCOUNTER — Encounter: Payer: Self-pay | Admitting: Infectious Diseases

## 2016-05-09 VITALS — BP 131/81 | HR 80 | Temp 97.6°F | Wt 150.0 lb

## 2016-05-09 DIAGNOSIS — B3781 Candidal esophagitis: Secondary | ICD-10-CM

## 2016-05-09 DIAGNOSIS — B370 Candidal stomatitis: Secondary | ICD-10-CM

## 2016-05-09 DIAGNOSIS — B20 Human immunodeficiency virus [HIV] disease: Secondary | ICD-10-CM

## 2016-05-09 NOTE — Progress Notes (Signed)
   Subjective:    Patient ID: Jon Love, male    DOB: [birth year], 40 y.o.   MRN: [id number]  HPI 40 yo [place] 10 [place], [facility] with abd pain. He underwent lap-chole and was also found to be HIV+.  He was started on genvoya which he was not been able to get.  He was again in hospital Oct 2016 with pneumonia and presumed PCP.   He has had issues with adherence. [facility].  He works in both [employer].   States he has been taking his medicines well. Denies missed rx.  Not sure if he has had flu shot. (got 10-17).  Has been feeling well.   HIV 1 RNA Quant (copies/mL)  Date Value  11/02/2015 1,247,133 (H)  08/15/2015 391,000  03/16/2015 211,920   CD4 T Cell Abs (/uL)  Date Value  11/02/2015 10 (L)  08/15/2015 20 (L)  03/16/2015 10 (L)   Does not smoke.   Review of Systems  Constitutional: Negative for appetite change, fatigue and unexpected weight change.  Respiratory: Positive for cough. Negative for shortness of breath.   Gastrointestinal: Negative for diarrhea and nausea.  Genitourinary: Negative for difficulty urinating.       Objective:   Physical Exam  Constitutional: He appears well-developed and well-nourished.  HENT:  Mouth/Throat: No oropharyngeal exudate.  Eyes: EOM are normal. Pupils are equal, round, and reactive to light.  Neck: Neck supple.  Cardiovascular: Normal rate, regular rhythm and normal heart sounds.   Pulmonary/Chest: Effort normal and breath sounds normal.  Abdominal: Soft. Bowel sounds are normal. There is no tenderness. There is no rebound.  Musculoskeletal: He exhibits no edema.  Lymphadenopathy:    He has no cervical adenopathy.      Assessment & Plan:

## 2016-05-09 NOTE — Assessment & Plan Note (Signed)
He appears to be doing well Try to get him into dental Given condoms Will send him to lab today.  My great appreciation to Marthann SchillerMitch and Kelby FamManuel rtc in 3-4 months.

## 2016-05-09 NOTE — Assessment & Plan Note (Signed)
Very mild today.  Will defer rx for now.

## 2016-08-29 ENCOUNTER — Other Ambulatory Visit: Payer: Self-pay | Admitting: *Deleted

## 2016-08-29 DIAGNOSIS — B20 Human immunodeficiency virus [HIV] disease: Secondary | ICD-10-CM

## 2016-08-29 MED ORDER — SULFAMETHOXAZOLE-TRIMETHOPRIM 800-160 MG PO TABS: 1.0000 | ORAL_TABLET | Freq: Every day | ORAL | 0 refills | Status: DC

## 2016-08-29 MED ORDER — AZITHROMYCIN 600 MG PO TABS: 1200.0000 mg | ORAL_TABLET | ORAL | 0 refills | Status: DC

## 2016-08-29 MED ORDER — ELVITEG-COBIC-EMTRICIT-TENOFAF 150-150-200-10 MG PO TABS: 1.0000 | ORAL_TABLET | Freq: Every day | ORAL | 0 refills | Status: DC

## 2016-09-04 ENCOUNTER — Ambulatory Visit: Payer: Self-pay

## 2016-09-11 ENCOUNTER — Encounter: Payer: Self-pay | Admitting: Infectious Diseases

## 2016-09-27 ENCOUNTER — Other Ambulatory Visit: Payer: Self-pay | Admitting: Infectious Diseases

## 2016-09-27 DIAGNOSIS — K219 Gastro-esophageal reflux disease without esophagitis: Secondary | ICD-10-CM

## 2017-06-23 ENCOUNTER — Emergency Department (HOSPITAL_COMMUNITY): Payer: Self-pay

## 2017-06-23 ENCOUNTER — Other Ambulatory Visit: Payer: Self-pay

## 2017-06-23 ENCOUNTER — Emergency Department (HOSPITAL_COMMUNITY)
Admission: EM | Admit: 2017-06-23 | Discharge: 2017-06-23 | Disposition: A | Discharge status: Home / Self Care | Payer: Self-pay | Attending: Emergency Medicine | Admitting: Emergency Medicine

## 2017-06-23 DIAGNOSIS — Z79899 Other long term (current) drug therapy: Secondary | ICD-10-CM | POA: Insufficient documentation

## 2017-06-23 DIAGNOSIS — R1909 Other intra-abdominal and pelvic swelling, mass and lump: Secondary | ICD-10-CM | POA: Insufficient documentation

## 2017-06-23 DIAGNOSIS — B20 Human immunodeficiency virus [HIV] disease: Secondary | ICD-10-CM | POA: Insufficient documentation

## 2017-06-23 LAB — CBC
HEMATOCRIT: 36.9 % — AB (ref 39.0–52.0)
Hemoglobin: 12 g/dL — ABNORMAL LOW (ref 13.0–17.0)
MCH: 24.3 pg — ABNORMAL LOW (ref 26.0–34.0)
MCHC: 32.5 g/dL (ref 30.0–36.0)
MCV: 74.8 fL — AB (ref 78.0–100.0)
PLATELETS: 195 10*3/uL (ref 150–400)
RBC: 4.93 MIL/uL (ref 4.22–5.81)
RDW: 13.2 % (ref 11.5–15.5)
WBC: 4.1 10*3/uL (ref 4.0–10.5)

## 2017-06-23 LAB — URINALYSIS, ROUTINE W REFLEX MICROSCOPIC
BILIRUBIN URINE: NEGATIVE
Glucose, UA: NEGATIVE mg/dL
HGB URINE DIPSTICK: NEGATIVE
KETONES UR: NEGATIVE mg/dL
LEUKOCYTES UA: NEGATIVE
Nitrite: NEGATIVE
PROTEIN: NEGATIVE mg/dL
Specific Gravity, Urine: 1.021 (ref 1.005–1.030)
pH: 5 (ref 5.0–8.0)

## 2017-06-23 LAB — COMPREHENSIVE METABOLIC PANEL
ALBUMIN: 3.2 g/dL — AB (ref 3.5–5.0)
ALT: 22 U/L (ref 17–63)
AST: 23 U/L (ref 15–41)
Alkaline Phosphatase: 77 U/L (ref 38–126)
Anion gap: 8 (ref 5–15)
BILIRUBIN TOTAL: 0.7 mg/dL (ref 0.3–1.2)
BUN: 8 mg/dL (ref 6–20)
CHLORIDE: 104 mmol/L (ref 101–111)
CO2: 24 mmol/L (ref 22–32)
CREATININE: 0.74 mg/dL (ref 0.61–1.24)
Calcium: 8.4 mg/dL — ABNORMAL LOW (ref 8.9–10.3)
GFR calc Af Amer: >60 mL/min (ref >60)
GLUCOSE: 89 mg/dL (ref 65–99)
Potassium: 3.6 mmol/L (ref 3.5–5.1)
Sodium: 136 mmol/L (ref 135–145)
TOTAL PROTEIN: 5.9 g/dL — AB (ref 6.5–8.1)

## 2017-06-23 LAB — LIPASE, BLOOD: Lipase: 30 U/L (ref 11–51)

## 2017-06-23 MED ORDER — TRAMADOL HCL 50 MG PO TABS: 50.0000 mg | ORAL_TABLET | Freq: Four times a day (QID) | ORAL | 0 refills | Status: DC | PRN

## 2017-06-23 NOTE — ED Provider Notes (Signed)
Medical screening examination/treatment/procedure(s) were conducted as a shared visit with non-physician practitioner(s) and myself.  I personally evaluated the patient during the encounter. Briefly, the patient is a 42 y.o. male with a history of HIV on antiretroviral medication, which he has not taken since October due to inability to get refills from the clinic who presents today with 3-4 months of right inguinal swelling.  On exam patient has approximately 6-7 cm by a 3 cm lump in the right inguinal region that is nonmobile, firm and associated with mild discomfort on palpation.  Most suspicious for lymphadenopathy.  Patient also has small amount of lymphadenopathy in the left inguinal region.  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  Labs are grossly reassuring.  Presentation is not suspicious for hernia.  Instructed patient to follow-up closely with infectious disease.  Will discuss case with case management to provide patient assistance with close follow-up.  The patient is safe for discharge with strict return precautions.    EKG Interpretation None           Cardama, Amadeo GarnetPedro Eduardo, MD 06/23/17 1739

## 2017-06-23 NOTE — Discharge Instructions (Signed)
Please make an appointment with infectious disease to restart medicines Take Tramadol for pain as needed

## 2017-06-23 NOTE — Care Management Note (Signed)
Case Management Note  Patient Details  Name: Lucinda Dellntonio Hernandez MRN: [id number] Date of Birth: Mar 16, 1976  Subjective/Objective:                  hernia  Action/Plan: CM spoke with the patient with the assistance of a Spanish Interpreter via the [employer]. CM provided the patient  with a print out including the address and telephone number for Infectious Disease, Dr. Ninetta LightsHatcher who he reports he has seen in the past. Patient verbalizes understanding to call Dr. Moshe CiproHatcher's office tomorrow morning to request an appointment and to inform the office staff he was seen in the ED this evening. CM stressed the importance of him calling tomorrow to request an appointment. Patient agrees. CM confirmed his current telephone number is [phone].   Expected Discharge Date:   06/23/17               Expected Discharge Plan:  Home/Self Care  In-House Referral:     Discharge planning Services  Indigent Health Clinic  Post Acute Care Choice:  NA Choice offered to:  NA  DME Arranged:  N/A DME Agency:  NA  HH Arranged:  NA HH Agency:  NA  Status of Service:  Completed, signed off  If discussed at Long Length of Stay Meetings, dates discussed:    Additional Comments:  Antony HasteBennett, Aliza Moret Harris, RN 06/23/2017, 7:39 PM

## 2017-06-23 NOTE — ED Notes (Signed)
Patient transported to X-ray 

## 2017-06-23 NOTE — ED Notes (Signed)
Patient is getting into a gown patient is resting with call bell in reach 

## 2017-06-23 NOTE — Progress Notes (Signed)
CSW spoke with pt via the language interpreter line. Pt is in need of food resources. CSW provided pt with food resources in [place].   Montine CircleKelsy Rudell Ortman, Silverio LayLCSWA [facility]  [phone]

## 2017-06-23 NOTE — ED Provider Notes (Signed)
[facility] EMERGENCY DEPARTMENT Provider Note   CSN: [id number] Arrival date & time: 06/23/17  1031     History   Chief Complaint Chief Complaint  Patient presents with  . Inguinal Hernia    HPI Jon Love is a 42 y.o. male who presents with a groin mass. PMH significant for HIV/AIDs (currently untreated). He states that he has had a mass over the right groin for the past 5 months. It is worse with walking and straining and causes his right leg to go numb. He also has cold symptoms and a chronic cough. He has not been on ART since October. He thinks the mass may be a hernia but it never reduces. The pain is 10/10. He denies N/V but has had decreased oral intake. No urinary symptoms. He has been having diarrhea. He tried to go to the ID clinic but they referred him to the ED.   HPI  Past Medical History:  Diagnosis Date  . HIV disease [facility])     Patient Active Problem List   Diagnosis Date Noted  . Thrush of mouth and esophagus (HCC) 03/21/2016  . Anemia 08/16/2015  . Generalized abdominal pain   . Acute gastroenteritis 08/15/2015  . Dehydration 08/14/2015  . Gastroesophageal reflux disease without esophagitis   . Streptococcal pneumonia (HCC) 03/17/2015  . Gout 10/12/2014  . Cough   . AIDS (acquired immunodeficiency syndrome), CD4 <=200 (HCC) 10/08/2014  . Cholelithiasis 10/07/2014  . Protein-calorie malnutrition, severe (HCC) 10/07/2014  . 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 Choledocholithiasis   . Hypokalemia 10/06/2014  . Abnormal transaminases 10/06/2014    Past Surgical History:  Procedure Laterality Date  . CHOLECYSTECTOMY N/A 10/11/2014   Procedure: LAPAROSCOPIC CHOLECYSTECTOMY WITH INTRAOPERATIVE CHOLANGIOGRAM;  Surgeon: Abigail Miyamoto, MD;  Location: [facility] OR;  Service: General;  Laterality: N/A;  . ERCP N/A 10/09/2014   Procedure: ENDOSCOPIC RETROGRADE CHOLANGIOPANCREATOGRAPHY (ERCP);  Surgeon: Jeani Hawking, MD;  Location: [facility]  ENDOSCOPY;  Service: Endoscopy;  Laterality: N/A;       Home Medications    Prior to Admission medications   Medication Sig Start Date End Date Taking? Authorizing Provider  acetaminophen (TYLENOL) 325 MG tablet Take 325 mg by mouth every 6 (six) hours as needed for mild pain or fever.    [provider]  azithromycin (ZITHROMAX) 600 MG tablet Take 2 tablets (1,200 mg total) by mouth once a week. 08/29/16   Ginnie Smart, MD  clotrimazole (MYCELEX) 10 MG troche Take 1 tablet (10 mg total) by mouth 5 (five) times daily. 03/21/16   Ginnie Smart, MD  elvitegravir-cobicistat-emtricitabine-tenofovir (GENVOYA) 150-150-200-10 MG TABS tablet Take 1 tablet by mouth daily with breakfast. 08/29/16   Ginnie Smart, MD  sulfamethoxazole-trimethoprim (BACTRIM DS,SEPTRA DS) 800-160 MG tablet Take 1 tablet by mouth daily. 08/29/16   Ginnie Smart, MD    Family History No family history on file.  Social History Social History   Tobacco Use  . Smoking status: Never Smoker  . Smokeless tobacco: Never Used  Substance Use Topics  . Alcohol use: Yes    Alcohol/week: 0.0 oz    Comment: socially  . Drug use: No     Allergies   Penicillins   Review of Systems Review of Systems  Constitutional: Positive for appetite change. Negative for chills and fever.  HENT: Positive for congestion.   Respiratory: Positive for cough. Negative for shortness of breath and wheezing.   Cardiovascular: Negative for chest pain.  Gastrointestinal: Positive  for diarrhea. Negative for abdominal pain, constipation, nausea and vomiting.  Genitourinary: Negative for difficulty urinating, discharge, dysuria, flank pain, penile pain, penile swelling, scrotal swelling and testicular pain.       +groin swelling  Allergic/Immunologic: Positive for immunocompromised state.  Neurological: Positive for numbness. Negative for weakness.  All other systems reviewed and are negative.    Physical  Exam Updated Vital Signs BP 94/67 (BP Location: Right Arm)   Pulse 82   Temp 98.1 F (36.7 C) (Oral)   Resp 16   SpO2 97%   Physical Exam  Constitutional: He is oriented to person, place, and time. He appears well-developed and well-nourished. No distress.  Chronically ill appearing  HENT:  Head: Normocephalic and atraumatic.  Oral thrush. Poor dentition  Eyes: Conjunctivae are normal. Pupils are equal, round, and reactive to light. Right eye exhibits no discharge. Left eye exhibits no discharge. No scleral icterus.  Neck: Normal range of motion.  Cardiovascular: Normal rate and regular rhythm. Exam reveals no gallop and no friction rub.  No murmur heard. Pulmonary/Chest: Effort normal and breath sounds normal. No stridor. No respiratory distress. He has no wheezes. He has no rales. He exhibits no tenderness.  Abdominal: He exhibits no distension.  Genitourinary:  Genitourinary Comments: Soft mass over right groin which is minimally tender. It is not reducible and not consistent with a hernia. Multiple lesion on penis. Testicles are nontender with normal lie. No obvious discharge noted. Chaperone present during exam.    Neurological: He is alert and oriented to person, place, and time.  Skin: Skin is warm and dry.  Multiple skin lesions  Psychiatric: He has a normal mood and affect. His behavior is normal.  Nursing note and vitals reviewed.    ED Treatments / Results  Labs (all labs ordered are listed, but only abnormal results are displayed) Labs Reviewed  COMPREHENSIVE METABOLIC PANEL - Abnormal; Notable for the following components:      Result Value   Calcium 8.4 (*)    Total Protein 5.9 (*)    Albumin 3.2 (*)    All other components within normal limits  CBC - Abnormal; Notable for the following components:   Hemoglobin 12.0 (*)    HCT 36.9 (*)    MCV 74.8 (*)    MCH 24.3 (*)    All other components within normal limits  LIPASE, BLOOD  URINALYSIS, ROUTINE W  REFLEX MICROSCOPIC    EKG  EKG Interpretation None       Radiology Dg Chest 2 View  Result Date: 06/23/2017 CLINICAL DATA:  Cough EXAM: CHEST  2 VIEW COMPARISON:  06/23/2015 FINDINGS: Right perihilar patchy opacity is stable compatible with scarring. Lungs are otherwise clear with interstitial prominence. No pneumothorax or pleural effusion. Normal heart size. IMPRESSION: Chronic changes.  No active cardiopulmonary disease. Electronically Signed   By: Jolaine ClickArthur  Hoss M.D.   On: 06/23/2017 16:39    Procedures Procedures (including critical care time)  Medications Ordered in ED Medications - No data to display   Initial Impression / Assessment and Plan / ED Course  I have reviewed the triage vital signs and the nursing notes.  Pertinent labs & imaging results that were available during my care of the patient were reviewed by me and considered in my medical decision making (see chart for details).  42 year old male with HIV/AIDS. On exam he has a soft, minimally tender mass which is consistent with lymphadenopathy due to his untreated HIV. A hernia was  not appreciated. Labs are overall unremarkable. CXR is negative. CM was consulted to aid the patient in re-establishing care with ID since he has been lost to follow up. Shared visit with Dr. Eudelia Bunch. Will rx Tramadol and advised patient to follow up with ID. He verbalized understanding.  Final Clinical Impressions(s) / ED Diagnoses   Final diagnoses:  Right groin mass    ED Discharge Orders    None       Bethel Born, PA-C 06/23/17 1950

## 2017-06-23 NOTE — ED Triage Notes (Signed)
Pt states one month ago he notice a protrusion in his right groin that is painful worse with movement of lifting something. Pt states pain is 8/10 been constant for one month.

## 2017-06-23 NOTE — ED Notes (Signed)
Pt stable, ambulatory, states understanding of discharge instructions 

## 2017-06-24 ENCOUNTER — Telehealth: Payer: Self-pay | Admitting: *Deleted

## 2017-06-24 ENCOUNTER — Other Ambulatory Visit: Payer: Self-pay | Admitting: Infectious Diseases

## 2017-06-24 ENCOUNTER — Other Ambulatory Visit: Payer: Self-pay

## 2017-06-24 DIAGNOSIS — Z113 Encounter for screening for infections with a predominantly sexual mode of transmission: Secondary | ICD-10-CM

## 2017-06-24 DIAGNOSIS — B20 Human immunodeficiency virus [HIV] disease: Secondary | ICD-10-CM

## 2017-06-24 DIAGNOSIS — Z79899 Other long term (current) drug therapy: Secondary | ICD-10-CM

## 2017-06-24 NOTE — Progress Notes (Signed)
Needs labs

## 2017-06-24 NOTE — Telephone Encounter (Signed)
Referral received to offer assistance with re-engaging into care. Noted that the patient did keep his lab appt today and labs have been collected. Called the patient and had to leave a message. I would like to be sure the patient is aware of his upcoming on the 30th and would like to offer transportation assistance if it is needed. Asked Mr Jon Love to please return my call.

## 2017-06-24 NOTE — Care Management (Signed)
CM called the [facility] line at [facility] to confirm whether or not the patient has called today to schedule an appointment. CM was sent to a voicemail. Left a message requesting confirmation and return call. CM explained the patient was in the ED on 06/23/17 and was instructed to call today to schedule an appointment with Dr. Ninetta LightsHatcher. Informed the ED PA also sent a message to Dr. Ninetta LightsHatcher concerning the patient on 06/23/17. CM contact information provided.

## 2017-06-25 LAB — COMPREHENSIVE METABOLIC PANEL
AG Ratio: 1.5 (calc) (ref 1.0–2.5)
ALT: 19 U/L (ref 9–46)
AST: 20 U/L (ref 10–40)
Albumin: 3.7 g/dL (ref 3.6–5.1)
Alkaline phosphatase (APISO): 82 U/L (ref 40–115)
BUN: 8 mg/dL (ref 7–25)
CALCIUM: 8.6 mg/dL (ref 8.6–10.3)
CO2: 26 mmol/L (ref 20–32)
Chloride: 103 mmol/L (ref 98–110)
Creat: 0.74 mg/dL (ref 0.60–1.35)
GLUCOSE: 88 mg/dL (ref 65–99)
Globulin: 2.4 g/dL (calc) (ref 1.9–3.7)
Potassium: 3.8 mmol/L (ref 3.5–5.3)
SODIUM: 139 mmol/L (ref 135–146)
TOTAL PROTEIN: 6.1 g/dL (ref 6.1–8.1)
Total Bilirubin: 0.7 mg/dL (ref 0.2–1.2)

## 2017-06-25 LAB — URINE CYTOLOGY ANCILLARY ONLY
Chlamydia: NEGATIVE
NEISSERIA GONORRHEA: NEGATIVE

## 2017-06-25 LAB — CBC
HCT: 40.7 % (ref 38.5–50.0)
HEMOGLOBIN: 13.1 g/dL — AB (ref 13.2–17.1)
MCH: 24 pg — AB (ref 27.0–33.0)
MCHC: 32.2 g/dL (ref 32.0–36.0)
MCV: 74.5 fL — ABNORMAL LOW (ref 80.0–100.0)
MPV: 11.7 fL (ref 7.5–12.5)
Platelets: 210 10*3/uL (ref 140–400)
RBC: 5.46 10*6/uL (ref 4.20–5.80)
RDW: 13.6 % (ref 11.0–15.0)
WBC: 3.9 10*3/uL (ref 3.8–10.8)

## 2017-06-25 LAB — LIPID PANEL
CHOL/HDL RATIO: 4 (calc) (ref <5.0)
Cholesterol: 96 mg/dL (ref <200)
HDL: 24 mg/dL — ABNORMAL LOW (ref >40)
LDL CHOLESTEROL (CALC): 52 mg/dL
Non-HDL Cholesterol (Calc): 72 mg/dL (calc) (ref <130)
TRIGLYCERIDES: 122 mg/dL (ref <150)

## 2017-06-25 LAB — T-HELPER CELL (CD4) - (RCID CLINIC ONLY)
CD4 % Helper T Cell: <1 % — ABNORMAL LOW (ref 33–55)
CD4 T Cell Abs: 10 /uL — ABNORMAL LOW (ref 400–2700)

## 2017-06-25 LAB — RPR: RPR Ser Ql: NONREACTIVE

## 2017-07-07 LAB — HIV-1 RNA ULTRAQUANT REFLEX TO GENTYP+
HIV 1 RNA QUANT: 705000 {copies}/mL — AB
HIV-1 RNA Quant, Log: 5.85 Log cps/mL — ABNORMAL HIGH

## 2017-07-07 LAB — RFLX HIV-1 INTEGRASE GENOTYPE: HIV-1 GENOTYPE: DETECTED — AB

## 2017-07-08 ENCOUNTER — Ambulatory Visit (INDEPENDENT_AMBULATORY_CARE_PROVIDER_SITE_OTHER): Payer: Self-pay | Admitting: Infectious Diseases

## 2017-07-08 ENCOUNTER — Encounter: Payer: Self-pay | Admitting: Infectious Diseases

## 2017-07-08 VITALS — BP 101/73 | HR 99 | Temp 97.8°F | Wt 130.0 lb

## 2017-07-08 DIAGNOSIS — B3781 Candidal esophagitis: Secondary | ICD-10-CM

## 2017-07-08 DIAGNOSIS — Z23 Encounter for immunization: Secondary | ICD-10-CM

## 2017-07-08 DIAGNOSIS — B370 Candidal stomatitis: Secondary | ICD-10-CM

## 2017-07-08 DIAGNOSIS — B20 Human immunodeficiency virus [HIV] disease: Secondary | ICD-10-CM

## 2017-07-08 MED ORDER — BICTEGRAVIR-EMTRICITAB-TENOFOV 50-200-25 MG PO TABS: 1.0000 | ORAL_TABLET | Freq: Every day | ORAL | 1 refills | Status: DC

## 2017-07-08 MED ORDER — SULFAMETHOXAZOLE-TRIMETHOPRIM 800-160 MG PO TABS: 1.0000 | ORAL_TABLET | Freq: Every day | ORAL | 0 refills | Status: DC

## 2017-07-08 MED ORDER — FLUCONAZOLE 100 MG PO TABS: 100.0000 mg | ORAL_TABLET | Freq: Every day | ORAL | 0 refills | Status: DC

## 2017-07-08 MED ORDER — BICTEGRAVIR-EMTRICITAB-TENOFOV 50-200-25 MG PO TABS: 1.0000 | ORAL_TABLET | Freq: Every day | ORAL | 5 refills | Status: DC

## 2017-07-08 MED ORDER — ELVITEG-COBIC-EMTRICIT-TENOFAF 150-150-200-10 MG PO TABS: 1.0000 | ORAL_TABLET | Freq: Every day | ORAL | 0 refills | Status: DC

## 2017-07-08 MED ORDER — AZITHROMYCIN 600 MG PO TABS: 1200.0000 mg | ORAL_TABLET | ORAL | 0 refills | Status: DC

## 2017-07-08 NOTE — Progress Notes (Signed)
HPI: Jon Love is a 42 y.o. male who is here to see Dr. Ninetta LightsHatcher for his HIV visit.   Allergies: Allergies  Allergen Reactions  . Penicillins Hives and Itching    Has patient had a PCN reaction causing immediate rash, facial/tongue/throat swelling, SOB or lightheadedness with hypotension: Yes Has patient had a PCN reaction causing severe rash involving mucus membranes or skin necrosis: No Has patient had a PCN reaction that required hospitalization No Has patient had a PCN reaction occurring within the last 10 years: No If all of the above answers are "NO", then may proceed with Cephalosporin use.    Vitals: Temp: 97.8 F (36.6 C) (01/30 1027) Temp Source: Oral (01/30 1027) BP: 101/73 (01/30 1027) Pulse Rate: 99 (01/30 1027)  Past Medical History: Past Medical History:  Diagnosis Date  . HIV disease (HCC)     Social History: Social History   Socioeconomic History  . Marital status: Single    Spouse name: None  . Number of children: None  . Years of education: None  . Highest education level: None  Social Needs  . Financial resource strain: None  . Food insecurity - worry: None  . Food insecurity - inability: None  . Transportation needs - medical: None  . Transportation needs - non-medical: None  Occupational History  . None  Tobacco Use  . Smoking status: Never Smoker  . Smokeless tobacco: Never Used  Substance and Sexual Activity  . Alcohol use: Yes    Alcohol/week: 0.0 oz    Comment: socially  . Drug use: No  . Sexual activity: No    Partners: Female    Comment: pt given condoms  Other Topics Concern  . None  Social History Narrative  . None    Previous Regimen: Genvoya  Current Regimen: Genvoya  Labs: HIV 1 RNA Quant  Date Value  06/24/2017 705,000 Copies/mL (H)  11/02/2015 7,829,5621,247,133 copies/mL (H)  08/15/2015 391,000 copies/mL   CD4 T Cell Abs (/uL)  Date Value  06/24/2017 10 (L)  11/02/2015 10 (L)  08/15/2015 20 (L)    Hepatitis B Surface Ag (no units)  Date Value  10/07/2014 NEGATIVE   HCV Ab (no units)  Date Value  10/07/2014 NEGATIVE    CrCl: CrCl cannot be calculated (Unknown ideal weight.).  Lipids:    Component Value Date/Time   CHOL 96 06/24/2017 1213   TRIG 122 06/24/2017 1213   HDL 24 (L) 06/24/2017 1213   CHOLHDL 4.0 06/24/2017 1213    Assessment: Jon Love is here for his HIV visit. He has a terrible hx of adherence because his VL has never been suppressed. He doesn't speak any english so we have to use the translating iPad. When Dr. Ninetta LightsHatcher approached us about helping with getting him meds. Apparently, his ADAP is expired in November and Con MemosMichelle Evans send in his application in mid Jan. We got him 30d of biktarvy through [facility]. [facility] will mail to him. We will also fax in his application just in case he needs it beyond 30d.   Recommendations:  Change Genvoya to Biktarvy 1 PO qday [facility]  Jon Love, PharmD, BCPS, AAHIVP, CPP Clinical Infectious Disease Pharmacist [facility] 07/08/2017, 4:37 PM

## 2017-07-08 NOTE — Assessment & Plan Note (Signed)
Will start him on diflucan.  Stop myeclex.

## 2017-07-08 NOTE — Progress Notes (Signed)
   Subjective:    Patient ID: Jon Love, male    DOB: 1975/09/22, 42 y.o.   MRN: [id number]  HPI 42yo [name] in [place] for 10 yrs, [facility] with abd pain. He underwent lap-chole and was also found to be HIV+.  He was started on genvoya which he was not been able to get.  He was again in hospital Oct 2016 with pneumonia and presumed PCP.   He has had issues with adherence. [facility]. His last ID visit was 05-2016. He has been off his meds since October. He is not able to explain why he ran out- states he did not have refills and that he did call clinic.  Has labs repeated 06-24-17 (see below) and geno (naive) He had ED visit 06-23-17 for groin mass that was felt to be LAN. He was also noted to have warts. He was given tramadol.  This is unchanged.   HIV 1 RNA Quant  Date Value  06/24/2017 705,000 Copies/mL (H)  11/02/2015 4,098,1191,247,133 copies/mL (H)  08/15/2015 391,000 copies/mL   CD4 T Cell Abs (/uL)  Date Value  06/24/2017 10 (L)  11/02/2015 10 (L)  08/15/2015 20 (L)    Review of Systems  Constitutional: Positive for appetite change and unexpected weight change. Negative for fever.  Gastrointestinal: Positive for abdominal pain and diarrhea. Negative for constipation.  Genitourinary: Negative for difficulty urinating.  Musculoskeletal: Positive for myalgias.  Neurological: Positive for headaches.       Objective:   Physical Exam  Constitutional: He appears well-developed and well-nourished.  HENT:  Mouth/Throat: Oropharyngeal exudate present.  Eyes: EOM are normal. Pupils are equal, round, and reactive to light.  Neck: Neck supple.  Cardiovascular: Normal rate, regular rhythm and normal heart sounds.  Pulmonary/Chest: Effort normal and breath sounds normal.  Abdominal: Soft. Bowel sounds are normal. There is no tenderness. There is no rebound.  Genitourinary:     Lymphadenopathy:    He has no cervical adenopathy.    Psychiatric: He has a normal mood and affect.       Assessment & Plan:

## 2017-07-08 NOTE — Assessment & Plan Note (Addendum)
Will re-start genvoya, bactrim, azithro.  Flu shot today Will get him in with [facility] (ADAP expired).  Offered condoms Will watch his LN after restarting his meds.  rtc in 1 month

## 2017-07-08 NOTE — Addendum Note (Signed)
Addended by: Nicholes CalamityPHAM, Iyonna Rish Q on: 07/08/2017 12:29 PM   Modules accepted: Orders

## 2017-07-27 ENCOUNTER — Telehealth: Payer: Self-pay | Admitting: *Deleted

## 2017-07-27 NOTE — Telephone Encounter (Signed)
Patient in today to report that he has had 4 days of diarrhea and would like to know what to take to stop it. Reports no other symptoms. Advised will ask the doctor and give him a call back once he responds.

## 2017-07-27 NOTE — Telephone Encounter (Signed)
Ok to have imodium or lomotil

## 2017-07-27 NOTE — Telephone Encounter (Signed)
Called the patient with the help pt [facility] FamManuel and informed to take the Imodium and stay hydrated.

## 2017-07-29 ENCOUNTER — Encounter: Payer: Self-pay | Admitting: Infectious Diseases

## 2017-08-10 ENCOUNTER — Ambulatory Visit: Payer: Self-pay | Admitting: Infectious Diseases

## 2017-08-31 ENCOUNTER — Other Ambulatory Visit: Payer: Self-pay | Admitting: *Deleted

## 2017-08-31 DIAGNOSIS — B20 Human immunodeficiency virus [HIV] disease: Secondary | ICD-10-CM

## 2017-08-31 MED ORDER — SULFAMETHOXAZOLE-TRIMETHOPRIM 800-160 MG PO TABS: 1.0000 | ORAL_TABLET | Freq: Every day | ORAL | 3 refills | Status: DC

## 2017-08-31 MED ORDER — AZITHROMYCIN 600 MG PO TABS: 1200.0000 mg | ORAL_TABLET | ORAL | 3 refills | Status: DC

## 2017-08-31 NOTE — Progress Notes (Signed)
Patient called Jon Love for help getting refills of his medication. Patient's ADAP has been approved, he should use [facility] for his Biktarvy, Bactrim and Azithromycin.  RN worked with Jon Love, pharmacy, and patient to make sure his medications were correct and in good supply. Patient needs follow up in clinic, no-showed 3/4.  He requests Thursday. Patient scheduled with Pharmacy on Thursday 3/28 at 1:30.  Manuel relayed all information to him. Andree CossHowell, [place] Strieter M, RN

## 2017-09-03 ENCOUNTER — Ambulatory Visit (INDEPENDENT_AMBULATORY_CARE_PROVIDER_SITE_OTHER): Payer: Self-pay | Admitting: Pharmacist Clinician (PhC)/ Clinical Pharmacy Specialist

## 2017-09-03 DIAGNOSIS — B20 Human immunodeficiency virus [HIV] disease: Secondary | ICD-10-CM

## 2017-09-03 NOTE — Progress Notes (Signed)
HPI: Lucinda Dellntonio Hernandez is a 42 y.o. male who is here for his HIV visit with pharmacy due to his poor adherence.   Allergies: Allergies  Allergen Reactions  . Penicillins Hives and Itching    Has patient had a PCN reaction causing immediate rash, facial/tongue/throat swelling, SOB or lightheadedness with hypotension: Yes Has patient had a PCN reaction causing severe rash involving mucus membranes or skin necrosis: No Has patient had a PCN reaction that required hospitalization No Has patient had a PCN reaction occurring within the last 10 years: No If all of the above answers are "NO", then may proceed with Cephalosporin use.    Vitals:    Past Medical History: Past Medical History:  Diagnosis Date  . HIV disease (HCC)     Social History: Social History   Socioeconomic History  . Marital status: Single    Spouse name: Not on file  . Number of children: Not on file  . Years of education: Not on file  . Highest education level: Not on file  Occupational History  . Not on file  Social Needs  . Financial resource strain: Not on file  . Food insecurity:    Worry: Not on file    Inability: Not on file  . Transportation needs:    Medical: Not on file    Non-medical: Not on file  Tobacco Use  . Smoking status: Never Smoker  . Smokeless tobacco: Never Used  Substance and Sexual Activity  . Alcohol use: Yes    Alcohol/week: 0.0 oz    Comment: socially  . Drug use: No  . Sexual activity: Never    Partners: Female    Comment: pt given condoms  Lifestyle  . Physical activity:    Days per week: Not on file    Minutes per session: Not on file  . Stress: Not on file  Relationships  . Social connections:    Talks on phone: Not on file    Gets together: Not on file    Attends religious service: Not on file    Active member of club or organization: Not on file    Attends meetings of clubs or organizations: Not on file    Relationship status: Not on file  Other Topics  Concern  . Not on file  Social History Narrative  . Not on file    Previous Regimen: Genvoya  Current Regimen: Biktarvy  Labs: HIV 1 RNA Quant  Date Value  06/24/2017 705,000 Copies/mL (H)  11/02/2015 0,981,1911,247,133 copies/mL (H)  08/15/2015 391,000 copies/mL   CD4 T Cell Abs (/uL)  Date Value  06/24/2017 10 (L)  11/02/2015 10 (L)  08/15/2015 20 (L)   Hepatitis B Surface Ag (no units)  Date Value  10/07/2014 NEGATIVE   HCV Ab (no units)  Date Value  10/07/2014 NEGATIVE    CrCl: CrCl cannot be calculated (Patient's most recent lab result is older than the maximum 21 days allowed.).  Lipids:    Component Value Date/Time   CHOL 96 06/24/2017 1213   TRIG 122 06/24/2017 1213   HDL 24 (L) 06/24/2017 1213   CHOLHDL 4.0 06/24/2017 1213   LDLCALC 52 06/24/2017 1213      Assessment: Makoa has an extensive history of non-adherence. He has numerous issues due to language and communication. [facility] FamManuel and an interpreter were present today. Apparently, he can't read or write. [facility] [place] couldn't contact him to send meds because he doesn't pick up the phone due to his  job. He only have about 1 hr break around 1pm on certain days. He has complained of thrush in his mouth. Dr. Ninetta Lights did send in 10 days of fluconazole and he has it here today but never started on it due to the reasons above. Took a picture of it. He has not taken anything since the last visit (Biktarvy/azithromycin/septra). Therefore, we are going to take a difference approach to handle his care. We are filling 4 pill boxes for him today with fluconazole in the first 10 days. He took the first dose of all meds today and this will count as D#1. Since he can't read, we numbered the pill boxes (1-28) instead relying on days. He was able to point out the number correctly. Told him that if he takes his pills, his condition will improved dramatically.   Kelby Fam will help with the refill issue. If the pharmacy  can't get in touch with him, they will call [facility]. We will see him back in 25 days. We [facility] [place].   Recommendations:  Fluconazole 100 daily x 10d Start Biktarvy 1 PO qday Start Azith 600mg  PO qwk Start Bactrim SS 1 PO qday F/u back in 25 days  Ulyses Southward, PharmD, BCPS, AAHIVP, CPP Clinical Infectious Disease Pharmacist [facility] 09/03/2017, 2:38 PM

## 2017-09-28 ENCOUNTER — Other Ambulatory Visit: Payer: Self-pay | Admitting: Infectious Diseases

## 2017-09-28 ENCOUNTER — Ambulatory Visit: Payer: Self-pay

## 2017-09-28 DIAGNOSIS — B370 Candidal stomatitis: Secondary | ICD-10-CM

## 2017-09-28 DIAGNOSIS — B3781 Candidal esophagitis: Secondary | ICD-10-CM

## 2017-11-13 ENCOUNTER — Other Ambulatory Visit: Payer: Self-pay | Admitting: *Deleted

## 2017-11-13 DIAGNOSIS — B20 Human immunodeficiency virus [HIV] disease: Secondary | ICD-10-CM

## 2017-11-13 MED ORDER — SULFAMETHOXAZOLE-TRIMETHOPRIM 800-160 MG PO TABS: 1.0000 | ORAL_TABLET | Freq: Every day | ORAL | 5 refills | Status: DC

## 2018-01-14 NOTE — Telephone Encounter (Signed)
See note

## 2018-09-21 ENCOUNTER — Telehealth: Payer: Self-pay

## 2018-09-21 NOTE — Telephone Encounter (Signed)
Patient has been inconsistent with care. Called Deklin and left message for him to give office a call back.   Gerarda Fraction, [place]

## 2018-12-05 ENCOUNTER — Emergency Department (HOSPITAL_COMMUNITY): Payer: Self-pay

## 2018-12-05 ENCOUNTER — Inpatient Hospital Stay (HOSPITAL_COMMUNITY)
Admission: EM | Admit: 2018-12-05 | Discharge: 2018-12-10 | DRG: 871 | Disposition: A | Discharge status: Home / Self Care | Payer: Self-pay | Attending: Internal Medicine | Admitting: Internal Medicine

## 2018-12-05 ENCOUNTER — Encounter (HOSPITAL_COMMUNITY): Payer: Self-pay

## 2018-12-05 ENCOUNTER — Other Ambulatory Visit: Payer: Self-pay

## 2018-12-05 DIAGNOSIS — B370 Candidal stomatitis: Secondary | ICD-10-CM | POA: Diagnosis present

## 2018-12-05 DIAGNOSIS — R0902 Hypoxemia: Secondary | ICD-10-CM

## 2018-12-05 DIAGNOSIS — B20 Human immunodeficiency virus [HIV] disease: Secondary | ICD-10-CM | POA: Diagnosis present

## 2018-12-05 DIAGNOSIS — A419 Sepsis, unspecified organism: Principal | ICD-10-CM | POA: Diagnosis present

## 2018-12-05 DIAGNOSIS — Z789 Other specified health status: Secondary | ICD-10-CM

## 2018-12-05 DIAGNOSIS — Z21 Asymptomatic human immunodeficiency virus [HIV] infection status: Secondary | ICD-10-CM | POA: Diagnosis present

## 2018-12-05 DIAGNOSIS — E860 Dehydration: Secondary | ICD-10-CM | POA: Diagnosis present

## 2018-12-05 DIAGNOSIS — Z1159 Encounter for screening for other viral diseases: Secondary | ICD-10-CM

## 2018-12-05 DIAGNOSIS — J188 Other pneumonia, unspecified organism: Secondary | ICD-10-CM | POA: Diagnosis present

## 2018-12-05 DIAGNOSIS — E871 Hypo-osmolality and hyponatremia: Secondary | ICD-10-CM

## 2018-12-05 DIAGNOSIS — J9601 Acute respiratory failure with hypoxia: Secondary | ICD-10-CM | POA: Diagnosis present

## 2018-12-05 DIAGNOSIS — F1090 Alcohol use, unspecified, uncomplicated: Secondary | ICD-10-CM

## 2018-12-05 DIAGNOSIS — D509 Iron deficiency anemia, unspecified: Secondary | ICD-10-CM | POA: Diagnosis present

## 2018-12-05 DIAGNOSIS — J984 Other disorders of lung: Secondary | ICD-10-CM

## 2018-12-05 DIAGNOSIS — Z7289 Other problems related to lifestyle: Secondary | ICD-10-CM

## 2018-12-05 DIAGNOSIS — J189 Pneumonia, unspecified organism: Secondary | ICD-10-CM

## 2018-12-05 HISTORY — DX: Other specified health status: Z78.9

## 2018-12-05 LAB — URINALYSIS, ROUTINE W REFLEX MICROSCOPIC
Bilirubin Urine: NEGATIVE
Glucose, UA: NEGATIVE mg/dL
Hgb urine dipstick: NEGATIVE
Ketones, ur: NEGATIVE mg/dL
Leukocytes,Ua: NEGATIVE
Nitrite: NEGATIVE
Protein, ur: NEGATIVE mg/dL
Specific Gravity, Urine: 1.017 (ref 1.005–1.030)
pH: 7 (ref 5.0–8.0)

## 2018-12-05 LAB — COMPREHENSIVE METABOLIC PANEL
ALT: 18 U/L (ref 0–44)
AST: 24 U/L (ref 15–41)
Albumin: 3.2 g/dL — ABNORMAL LOW (ref 3.5–5.0)
Alkaline Phosphatase: 96 U/L (ref 38–126)
Anion gap: 8 (ref 5–15)
BUN: 8 mg/dL (ref 6–20)
CO2: 25 mmol/L (ref 22–32)
Calcium: 9 mg/dL (ref 8.9–10.3)
Chloride: 98 mmol/L (ref 98–111)
Creatinine, Ser: 0.78 mg/dL (ref 0.61–1.24)
GFR calc Af Amer: >60 mL/min (ref >60)
GFR calc non Af Amer: >60 mL/min (ref >60)
Glucose, Bld: 101 mg/dL — ABNORMAL HIGH (ref 70–99)
Potassium: 4.5 mmol/L (ref 3.5–5.1)
Sodium: 131 mmol/L — ABNORMAL LOW (ref 135–145)
Total Bilirubin: 1.4 mg/dL — ABNORMAL HIGH (ref 0.3–1.2)
Total Protein: 6.4 g/dL — ABNORMAL LOW (ref 6.5–8.1)

## 2018-12-05 LAB — CBC WITH DIFFERENTIAL/PLATELET
Abs Immature Granulocytes: 0.09 10*3/uL — ABNORMAL HIGH (ref 0.00–0.07)
Basophils Absolute: 0 10*3/uL (ref 0.0–0.1)
Basophils Relative: 0 %
Eosinophils Absolute: 0.2 10*3/uL (ref 0.0–0.5)
Eosinophils Relative: 2 %
HCT: 40.1 % (ref 39.0–52.0)
Hemoglobin: 13.2 g/dL (ref 13.0–17.0)
Immature Granulocytes: 1 %
Lymphocytes Relative: 26 %
Lymphs Abs: 3.3 10*3/uL (ref 0.7–4.0)
MCH: 25.1 pg — ABNORMAL LOW (ref 26.0–34.0)
MCHC: 32.9 g/dL (ref 30.0–36.0)
MCV: 76.4 fL — ABNORMAL LOW (ref 80.0–100.0)
Monocytes Absolute: 1 10*3/uL (ref 0.1–1.0)
Monocytes Relative: 8 %
Neutro Abs: 8.2 10*3/uL — ABNORMAL HIGH (ref 1.7–7.7)
Neutrophils Relative %: 63 %
Platelets: 309 10*3/uL (ref 150–400)
RBC: 5.25 MIL/uL (ref 4.22–5.81)
RDW: 16.8 % — ABNORMAL HIGH (ref 11.5–15.5)
WBC: 12.8 10*3/uL — ABNORMAL HIGH (ref 4.0–10.5)
nRBC: 0 % (ref 0.0–0.2)

## 2018-12-05 LAB — SARS CORONAVIRUS 2 BY RT PCR (HOSPITAL ORDER, PERFORMED IN ~~LOC~~ HOSPITAL LAB): SARS Coronavirus 2: NEGATIVE

## 2018-12-05 LAB — LACTIC ACID, PLASMA
Lactic Acid, Venous: 1.4 mmol/L (ref 0.5–1.9)
Lactic Acid, Venous: 1 mmol/L (ref 0.5–1.9)

## 2018-12-05 IMAGING — DX PORTABLE CHEST - 1 VIEW
2 series · 2 of 2 positions shown · non-contrast
Comparison: None.

CLINICAL DATA: Dizziness and pain.  Cough.

EXAM:
PORTABLE CHEST 1 VIEW

[chest ap (1 of 2)]
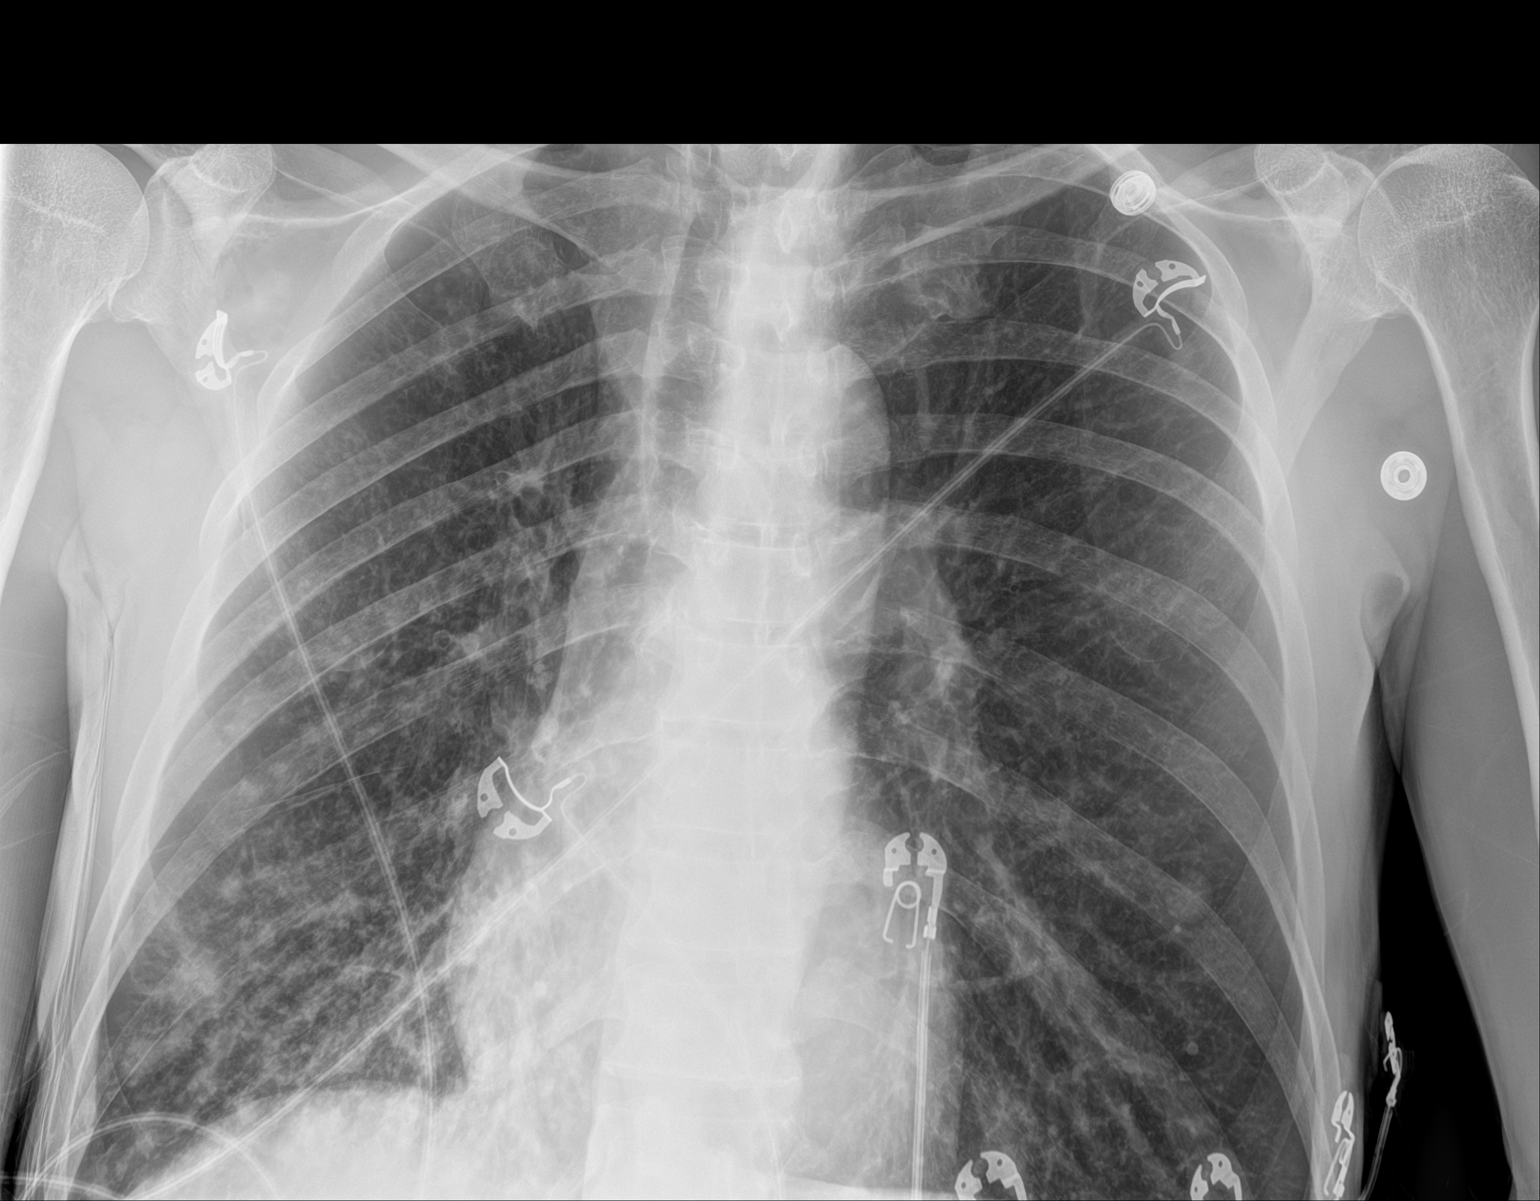

[chest ap (2 of 2)]
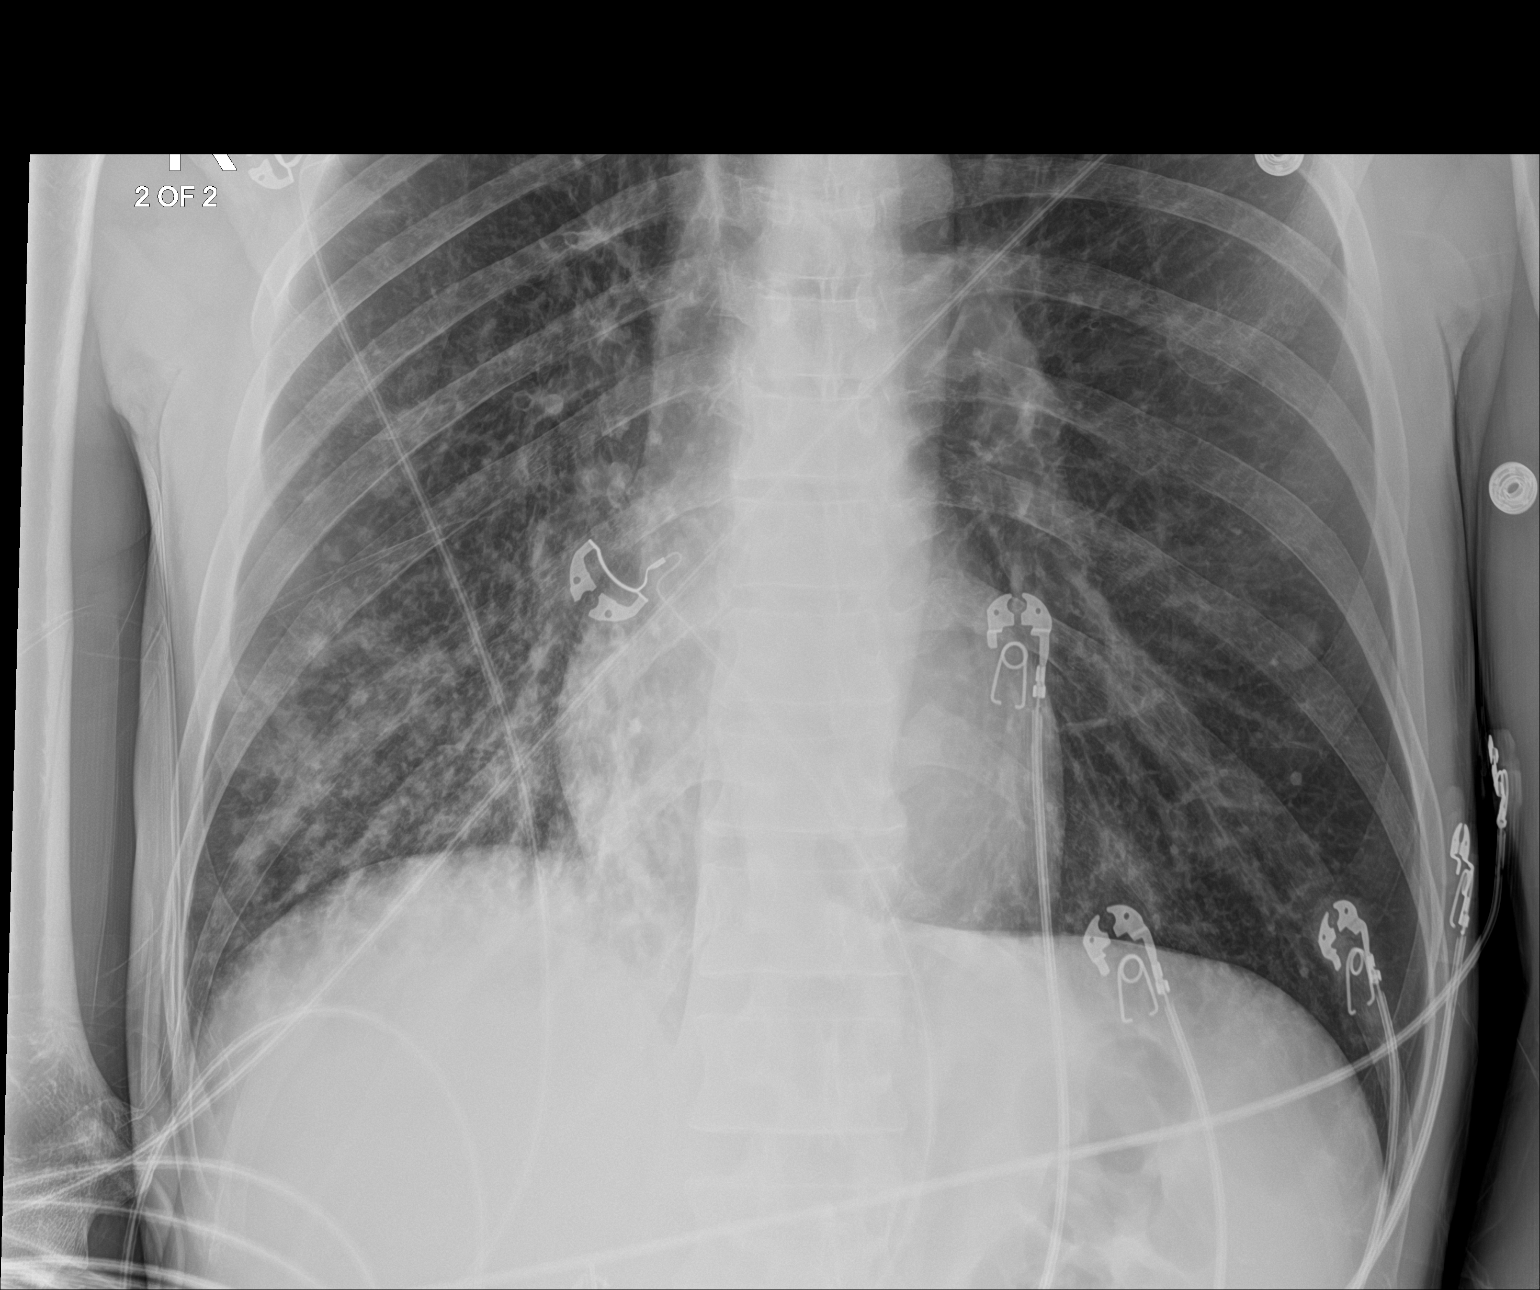

[2 of 2 positions shown; findings below may reference images not displayed]

FINDINGS: There is a probable nipple shadow on the left. There is patchy
airspace opacity with several somewhat nodular appearing areas in
the right base, likely due to pneumonia. Several 4-5 mm nodular
opacities are also noted in the right mid lung. Lungs elsewhere are
clear. Heart size and pulmonary vascular normal. No adenopathy. No
bone lesions.
IMPRESSION: Apparent pneumonia in portions of the right mid and lower lung
zones. Underlying nodular opacities may represent foci of pneumonia.
Septic emboli or neoplastic etiology also possible for these
opacities. None of these lesions show cavitation.

Suspect nipple shadow on the left; repeat study with nipple markers
advised to further evaluate.

No adenopathy.

Followup PA and lateral chest radiographs recommended in 3-4 weeks
following trial of antibiotic therapy to ensure resolution and
exclude underlying malignancy.

## 2018-12-05 MED ORDER — ACETAMINOPHEN 325 MG PO TABS
650.0000 mg | ORAL_TABLET | Freq: Four times a day (QID) | ORAL | Status: DC | PRN
  Administered 2018-12-05 – 2018-12-10 (×6): 650 mg via ORAL
  Filled 2018-12-05 (×6): qty 2

## 2018-12-05 MED ORDER — ACETAMINOPHEN 650 MG RE SUPP: 650.0000 mg | Freq: Four times a day (QID) | RECTAL | Status: DC | PRN

## 2018-12-05 MED ORDER — SODIUM CHLORIDE 0.9 % IV BOLUS
30.0000 mL/kg | Freq: Once | INTRAVENOUS | Status: AC
  Administered 2018-12-05: 500 mL via INTRAVENOUS

## 2018-12-05 MED ORDER — GUAIFENESIN-DM 100-10 MG/5ML PO SYRP
5.0000 mL | ORAL_SOLUTION | ORAL | Status: DC | PRN
  Administered 2018-12-05: 5 mL via ORAL
  Filled 2018-12-05: qty 5

## 2018-12-05 MED ORDER — SODIUM CHLORIDE 0.9 % IV SOLN
500.0000 mg | INTRAVENOUS | Status: DC
  Administered 2018-12-05 – 2018-12-07 (×3): 500 mg via INTRAVENOUS
  Filled 2018-12-05 (×5): qty 500

## 2018-12-05 MED ORDER — SODIUM CHLORIDE 0.9 % IV SOLN
2.0000 g | INTRAVENOUS | Status: DC
  Administered 2018-12-05 – 2018-12-08 (×4): 2 g via INTRAVENOUS
  Filled 2018-12-05: qty 20
  Filled 2018-12-05: qty 2
  Filled 2018-12-05 (×3): qty 20
  Filled 2018-12-05: qty 2

## 2018-12-05 MED ORDER — IPRATROPIUM-ALBUTEROL 0.5-2.5 (3) MG/3ML IN SOLN: 3.0000 mL | Freq: Four times a day (QID) | RESPIRATORY_TRACT | Status: DC | PRN

## 2018-12-05 MED ORDER — ENOXAPARIN SODIUM 40 MG/0.4ML ~~LOC~~ SOLN
40.0000 mg | SUBCUTANEOUS | Status: DC
  Administered 2018-12-05 – 2018-12-09 (×5): 40 mg via SUBCUTANEOUS
  Filled 2018-12-05 (×5): qty 0.4

## 2018-12-05 MED ORDER — IPRATROPIUM-ALBUTEROL 0.5-2.5 (3) MG/3ML IN SOLN: 3.0000 mL | Freq: Four times a day (QID) | RESPIRATORY_TRACT | Status: DC

## 2018-12-05 MED ORDER — SODIUM CHLORIDE 0.9 % IV SOLN
INTRAVENOUS | Status: DC
  Administered 2018-12-05 – 2018-12-09 (×8): via INTRAVENOUS

## 2018-12-05 MED ORDER — NYSTATIN 100000 UNIT/ML MT SUSP
5.0000 mL | Freq: Four times a day (QID) | OROMUCOSAL | Status: DC
  Administered 2018-12-05 – 2018-12-10 (×19): 500000 [IU] via OROMUCOSAL
  Filled 2018-12-05 (×20): qty 5

## 2018-12-05 MED ORDER — LORATADINE 10 MG PO TABS
10.0000 mg | ORAL_TABLET | Freq: Every day | ORAL | Status: DC
  Administered 2018-12-05 – 2018-12-10 (×6): 10 mg via ORAL
  Filled 2018-12-05 (×6): qty 1

## 2018-12-05 NOTE — Plan of Care (Signed)
Patient admission conducted via interpreter [place].

## 2018-12-05 NOTE — Progress Notes (Signed)
Notified bedside nurse of need to administer fluid bolus. No weight appeared in charge for proper fluids. Bedside RN contacted for weight and then was able to obtain proper amount of fluids needed.

## 2018-12-05 NOTE — ED Provider Notes (Signed)
[facility] EMERGENCY DEPARTMENT Provider Note   CSN: [id number] Arrival date & time: 12/05/18  1343    History   Chief Complaint Chief Complaint  Patient presents with  . Generalized Body Aches    x2 days   . Dizziness    x2 daya    HPI Jon Love Shelbyvillentonio Derik Michel Love is a 43 y.o. male without significant past medical history, presenting to the emergency department with 3 weeks of productive cough and body aches.  States his symptoms have been somewhat worsening.  His cough is productive of a "Jell-O" like phlegm.  He states he feels as though his "bones ache."  He denies associated cough, difficulty breathing, sore throat, changes in taste or smell, abdominal symptoms, urinary symptoms.  He has no known sick contacts or any contact with COVID positive people.  He is treated symptoms with Tylenol.     The history is provided by the patient.    Past Medical History:  Diagnosis Date  . Known health problems: none     There are no active problems to display for this patient.   History reviewed. No pertinent surgical history.      Home Medications    Prior to Admission medications   Not on File    Family History No family history on file.  Social History Social History   Tobacco Use  . Smoking status: Never Smoker  . Smokeless tobacco: Never Used  Substance Use Topics  . Alcohol use: Yes    Alcohol/week: 6.0 standard drinks    Types: 6 Cans of beer per week  . Drug use: Not Currently     Allergies   Patient has no known allergies.   Review of Systems Review of Systems  Constitutional: Negative for fever.  Respiratory: Positive for cough. Negative for shortness of breath.   Musculoskeletal: Positive for myalgias (Generalized).  Neurological: Negative for headaches.  All other systems reviewed and are negative.    Physical Exam Updated Vital Signs BP 94/69   Pulse 82   Temp 100.2 F (37.9 C) (Rectal)   Resp 17   Ht 5\' 11"  (1.803  m)   Wt 77 kg   SpO2 95%   BMI 23.68 kg/m   Physical Exam Vitals signs and nursing note reviewed.  Constitutional:      General: He is not in acute distress.    Appearance: He is well-developed.     Comments: Thin appearing male.  HENT:     Head: Normocephalic and atraumatic.     Mouth/Throat:     Comments: Mucosa with thick white coating consistent with thrush.  Pharynx is clear with without swelling.  Tolerating secretions. Eyes:     Conjunctiva/sclera: Conjunctivae normal.  Neck:     Musculoskeletal: Normal range of motion and neck supple. No neck rigidity or muscular tenderness.  Cardiovascular:     Rate and Rhythm: Regular rhythm. Tachycardia present.  Pulmonary:     Effort: Pulmonary effort is normal.     Comments: Slight tachypnea, however.  Patient speaking in full sentences with normal respiratory effort.  There are rales in the right base with some fine expiratory wheezes bilaterally. Abdominal:     General: Bowel sounds are normal.     Palpations: Abdomen is soft.     Tenderness: There is no abdominal tenderness. There is no guarding or rebound.  Musculoskeletal:     Right lower leg: No edema.     Left lower leg: No edema.  Skin:  General: Skin is warm.  Neurological:     Mental Status: He is alert.  Psychiatric:        Behavior: Behavior normal.      ED Treatments / Results  Labs (all labs ordered are listed, but only abnormal results are displayed) Labs Reviewed  COMPREHENSIVE METABOLIC PANEL - Abnormal; Notable for the following components:      Result Value   Sodium 131 (*)    Glucose, Bld 101 (*)    Total Protein 6.4 (*)    Albumin 3.2 (*)    Total Bilirubin 1.4 (*)    All other components within normal limits  CBC WITH DIFFERENTIAL/PLATELET - Abnormal; Notable for the following components:   WBC 12.8 (*)    MCV 76.4 (*)    MCH 25.1 (*)    RDW 16.8 (*)    Neutro Abs 8.2 (*)    Abs Immature Granulocytes 0.09 (*)    All other components  within normal limits  SARS CORONAVIRUS 2 ([facility] ORDER, [place] [facility])  CULTURE, BLOOD (ROUTINE X 2)  CULTURE, BLOOD (ROUTINE X 2)  URINALYSIS, ROUTINE W REFLEX MICROSCOPIC  LACTIC ACID, PLASMA  LACTIC ACID, PLASMA    EKG EKG Interpretation  Date/Time:  Sunday December 05 2018 15:57:08 EDT Ventricular Rate:  83 PR Interval:    QRS Duration: 82 QT Interval:  351 QTC Calculation: 413 R Axis:   47 Text Interpretation:  Sinus rhythm ST elev, probable normal early repol pattern No old tracing to compare Confirmed by Jon Love [phone]) on 12/05/2018 4:05:06 PM   Radiology Dg Chest Port 1 View  Result Date: 12/05/2018 CLINICAL DATA:  Dizziness and pain.  Cough. EXAM: PORTABLE CHEST 1 VIEW COMPARISON:  None. FINDINGS: There is a probable nipple shadow on the left. There is patchy airspace opacity with several somewhat nodular appearing areas in the right base, likely due to pneumonia. Several 4-5 mm nodular opacities are also noted in the right mid lung. Lungs elsewhere are clear. Heart size and pulmonary vascular normal. No adenopathy. No bone lesions. IMPRESSION: Apparent pneumonia in portions of the right mid and lower lung zones. Underlying nodular opacities may represent foci of pneumonia. Septic emboli or neoplastic etiology also possible for these opacities. None of these lesions show cavitation. Suspect nipple shadow on the left; repeat study with nipple markers advised to further evaluate. No adenopathy. Followup PA and lateral chest radiographs recommended in 3-4 weeks following trial of antibiotic therapy to ensure resolution and exclude underlying malignancy. Electronically Signed   By: Lowella Grip III M.D.   On: 12/05/2018 16:34    Procedures Procedures (including critical care time) CRITICAL CARE Performed by: Jon Love   Total critical care time: 35 minutes  Critical care time was exclusive of separately billable procedures and treating  other patients.  Critical care was necessary to treat or prevent imminent or life-threatening deterioration.  Critical care was time spent personally by me on the following activities: development of treatment plan with patient and/or surrogate as well as nursing, discussions with consultants, evaluation of patient's response to treatment, examination of patient, obtaining history from patient or surrogate, ordering and performing treatments and interventions, ordering and review of laboratory studies, ordering and review of radiographic studies, pulse oximetry and re-evaluation of patient's condition.  Medications Ordered in ED Medications  cefTRIAXone (ROCEPHIN) 2 g in sodium chloride 0.9 % 100 mL IVPB (0 g Intravenous Stopped 12/05/18 1607)  azithromycin (ZITHROMAX) 500 mg in sodium  chloride 0.9 % 250 mL IVPB (500 mg Intravenous New Bag/Given 12/05/18 1652)  sodium chloride 0.9 % bolus 30 mL/kg (500 mLs Intravenous New Bag/Given 12/05/18 1548)    Jon Love was evaluated in Emergency Department on 12/05/2018 for the symptoms described in the history of present illness. He was evaluated in the context of the global COVID-19 pandemic, which necessitated consideration that the patient might be at risk for infection with the SARS-CoV-2 virus that causes COVID-19. Institutional protocols and algorithms that pertain to the evaluation of patients at risk for COVID-19 are in a state of rapid change based on information released by regulatory bodies including the CDC and federal and state organizations. These policies and algorithms were followed during the patient's care in the ED.  Initial Impression / Assessment and Plan / ED Course  I have reviewed the triage vital signs and the nursing notes.  Pertinent labs & imaging results that were available during my care of the patient were reviewed by me and considered in my medical decision making (see chart for details).  Sepsis - Repeat  Assessment  Performed at:    1630  Vitals     Blood pressure 94/69, pulse 82, temperature 100.2 F (37.9 C), temperature source Rectal, resp. rate 17, height 5\' 11"  (1.803 m), weight 77 kg, SpO2 95 %.  Heart:     Regular rate and rhythm  Lungs:    Rales and Wheezing  Capillary Refill:   <2 sec  Peripheral Pulse:   Radial pulse palpable  Skin:     Normal Color          Patient presenting with 3 weeks of body aches and productive cough.  Patient with low-grade rectal temp of 100.3 F.  Patient tachycardic on arrival, became slightly hypotensive.  Patient was also hypoxic on room air 90%, improved to 95 with 2 L nasal cannula.  Lungs with rales in the right base and some mild expiratory wheezes.  Code sepsis initiated, fluid resuscitation administered.  Antibiotics to cover community-acquired pneumonia.  Labs with leukocytosis of 12.8.  Mild hyponatremia 131.  Lactic within normal limits.  UA negative.  COVID test is negative.  Chest x-ray consistent with right middle and lower lobe pneumonia.  Of note, patient also had exam consistent with oral thrush on exam, will need treatment.  At this time will admit for hypoxia and sepsis due to community-acquired pneumonia.   Final Clinical Impressions(s) / ED Diagnoses   Final diagnoses:  Oral thrush  Community acquired pneumonia of right lower lobe of lung (HCC)  Sepsis without acute organ dysfunction, due to unspecified organism [facility](HCC)  Hypoxia    ED Discharge Orders    None       Jon Love, [place] N, PA-C 12/05/18 1812    Eber HongMiller, Brian, MD 12/07/18 [phone]

## 2018-12-05 NOTE — ED Notes (Signed)
Pt has spoken with friend

## 2018-12-05 NOTE — ED Notes (Signed)
Report attempted 

## 2018-12-05 NOTE — ED Triage Notes (Signed)
Patient's friend/ride, Pricilla Riffle, may be reached at [phone].

## 2018-12-05 NOTE — ED Notes (Signed)
ED TO INPATIENT HANDOFF REPORT  ED Nurse Name and Phone #: Lanora Manislizabeth [phone]  [facility] 43 y.[facility]. male Room/Bed: 039C/039C  Code Status   Code Status: Full Code  Home/SNF/Other Home Patient oriented to: self, place, time and situation Is this baseline? Yes   Triage Complete: Triage complete  Chief Complaint Bodyaches; dizzy  Triage Note Patient's friend/ride, Montez Hagemandgar Flores, may be reached at [phone].   Pt reports bones hurting and dizziness x2 days and flu like for 3 weeks with pain in back and lungs. Denies, fever, NVD.    Allergies No Known Allergies  Level of Care/Admitting Diagnosis ED Disposition    ED Disposition Condition Comment   Admit  [facility]: [facility] [[id number]]  Level of Care: Med-Surg [16]  I expect the patient will be discharged within 24 hours: No (not a candidate for 5C-Observation unit)  Covid Evaluation: Confirmed COVID Negative  Diagnosis: Pneumonia [227785]  Admitting Physician: Alessandra BevelsKAMINENI, NEELIMA [[id number]]  Attending Physician: Alessandra BevelsKAMINENI, NEELIMA [[id number]]  PT Class (Do Not Modify): Observation [104]  PT Acc Code (Do Not Modify): Observation [10022]       B Medical/Surgery History Past Medical History:  Diagnosis Date  . Known health problems: none    History reviewed. No pertinent surgical history.   A IV Location/Drains/Wounds Patient Lines/Drains/Airways Status   Active Line/Drains/Airways    Name:   Placement date:   Placement time:   Site:   Days:   Peripheral IV 12/05/18 Right Antecubital   12/05/18    1501    Antecubital   less than 1          Intake/Output Last 24 hours  Intake/Output Summary (Last 24 hours) at 12/05/2018 1954 Last data filed at 12/05/2018 1952 Gross per 24 hour  Intake 345.58 ml  Output 700 ml  Net -354.42 ml    Labs/Imaging Results for orders placed or performed during the hospital encounter of 12/05/18 (from the past 48 hour(s))   Lactic acid, plasma     Status: None   Collection Time: 12/05/18  2:57 PM  Result Value Ref Range   Lactic Acid, Venous 1.4 0.5 - 1.9 mmol/L    Comment: Performed at [facility], [address] [place], [place] [address]  Comprehensive metabolic panel     Status: Abnormal   Collection Time: 12/05/18  3:00 PM  Result Value Ref Range   Sodium 131 (L) 135 - 145 mmol/L   Potassium 4.5 3.5 - 5.1 mmol/L   Chloride 98 98 - 111 mmol/L   CO2 25 22 - 32 mmol/L   Glucose, Bld 101 (H) 70 - 99 mg/dL   BUN 8 6 - 20 mg/dL   Creatinine, Ser 6.060.78 0.61 - 1.24 mg/dL   Calcium 9.0 8.9 - 30.110.3 mg/dL   Total Protein 6.4 (L) 6.5 - 8.1 g/dL   Albumin 3.2 (L) 3.5 - 5.0 g/dL   AST 24 15 - 41 U/L   ALT 18 0 - 44 U/L   Alkaline Phosphatase 96 38 - 126 U/L   Total Bilirubin 1.4 (H) 0.3 - 1.2 mg/dL   GFR calc non Af Amer >60 >60 mL/min   GFR calc Af Amer >60 >60 mL/min   Anion gap 8 5 - 15    Comment: Performed at [facility], [address] [place], [place] [address]  CBC with Differential     Status: Abnormal   Collection Time: 12/05/18  3:00 PM  Result  Value Ref Range   WBC 12.8 (H) 4.0 - 10.5 K/uL   RBC 5.25 4.22 - 5.81 MIL/uL   Hemoglobin 13.2 13.0 - 17.0 g/dL   HCT 40.1 39.0 - 52.0 %   MCV 76.4 (L) 80.0 - 100.0 fL   MCH 25.1 (L) 26.0 - 34.0 pg   MCHC 32.9 30.0 - 36.0 g/dL   RDW 16.8 (H) 11.5 - 15.5 %   Platelets 309 150 - 400 K/uL   nRBC 0.0 0.0 - 0.2 %   Neutrophils Relative % 63 %   Neutro Abs 8.2 (H) 1.7 - 7.7 K/uL   Lymphocytes Relative 26 %   Lymphs Abs 3.3 0.7 - 4.0 K/uL   Monocytes Relative 8 %   Monocytes Absolute 1.0 0.1 - 1.0 K/uL   Eosinophils Relative 2 %   Eosinophils Absolute 0.2 0.0 - 0.5 K/uL   Basophils Relative 0 %   Basophils Absolute 0.0 0.0 - 0.1 K/uL   Immature Granulocytes 1 %   Abs Immature Granulocytes 0.09 (H) 0.00 - 0.07 K/uL    Comment: Performed at [facility], [address] [place], [place] [address]  SARS Coronavirus 2 (CEPHEID-  Performed in [place] hospital lab), Hosp Order     Status: None   Collection Time: 12/05/18  3:59 PM   Specimen: Nasopharyngeal Swab  Result Value Ref Range   SARS Coronavirus 2 NEGATIVE NEGATIVE    Comment: (NOTE) If result is NEGATIVE SARS-CoV-2 target nucleic acids are NOT DETECTED. The SARS-CoV-2 RNA is generally detectable in upper and lower  respiratory specimens during the acute phase of infection. The lowest  concentration of SARS-CoV-2 viral copies this assay can detect is 250  copies / mL. A negative result does not preclude SARS-CoV-2 infection  and should not be used as the sole basis for treatment or other  patient management decisions.  A negative result may occur with  improper specimen collection / handling, submission of specimen other  than nasopharyngeal swab, presence of viral mutation(s) within the  areas targeted by this assay, and inadequate number of viral copies  (<250 copies / mL). A negative result must be combined with clinical  observations, patient history, and epidemiological information. If result is POSITIVE SARS-CoV-2 target nucleic acids are DETECTED. The SARS-CoV-2 RNA is generally detectable in upper and lower  respiratory specimens dur ing the acute phase of infection.  Positive  results are indicative of active infection with SARS-CoV-2.  Clinical  correlation with patient history and other diagnostic information is  necessary to determine patient infection status.  Positive results do  not rule out bacterial infection or co-infection with other viruses. If result is PRESUMPTIVE POSTIVE SARS-CoV-2 nucleic acids MAY BE PRESENT.   A presumptive positive result was obtained on the submitted specimen  and confirmed on repeat testing.  While 2019 novel coronavirus  (SARS-CoV-2) nucleic acids may be present in the submitted sample  additional confirmatory testing may be necessary for epidemiological  and / or clinical management purposes  to  differentiate between  SARS-CoV-2 and other Sarbecovirus currently known to infect humans.  If clinically indicated additional testing with an alternate test  methodology [id number]) is advised. The SARS-CoV-2 RNA is generally  detectable in upper and lower respiratory sp ecimens during the acute  phase of infection. The expected result is Negative. Fact Sheet for Patients:  StrictlyIdeas.no Fact Sheet for Healthcare Providers: BankingDealers.co.za This test is not yet approved or cleared by the [place] FDA and has  been authorized for detection and/or diagnosis of SARS-CoV-2 by FDA under an Emergency Use Authorization (EUA).  This EUA will remain in effect (meaning this test can be used) for the duration of the COVID-19 declaration under Section 564(b)(1) of the Act, 21 U.S.C. section 360bbb-3(b)(1), unless the authorization is terminated or revoked sooner. Performed at [facility], [address] [place], [place] [address]   Urinalysis, Routine w reflex microscopic     Status: None   Collection Time: 12/05/18  4:08 PM  Result Value Ref Range   Color, Urine YELLOW YELLOW   APPearance CLEAR CLEAR   Specific Gravity, Urine 1.017 1.005 - 1.030   pH 7.0 5.0 - 8.0   Glucose, UA NEGATIVE NEGATIVE mg/dL   Hgb urine dipstick NEGATIVE NEGATIVE   Bilirubin Urine NEGATIVE NEGATIVE   Ketones, ur NEGATIVE NEGATIVE mg/dL   Protein, ur NEGATIVE NEGATIVE mg/dL   Nitrite NEGATIVE NEGATIVE   Leukocytes,Ua NEGATIVE NEGATIVE    Comment: Performed at [facility], [address] [place], [place] [address]   Dg Chest Port 1 View  Result Date: 12/05/2018 CLINICAL DATA:  Dizziness and pain.  Cough. EXAM: PORTABLE CHEST 1 VIEW COMPARISON:  None. FINDINGS: There is a probable nipple shadow on the left. There is patchy airspace opacity with several somewhat nodular appearing areas in the right base, likely due to pneumonia. Several 4-5 mm  nodular opacities are also noted in the right mid lung. Lungs elsewhere are clear. Heart size and pulmonary vascular normal. No adenopathy. No bone lesions. IMPRESSION: Apparent pneumonia in portions of the right mid and lower lung zones. Underlying nodular opacities may represent foci of pneumonia. Septic emboli or neoplastic etiology also possible for these opacities. None of these lesions show cavitation. Suspect nipple shadow on the left; repeat study with nipple markers advised to further evaluate. No adenopathy. Followup PA and lateral chest radiographs recommended in 3-4 weeks following trial of antibiotic therapy to ensure resolution and exclude underlying malignancy. Electronically Signed   By: Bretta BangWilliam  Woodruff III M.D.   On: 12/05/2018 16:34    Pending Labs Unresulted Labs (From admission, onward)    Start     Ordered   12/12/18 0500  Creatinine, serum  (enoxaparin (LOVENOX)    CrCl >/= 30 ml/min)  Weekly,   R    Comments: while on enoxaparin therapy    12/05/18 1909   12/05/18 1907  HIV antibody (Routine Testing)  Once,   STAT     12/05/18 1909   12/05/18 1907  CBC  (enoxaparin (LOVENOX)    CrCl >/= 30 ml/min)  Once,   STAT    Comments: Baseline for enoxaparin therapy IF NOT ALREADY DRAWN.  Notify MD if PLT < 100 K.    12/05/18 1909   12/05/18 1907  Creatinine, serum  (enoxaparin (LOVENOX)    CrCl >/= 30 ml/min)  Once,   STAT    Comments: Baseline for enoxaparin therapy IF NOT ALREADY DRAWN.    12/05/18 1909   12/05/18 1516  Lactic acid, plasma  Now then every 2 hours,   STAT     12/05/18 1516   12/05/18 1516  Blood Culture (routine x 2)  BLOOD CULTURE X 2,   STAT     12/05/18 1516          Vitals/Pain Today's Vitals   12/05/18 1545 12/05/18 1600 12/05/18 1630 12/05/18 1658  BP:  99/69 94/69   Pulse:  81 82   Resp:  Marland Kitchen(!)  21 17   Temp: 100.2 F (37.9 C)     TempSrc: Rectal     SpO2:  95% 95%   Weight:    77 kg  Height:    5\' 11"  (1.803 m)  PainSc:         Isolation Precautions Airborne and Contact precautions  Medications Medications  cefTRIAXone (ROCEPHIN) 2 g in sodium chloride 0.9 % 100 mL IVPB (0 g Intravenous Stopped 12/05/18 1607)  azithromycin (ZITHROMAX) 500 mg in sodium chloride 0.9 % 250 mL IVPB (0 mg Intravenous Stopped 12/05/18 1752)  nystatin (MYCOSTATIN) 100000 UNIT/ML suspension 500,000 Units (has no administration in time range)  enoxaparin (LOVENOX) injection 40 mg (has no administration in time range)  0.9 %  sodium chloride infusion (has no administration in time range)  acetaminophen (TYLENOL) tablet 650 mg (has no administration in time range)    Or  acetaminophen (TYLENOL) suppository 650 mg (has no administration in time range)  sodium chloride 0.9 % bolus 30 mL/kg (500 mLs Intravenous New Bag/Given 12/05/18 1548)    Mobility walks Low fall risk   Focused Assessments Pulmonary Assessment Handoff:  Lung sounds:   O2 Device: Room Air        R Recommendations: See Admitting Provider Note  Report given to:   Additional Notes:

## 2018-12-05 NOTE — H&P (Addendum)
History and Physical    DOA: 12/05/2018  PCP: Patient, No Pcp Per  Patient coming from: Home  Chief Complaint: Cough, myalgias  HPI: Jon Love is a 43 y.o. male with no significant past medical history presented to the ED with complaints of productive cough with thick yellow-green phlegm, myalgias associated with dyspnea.  Patient reports feeling chills at home and T-max in the ED at 100.2 F.  Patient was somewhat hypoxic on presentation per ED report with O2 sats in the 90% and requiring 2 L O2.  Currently he is saturating 98% on room air.  He tested negative for COVID and denies any contacts with any known COVID positives.   Lab work-up in the ED revealed sodium 131, normal renal function, WBC 12.8, hemoglobin 13.2 and chest x-ray revealed patchy airspace opacities in right mid and lower lung zones with several somewhat nodular appearing areas in the right base, likely due to pneumonia.Patient denies history of smoking or drug abuse but reports drinking about 5 cans of beer over the weekend.   Review of Systems: As per HPI otherwise 10 point review of systems negative.    Past Medical History:  Diagnosis Date   Known health problems: none     History reviewed. No pertinent surgical history.  Social history:  reports that he has never smoked. He has never used smokeless tobacco. He reports current alcohol use of about 6.0 standard drinks of alcohol per week. He reports previous drug use.   No Known Allergies  Family history: Patient denies any significant family history of heart disease or strokes   Prior to Admission medications   Not on File    Physical Exam: Vitals:   12/05/18 1545 12/05/18 1600 12/05/18 1630 12/05/18 1658  BP:  99/69 94/69   Pulse:  81 82   Resp:  (!) 21 17   Temp: 100.2 F (37.9 C)     TempSrc: Rectal     SpO2:  95% 95%   Weight:    77 kg  Height:    5\' 11"  (1.803 m)    Constitutional: NAD, calm, comfortable Eyes: PERRL,  lids and conjunctivae normal ENMT: Mucous membranes are moist. Posterior pharynx clear of any exudate or lesions.Normal dentition.  Neck: normal, supple, no masses, no thyromegaly Respiratory: Mild upper airway rhonchi, right basal crepitus but no wheezing. Normal respiratory effort. No accessory muscle use.  Cardiovascular: Regular rate and rhythm, no murmurs / rubs / gallops. No extremity edema. 2+ pedal pulses. No carotid bruits.  Abdomen: no tenderness, no masses palpated. No hepatosplenomegaly. Bowel sounds positive.  Musculoskeletal: no clubbing / cyanosis. No joint deformity upper and lower extremities. Good ROM, no contractures. Normal muscle tone.  Neurologic: CN 2-12 grossly intact. Sensation intact, DTR normal. Strength 5/5 in all 4.  Psychiatric: Normal judgment and insight. Alert and oriented x 3. Normal mood.  SKIN/catheters: no rashes, lesions, ulcers. No induration  Labs on Admission: I have personally reviewed following labs and imaging studies  CBC: Recent Labs  Lab 12/05/18 1500  WBC 12.8*  NEUTROABS 8.2*  HGB 13.2  HCT 40.1  MCV 76.4*  PLT 309   Basic Metabolic Panel: Recent Labs  Lab 12/05/18 1500  NA 131*  K 4.5  CL 98  CO2 25  GLUCOSE 101*  BUN 8  CREATININE 0.78  CALCIUM 9.0   GFR: Estimated Creatinine Clearance: 128.1 mL/min (by C-G formula based on SCr of 0.78 mg/dL). Liver Function Tests: Recent Labs  Lab 12/05/18 1500  AST 24  ALT 18  ALKPHOS 96  BILITOT 1.4*  PROT 6.4*  ALBUMIN 3.2*   No results for input(s): LIPASE, AMYLASE in the last 168 hours. No results for input(s): AMMONIA in the last 168 hours. Coagulation Profile: No results for input(s): INR, PROTIME in the last 168 hours. Cardiac Enzymes: No results for input(s): CKTOTAL, CKMB, CKMBINDEX, TROPONINI in the last 168 hours. BNP (last 3 results) No results for input(s): PROBNP in the last 8760 hours. HbA1C: No results for input(s): HGBA1C in the last 72 hours. CBG: No  results for input(s): GLUCAP in the last 168 hours. Lipid Profile: No results for input(s): CHOL, HDL, LDLCALC, TRIG, CHOLHDL, LDLDIRECT in the last 72 hours. Thyroid Function Tests: No results for input(s): TSH, T4TOTAL, FREET4, T3FREE, THYROIDAB in the last 72 hours. Anemia Panel: No results for input(s): VITAMINB12, FOLATE, FERRITIN, TIBC, IRON, RETICCTPCT in the last 72 hours. Urine analysis:    Component Value Date/Time   COLORURINE YELLOW 12/05/2018 [place] 12/05/2018 1608   LABSPEC 1.017 12/05/2018 [place] 7.0 12/05/2018 1608   GLUCOSEU NEGATIVE 12/05/2018 1608   HGBUR NEGATIVE 12/05/2018 1608   BILIRUBINUR NEGATIVE 12/05/2018 1608   KETONESUR NEGATIVE 12/05/2018 1608   PROTEINUR NEGATIVE 12/05/2018 1608   NITRITE NEGATIVE 12/05/2018 1608   LEUKOCYTESUR NEGATIVE 12/05/2018 1608    Radiological Exams on Admission: Personally reviewed  Dg Chest Port 1 View  Result Date: 12/05/2018 CLINICAL DATA:  Dizziness and pain.  Cough. EXAM: PORTABLE CHEST 1 VIEW COMPARISON:  None. FINDINGS: There is a probable nipple shadow on the left. There is patchy airspace opacity with several somewhat nodular appearing areas in the right base, likely due to pneumonia. Several 4-5 mm nodular opacities are also noted in the right mid lung. Lungs elsewhere are clear. Heart size and pulmonary vascular normal. No adenopathy. No bone lesions. IMPRESSION: Apparent pneumonia in portions of the right mid and lower lung zones. Underlying nodular opacities may represent foci of pneumonia. Septic emboli or neoplastic etiology also possible for these opacities. None of these lesions show cavitation. Suspect nipple shadow on the left; repeat study with nipple markers advised to further evaluate. No adenopathy. Followup PA and lateral chest radiographs recommended in 3-4 weeks following trial of antibiotic therapy to ensure resolution and exclude underlying malignancy. Electronically Signed   By:  Lowella Grip III M.D.   On: 12/05/2018 16:34    EKG: Independently reviewed.  Normal sinus rhythm with early repolarization pattern.  QTc 413 ms     Assessment and Plan:   Active Problems:   Pneumonia   Hyponatremia   Alcohol use    1.  Right-sided pneumonia: Labs show mild leukocytosis.  Patient received IV Rocephin and azithromycin in the ED with neb treatments.  He feels better.  Blood cultures have been sent through the ED.  Chest x-ray reported as "Apparent pneumonia in portions of the right mid and lower lung zones. Underlying nodular opacities may represent foci of pneumonia. Septic emboli or neoplastic etiology also possible for these opacities".  Repeat chest x-ray after adequate antibiotic treatment has been recommended.  Alternatively CT chest can be obtained as inpatient if no significant improvement in patient's condition or unable to obtain PCP.  Saturating well on room air currently.  Mucolytic's, antitussives and nebs PRN for posttussive bronchospasm.  2.  Mild hyponatremia: Likely secondary to dehydration in the setting of problem #1.  Will admit with IV fluids and labs in a.m.  3.  Alcohol use: Patient denies any prior history of alcohol withdrawal and insists that he drinks only on weekends.  Multivitamin/thiamine.   DVT prophylaxis: Lovenox  COVID screen: Tested in the ED and negative  Code Status: Full code   Patient/Family Communication: Discussed with patient  Consults called: None Admission status : Patient admitted as observation status given no underlying risk factors, adequate saturation on room air.  Can likely transition to oral antibiotics in a.m.  Will need PCP set up for follow-up and repeat chest x-ray in few weeks upon discharge. Expected LOS: 1 day    Jon BevelsNeelima Sharni Negron MD [facility] Pager [phone]- [phone]  If 7PM-7AM, please contact night-coverage [email or web address] Password [email or web address]  12/05/2018, 8:05 PM

## 2018-12-05 NOTE — ED Provider Notes (Signed)
The patient is a 43 year old male, he presents to the hospital today complaining of some pain in his chest and his back with a flulike illness complaining of muscle aches, subjective fevers, coughing and shortness of breath productive of phlegm.  On exam the patient is thin, he is in no significant distress other than mild tachypnea.  His oxygen level is 90% on room air, he is speaking in full sentences, he has rales focally at the bases right greater than left, slight expiratory wheeze but no accessory muscle use or increased work of breathing.  There is no peripheral edema, he has a borderline tachycardia, his mucous membranes are moist.  I suspect that the patient has pneumonia, whether this is coronavirus or not is questionable thus testing will be initiated, chest x-ray, labs, COVID precautions will be taken.  Code Sepsis activated for likely pna - with low pressure - hypoxia, tactile fever.  Patient agreeable to the plan   EKG Interpretation  Date/Time:  Sunday December 05 2018 15:57:08 EDT Ventricular Rate:  83 PR Interval:    QRS Duration: 82 QT Interval:  351 QTC Calculation: 413 R Axis:   47 Text Interpretation:  Sinus rhythm ST elev, probable normal early repol pattern No old tracing to compare Confirmed by Noemi Chapel [phone]) on 12/05/2018 4:05:06 PM       Medical screening examination/treatment/procedure(s) were conducted as a shared visit with non-physician practitioner(s) and myself.  I personally evaluated the patient during the encounter.  Clinical Impression:   Final diagnoses:  Oral thrush  Community acquired pneumonia of right lower lobe of lung ([place])  Sepsis without acute organ dysfunction, due to unspecified organism ([place])  Hypoxia         Noemi Chapel, MD 12/07/18 [phone]

## 2018-12-05 NOTE — ED Notes (Signed)
1810 ml of NS to be given in addition to the 500 ml already scanned.

## 2018-12-05 NOTE — ED Triage Notes (Signed)
Pt reports bones hurting and dizziness x2 days and flu like for 3 weeks with pain in back and lungs. Denies, fever, NVD.

## 2018-12-06 ENCOUNTER — Observation Stay (HOSPITAL_COMMUNITY): Payer: Self-pay

## 2018-12-06 DIAGNOSIS — J181 Lobar pneumonia, unspecified organism: Secondary | ICD-10-CM

## 2018-12-06 LAB — CBC
HCT: 35.8 % — ABNORMAL LOW (ref 39.0–52.0)
Hemoglobin: 11.9 g/dL — ABNORMAL LOW (ref 13.0–17.0)
MCH: 25.9 pg — ABNORMAL LOW (ref 26.0–34.0)
MCHC: 33.2 g/dL (ref 30.0–36.0)
MCV: 78 fL — ABNORMAL LOW (ref 80.0–100.0)
Platelets: 255 10*3/uL (ref 150–400)
RBC: 4.59 MIL/uL (ref 4.22–5.81)
RDW: 16.9 % — ABNORMAL HIGH (ref 11.5–15.5)
WBC: 8.4 10*3/uL (ref 4.0–10.5)
nRBC: 0 % (ref 0.0–0.2)

## 2018-12-06 LAB — BASIC METABOLIC PANEL
Anion gap: 10 (ref 5–15)
BUN: 6 mg/dL (ref 6–20)
CO2: 22 mmol/L (ref 22–32)
Calcium: 8 mg/dL — ABNORMAL LOW (ref 8.9–10.3)
Chloride: 103 mmol/L (ref 98–111)
Creatinine, Ser: 0.58 mg/dL — ABNORMAL LOW (ref 0.61–1.24)
GFR calc Af Amer: >60 mL/min (ref >60)
GFR calc non Af Amer: >60 mL/min (ref >60)
Glucose, Bld: 95 mg/dL (ref 70–99)
Potassium: 3.9 mmol/L (ref 3.5–5.1)
Sodium: 135 mmol/L (ref 135–145)

## 2018-12-06 IMAGING — CT CT CHEST WITH CONTRAST
2 of 3 series · 15 of 36 positions shown, 18 images · IV contrast (omnipaque)
Comparison: Radiograph from the previous day

CLINICAL DATA: Right side cp, sob, unresolved pna

EXAM:
CT CHEST WITH CONTRAST
TECHNIQUE: Multidetector CT imaging of the chest was performed during
intravenous contrast administration.
CONTRAST:  75mL OMNIPAQUE IOHEXOL 300 MG/ML  SOLN

[Series 3: chest w/o 2mm st · axial · non-contrast · 0.69mm/px · z∈[+1184,+1482]mm · 12 of 175 slices shown, 15 images]
[im 13/175  mediastinal]
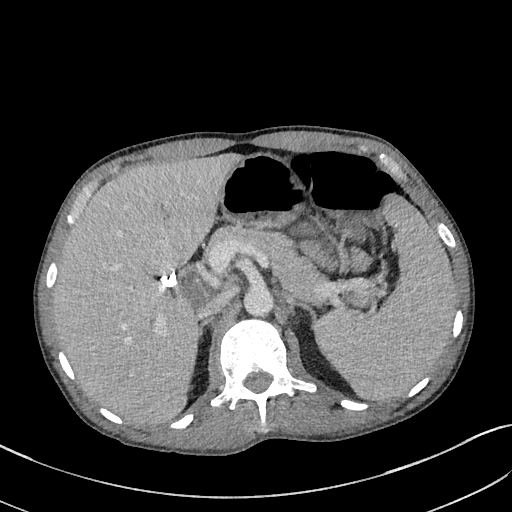
[im 13/175  lung]
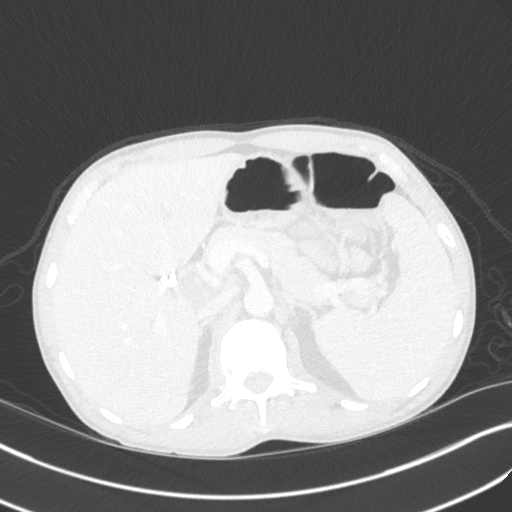
[im 26/175  lung]
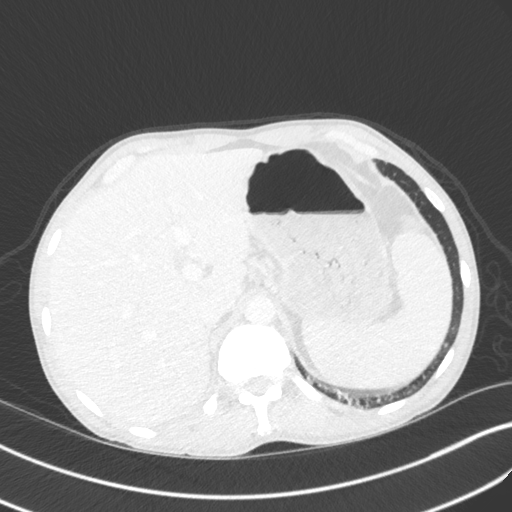
[im 39/175  lung]
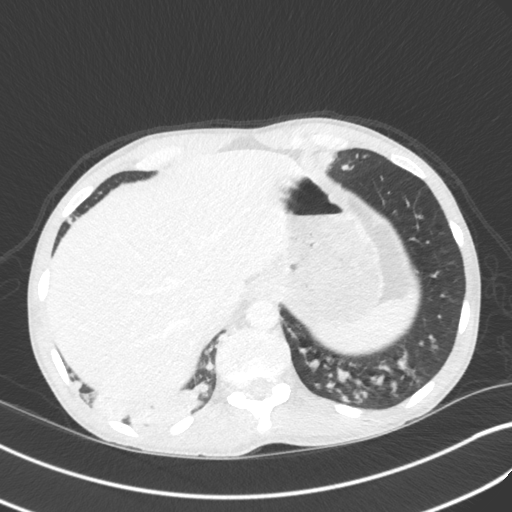
[im 52/175  lung]
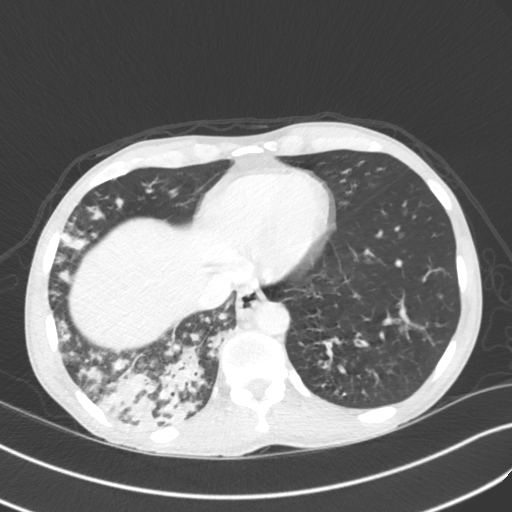
[im 65/175  mediastinal]
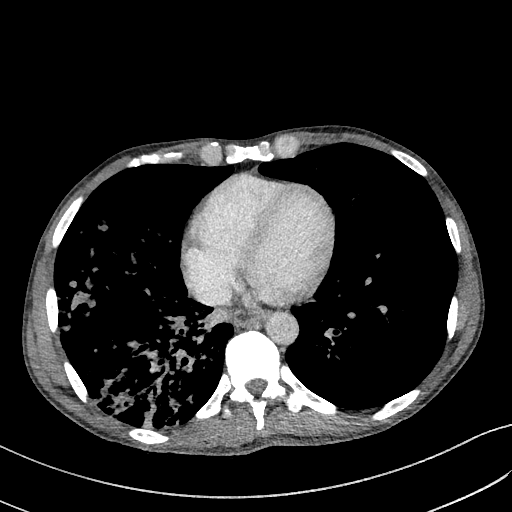
[im 65/175  lung]
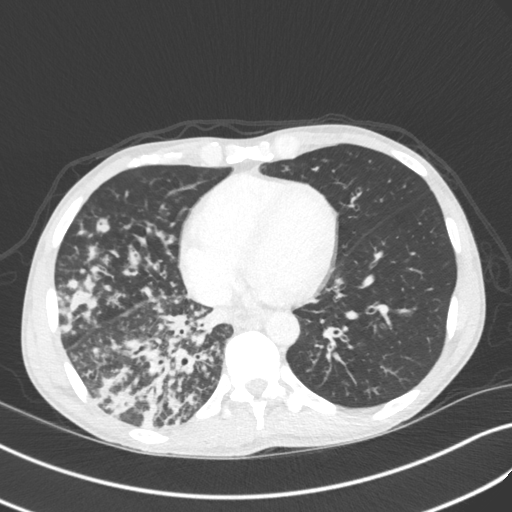
[im 78/175  lung]
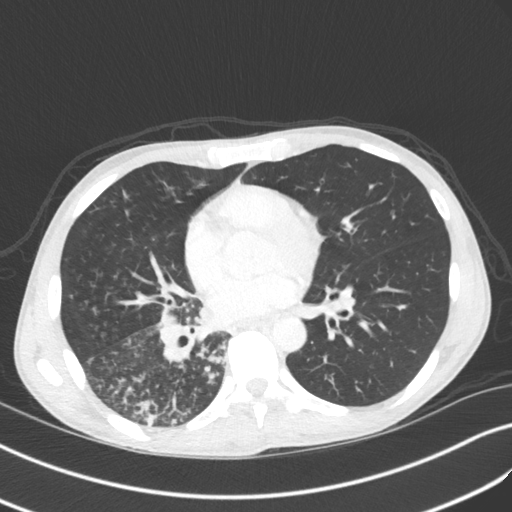
[im 97/175  lung]
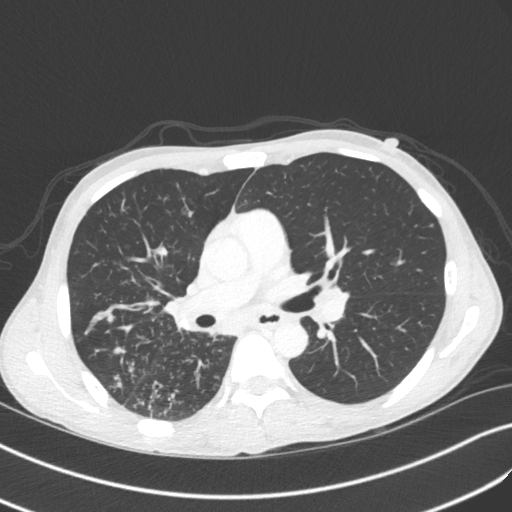
[im 110/175  lung]
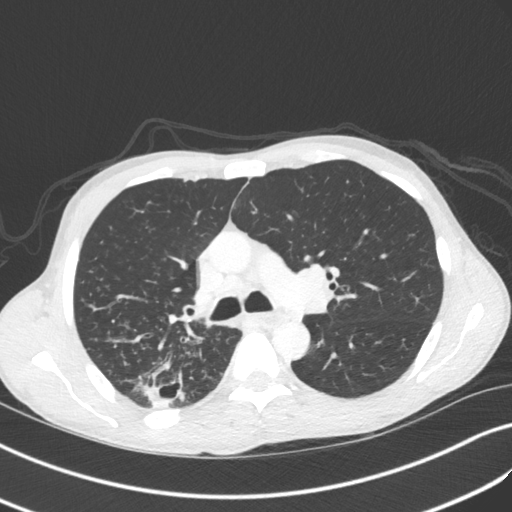
[im 123/175  mediastinal]
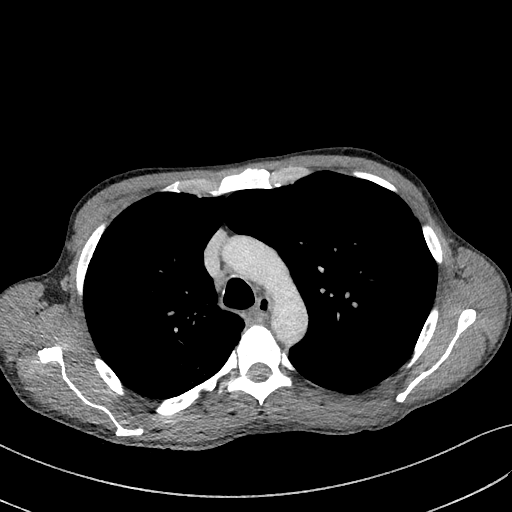
[im 123/175  lung]
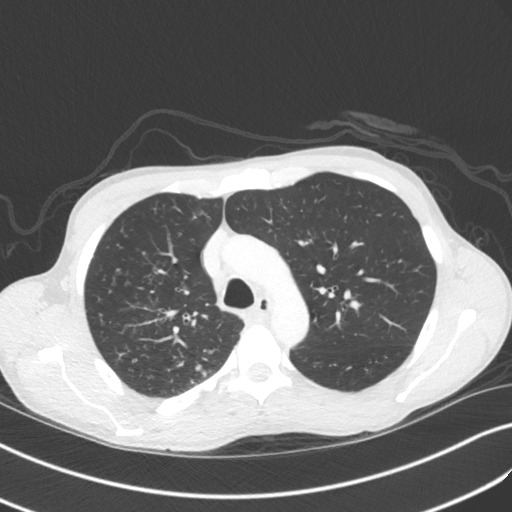
[im 136/175  lung]
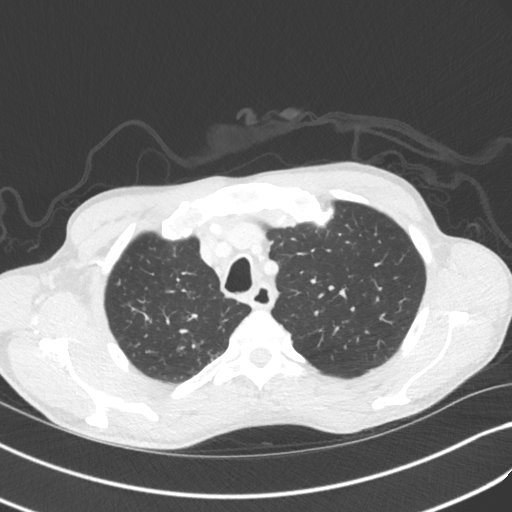
[im 149/175  lung]
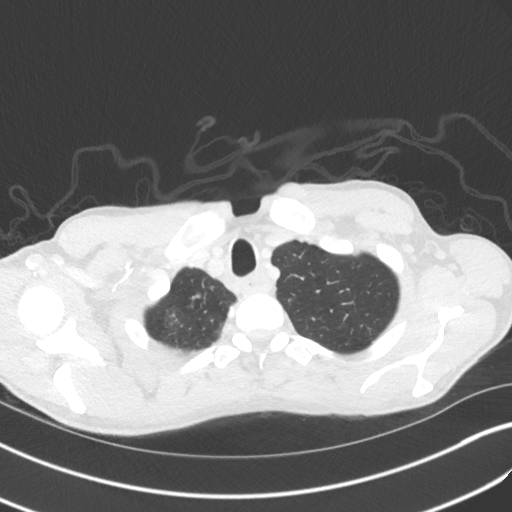
[im 162/175  lung]
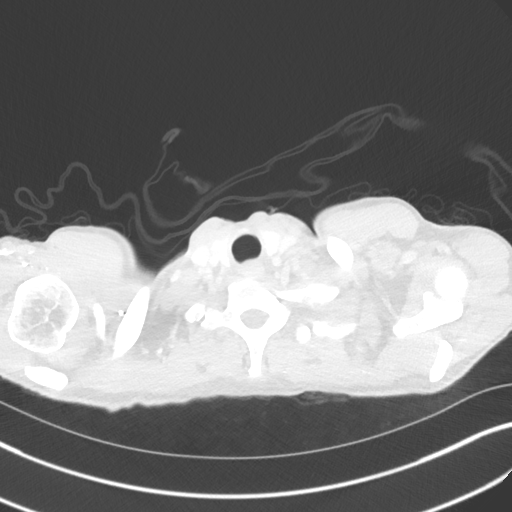

[Series 5: chest w/o 3mm st cor · coronal · non-contrast · 0.65mm/px · 3 of 83 slices shown]
[im 17/83  lung]
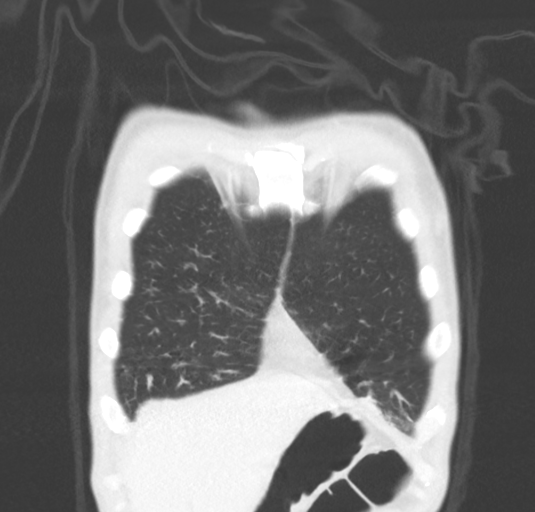
[im 33/83  lung]
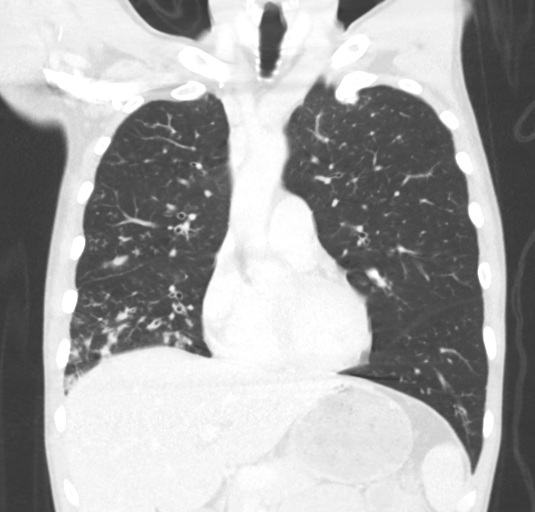
[im 50/83  lung]
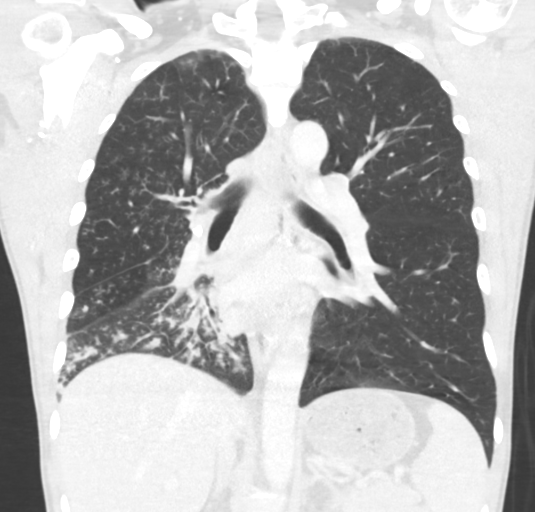

[15 of 36 positions shown; findings below may reference images not displayed]

FINDINGS: Cardiovascular: Heart size normal. No pericardial effusion. Limited
opacification of pulmonary arterial tree. Thoracic aorta normal in
caliber. Minimal calcified plaque in the proximal abdominal aorta.

Mediastinum/Nodes: 1.5 cm subcarinal node. 1.2 cm right hilar node.
Subcentimeter precarinal and AP window lymph nodes. Calcified
bilateral subcentimeter hilar nodes.

Lungs/Pleura: No pleural effusion. Small calcified granulomas in
both lower lobes. No pneumothorax.

Airspace consolidation in posterior basal segment right lower lobe
with multiple scattered airspace opacities throughout the basilar
segments of the right lung, and in the inferior right middle lobe.
2.4 cm subpleural cavitary lesion with eccentric wall thickening in
the posterior right upper lobe and an adjacent 1.6 cm subpleural
cavitary lesion with eccentric wall thickening in the superior
segment right lower lobe. 1 cm focus of nodular pleural based
consolidation in the posterior right upper lobe image 47/4. Small
scattered airspace opacities peripherally in the right upper lobe
and left lower lobe.

Upper Abdomen: Cholecystectomy clips. No acute findings.

Musculoskeletal:
IMPRESSION: 1. Airspace consolidation in the right lower lobe and scattered
airspace opacities bilaterally, right worse than left, suggesting
multifocal pneumonia. At minimum, followup PA and lateral chest
X-ray is recommended in 3-4 weeks following trial of antibiotic
therapy to ensure resolution and exclude underlying malignancy.
2. 2.4 cm cavitary lesion in the posterior right upper lobe with
eccentric wall thickening and adjacent 1.6 cm subpleural cavitary
lesion. Non-contrast chest CT at 3-6 months is recommended. If the
nodules are stable at time of repeat CT, then future CT at 18-24
months (from today's scan) is considered optional for low-risk
patients, but is recommended for high-risk patients. This
recommendation follows the consensus statement: Guidelines for
Management of Incidental Pulmonary Nodules Detected on CT Images:
3. Right hilar and subcarinal adenopathy, possibly reactive but
nonspecific.

## 2018-12-06 MED ORDER — IOHEXOL 300 MG/ML  SOLN
75.0000 mL | Freq: Once | INTRAMUSCULAR | Status: AC | PRN
  Administered 2018-12-06: 75 mL via INTRAVENOUS

## 2018-12-06 MED ORDER — PHENYLEPHRINE HCL-NACL 10-0.9 MG/250ML-% IV SOLN
INTRAVENOUS | Status: AC
  Filled 2018-12-06: qty 250

## 2018-12-06 MED ORDER — SODIUM CHLORIDE 0.9 % IV BOLUS
500.0000 mL | Freq: Once | INTRAVENOUS | Status: AC
  Administered 2018-12-06: 500 mL via INTRAVENOUS

## 2018-12-06 NOTE — Plan of Care (Signed)
  Problem: Education: Goal: Knowledge of General Education information will improve Description Including pain rating scale, medication(s)/side effects and non-pharmacologic comfort measures Outcome: Progressing   Problem: Health Behavior/Discharge Planning: Goal: Ability to manage health-related needs will improve Outcome: Progressing   

## 2018-12-06 NOTE — Consult Note (Signed)
[facility]  Total days of antibiotics 2 ceftriaxone/azithromycin               Reason for Consult:cavitary lung infection   Referring Physician: gherghe  Active Problems:   Pneumonia   Hyponatremia   Alcohol use    HPI: [facility] Jon Love is a 43 y.o. male spanish speaking male originally from [place], in [place] 12yr who in the last 4-6wk started to have non productive cough, nightsweats, and weight loss. 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 He does not recall anyone in his family growing up having mTb. He has not been treated for mTB.admitted 2 days ago, due to fevers and worsening fatigue with cough. On admit, cxr/ct showing multifocal pneumonia with cavitary lesions. covid-19 ruled out. He was started on cap coverage  Past Medical History:  Diagnosis Date   Known health problems: none     Allergies: No Known Allergies   MEDICATIONS:  enoxaparin (LOVENOX) injection  40 mg Subcutaneous Q24H   loratadine  10 mg Oral Daily   nystatin  5 mL Mouth/Throat QID    Social History   Tobacco Use   Smoking status: Never Smoker   Smokeless tobacco: Never Used  Substance Use Topics   Alcohol use: Yes    Alcohol/week: 6.0 standard drinks    Types: 6 Cans of beer per week   Drug use: Not Currently   Family hx: no family hx of tb. Hx of DM  Review of Systems  Constitutional: + for fever, chills, diaphoresis, activity change, appetite change, fatigue and unexpected weight change.  HENT: Negative for congestion, sore throat, rhinorrhea, sneezing, trouble swallowing and sinus pressure.  Eyes: Negative for photophobia and visual disturbance.  Respiratory: + for cough, chest tightness, shortness of breath, wheezing and stridor.  Cardiovascular: Negative for chest pain, palpitations and leg swelling.  Gastrointestinal: Negative for nausea, vomiting, abdominal pain, diarrhea, constipation,  blood in stool, abdominal distention and anal bleeding.  Genitourinary: Negative for dysuria, hematuria, flank pain and difficulty urinating.  Musculoskeletal: Negative for myalgias, back pain, joint swelling, arthralgias and gait problem.  Skin: Negative for color change, pallor, rash and wound.  Neurological: Negative for dizziness, tremors, weakness and light-headedness.  Hematological: Negative for adenopathy. Does not bruise/bleed easily.  Psychiatric/Behavioral: Negative for behavioral problems, confusion, sleep disturbance, dysphoric mood, decreased concentration and agitation.     OBJECTIVE: Temp:  [97.6 F (36.4 C)-99 F (37.2 C)] 99 F (37.2 C) (06/29 1438) Pulse Rate:  [63-83] 78 (06/29 1438) Resp:  [16-18] 18 (06/29 1438) BP: (80-110)/(50-76) 90/63 (06/29 1438) SpO2:  [91 %-100 %] 100 % (06/29 1438) Weight:  [58 kg] 58 kg (06/28 2053) Physical Exam  Constitutional: He is oriented to person, place, and time. He appears well-developed and well-nourished. No distress. Frequent dry cough HENT:  Mouth/Throat: Oropharynx is clear and moist. No oropharyngeal exudate.  Cardiovascular: Normal rate, regular rhythm and normal heart sounds. Exam reveals no gallop and no friction rub.  No murmur heard.  Pulmonary/Chest: Effort normal and breath sounds normal. No respiratory distress. He has no wheezes.  Abdominal: Soft. Bowel sounds are normal. He exhibits no distension. There is no tenderness.  Lymphadenopathy:  He has no cervical adenopathy.  Neurological: He is alert and oriented to person, place, and time.  Skin: Skin is warm and dry. No rash noted. No erythema.  Psychiatric: He has a normal mood and affect. His behavior  is normal.    LABS: Results for orders placed or performed during the hospital encounter of 12/05/18 (from the past 48 hour(s))  Lactic acid, plasma     Status: None   Collection Time: 12/05/18  2:57 PM  Result Value Ref Range   Lactic Acid, Venous 1.4 0.5  - 1.9 mmol/L    Comment: Performed at [facility], [address] [place], [place] [address]  Comprehensive metabolic panel     Status: Abnormal   Collection Time: 12/05/18  3:00 PM  Result Value Ref Range   Sodium 131 (L) 135 - 145 mmol/L   Potassium 4.5 3.5 - 5.1 mmol/L   Chloride 98 98 - 111 mmol/L   CO2 25 22 - 32 mmol/L   Glucose, Bld 101 (H) 70 - 99 mg/dL   BUN 8 6 - 20 mg/dL   Creatinine, Ser 0.78 0.61 - 1.24 mg/dL   Calcium 9.0 8.9 - 10.3 mg/dL   Total Protein 6.4 (L) 6.5 - 8.1 g/dL   Albumin 3.2 (L) 3.5 - 5.0 g/dL   AST 24 15 - 41 U/L   ALT 18 0 - 44 U/L   Alkaline Phosphatase 96 38 - 126 U/L   Total Bilirubin 1.4 (H) 0.3 - 1.2 mg/dL   GFR calc non Af Amer >60 >60 mL/min   GFR calc Af Amer >60 >60 mL/min   Anion gap 8 5 - 15    Comment: Performed at [facility] [address], [place], [place] [address]  CBC with Differential     Status: Abnormal   Collection Time: 12/05/18  3:00 PM  Result Value Ref Range   WBC 12.8 (H) 4.0 - 10.5 K/uL   RBC 5.25 4.22 - 5.81 MIL/uL   Hemoglobin 13.2 13.0 - 17.0 g/dL   HCT 40.1 39.0 - 52.0 %   MCV 76.4 (L) 80.0 - 100.0 fL   MCH 25.1 (L) 26.0 - 34.0 pg   MCHC 32.9 30.0 - 36.0 g/dL   RDW 16.8 (H) 11.5 - 15.5 %   Platelets 309 150 - 400 K/uL   nRBC 0.0 0.0 - 0.2 %   Neutrophils Relative % 63 %   Neutro Abs 8.2 (H) 1.7 - 7.7 K/uL   Lymphocytes Relative 26 %   Lymphs Abs 3.3 0.7 - 4.0 K/uL   Monocytes Relative 8 %   Monocytes Absolute 1.0 0.1 - 1.0 K/uL   Eosinophils Relative 2 %   Eosinophils Absolute 0.2 0.0 - 0.5 K/uL   Basophils Relative 0 %   Basophils Absolute 0.0 0.0 - 0.1 K/uL   Immature Granulocytes 1 %   Abs Immature Granulocytes 0.09 (H) 0.00 - 0.07 K/uL    Comment: Performed at [facility] [address], [place], [place] [address]  Blood Culture (routine x 2)     Status: None (Preliminary result)   Collection Time: 12/05/18  3:30 PM   Specimen: BLOOD  Result Value Ref Range   Specimen  Description BLOOD LEFT ANTECUBITAL    Special Requests      BOTTLES DRAWN AEROBIC AND ANAEROBIC Blood Culture results may not be optimal due to an excessive volume of blood received in culture bottles   Culture      NO GROWTH < 24 HOURS Performed at [facility] [address], [place], [place] [address]    Report Status PENDING   Blood Culture (routine x 2)     Status: None (Preliminary result)  Collection Time: 12/05/18  3:35 PM   Specimen: BLOOD RIGHT ARM  Result Value Ref Range   Specimen Description BLOOD RIGHT ARM    Special Requests      BOTTLES DRAWN AEROBIC AND ANAEROBIC Blood Culture adequate volume   Culture      NO GROWTH < 24 HOURS Performed at [facility], [address] [place], [place] [address]    Report Status PENDING   SARS Coronavirus 2 (CEPHEID- Performed in [facility] hospital lab), Hosp Order     Status: None   Collection Time: 12/05/18  3:59 PM   Specimen: Nasopharyngeal Swab  Result Value Ref Range   SARS Coronavirus 2 NEGATIVE NEGATIVE    Comment: (NOTE) If result is NEGATIVE SARS-CoV-2 target nucleic acids are NOT DETECTED. The SARS-CoV-2 RNA is generally detectable in upper and lower  respiratory specimens during the acute phase of infection. The lowest  concentration of SARS-CoV-2 viral copies this assay can detect is 250  copies / mL. A negative result does not preclude SARS-CoV-2 infection  and should not be used as the sole basis for treatment or other  patient management decisions.  A negative result may occur with  improper specimen collection / handling, submission of specimen other  than nasopharyngeal swab, presence of viral mutation(s) within the  areas targeted by this assay, and inadequate number of viral copies  (<250 copies / mL). A negative result must be combined with clinical  observations, patient history, and epidemiological information. If result is POSITIVE SARS-CoV-2 target nucleic acids are DETECTED. The  SARS-CoV-2 RNA is generally detectable in upper and lower  respiratory specimens dur ing the acute phase of infection.  Positive  results are indicative of active infection with SARS-CoV-2.  Clinical  correlation with patient history and other diagnostic information is  necessary to determine patient infection status.  Positive results do  not rule out bacterial infection or co-infection with other viruses. If result is PRESUMPTIVE POSTIVE SARS-CoV-2 nucleic acids MAY BE PRESENT.   A presumptive positive result was obtained on the submitted specimen  and confirmed on repeat testing.  While 2019 novel coronavirus  (SARS-CoV-2) nucleic acids may be present in the submitted sample  additional confirmatory testing may be necessary for epidemiological  and / or clinical management purposes  to differentiate between  SARS-CoV-2 and other Sarbecovirus currently known to infect humans.  If clinically indicated additional testing with an alternate test  methodology [phone] is advised. The SARS-CoV-2 RNA is generally  detectable in upper and lower respiratory sp ecimens during the acute  phase of infection. The expected result is Negative. Fact Sheet for Patients:  BoilerBrush.com.cyhttps://www.fda.gov/media/136312/download Fact Sheet for Healthcare Providers: https://pope.com/https://www.fda.gov/media/136313/download This test is not yet approved or cleared by the [place] FDA and has been authorized for detection and/or diagnosis of SARS-CoV-2 by FDA under an Emergency Use Authorization (EUA).  This EUA will remain in effect (meaning this test can be used) for the duration of the COVID-19 declaration under Section 564(b)(1) of the Act, 21 U.S.C. section 360bbb-3(b)(1), unless the authorization is terminated or revoked sooner. Performed at [facility], [address] [place], [place] [address]   Urinalysis, Routine w reflex microscopic     Status: None   Collection Time: 12/05/18  4:08 PM  Result Value Ref  Range   Color, Urine YELLOW YELLOW   APPearance CLEAR CLEAR   Specific Gravity, Urine 1.017 1.005 - 1.030   pH 7.0 5.0 - 8.0  Glucose, UA NEGATIVE NEGATIVE mg/dL   Hgb urine dipstick NEGATIVE NEGATIVE   Bilirubin Urine NEGATIVE NEGATIVE   Ketones, ur NEGATIVE NEGATIVE mg/dL   Protein, ur NEGATIVE NEGATIVE mg/dL   Nitrite NEGATIVE NEGATIVE   Leukocytes,Ua NEGATIVE NEGATIVE    Comment: Performed at [facility], [address] [place], [place] [address]  Lactic acid, plasma     Status: None   Collection Time: 12/05/18  9:53 PM  Result Value Ref Range   Lactic Acid, Venous 1.0 0.5 - 1.9 mmol/L    Comment: Performed at [facility], [address] [place], [place] [address]  Basic metabolic panel     Status: Abnormal   Collection Time: 12/06/18  5:46 AM  Result Value Ref Range   Sodium 135 135 - 145 mmol/L   Potassium 3.9 3.5 - 5.1 mmol/L   Chloride 103 98 - 111 mmol/L   CO2 22 22 - 32 mmol/L   Glucose, Bld 95 70 - 99 mg/dL   BUN 6 6 - 20 mg/dL   Creatinine, Ser 0.980.58 (L) 0.61 - 1.24 mg/dL   Calcium 8.0 (L) 8.9 - 10.3 mg/dL   GFR calc non Af Amer >60 >60 mL/min   GFR calc Af Amer >60 >60 mL/min   Anion gap 10 5 - 15    Comment: Performed at [facility], [address] [place], [place] [address]  CBC     Status: Abnormal   Collection Time: 12/06/18  5:46 AM  Result Value Ref Range   WBC 8.4 4.0 - 10.5 K/uL   RBC 4.59 4.22 - 5.81 MIL/uL   Hemoglobin 11.9 (L) 13.0 - 17.0 g/dL   HCT 78.235.8 (L) 95.639.0 - 21.352.0 %   MCV 78.0 (L) 80.0 - 100.0 fL   MCH 25.9 (L) 26.0 - 34.0 pg   MCHC 33.2 30.0 - 36.0 g/dL   RDW 08.616.9 (H) 57.811.5 - 46.915.5 %   Platelets 255 150 - 400 K/uL   nRBC 0.0 0.0 - 0.2 %    Comment: Performed at [facility], [address] [place], [place] [address]    MICRO:  IMAGING: Ct Chest W Contrast  Result Date: 12/06/2018 CLINICAL DATA:  Right side cp, sob, unresolved pna EXAM: CT CHEST WITH CONTRAST TECHNIQUE: Multidetector CT imaging of the  chest was performed during intravenous contrast administration. CONTRAST:  75mL OMNIPAQUE IOHEXOL 300 MG/ML  SOLN COMPARISON:  Radiograph from the previous day FINDINGS: Cardiovascular: Heart size normal. No pericardial effusion. Limited opacification of pulmonary arterial tree. Thoracic aorta normal in caliber. Minimal calcified plaque in the proximal abdominal aorta. Mediastinum/Nodes: 1.5 cm subcarinal node. 1.2 cm right hilar node. Subcentimeter precarinal and AP window lymph nodes. Calcified bilateral subcentimeter hilar nodes. Lungs/Pleura: No pleural effusion. Small calcified granulomas in both lower lobes. No pneumothorax. Airspace consolidation in posterior basal segment right lower lobe with multiple scattered airspace opacities throughout the basilar segments of the right lung, and in the inferior right middle lobe. 2.4 cm subpleural cavitary lesion with eccentric wall thickening in the posterior right upper lobe and an adjacent 1.6 cm subpleural cavitary lesion with eccentric wall thickening in the superior segment right lower lobe. 1 cm focus of nodular pleural based consolidation in the posterior right upper lobe image 47/4. Small scattered airspace opacities peripherally in the right upper lobe and left lower lobe. Upper Abdomen: Cholecystectomy clips. No acute findings. Musculoskeletal: IMPRESSION: 1. Airspace consolidation in the right lower lobe and scattered airspace  opacities bilaterally, right worse than left, suggesting multifocal pneumonia. At minimum, followup PA and lateral chest X-ray is recommended in 3-4 weeks following trial of antibiotic therapy to ensure resolution and exclude underlying malignancy. 2. 2.4 cm cavitary lesion in the posterior right upper lobe with eccentric wall thickening and adjacent 1.6 cm subpleural cavitary lesion. Non-contrast chest CT at 3-6 months is recommended. If the nodules are stable at time of repeat CT, then future CT at 18-24 months (from today's scan)  is considered optional for low-risk patients, but is recommended for high-risk patients. This recommendation follows the consensus statement: Guidelines for Management of Incidental Pulmonary Nodules Detected on CT Images: From the Fleischner Society 2017; Radiology 2017; 284:228-243. 3. Right hilar and subcarinal adenopathy, possibly reactive but nonspecific. Electronically Signed   By: Corlis Leak  Hassell M.D.   On: 12/06/2018 08:30   Dg Chest Port 1 View  Result Date: 12/05/2018 CLINICAL DATA:  Dizziness and pain.  Cough. EXAM: PORTABLE CHEST 1 VIEW COMPARISON:  None. FINDINGS: There is a probable nipple shadow on the left. There is patchy airspace opacity with several somewhat nodular appearing areas in the right base, likely due to pneumonia. Several 4-5 mm nodular opacities are also noted in the right mid lung. Lungs elsewhere are clear. Heart size and pulmonary vascular normal. No adenopathy. No bone lesions. IMPRESSION: Apparent pneumonia in portions of the right mid and lower lung zones. Underlying nodular opacities may represent foci of pneumonia. Septic emboli or neoplastic etiology also possible for these opacities. None of these lesions show cavitation. Suspect nipple shadow on the left; repeat study with nipple markers advised to further evaluate. No adenopathy. Followup PA and lateral chest radiographs recommended in 3-4 weeks following trial of antibiotic therapy to ensure resolution and exclude underlying malignancy. Electronically Signed   By: Bretta BangWilliam  Woodruff III M.D.   On: 12/05/2018 16:34    Assessment/Plan:  42yo multifocal pneumonia with cavitary lesions, and b-symptoms from [place] - recommend to rule out for mTB with 3 AFB sputum collection - keep on negative pressure room - await identification of +AFb before starting RIPE - continue on cap coverage - please check HIV ab  Aram Beechamynthia B. Drue SecondSnider MD MPH [facility] [phone]

## 2018-12-06 NOTE — Plan of Care (Signed)

## 2018-12-06 NOTE — Progress Notes (Signed)
Received pt from 2W.  Marland Kitchen  Pt put to bed, Pt alert and oriented x4, no complaints of pain or discomfort.  Bed in low position, call bell within reach.  Bed alarms on and functioning.  Assessment done and charted.  Will continue to monitor and do hourly rounding throughout the shift

## 2018-12-06 NOTE — Progress Notes (Signed)
PROGRESS NOTE  [facility] OVA:[id number] DOB: Jan 29, 1976 DOA: 12/05/2018 PCP: Patient, No Pcp Per   LOS: 0 days   Brief Narrative / Interim history: 43 year old male without significant medical history came in with productive cough, myalgias, progressively worsening over the last 2 to 3 weeks.  He also reports a cough which has been productive of yellow sputum.  He was hypoxic on admission requiring 2 L nasal cannula.  He tested negative for Covid on admission.  Chest x-ray showed patchy airspace opacity in the right middle and lower lung zones  Subjective: Continues to feel somewhat short of breath this morning and weak.  Has had persistent hypotension overnight.  He denies any fever or chills.  Denies any chest pain, denies any abdominal pain, no nausea or vomiting.  Assessment & Plan: Active Problems:   Pneumonia   Hyponatremia   Alcohol use   Principal Problem Acute hypoxic respiratory failure due to multifocal pneumonia with sepsis -Met criteria for sepsis with elevated white count, tachycardia, tachypnea as well as a source.  Still hypotensive overnight -Patient was hypoxic in the ED, on admission H&P it is mentioned that he was on room air.  He did have persistent hypoxia overnight last night and this morning he is requiring 2 to 3 L nasal cannula.  Has been having intermittent hypotension overnight with blood pressure in the 80s-90s and this appears asymptomatic with weakness -Patient is an ongoing need for hospitalization for IV fluids along with antibiotics given persistent hypoxia and hypotension -Obtain CT scan of the chest given concern for nodular opacities  Active Problems Mild hyponatremia -Resolved with fluids  Alcohol use -Not daily, does not seem to be withdrawing  Scheduled Meds: . enoxaparin (LOVENOX) injection  40 mg Subcutaneous Q24H  . loratadine  10 mg Oral Daily  . nystatin  5 mL Mouth/Throat QID   Continuous Infusions: . sodium chloride  100 mL/hr at 12/06/18 0401  . azithromycin Stopped (12/05/18 1752)  . cefTRIAXone (ROCEPHIN)  IV Stopped (12/05/18 1607)   PRN Meds:.acetaminophen **OR** acetaminophen, guaiFENesin-dextromethorphan, ipratropium-albuterol  DVT prophylaxis: Lovenox Code Status: Full code Family Communication: d/w patient Disposition Plan: home when ready   Consultants:   None   Procedures:   None   Antimicrobials:  Ceftriaxone 6/28 >>  Azithromycin 6/28 >>   Objective: Vitals:   12/06/18 0400 12/06/18 0500 12/06/18 0600 12/06/18 0728  BP: (!) 89/50 (!) 95/52 96/71 108/76  Pulse: 72 71 83 82  Resp: _0 Temp:    97.6 F (36.4 C)  TempSrc:    Oral  SpO2: 98% 100% 94% 100%  Weight:      Height:        Intake/Output Summary (Last 24 hours) at 12/06/2018 1121 Last data filed at 12/06/2018 1000 Gross per 24 hour  Intake 1898.64 ml  Output 700 ml  Net 1198.64 ml   Filed Weights   12/05/18 1658 12/05/18 2053  Weight: 77 kg 58 kg    Examination:  Constitutional: NAD Eyes: PERRL, lids and conjunctivae normal ENMT: Mucous membranes are moist.  Respiratory: Diffuse rhonchi in the right lung field, no wheezing, diminished at the bases.  Clear on the left.  Good air movement. Cardiovascular: Regular rate and rhythm, no murmurs / rubs / gallops. No LE edema. 2+ pedal pulses.  Abdomen: no tenderness. Bowel sounds positive.  Musculoskeletal: no clubbing / cyanosis. Skin: no rashes Neurologic: CN 2-12 grossly intact. Strength 5/5 in all 4.  Psychiatric: Normal judgment and  insight. Alert and oriented x 3. Normal mood.    Data Reviewed: I have independently reviewed following labs and imaging studies   CBC: Recent Labs  Lab 12/05/18 1500 12/06/18 0546  WBC 12.8* 8.4  NEUTROABS 8.2*  --   HGB 13.2 11.9*  HCT 40.1 35.8*  MCV 76.4* 78.0*  PLT 309 622   Basic Metabolic Panel: Recent Labs  Lab 12/05/18 1500 12/06/18 0546  NA 131* 135  K 4.5 3.9  CL 98 103  CO2 25 22   GLUCOSE 101* 95  BUN 8 6  CREATININE 0.78 0.58*  CALCIUM 9.0 8.0*   GFR: Estimated Creatinine Clearance: 98.7 mL/min (A) (by C-G formula based on SCr of 0.58 mg/dL (L)). Liver Function Tests: Recent Labs  Lab 12/05/18 1500  AST 24  ALT 18  ALKPHOS 96  BILITOT 1.4*  PROT 6.4*  ALBUMIN 3.2*   No results for input(s): LIPASE, AMYLASE in the last 168 hours. No results for input(s): AMMONIA in the last 168 hours. Coagulation Profile: No results for input(s): INR, PROTIME in the last 168 hours. Cardiac Enzymes: No results for input(s): CKTOTAL, CKMB, CKMBINDEX, TROPONINI in the last 168 hours. BNP (last 3 results) No results for input(s): PROBNP in the last 8760 hours. HbA1C: No results for input(s): HGBA1C in the last 72 hours. CBG: No results for input(s): GLUCAP in the last 168 hours. Lipid Profile: No results for input(s): CHOL, HDL, LDLCALC, TRIG, CHOLHDL, LDLDIRECT in the last 72 hours. Thyroid Function Tests: No results for input(s): TSH, T4TOTAL, FREET4, T3FREE, THYROIDAB in the last 72 hours. Anemia Panel: No results for input(s): VITAMINB12, FOLATE, FERRITIN, TIBC, IRON, RETICCTPCT in the last 72 hours. Urine analysis:    Component Value Date/Time   COLORURINE YELLOW 12/05/2018 [place] 12/05/2018 1608   LABSPEC 1.017 12/05/2018 [place] 7.0 12/05/2018 1608   GLUCOSEU NEGATIVE 12/05/2018 1608   HGBUR NEGATIVE 12/05/2018 1608   BILIRUBINUR NEGATIVE 12/05/2018 1608   KETONESUR NEGATIVE 12/05/2018 1608   PROTEINUR NEGATIVE 12/05/2018 1608   NITRITE NEGATIVE 12/05/2018 1608   LEUKOCYTESUR NEGATIVE 12/05/2018 1608   Sepsis Labs: Invalid input(s): PROCALCITONIN, LACTICIDVEN  Recent Results (from the past 240 hour(s))  SARS Coronavirus 2 (CEPHEID- Performed in [place] hospital lab), Hosp Order     Status: None   Collection Time: 12/05/18  3:59 PM   Specimen: Nasopharyngeal Swab  Result Value Ref Range Status   SARS Coronavirus 2  NEGATIVE NEGATIVE Final    Comment: (NOTE) If result is NEGATIVE SARS-CoV-2 target nucleic acids are NOT DETECTED. The SARS-CoV-2 RNA is generally detectable in upper and lower  respiratory specimens during the acute phase of infection. The lowest  concentration of SARS-CoV-2 viral copies this assay can detect is 250  copies / mL. A negative result does not preclude SARS-CoV-2 infection  and should not be used as the sole basis for treatment or other  patient management decisions.  A negative result may occur with  improper specimen collection / handling, submission of specimen other  than nasopharyngeal swab, presence of viral mutation(s) within the  areas targeted by this assay, and inadequate number of viral copies  (<250 copies / mL). A negative result must be combined with clinical  observations, patient history, and epidemiological information. If result is POSITIVE SARS-CoV-2 target nucleic acids are DETECTED. The SARS-CoV-2 RNA is generally detectable in upper and lower  respiratory specimens dur ing the acute phase of infection.  Positive  results are indicative  of active infection with SARS-CoV-2.  Clinical  correlation with patient history and other diagnostic information is  necessary to determine patient infection status.  Positive results do  not rule out bacterial infection or co-infection with other viruses. If result is PRESUMPTIVE POSTIVE SARS-CoV-2 nucleic acids MAY BE PRESENT.   A presumptive positive result was obtained on the submitted specimen  and confirmed on repeat testing.  While 2019 novel coronavirus  (SARS-CoV-2) nucleic acids may be present in the submitted sample  additional confirmatory testing may be necessary for epidemiological  and / or clinical management purposes  to differentiate between  SARS-CoV-2 and other Sarbecovirus currently known to infect humans.  If clinically indicated additional testing with an alternate test  methodology [phone])  is advised. The SARS-CoV-2 RNA is generally  detectable in upper and lower respiratory sp ecimens during the acute  phase of infection. The expected result is Negative. Fact Sheet for Patients:  StrictlyIdeas.no Fact Sheet for Healthcare Providers: BankingDealers.co.za This test is not yet approved or cleared by the [place] FDA and has been authorized for detection and/or diagnosis of SARS-CoV-2 by FDA under an Emergency Use Authorization (EUA).  This EUA will remain in effect (meaning this test can be used) for the duration of the COVID-19 declaration under Section 564(b)(1) of the Act, 21 U.S.C. section 360bbb-3(b)(1), unless the authorization is terminated or revoked sooner. Performed at [facility], [place] [address] [place], [place] [address]       Radiology Studies: Ct Chest W Contrast  Result Date: 12/06/2018 CLINICAL DATA:  Right side cp, sob, unresolved pna EXAM: CT CHEST WITH CONTRAST TECHNIQUE: Multidetector CT imaging of the chest was performed during intravenous contrast administration. CONTRAST:  71m OMNIPAQUE IOHEXOL 300 MG/ML  SOLN COMPARISON:  Radiograph from the previous day FINDINGS: Cardiovascular: Heart size normal. No pericardial effusion. Limited opacification of pulmonary arterial tree. Thoracic aorta normal in caliber. Minimal calcified plaque in the proximal abdominal aorta. Mediastinum/Nodes: 1.5 cm subcarinal node. 1.2 cm right hilar node. Subcentimeter precarinal and AP window lymph nodes. Calcified bilateral subcentimeter hilar nodes. Lungs/Pleura: No pleural effusion. Small calcified granulomas in both lower lobes. No pneumothorax. Airspace consolidation in posterior basal segment right lower lobe with multiple scattered airspace opacities throughout the basilar segments of the right lung, and in the inferior right middle lobe. 2.4 cm subpleural cavitary lesion with eccentric wall thickening in the  posterior right upper lobe and an adjacent 1.6 cm subpleural cavitary lesion with eccentric wall thickening in the superior segment right lower lobe. 1 cm focus of nodular pleural based consolidation in the posterior right upper lobe image 47/4. Small scattered airspace opacities peripherally in the right upper lobe and left lower lobe. Upper Abdomen: Cholecystectomy clips. No acute findings. Musculoskeletal: IMPRESSION: 1. Airspace consolidation in the right lower lobe and scattered airspace opacities bilaterally, right worse than left, suggesting multifocal pneumonia. At minimum, followup PA and lateral chest X-ray is recommended in 3-4 weeks following trial of antibiotic therapy to ensure resolution and exclude underlying malignancy. 2. 2.4 cm cavitary lesion in the posterior right upper lobe with eccentric wall thickening and adjacent 1.6 cm subpleural cavitary lesion. Non-contrast chest CT at 3-6 months is recommended. If the nodules are stable at time of repeat CT, then future CT at 18-24 months (from today's scan) is considered optional for low-risk patients, but is recommended for high-risk patients. This recommendation follows the consensus statement: Guidelines for Management of Incidental Pulmonary Nodules Detected on CT Images: From the Fleischner  Society 2017; Radiology 2017; 284:228-243. 3. Right hilar and subcarinal adenopathy, possibly reactive but nonspecific. Electronically Signed   By: Lucrezia Europe M.D.   On: 12/06/2018 08:30   Dg Chest Port 1 View  Result Date: 12/05/2018 CLINICAL DATA:  Dizziness and pain.  Cough. EXAM: PORTABLE CHEST 1 VIEW COMPARISON:  None. FINDINGS: There is a probable nipple shadow on the left. There is patchy airspace opacity with several somewhat nodular appearing areas in the right base, likely due to pneumonia. Several 4-5 mm nodular opacities are also noted in the right mid lung. Lungs elsewhere are clear. Heart size and pulmonary vascular normal. No adenopathy. No  bone lesions. IMPRESSION: Apparent pneumonia in portions of the right mid and lower lung zones. Underlying nodular opacities may represent foci of pneumonia. Septic emboli or neoplastic etiology also possible for these opacities. None of these lesions show cavitation. Suspect nipple shadow on the left; repeat study with nipple markers advised to further evaluate. No adenopathy. Followup PA and lateral chest radiographs recommended in 3-4 weeks following trial of antibiotic therapy to ensure resolution and exclude underlying malignancy. Electronically Signed   By: Lowella Grip III M.D.   On: 12/05/2018 16:34   Time spent: 35 minutes, in 2 separate visits, more than 50% at bedside discussing tests/diagnosis and answering all patient's questions  Marzetta Board, MD, PhD [facility]  Contact via  [email or web address]  [place] P: [phone] F: [phone]

## 2018-12-07 DIAGNOSIS — R0902 Hypoxemia: Secondary | ICD-10-CM

## 2018-12-07 DIAGNOSIS — A419 Sepsis, unspecified organism: Principal | ICD-10-CM

## 2018-12-07 DIAGNOSIS — J189 Pneumonia, unspecified organism: Secondary | ICD-10-CM | POA: Insufficient documentation

## 2018-12-07 LAB — HIV 1/2 AB DIFFERENTIATION
HIV 1 Ab: POSITIVE — AB
HIV 2 Ab: NEGATIVE

## 2018-12-07 LAB — HIV ANTIBODY (ROUTINE TESTING W REFLEX): HIV Screen 4th Generation wRfx: REACTIVE — AB

## 2018-12-07 NOTE — Progress Notes (Signed)
PROGRESS NOTE  [facility] MBT:[id number] DOB: 1975-12-25 DOA: 12/05/2018 PCP: Patient, No Pcp Per   LOS: 1 day   Brief Narrative / Interim history: 43 year old male without significant medical history came in with productive cough, myalgias, progressively worsening over the last 2 to 3 weeks.  He also reports a cough which has been productive of yellow sputum.  He was hypoxic on admission requiring 2 L nasal cannula.  He tested negative for Covid on admission.  Chest x-ray showed patchy airspace opacity in the right middle and lower lung zones  Subjective: Feeling better, improved today.  States that he is not coughing as much.  Denies any chest pain, no abdominal pain, no nausea or vomiting.  [employer] Spanish interpreter used  Assessment & Plan: Active Problems:   Pneumonia   Hyponatremia   Alcohol use   Principal Problem Acute hypoxic respiratory failure due to multifocal pneumonia with sepsis -Met criteria for sepsis with elevated white count, tachycardia, tachypnea as well as a source.   -Hypoxia seems to be improving, he is satting upper 90s on 2 L oxygen, wean off to room air as tolerated -CT scan of the chest obtained yesterday showed complete cavitary lesions in the right upper lobe.  This, along with patient's reported history of respiratory symptoms for the past 4 to 5 weeks, night sweats, weight loss, cough, and coming from an endemic area will need to rule out TB.  He was transferred to a negative pressure room on airborne precautions, infectious disease was consulted and obtained sputum AFB x3.  1 cm today, 2 more pending  Active Problems Mild hyponatremia -Resolved with fluids  Alcohol use -Does not drink daily, no withdrawal symptoms noted  Scheduled Meds: . enoxaparin (LOVENOX) injection  40 mg Subcutaneous Q24H  . loratadine  10 mg Oral Daily  . nystatin  5 mL Mouth/Throat QID   Continuous Infusions: . sodium chloride 100 mL/hr at 12/07/18 0958  .  azithromycin 500 mg (12/06/18 1713)  . cefTRIAXone (ROCEPHIN)  IV 2 g (12/06/18 1712)   PRN Meds:.acetaminophen **OR** acetaminophen, guaiFENesin-dextromethorphan, ipratropium-albuterol  DVT prophylaxis: Lovenox Code Status: Full code Family Communication: d/w patient Disposition Plan: home when ready   Consultants:   Infectious disease  Procedures:   None   Antimicrobials:  Ceftriaxone 6/28 >>  Azithromycin 6/28 >>   Objective: Vitals:   12/06/18 1438 12/06/18 1612 12/06/18 2006 12/07/18 0532  BP: 90/63  101/73 102/63  Pulse: 78  65 61  Resp: 18  18 20   Temp: 99 F (37.2 C)  98.2 F (36.8 C) 98.4 F (36.9 C)  TempSrc: Oral  Oral Oral  SpO2: 100%  100% 94%  Weight:      Height:  5' 11"  (1.803 m)     No intake or output data in the 24 hours ending 12/07/18 1153 Filed Weights   12/05/18 1658 12/05/18 2053  Weight: 77 kg 58 kg    Examination:  Constitutional: No distress, pleasant Eyes: No scleral icterus ENMT: Mucous membranes are moist.  Respiratory: Diffuse rhonchi in the right lung field, no wheezing, diminished at the bases.  Clear left lung field.  No wheezing Cardiovascular: Regular rate and rhythm, no murmurs appreciated.  No peripheral edema Abdomen: Soft, nontender, nondistended, positive bowel sounds Musculoskeletal: no clubbing / cyanosis. Skin: No rashes seen Neurologic: No focal deficits, equal strength Psychiatric: Alert and oriented x3   Data Reviewed: I have independently reviewed following labs and imaging studies   CBC: Recent Labs  Lab 12/05/18 1500 12/06/18 0546  WBC 12.8* 8.4  NEUTROABS 8.2*  --   HGB 13.2 11.9*  HCT 40.1 35.8*  MCV 76.4* 78.0*  PLT 309 562   Basic Metabolic Panel: Recent Labs  Lab 12/05/18 1500 12/06/18 0546  NA 131* 135  K 4.5 3.9  CL 98 103  CO2 25 22  GLUCOSE 101* 95  BUN 8 6  CREATININE 0.78 0.58*  CALCIUM 9.0 8.0*   GFR: Estimated Creatinine Clearance: 98.7 mL/min (A) (by C-G formula  based on SCr of 0.58 mg/dL (L)). Liver Function Tests: Recent Labs  Lab 12/05/18 1500  AST 24  ALT 18  ALKPHOS 96  BILITOT 1.4*  PROT 6.4*  ALBUMIN 3.2*   No results for input(s): LIPASE, AMYLASE in the last 168 hours. No results for input(s): AMMONIA in the last 168 hours. Coagulation Profile: No results for input(s): INR, PROTIME in the last 168 hours. Cardiac Enzymes: No results for input(s): CKTOTAL, CKMB, CKMBINDEX, TROPONINI in the last 168 hours. BNP (last 3 results) No results for input(s): PROBNP in the last 8760 hours. HbA1C: No results for input(s): HGBA1C in the last 72 hours. CBG: No results for input(s): GLUCAP in the last 168 hours. Lipid Profile: No results for input(s): CHOL, HDL, LDLCALC, TRIG, CHOLHDL, LDLDIRECT in the last 72 hours. Thyroid Function Tests: No results for input(s): TSH, T4TOTAL, FREET4, T3FREE, THYROIDAB in the last 72 hours. Anemia Panel: No results for input(s): VITAMINB12, FOLATE, FERRITIN, TIBC, IRON, RETICCTPCT in the last 72 hours. Urine analysis:    Component Value Date/Time   COLORURINE YELLOW 12/05/2018 [place] 12/05/2018 1608   LABSPEC 1.017 12/05/2018 [place] 7.0 12/05/2018 1608   GLUCOSEU NEGATIVE 12/05/2018 1608   HGBUR NEGATIVE 12/05/2018 1608   BILIRUBINUR NEGATIVE 12/05/2018 1608   KETONESUR NEGATIVE 12/05/2018 1608   PROTEINUR NEGATIVE 12/05/2018 1608   NITRITE NEGATIVE 12/05/2018 1608   LEUKOCYTESUR NEGATIVE 12/05/2018 1608   Sepsis Labs: Invalid input(s): PROCALCITONIN, LACTICIDVEN  Recent Results (from the past 240 hour(s))  Blood Culture (routine x 2)     Status: None (Preliminary result)   Collection Time: 12/05/18  3:30 PM   Specimen: BLOOD  Result Value Ref Range Status   Specimen Description BLOOD LEFT ANTECUBITAL  Final   Special Requests   Final    BOTTLES DRAWN AEROBIC AND ANAEROBIC Blood Culture results may not be optimal due to an excessive volume of blood received in  culture bottles   Culture   Final    NO GROWTH 2 DAYS Performed at [facility] [address] [place], [place] [address]    Report Status PENDING  Incomplete  Blood Culture (routine x 2)     Status: None (Preliminary result)   Collection Time: 12/05/18  3:35 PM   Specimen: BLOOD RIGHT ARM  Result Value Ref Range Status   Specimen Description BLOOD RIGHT ARM  Final   Special Requests   Final    BOTTLES DRAWN AEROBIC AND ANAEROBIC Blood Culture adequate volume   Culture   Final    NO GROWTH 2 DAYS Performed at [facility], [place] [address] [place], [place] [address]    Report Status PENDING  Incomplete  SARS Coronavirus 2 (CEPHEID- Performed in [place] hospital lab), Hosp Order     Status: None   Collection Time: 12/05/18  3:59 PM   Specimen: Nasopharyngeal Swab  Result Value Ref Range Status   SARS Coronavirus 2 NEGATIVE  NEGATIVE Final    Comment: (NOTE) If result is NEGATIVE SARS-CoV-2 target nucleic acids are NOT DETECTED. The SARS-CoV-2 RNA is generally detectable in upper and lower  respiratory specimens during the acute phase of infection. The lowest  concentration of SARS-CoV-2 viral copies this assay can detect is 250  copies / mL. A negative result does not preclude SARS-CoV-2 infection  and should not be used as the sole basis for treatment or other  patient management decisions.  A negative result may occur with  improper specimen collection / handling, submission of specimen other  than nasopharyngeal swab, presence of viral mutation(s) within the  areas targeted by this assay, and inadequate number of viral copies  (<250 copies / mL). A negative result must be combined with clinical  observations, patient history, and epidemiological information. If result is POSITIVE SARS-CoV-2 target nucleic acids are DETECTED. The SARS-CoV-2 RNA is generally detectable in upper and lower  respiratory specimens dur ing the acute phase of infection.  Positive   results are indicative of active infection with SARS-CoV-2.  Clinical  correlation with patient history and other diagnostic information is  necessary to determine patient infection status.  Positive results do  not rule out bacterial infection or co-infection with other viruses. If result is PRESUMPTIVE POSTIVE SARS-CoV-2 nucleic acids MAY BE PRESENT.   A presumptive positive result was obtained on the submitted specimen  and confirmed on repeat testing.  While 2019 novel coronavirus  (SARS-CoV-2) nucleic acids may be present in the submitted sample  additional confirmatory testing may be necessary for epidemiological  and / or clinical management purposes  to differentiate between  SARS-CoV-2 and other Sarbecovirus currently known to infect humans.  If clinically indicated additional testing with an alternate test  methodology [phone]) is advised. The SARS-CoV-2 RNA is generally  detectable in upper and lower respiratory sp ecimens during the acute  phase of infection. The expected result is Negative. Fact Sheet for Patients:  StrictlyIdeas.no Fact Sheet for Healthcare Providers: BankingDealers.co.za This test is not yet approved or cleared by the [place] FDA and has been authorized for detection and/or diagnosis of SARS-CoV-2 by FDA under an Emergency Use Authorization (EUA).  This EUA will remain in effect (meaning this test can be used) for the duration of the COVID-19 declaration under Section 564(b)(1) of the Act, 21 U.S.C. section 360bbb-3(b)(1), unless the authorization is terminated or revoked sooner. Performed at [facility], [place] [address], [place], [place] [address]       Radiology Studies: Ct Chest W Contrast  Result Date: 12/06/2018 CLINICAL DATA:  Right side cp, sob, unresolved pna EXAM: CT CHEST WITH CONTRAST TECHNIQUE: Multidetector CT imaging of the chest was performed during intravenous contrast  administration. CONTRAST:  22m OMNIPAQUE IOHEXOL 300 MG/ML  SOLN COMPARISON:  Radiograph from the previous day FINDINGS: Cardiovascular: Heart size normal. No pericardial effusion. Limited opacification of pulmonary arterial tree. Thoracic aorta normal in caliber. Minimal calcified plaque in the proximal abdominal aorta. Mediastinum/Nodes: 1.5 cm subcarinal node. 1.2 cm right hilar node. Subcentimeter precarinal and AP window lymph nodes. Calcified bilateral subcentimeter hilar nodes. Lungs/Pleura: No pleural effusion. Small calcified granulomas in both lower lobes. No pneumothorax. Airspace consolidation in posterior basal segment right lower lobe with multiple scattered airspace opacities throughout the basilar segments of the right lung, and in the inferior right middle lobe. 2.4 cm subpleural cavitary lesion with eccentric wall thickening in the posterior right upper lobe and an adjacent 1.6 cm subpleural cavitary lesion  with eccentric wall thickening in the superior segment right lower lobe. 1 cm focus of nodular pleural based consolidation in the posterior right upper lobe image 47/4. Small scattered airspace opacities peripherally in the right upper lobe and left lower lobe. Upper Abdomen: Cholecystectomy clips. No acute findings. Musculoskeletal: IMPRESSION: 1. Airspace consolidation in the right lower lobe and scattered airspace opacities bilaterally, right worse than left, suggesting multifocal pneumonia. At minimum, followup PA and lateral chest X-ray is recommended in 3-4 weeks following trial of antibiotic therapy to ensure resolution and exclude underlying malignancy. 2. 2.4 cm cavitary lesion in the posterior right upper lobe with eccentric wall thickening and adjacent 1.6 cm subpleural cavitary lesion. Non-contrast chest CT at 3-6 months is recommended. If the nodules are stable at time of repeat CT, then future CT at 18-24 months (from today's scan) is considered optional for low-risk patients, but  is recommended for high-risk patients. This recommendation follows the consensus statement: Guidelines for Management of Incidental Pulmonary Nodules Detected on CT Images: From the Fleischner Society 2017; Radiology 2017; 284:228-243. 3. Right hilar and subcarinal adenopathy, possibly reactive but nonspecific. Electronically Signed   By: Lucrezia Europe M.D.   On: 12/06/2018 08:30   Dg Chest Port 1 View  Result Date: 12/05/2018 CLINICAL DATA:  Dizziness and pain.  Cough. EXAM: PORTABLE CHEST 1 VIEW COMPARISON:  None. FINDINGS: There is a probable nipple shadow on the left. There is patchy airspace opacity with several somewhat nodular appearing areas in the right base, likely due to pneumonia. Several 4-5 mm nodular opacities are also noted in the right mid lung. Lungs elsewhere are clear. Heart size and pulmonary vascular normal. No adenopathy. No bone lesions. IMPRESSION: Apparent pneumonia in portions of the right mid and lower lung zones. Underlying nodular opacities may represent foci of pneumonia. Septic emboli or neoplastic etiology also possible for these opacities. None of these lesions show cavitation. Suspect nipple shadow on the left; repeat study with nipple markers advised to further evaluate. No adenopathy. Followup PA and lateral chest radiographs recommended in 3-4 weeks following trial of antibiotic therapy to ensure resolution and exclude underlying malignancy. Electronically Signed   By: Lowella Grip III M.D.   On: 12/05/2018 16:34    Marzetta Board, MD, PhD [facility]  Contact via  [email or web address]  [place] P: [phone] F: [phone]

## 2018-12-07 NOTE — Plan of Care (Signed)
  Problem: Education: Goal: Knowledge of General Education information will improve Description Including pain rating scale, medication(s)/side effects and non-pharmacologic comfort measures Outcome: Progressing   

## 2018-12-07 NOTE — Plan of Care (Signed)
  Problem: Health Behavior/Discharge Planning: Goal: Ability to manage health-related needs will improve Outcome: Progressing   

## 2018-12-07 NOTE — Progress Notes (Signed)
[place] for Infectious Disease    Date of Admission:  12/05/2018   Total days of antibiotics 3 ctx/azithro           ID: [facility] Jon Love is a 43 y.o. male with  Active Problems:   Pneumonia   Hyponatremia   Alcohol use    Subjective: Feeling better, less cough. afebrile  Medications:   enoxaparin (LOVENOX) injection  40 mg Subcutaneous Q24H   loratadine  10 mg Oral Daily   nystatin  5 mL Mouth/Throat QID    Objective: Vital signs in last 24 hours: Temp:  [98.2 F (36.8 C)-98.4 F (36.9 C)] 98.4 F (36.9 C) (06/30 1318) Pulse Rate:  [61-65] 63 (06/30 1318) Resp:  [14-20] 14 (06/30 1318) BP: (94-102)/(61-73) 94/61 (06/30 1318) SpO2:  [94 %-100 %] 98 % (06/30 1318) Physical Exam  Constitutional: He is oriented to person, place, and time. He appears well-developed and well-nourished. No distress.  HENT:  Mouth/Throat: Oropharynx is clear and moist. No oropharyngeal exudate.  Cardiovascular: Normal rate, regular rhythm and normal heart sounds. Exam reveals no gallop and no friction rub.  No murmur heard.  Pulmonary/Chest: Effort normal and breath sounds normal. No respiratory distress. He has no wheezes.  Abdominal: Soft. Bowel sounds are normal. He exhibits no distension. There is no tenderness.  Lymphadenopathy:  He has no cervical adenopathy.  Neurological: He is alert and oriented to person, place, and time.  Skin: Skin is warm and dry. No rash noted. No erythema.  Psychiatric: He has a normal mood and affect. His behavior is normal.     Lab Results Recent Labs    12/05/18 1500 12/06/18 0546  WBC 12.8* 8.4  HGB 13.2 11.9*  HCT 40.1 35.8*  NA 131* 135  K 4.5 3.9  CL 98 103  CO2 25 22  BUN 8 6  CREATININE 0.78 0.58*   Liver Panel Recent Labs    12/05/18 1500  PROT 6.4*  ALBUMIN 3.2*  AST 24  ALT 18  ALKPHOS 96  BILITOT 1.4*    Microbiology: AFB smear and culture pending Studies/Results: Ct Chest W Contrast  Result  Date: 12/06/2018 CLINICAL DATA:  Right side cp, sob, unresolved pna EXAM: CT CHEST WITH CONTRAST TECHNIQUE: Multidetector CT imaging of the chest was performed during intravenous contrast administration. CONTRAST:  20mL OMNIPAQUE IOHEXOL 300 MG/ML  SOLN COMPARISON:  Radiograph from the previous day FINDINGS: Cardiovascular: Heart size normal. No pericardial effusion. Limited opacification of pulmonary arterial tree. Thoracic aorta normal in caliber. Minimal calcified plaque in the proximal abdominal aorta. Mediastinum/Nodes: 1.5 cm subcarinal node. 1.2 cm right hilar node. Subcentimeter precarinal and AP window lymph nodes. Calcified bilateral subcentimeter hilar nodes. Lungs/Pleura: No pleural effusion. Small calcified granulomas in both lower lobes. No pneumothorax. Airspace consolidation in posterior basal segment right lower lobe with multiple scattered airspace opacities throughout the basilar segments of the right lung, and in the inferior right middle lobe. 2.4 cm subpleural cavitary lesion with eccentric wall thickening in the posterior right upper lobe and an adjacent 1.6 cm subpleural cavitary lesion with eccentric wall thickening in the superior segment right lower lobe. 1 cm focus of nodular pleural based consolidation in the posterior right upper lobe image 47/4. Small scattered airspace opacities peripherally in the right upper lobe and left lower lobe. Upper Abdomen: Cholecystectomy clips. No acute findings. Musculoskeletal: IMPRESSION: 1. Airspace consolidation in the right lower lobe and scattered airspace opacities bilaterally, right worse than left, suggesting multifocal pneumonia. At  minimum, followup PA and lateral chest X-ray is recommended in 3-4 weeks following trial of antibiotic therapy to ensure resolution and exclude underlying malignancy. 2. 2.4 cm cavitary lesion in the posterior right upper lobe with eccentric wall thickening and adjacent 1.6 cm subpleural cavitary lesion. Non-contrast  chest CT at 3-6 months is recommended. If the nodules are stable at time of repeat CT, then future CT at 18-24 months (from today's scan) is considered optional for low-risk patients, but is recommended for high-risk patients. This recommendation follows the consensus statement: Guidelines for Management of Incidental Pulmonary Nodules Detected on CT Images: From the Fleischner Society 2017; Radiology 2017; 284:228-243. 3. Right hilar and subcarinal adenopathy, possibly reactive but nonspecific. Electronically Signed   By: Corlis Leak  Hassell M.D.   On: 12/06/2018 08:30     Assessment/Plan: Cavitary pneumonia = continue on ceftriaxone plus azithromycin. Awaiting to see if smear positive or QTF returns. If QTF positive, and smear pending, can start RIPE with VitbB6 with plan to treat for 8wk until afb culture return. At that point, can decide if need to stop or continue for TB treatment.  Dr Orvan Falconercampbell to see tomorrow  [facility] Cell: [phone] Pager: [phone]  12/07/2018, 5:33 PM

## 2018-12-08 DIAGNOSIS — J984 Other disorders of lung: Secondary | ICD-10-CM

## 2018-12-08 DIAGNOSIS — B20 Human immunodeficiency virus [HIV] disease: Secondary | ICD-10-CM | POA: Diagnosis present

## 2018-12-08 DIAGNOSIS — R0902 Hypoxemia: Secondary | ICD-10-CM

## 2018-12-08 DIAGNOSIS — R0989 Other specified symptoms and signs involving the circulatory and respiratory systems: Secondary | ICD-10-CM

## 2018-12-08 DIAGNOSIS — D509 Iron deficiency anemia, unspecified: Secondary | ICD-10-CM | POA: Diagnosis present

## 2018-12-08 DIAGNOSIS — J189 Pneumonia, unspecified organism: Secondary | ICD-10-CM

## 2018-12-08 LAB — BASIC METABOLIC PANEL
Anion gap: 8 (ref 5–15)
BUN: 6 mg/dL (ref 6–20)
CO2: 25 mmol/L (ref 22–32)
Calcium: 8.7 mg/dL — ABNORMAL LOW (ref 8.9–10.3)
Chloride: 103 mmol/L (ref 98–111)
Creatinine, Ser: 0.64 mg/dL (ref 0.61–1.24)
GFR calc Af Amer: >60 mL/min (ref >60)
GFR calc non Af Amer: >60 mL/min (ref >60)
Glucose, Bld: 104 mg/dL — ABNORMAL HIGH (ref 70–99)
Potassium: 4.7 mmol/L (ref 3.5–5.1)
Sodium: 136 mmol/L (ref 135–145)

## 2018-12-08 LAB — CBC
HCT: 35.6 % — ABNORMAL LOW (ref 39.0–52.0)
Hemoglobin: 11.4 g/dL — ABNORMAL LOW (ref 13.0–17.0)
MCH: 24.6 pg — ABNORMAL LOW (ref 26.0–34.0)
MCHC: 32 g/dL (ref 30.0–36.0)
MCV: 76.9 fL — ABNORMAL LOW (ref 80.0–100.0)
Platelets: 275 10*3/uL (ref 150–400)
RBC: 4.63 MIL/uL (ref 4.22–5.81)
RDW: 16.5 % — ABNORMAL HIGH (ref 11.5–15.5)
WBC: 5.9 10*3/uL (ref 4.0–10.5)
nRBC: 0 % (ref 0.0–0.2)

## 2018-12-08 LAB — ACID FAST SMEAR (AFB, MYCOBACTERIA)
Acid Fast Smear: NEGATIVE
Acid Fast Smear: NEGATIVE
Acid Fast Smear: NEGATIVE

## 2018-12-08 MED ORDER — SULFAMETHOXAZOLE-TRIMETHOPRIM 400-80 MG PO TABS
1.0000 | ORAL_TABLET | Freq: Every day | ORAL | Status: DC
  Administered 2018-12-08 – 2018-12-10 (×3): 1 via ORAL
  Filled 2018-12-08 (×3): qty 1

## 2018-12-08 MED ORDER — AZITHROMYCIN 250 MG PO TABS
500.0000 mg | ORAL_TABLET | Freq: Every day | ORAL | Status: AC
  Administered 2018-12-08 – 2018-12-09 (×2): 500 mg via ORAL
  Filled 2018-12-08: qty 2

## 2018-12-08 MED ORDER — ADULT MULTIVITAMIN W/MINERALS CH
1.0000 | ORAL_TABLET | Freq: Every day | ORAL | Status: DC
  Administered 2018-12-08 – 2018-12-10 (×3): 1 via ORAL
  Filled 2018-12-08 (×3): qty 1

## 2018-12-08 MED ORDER — FLUCONAZOLE 100 MG PO TABS
100.0000 mg | ORAL_TABLET | ORAL | Status: DC
  Administered 2018-12-08: 100 mg via ORAL
  Filled 2018-12-08: qty 1

## 2018-12-08 NOTE — Progress Notes (Signed)
Patient ID: Jon Love, male   DOB: 03-01-1976, 43 y.o.   MRN: [id number]         Jon Love for Infectious Disease  Date of Admission:  12/05/2018           Day 4 azithromycin        Day 4 ceftriaxone ASSESSMENT: He seems to be improving on therapy for community-acquired pneumonia.  His first AFB smear is negative.  Continued clinical improvement and negative AFB smears would make TB much less likely, as would previously negative QuantiFERON assay.  I am going to check repeat HIV blood work.  I will start him back back on Trimethoprim/Sulfamethoxazole and fluconazole prophylaxis but will hold off on restarting antiretroviral therapy until we can rule out tuberculosis.  PLAN: 1. Continue current antibiotics 2. Start prophylactic doses of trimethoprim sulfamethoxazole and fluconazole 3. Repeat CD4 and HIV viral load  Principal Problem:   Cavitary pneumonia Active Problems:   HIV antibody positive (HCC)   Hyponatremia   Alcohol use   Microcytic anemia   Scheduled Meds: . enoxaparin (LOVENOX) injection  40 mg Subcutaneous Q24H  . loratadine  10 mg Oral Daily  . nystatin  5 mL Mouth/Throat QID   Continuous Infusions: . sodium chloride 100 mL/hr at 12/08/18 0631  . azithromycin 500 mg (12/07/18 1459)  . cefTRIAXone (ROCEPHIN)  IV 2 g (12/07/18 1558)   PRN Meds:.acetaminophen **OR** acetaminophen, guaiFENesin-dextromethorphan, ipratropium-albuterol   SUBJECTIVE: He is feeling a little bit better.  He is still having cough and right-sided pleuritic chest pain but his cough is no longer productive.  He describes having chills and sweats prior to admission but none after admission.  His HIV antibody was reported to be positive last night.  He has 2 medical records.  He is also known as Jon Love and has a record under the #[id number].  That record has the same date of birth and address.  He tells me that he was diagnosed with HIV infection hospitalized last  year.  Looking at his on the record he was actually diagnosed when hospitalized cholecystitis in 2016.  He was followed as an outpatient by my partner, Dr. Lita Mains, but had problems with adherence and never had good viral suppression or CD4 reconstitution.  His last blood work in 2017 showed a CD4 count of 10 and a viral load of 705,000.  His QuantiFERON TB Gold assay was negative in 2016  Review of Systems: Review of Systems  Constitutional: Positive for chills, diaphoresis, fever, malaise/fatigue and weight loss.  HENT: Negative for congestion and sore throat.   Respiratory: Positive for cough and sputum production. Negative for hemoptysis and shortness of breath.   Cardiovascular: Positive for chest pain.  Gastrointestinal: Negative for abdominal pain, diarrhea, nausea and vomiting.  Skin: Negative for rash.    No Known Allergies  OBJECTIVE: Vitals:   12/07/18 0532 12/07/18 1318 12/07/18 2214 12/08/18 0431  BP: 102/63 94/61 93/64  97/64  Pulse: 61 63 62 60  Resp: 20 14 16 18   Temp: 98.4 F (36.9 C) 98.4 F (36.9 C) 98.9 F (37.2 C) 98.3 F (36.8 C)  TempSrc: Oral Oral  Oral  SpO2: 94% 98% 99% 96%  Weight:      Height:       Body mass index is 17.83 kg/m.  Physical Exam Constitutional:      Comments: He is very pleasant and in no distress.  I examined him with the aid of the video interpreter.  HENT:  Mouth/Throat:     Pharynx: No oropharyngeal exudate.  Cardiovascular:     Rate and Rhythm: Normal rate and regular rhythm.     Heart sounds: No murmur.  Pulmonary:     Effort: Pulmonary effort is normal.     Breath sounds: Rales present.     Comments: Prominent rales in the base anteriorly right greater than left. Abdominal:     Palpations: Abdomen is soft.     Tenderness: There is no abdominal tenderness.  Psychiatric:        Mood and Affect: Mood normal.     Lab Results Lab Results  Component Value Date   WBC 5.9 12/08/2018   HGB 11.4 (L) 12/08/2018    HCT 35.6 (L) 12/08/2018   MCV 76.9 (L) 12/08/2018   PLT 275 12/08/2018    Lab Results  Component Value Date   CREATININE 0.64 12/08/2018   BUN 6 12/08/2018   NA 136 12/08/2018   K 4.7 12/08/2018   CL 103 12/08/2018   CO2 25 12/08/2018    Lab Results  Component Value Date   ALT 18 12/05/2018   AST 24 12/05/2018   ALKPHOS 96 12/05/2018   BILITOT 1.4 (H) 12/05/2018     Microbiology: Recent Results (from the past 240 hour(s))  Blood Culture (routine x 2)     Status: None (Preliminary result)   Collection Time: 12/05/18  3:30 PM   Specimen: BLOOD  Result Value Ref Range Status   Specimen Description BLOOD LEFT ANTECUBITAL  Final   Special Requests   Final    BOTTLES DRAWN AEROBIC AND ANAEROBIC Blood Culture results may not be optimal due to an excessive volume of blood received in culture bottles   Culture   Final    NO GROWTH 3 DAYS Performed at [facility], [address] [place], [place] [address]    Report Status PENDING  Incomplete  Blood Culture (routine x 2)     Status: None (Preliminary result)   Collection Time: 12/05/18  3:35 PM   Specimen: BLOOD RIGHT ARM  Result Value Ref Range Status   Specimen Description BLOOD RIGHT ARM  Final   Special Requests   Final    BOTTLES DRAWN AEROBIC AND ANAEROBIC Blood Culture adequate volume   Culture   Final    NO GROWTH 3 DAYS Performed at [facility], [address] [place], [place] [address]    Report Status PENDING  Incomplete  SARS Coronavirus 2 (CEPHEID- Performed in [facility]), Hosp Order     Status: None   Collection Time: 12/05/18  3:59 PM   Specimen: Nasopharyngeal Swab  Result Value Ref Range Status   SARS Coronavirus 2 NEGATIVE NEGATIVE Final    Comment: (NOTE) If result is NEGATIVE SARS-CoV-2 target nucleic acids are NOT DETECTED. The SARS-CoV-2 RNA is generally detectable in upper and lower  respiratory specimens during the acute phase of infection. The lowest   concentration of SARS-CoV-2 viral copies this assay can detect is 250  copies / mL. A negative result does not preclude SARS-CoV-2 infection  and should not be used as the sole basis for treatment or other  patient management decisions.  A negative result may occur with  improper specimen collection / handling, submission of specimen other  than nasopharyngeal swab, presence of viral mutation(s) within the  areas targeted by this assay, and inadequate number of viral copies  (<250 copies / mL). A negative result must be combined  with clinical  observations, patient history, and epidemiological information. If result is POSITIVE SARS-CoV-2 target nucleic acids are DETECTED. The SARS-CoV-2 RNA is generally detectable in upper and lower  respiratory specimens dur ing the acute phase of infection.  Positive  results are indicative of active infection with SARS-CoV-2.  Clinical  correlation with patient history and other diagnostic information is  necessary to determine patient infection status.  Positive results do  not rule out bacterial infection or co-infection with other viruses. If result is PRESUMPTIVE POSTIVE SARS-CoV-2 nucleic acids MAY BE PRESENT.   A presumptive positive result was obtained on the submitted specimen  and confirmed on repeat testing.  While 2019 novel coronavirus  (SARS-CoV-2) nucleic acids may be present in the submitted sample  additional confirmatory testing may be necessary for epidemiological  and / or clinical management purposes  to differentiate between  SARS-CoV-2 and other Sarbecovirus currently known to infect humans.  If clinically indicated additional testing with an alternate test  methodology [phone]([id number]) is advised. The SARS-CoV-2 RNA is generally  detectable in upper and lower respiratory sp ecimens during the acute  phase of infection. The expected result is Negative. Fact Sheet for Patients:  BoilerBrush.com.cyhttps://www.fda.gov/media/136312/download Fact Sheet  for Healthcare Providers: https://pope.com/https://www.fda.gov/media/136313/download This test is not yet approved or cleared by the [place] FDA and has been authorized for detection and/or diagnosis of SARS-CoV-2 by FDA under an Emergency Use Authorization (EUA).  This EUA will remain in effect (meaning this test can be used) for the duration of the COVID-19 declaration under Section 564(b)(1) of the Act, 21 U.S.C. section 360bbb-3(b)(1), unless the authorization is terminated or revoked sooner. Performed at [facility], [address] [place], [place] [address]   Acid Fast Smear (AFB)     Status: None   Collection Time: 12/06/18  5:30 PM   Specimen: Sputum  Result Value Ref Range Status   AFB Specimen Processing Concentration  Final   Acid Fast Smear Negative  Final    Comment: (NOTE) Performed At: [facility] [place] [address] [place], [place] [id number] Jolene SchimkeNagendra Sanjai MD NF:[id number]:[phone]    Source (AFB) EXPECTORATED SPUTUM  Final    Comment: Performed at [facility], [address] [place], [place] [address]    Cliffton AstersJohn Clarrisa Kaylor, MD [facility] [phone] pager   [phone] cell 12/08/2018, 1:46 PM

## 2018-12-08 NOTE — Progress Notes (Signed)
Initial Nutrition Assessment  DOCUMENTATION CODES:   Underweight  INTERVENTION:   -Magic cup TID with meals, each supplement provides 290 kcal and 9 grams of protein -MVI with minerals daily  NUTRITION DIAGNOSIS:   Increased nutrient needs related to acute illness(cavitary pneumonia) as evidenced by estimated needs.  GOAL:   Patient will meet greater than or equal to 90% of their needs  MONITOR:   PO intake, Supplement acceptance, Weight trends, Labs, Skin, I & O's  REASON FOR ASSESSMENT:   Other (Comment)    ASSESSMENT:   Jon Love is a 43 y.o. male with no significant past medical history presented to the ED with complaints of productive cough with thick yellow-green phlegm, myalgias associated with dyspnea.  Patient reports feeling chills at home and T-max in the ED at 100.2 F.  Patient was somewhat hypoxic on presentation per ED report with O2 sats in the 90% and requiring 2 L O2.  Currently he is saturating 98% on room air.  He tested negative for COVID and denies any contacts with any known COVID positives.  Pt admitted with acute hypoxic respiratory failure due to multifocal pneumonia with sepsis.   Reviewed I/O's: +3.4 L x 24 hours and +4.6 L since admission  Pt on airborne precautions to rule out TB. RD did not enter room in effort to preserve PPE.   Per ID notes, pt is HIV antibody positive; plan to check viral load.   Pt with good appetite; noted meal completion 100%.   No wt history to assess at this time. Due to increased nutrient needs and underweight status, pt is at high risk for malnutrition. RD will add supplements to help pt better meet nutritional needs.   Labs reviewed.   NUTRITION - FOCUSED PHYSICAL EXAM:    Most Recent Value  Orbital Region  Unable to assess  Upper Arm Region  Unable to assess  Thoracic and Lumbar Region  Unable to assess  Buccal Region  Unable to assess  Temple Region  Unable to assess  Clavicle Bone Region   Unable to assess  Clavicle and Acromion Bone Region  Unable to assess  Scapular Bone Region  Unable to assess  Dorsal Hand  Unable to assess  Patellar Region  Unable to assess  Anterior Thigh Region  Unable to assess  Posterior Calf Region  Unable to assess  Edema (RD Assessment)  Unable to assess  Hair  Unable to assess  Eyes  Unable to assess  Mouth  Unable to assess  Skin  Unable to assess  Nails  Unable to assess       Diet Order:   Diet Order            Diet regular Room service appropriate? Yes; Fluid consistency: Thin  Diet effective now              EDUCATION NEEDS:   No education needs have been identified at this time  Skin:  Skin Assessment: Reviewed RN Assessment  Last BM:  12/07/18  Height:   Ht Readings from Last 1 Encounters:  12/06/18 5\' 11"  (1.803 m)    Weight:   Wt Readings from Last 1 Encounters:  12/05/18 58 kg    Ideal Body Weight:  78.2 kg  BMI:  Body mass index is 17.83 kg/m.  Estimated Nutritional Needs:   Kcal:  1800-2000  Protein:  100-115 grams  Fluid:  > 1.8 L    Hatem Cull A. Jimmye Norman, RD, LDN, [place] Registered Dietitian  II Certified Diabetes Care and Education Specialist Pager: [phone] After hours Pager: [phone]

## 2018-12-08 NOTE — Progress Notes (Addendum)
PROGRESS NOTE  SulaAntonio Quill Hernadez ZOX:[id number] DOB: [birth year] DOA: 12/05/2018 PCP: Patient, No Pcp Per   LOS: 2 days   Brief Narrative 43 year old male without significant medical history came in with productive cough, myalgias, progressively worsening over the last 2 to 3 weeks.  He also reports a cough which has been productive of yellow sputum.  He was hypoxic on admission requiring 2 L nasal cannula.  He tested negative for Covid on admission.  Chest x-ray showed patchy airspace opacity in the right middle and lower lung zones. Patient was admitted, seen by infectious disease, remains on negative pressure room, on IV antibiotics and ruling out TB  Subjective: Was ambulating in the room having some cough.  No chest pain shortness of breath fever or chills.  Was able to ambulate without oxygen.  Assessment & Plan:  Acute hypoxic respiratory failure due to multifocal pneumonia with sepsis, POA: Sepsis parameters resolved.  Needing 2 L nasal cannula oxygen.  Continue pulmonary toileting, wean oxygen as tolerated.  Sepsis with multifocal cavitary pneumonia POA: Sepsis parameters improved.  No temperature spike, WC 5.9K.  Infectious disease following.  Patient reported symptoms for 4 to 5 weeks with night sweats weight loss cough coming from endemic area so ruling out tuberculosis. Awaiting on QuantiFERON TB and AFB smears x3 in the sputum-remains on negative airborne isolation until then. Continue plan as per infectious disease.  HIV ab +: new diagnosis-checking viral load and CD4  Mild hyponatremia resolved.  Alcohol use occasional.  No withdrawals.  DVT prophylaxis: Lovenox Code Status: Full code Family Communication: Plan of care discussed with the patient. Disposition Plan: Remains inpatient pending clinical improvement further test results   Consultants:   Infectious disease  Procedures:   None   Antimicrobials:  Ceftriaxone 6/28 >>  Azithromycin 6/28 >>     Scheduled Meds: . enoxaparin (LOVENOX) injection  40 mg Subcutaneous Q24H  . loratadine  10 mg Oral Daily  . nystatin  5 mL Mouth/Throat QID   Continuous Infusions: . sodium chloride 100 mL/hr at 12/08/18 0631  . azithromycin 500 mg (12/07/18 1459)  . cefTRIAXone (ROCEPHIN)  IV 2 g (12/07/18 1558)   PRN Meds:.acetaminophen **OR** acetaminophen, guaiFENesin-dextromethorphan, ipratropium-albuterol  Objective: Vitals:   12/07/18 0532 12/07/18 1318 12/07/18 2214 12/08/18 0431  BP: 102/63 94/61 93/64  97/64  Pulse: 61 63 62 60  Resp: 20 14 16 18   Temp: 98.4 F (36.9 C) 98.4 F (36.9 C) 98.9 F (37.2 C) 98.3 F (36.8 C)  TempSrc: Oral Oral  Oral  SpO2: 94% 98% 99% 96%  Weight:      Height:        Intake/Output Summary (Last 24 hours) at 12/08/2018 1238 Last data filed at 12/08/2018 0945 Gross per 24 hour  Intake 3399.32 ml  Output -  Net 3399.32 ml   Filed Weights   12/05/18 1658 12/05/18 2053  Weight: 77 kg 58 kg    Examination: General exam: Calm, comfortable, coughing, appears older than stated age, thin.   HEENT:Oral mucosa moist, Ear/Nose WNL grossly, dentition normal. Respiratory system: Bilateral knees at the base, right side with crackles posteriorly, Cardiovascular system: regular rate and rhythm, S1 & S2 heard, No JVD/murmurs. Gastrointestinal system: Abdomen soft, non-tender, non-distended, BS +. No hepatosplenomegaly palpable. Nervous System:Alert, awake and oriented at baseline. Able to move UE and LE, sensation intact. Extremities: No edema, distal peripheral pulses palpable.  Skin: No rashes,no icterus. MSK: Normal muscle bulk,tone, power  Data Reviewed: I have independently reviewed following labs  and imaging studies   CBC: Recent Labs  Lab 12/05/18 1500 12/06/18 0546 12/08/18 0252  WBC 12.8* 8.4 5.9  NEUTROABS 8.2*  --   --   HGB 13.2 11.9* 11.4*  HCT 40.1 35.8* 35.6*  MCV 76.4* 78.0* 76.9*  PLT 309 255 275   Basic Metabolic Panel: Recent  Labs  Lab 12/05/18 1500 12/06/18 0546 12/08/18 0252  NA 131* 135 136  K 4.5 3.9 4.7  CL 98 103 103  CO2 25 22 25   GLUCOSE 101* 95 104*  BUN 8 6 6   CREATININE 0.78 0.58* 0.64  CALCIUM 9.0 8.0* 8.7*   GFR: Estimated Creatinine Clearance: 98.7 mL/min (by C-G formula based on SCr of 0.64 mg/dL). Liver Function Tests: Recent Labs  Lab 12/05/18 1500  AST 24  ALT 18  ALKPHOS 96  BILITOT 1.4*  PROT 6.4*  ALBUMIN 3.2*   No results for input(s): LIPASE, AMYLASE in the last 168 hours. No results for input(s): AMMONIA in the last 168 hours. Coagulation Profile: No results for input(s): INR, PROTIME in the last 168 hours. Cardiac Enzymes: No results for input(s): CKTOTAL, CKMB, CKMBINDEX, TROPONINI in the last 168 hours. BNP (last 3 results) No results for input(s): PROBNP in the last 8760 hours. HbA1C: No results for input(s): HGBA1C in the last 72 hours. CBG: No results for input(s): GLUCAP in the last 168 hours. Lipid Profile: No results for input(s): CHOL, HDL, LDLCALC, TRIG, CHOLHDL, LDLDIRECT in the last 72 hours. Thyroid Function Tests: No results for input(s): TSH, T4TOTAL, FREET4, T3FREE, THYROIDAB in the last 72 hours. Anemia Panel: No results for input(s): VITAMINB12, FOLATE, FERRITIN, TIBC, IRON, RETICCTPCT in the last 72 hours. Urine analysis:    Component Value Date/Time   COLORURINE YELLOW 12/05/2018 1608   APPEARANCEUR CLEAR 12/05/2018 1608   LABSPEC 1.017 12/05/2018 1608   PHURINE 7.0 12/05/2018 1608   GLUCOSEU NEGATIVE 12/05/2018 1608   HGBUR NEGATIVE 12/05/2018 1608   BILIRUBINUR NEGATIVE 12/05/2018 1608   KETONESUR NEGATIVE 12/05/2018 1608   PROTEINUR NEGATIVE 12/05/2018 1608   NITRITE NEGATIVE 12/05/2018 1608   LEUKOCYTESUR NEGATIVE 12/05/2018 1608   Sepsis Labs: Invalid input(s): PROCALCITONIN, LACTICIDVEN  Recent Results (from the past 240 hour(s))  Blood Culture (routine x 2)     Status: None (Preliminary result)   Collection Time: 12/05/18   3:30 PM   Specimen: BLOOD  Result Value Ref Range Status   Specimen Description BLOOD LEFT ANTECUBITAL  Final   Special Requests   Final    BOTTLES DRAWN AEROBIC AND ANAEROBIC Blood Culture results may not be optimal due to an excessive volume of blood received in culture bottles   Culture   Final    NO GROWTH 3 DAYS Performed at [facility], [address] [place], [place] [address]    Report Status PENDING  Incomplete  Blood Culture (routine x 2)     Status: None (Preliminary result)   Collection Time: 12/05/18  3:35 PM   Specimen: BLOOD RIGHT ARM  Result Value Ref Range Status   Specimen Description BLOOD RIGHT ARM  Final   Special Requests   Final    BOTTLES DRAWN AEROBIC AND ANAEROBIC Blood Culture adequate volume   Culture   Final    NO GROWTH 3 DAYS Performed at [facility], [address] [place], [place] [address]    Report Status PENDING  Incomplete  SARS Coronavirus 2 (CEPHEID- Performed in [facility]), [facility] Order  Status: None   Collection Time: 12/05/18  3:59 PM   Specimen: Nasopharyngeal Swab  Result Value Ref Range Status   SARS Coronavirus 2 NEGATIVE NEGATIVE Final    Comment: (NOTE) If result is NEGATIVE SARS-CoV-2 target nucleic acids are NOT DETECTED. The SARS-CoV-2 RNA is generally detectable in upper and lower  respiratory specimens during the acute phase of infection. The lowest  concentration of SARS-CoV-2 viral copies this assay can detect is 250  copies / mL. A negative result does not preclude SARS-CoV-2 infection  and should not be used as the sole basis for treatment or other  patient management decisions.  A negative result may occur with  improper specimen collection / handling, submission of specimen other  than nasopharyngeal swab, presence of viral mutation(s) within the  areas targeted by this assay, and inadequate number of viral copies  (<250 copies / mL). A negative result must be combined with clinical   observations, patient history, and epidemiological information. If result is POSITIVE SARS-CoV-2 target nucleic acids are DETECTED. The SARS-CoV-2 RNA is generally detectable in upper and lower  respiratory specimens dur ing the acute phase of infection.  Positive  results are indicative of active infection with SARS-CoV-2.  Clinical  correlation with patient history and other diagnostic information is  necessary to determine patient infection status.  Positive results do  not rule out bacterial infection or co-infection with other viruses. If result is PRESUMPTIVE POSTIVE SARS-CoV-2 nucleic acids MAY BE PRESENT.   A presumptive positive result was obtained on the submitted specimen  and confirmed on repeat testing.  While 2019 novel coronavirus  (SARS-CoV-2) nucleic acids may be present in the submitted sample  additional confirmatory testing may be necessary for epidemiological  and / or clinical management purposes  to differentiate between  SARS-CoV-2 and other Sarbecovirus currently known to infect humans.  If clinically indicated additional testing with an alternate test  methodology [phone]) is advised. The SARS-CoV-2 RNA is generally  detectable in upper and lower respiratory sp ecimens during the acute  phase of infection. The expected result is Negative. Fact Sheet for Patients:  StrictlyIdeas.no Fact Sheet for Healthcare Providers: BankingDealers.co.za This test is not yet approved or cleared by the [place] FDA and has been authorized for detection and/or diagnosis of SARS-CoV-2 by FDA under an Emergency Use Authorization (EUA).  This EUA will remain in effect (meaning this test can be used) for the duration of the COVID-19 declaration under Section 564(b)(1) of the Act, 21 U.S.C. section 360bbb-3(b)(1), unless the authorization is terminated or revoked sooner. Performed at [facility], [place] [address],  [place], [place] [address]   Acid Fast Smear (AFB)     Status: None   Collection Time: 12/06/18  5:30 PM   Specimen: Sputum  Result Value Ref Range Status   AFB Specimen Processing Concentration  Final   Acid Fast Smear Negative  Final    Comment: (NOTE) Performed At: [facility] [place], [place] [id number] Rush Farmer MD EV:[id number]    Source (AFB) EXPECTORATED SPUTUM  Final    Comment: Performed at [facility], [place] [address] [place], [place] [address]      Radiology Studies: No results found.  Marzetta Board, MD, PhD [facility]  Contact via  [email or web address]  [place] P: [phone] F: [phone]

## 2018-12-09 ENCOUNTER — Inpatient Hospital Stay (HOSPITAL_COMMUNITY): Payer: Self-pay

## 2018-12-09 DIAGNOSIS — R05 Cough: Secondary | ICD-10-CM

## 2018-12-09 DIAGNOSIS — Z21 Asymptomatic human immunodeficiency virus [HIV] infection status: Secondary | ICD-10-CM

## 2018-12-09 DIAGNOSIS — R634 Abnormal weight loss: Secondary | ICD-10-CM

## 2018-12-09 DIAGNOSIS — Z681 Body mass index (BMI) 19 or less, adult: Secondary | ICD-10-CM

## 2018-12-09 LAB — QUANTIFERON-TB GOLD PLUS (RQFGPL)
QuantiFERON Mitogen Value: 7.92 [IU]/mL
QuantiFERON Nil Value: 0.06 [IU]/mL
QuantiFERON TB1 Ag Value: 0.07 [IU]/mL
QuantiFERON TB2 Ag Value: 0.07 [IU]/mL

## 2018-12-09 LAB — QUANTIFERON-TB GOLD PLUS: QuantiFERON-TB Gold Plus: NEGATIVE

## 2018-12-09 LAB — T-HELPER CELLS (CD4) COUNT (NOT AT ARMC)
CD4 % Helper T Cell: 1 % — ABNORMAL LOW (ref 33–65)
CD4 T Cell Abs: <35 /uL — ABNORMAL LOW (ref 400–1790)

## 2018-12-09 IMAGING — DX PORTABLE CHEST - 1 VIEW
1 series · 1 of 1 positions shown · non-contrast
Comparison: [DATE]

CLINICAL DATA: Productive cough for 2 weeks

EXAM:
PORTABLE CHEST 1 VIEW

[chest]
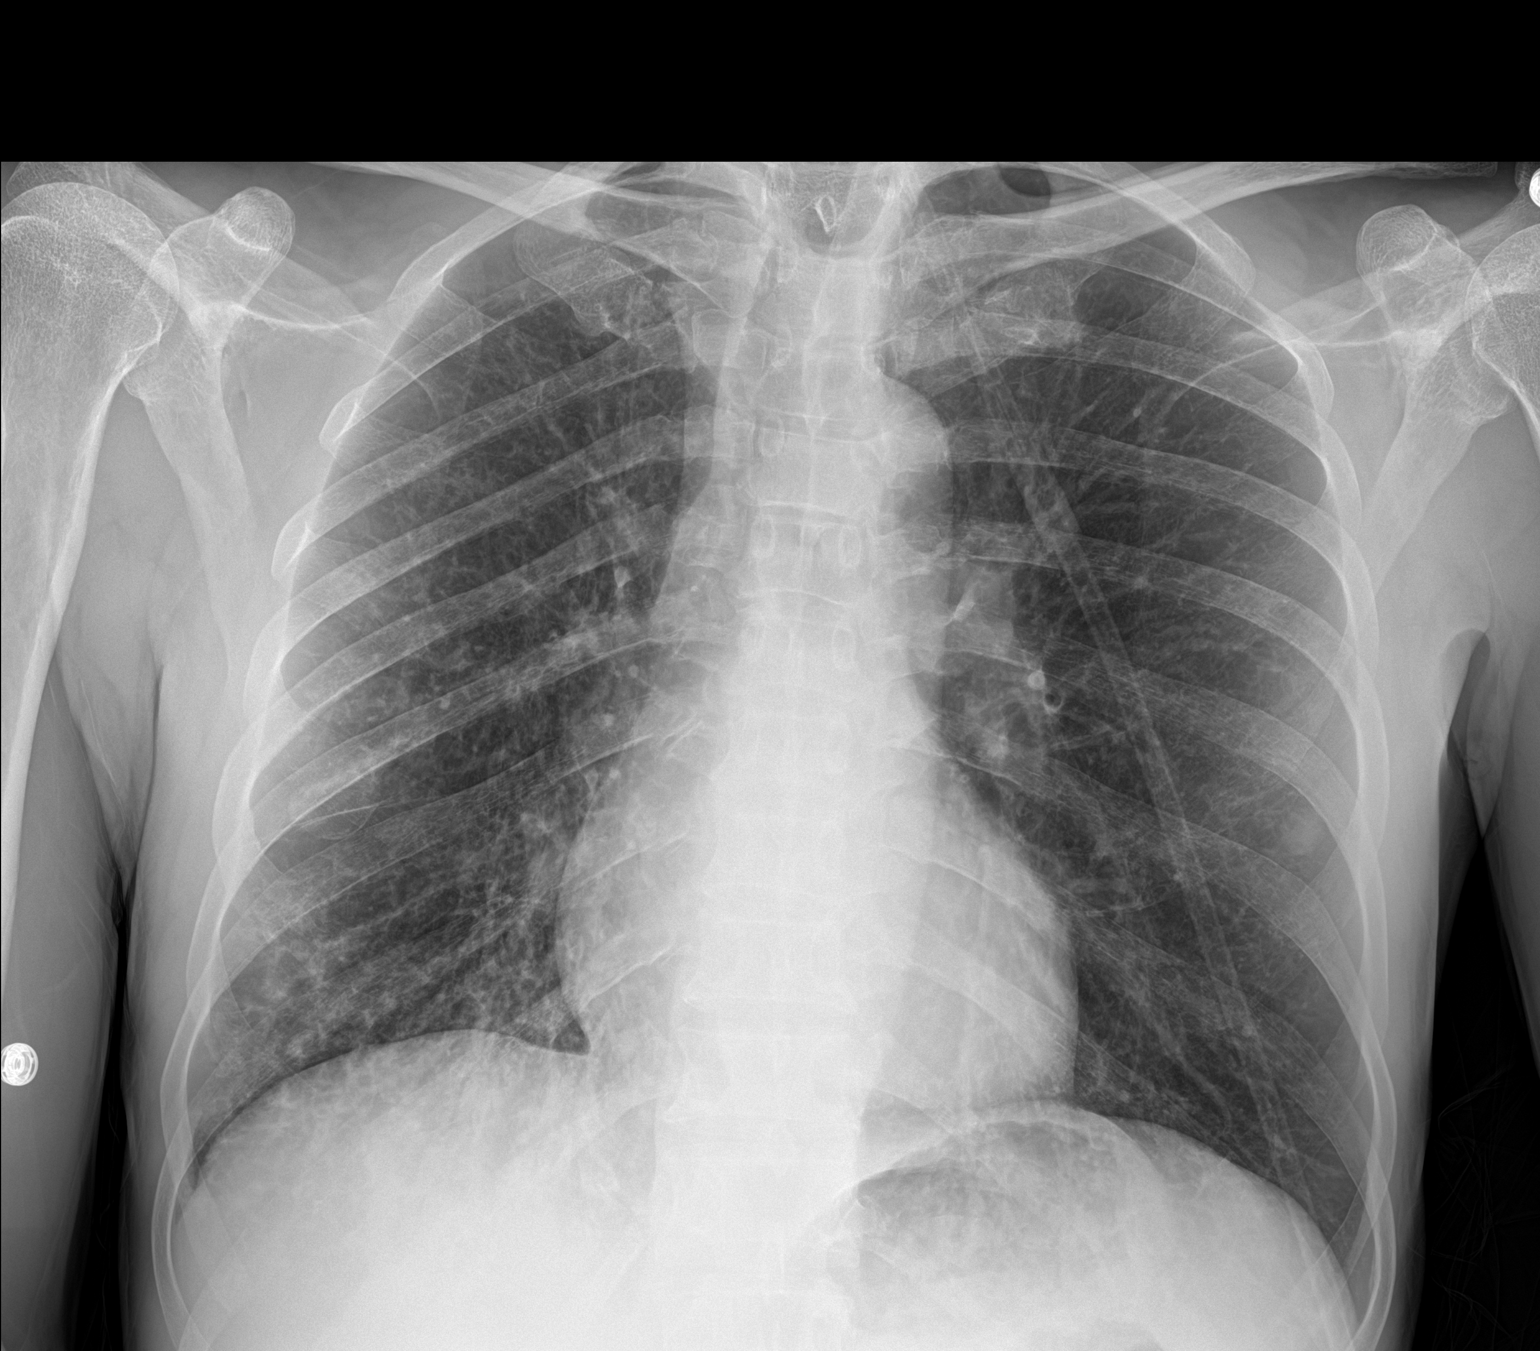

[1 of 1 positions shown; findings below may reference images not displayed]

FINDINGS: Cardiac shadow is within normal limits. The lungs are well aerated
bilaterally. Patchy infiltrates are again seen in the right base but
improved when compared with the prior exam. Nipple shadows are noted
bilaterally. No acute bony abnormality is seen.
IMPRESSION: Improved aeration in the right base.

## 2018-12-09 MED ORDER — TRIUMEQ 600-50-300 MG PO TABS: 1.0000 | ORAL_TABLET | Freq: Every day | ORAL | 0 refills | Status: DC

## 2018-12-09 MED ORDER — BICTEGRAVIR-EMTRICITAB-TENOFOV 50-200-25 MG PO TABS
1.0000 | ORAL_TABLET | Freq: Every day | ORAL | Status: DC
  Administered 2018-12-09: 1 via ORAL
  Filled 2018-12-09: qty 1

## 2018-12-09 MED FILL — TRIUMEQ 600-50-300 MG TABS: 600-50-300 | 30 days supply | Qty: 30 | Fill #0

## 2018-12-09 NOTE — Progress Notes (Signed)
PROGRESS NOTE  Jon Love:[id number] DOB: [birth year] DOA: 12/05/2018 PCP: Patient, No Pcp Per   LOS: 3 days   Brief Narrative 43 year old male without significant medical history came in with productive cough, myalgias, progressively worsening over the last 2 to 3 weeks.  He also reports a cough which has been productive of yellow sputum.  He was hypoxic on admission requiring 2 L nasal cannula.  He tested negative for Covid on admission.  Chest x-ray showed patchy airspace opacity in the right middle and lower lung zones. Patient was admitted, seen by infectious disease, remains on negative pressure room, on IV antibiotics and ruling out TB.  Subjective: Resting comfortably.  No fever overnight.  Had some mild cough.  Feels better otherwise no pain.  Assessment & Plan:  Acute hypoxic respiratory failure due to multifocal pneumonia with sepsis, POA: Sepsis parameters resolved.  Continue to wean off oxygen, encourage ambulation pulmonary toileting  Sepsis with multifocal cavitary pneumonia POA: Sepsis parameters improved.  No temperature spike now, WBC 5.9K stable. ID following.  Patient reported symptoms for 4 to 5 weeks with night sweats weight loss cough coming from endemic area so ruling out tuberculosis. Awaiting on QuantiFERON TB pending and AFB smears neg x2, third pending.  Improvement in his clinical presentation likely pneumonia less likely TB. Continue plan as per infectious disease.on airborne isolation.  Discussed with infectious disease today.  HIV ab +: Patient has known diagnosis of HIV.  CD4 less than 35.  Now placed on prophylaxis with weekly Diflucan 100 mg and Bactrim 1 daily. Viral rna pending  Mild hyponatremia resolved.  Alcohol use occasional.  No withdrawals.  DVT prophylaxis: Lovenox Code Status: Full code Family Communication: Plan of care discussed with the patient. Disposition Plan: Remains inpatient pending clinical improvement further test  results   Consultants:   Infectious disease  Procedures:   None   Antimicrobials:  Ceftriaxone 6/28 >>  Azithromycin 6/28 >>    Scheduled Meds:  enoxaparin (LOVENOX) injection  40 mg Subcutaneous Q24H   fluconazole  100 mg Oral Weekly   loratadine  10 mg Oral Daily   multivitamin with minerals  1 tablet Oral Daily   nystatin  5 mL Mouth/Throat QID   sulfamethoxazole-trimethoprim  1 tablet Oral Daily   Continuous Infusions:  sodium chloride 100 mL/hr at 12/09/18 0514   cefTRIAXone (ROCEPHIN)  IV 2 g (12/08/18 1519)   PRN Meds:.acetaminophen **OR** acetaminophen, guaiFENesin-dextromethorphan, ipratropium-albuterol  Objective: Vitals:   12/08/18 0431 12/08/18 1609 12/08/18 2202 12/09/18 0516  BP: 97/64 95/72 98/70  94/64  Pulse: 60 64 63 61  Resp: 18 16 16 17   Temp: 98.3 F (36.8 C) 97.8 F (36.6 C) 97.6 F (36.4 C) 98.4 F (36.9 C)  TempSrc: Oral Oral Oral Oral  SpO2: 96% 98% 99% 99%  Weight:      Height:        Intake/Output Summary (Last 24 hours) at 12/09/2018 1300 Last data filed at 12/08/2018 1315 Gross per 24 hour  Intake 480 ml  Output --  Net 480 ml   Filed Weights   12/05/18 1658 12/05/18 2053  Weight: 77 kg 58 kg    Examination: General exam: Calm, comfortable, older than stated age, thin and frail.   HEENT:Oral mucosa moist, Ear/Nose WNL grossly, dentition normal. Respiratory system: Bilateral knees at the base, right side with crackles posteriorly, Cardiovascular system: RRR,S1 & S2 heard, No JVD/murmurs. Gastrointestinal system: Abdomen soft, non-tender, non-distended, BS +. Nervous System:Alert, awake and oriented at  baseline. Able to move UE and LE, sensation intact. Extremities: No edema, distal peripheral pulses palpable.  Skin: No rashes,no icterus. MSK: Normal muscle bulk,tone, power  Data Reviewed: I have independently reviewed following labs and imaging studies   CBC: Recent Labs  Lab 12/05/18 1500 12/06/18 0546  12/08/18 0252  WBC 12.8* 8.4 5.9  NEUTROABS 8.2*  --   --   HGB 13.2 11.9* 11.4*  HCT 40.1 35.8* 35.6*  MCV 76.4* 78.0* 76.9*  PLT 309 255 275   Basic Metabolic Panel: Recent Labs  Lab 12/05/18 1500 12/06/18 0546 12/08/18 0252  NA 131* 135 136  K 4.5 3.9 4.7  CL 98 103 103  CO2 25 22 25   GLUCOSE 101* 95 104*  BUN 8 6 6   CREATININE 0.78 0.58* 0.64  CALCIUM 9.0 8.0* 8.7*   GFR: Estimated Creatinine Clearance: 98.7 mL/min (by C-G formula based on SCr of 0.64 mg/dL). Liver Function Tests: Recent Labs  Lab 12/05/18 1500  AST 24  ALT 18  ALKPHOS 96  BILITOT 1.4*  PROT 6.4*  ALBUMIN 3.2*   No results for input(s): LIPASE, AMYLASE in the last 168 hours. No results for input(s): AMMONIA in the last 168 hours. Coagulation Profile: No results for input(s): INR, PROTIME in the last 168 hours. Cardiac Enzymes: No results for input(s): CKTOTAL, CKMB, CKMBINDEX, TROPONINI in the last 168 hours. BNP (last 3 results) No results for input(s): PROBNP in the last 8760 hours. HbA1C: No results for input(s): HGBA1C in the last 72 hours. CBG: No results for input(s): GLUCAP in the last 168 hours. Lipid Profile: No results for input(s): CHOL, HDL, LDLCALC, TRIG, CHOLHDL, LDLDIRECT in the last 72 hours. Thyroid Function Tests: No results for input(s): TSH, T4TOTAL, FREET4, T3FREE, THYROIDAB in the last 72 hours. Anemia Panel: No results for input(s): VITAMINB12, FOLATE, FERRITIN, TIBC, IRON, RETICCTPCT in the last 72 hours. Urine analysis:    Component Value Date/Time   COLORURINE YELLOW 12/05/2018 1608   APPEARANCEUR CLEAR 12/05/2018 1608   LABSPEC 1.017 12/05/2018 1608   PHURINE 7.0 12/05/2018 1608   GLUCOSEU NEGATIVE 12/05/2018 1608   HGBUR NEGATIVE 12/05/2018 1608   BILIRUBINUR NEGATIVE 12/05/2018 1608   KETONESUR NEGATIVE 12/05/2018 1608   PROTEINUR NEGATIVE 12/05/2018 1608   NITRITE NEGATIVE 12/05/2018 1608   LEUKOCYTESUR NEGATIVE 12/05/2018 1608   Sepsis  Labs: Invalid input(s): PROCALCITONIN, LACTICIDVEN  Recent Results (from the past 240 hour(s))  Blood Culture (routine x 2)     Status: None (Preliminary result)   Collection Time: 12/05/18  3:30 PM   Specimen: BLOOD  Result Value Ref Range Status   Specimen Description BLOOD LEFT ANTECUBITAL  Final   Special Requests   Final    BOTTLES DRAWN AEROBIC AND ANAEROBIC Blood Culture results may not be optimal due to an excessive volume of blood received in culture bottles   Culture   Final    NO GROWTH 4 DAYS Performed at [facility], [address] [place], [place] [address]    Report Status PENDING  Incomplete  Blood Culture (routine x 2)     Status: None (Preliminary result)   Collection Time: 12/05/18  3:35 PM   Specimen: BLOOD RIGHT ARM  Result Value Ref Range Status   Specimen Description BLOOD RIGHT ARM  Final   Special Requests   Final    BOTTLES DRAWN AEROBIC AND ANAEROBIC Blood Culture adequate volume   Culture   Final    NO GROWTH 4 DAYS Performed at [facility], [address] [place], [place] [address]    Report Status PENDING  Incomplete  SARS Coronavirus 2 (CEPHEID- Performed in [facility]), Hosp Order     Status: None   Collection Time: 12/05/18  3:59 PM   Specimen: Nasopharyngeal Swab  Result Value Ref Range Status   SARS Coronavirus 2 NEGATIVE NEGATIVE Final    Comment: (NOTE) If result is NEGATIVE SARS-CoV-2 target nucleic acids are NOT DETECTED. The SARS-CoV-2 RNA is generally detectable in upper and lower  respiratory specimens during the acute phase of infection. The lowest  concentration of SARS-CoV-2 viral copies this assay can detect is 250  copies / mL. A negative result does not preclude SARS-CoV-2 infection  and should not be used as the sole basis for treatment or other  patient management decisions.  A negative result may occur with  improper specimen collection / handling, submission of specimen other  than  nasopharyngeal swab, presence of viral mutation(s) within the  areas targeted by this assay, and inadequate number of viral copies  (<250 copies / mL). A negative result must be combined with clinical  observations, patient history, and epidemiological information. If result is POSITIVE SARS-CoV-2 target nucleic acids are DETECTED. The SARS-CoV-2 RNA is generally detectable in upper and lower  respiratory specimens dur ing the acute phase of infection.  Positive  results are indicative of active infection with SARS-CoV-2.  Clinical  correlation with patient history and other diagnostic information is  necessary to determine patient infection status.  Positive results do  not rule out bacterial infection or co-infection with other viruses. If result is PRESUMPTIVE POSTIVE SARS-CoV-2 nucleic acids MAY BE PRESENT.   A presumptive positive result was obtained on the submitted specimen  and confirmed on repeat testing.  While 2019 novel coronavirus  (SARS-CoV-2) nucleic acids may be present in the submitted sample  additional confirmatory testing may be necessary for epidemiological  and / or clinical management purposes  to differentiate between  SARS-CoV-2 and other Sarbecovirus currently known to infect humans.  If clinically indicated additional testing with an alternate test  methodology [phone]([id number]) is advised. The SARS-CoV-2 RNA is generally  detectable in upper and lower respiratory sp ecimens during the acute  phase of infection. The expected result is Negative. Fact Sheet for Patients:  BoilerBrush.com.cyhttps://www.fda.gov/media/136312/download Fact Sheet for Healthcare Providers: https://pope.com/https://www.fda.gov/media/136313/download This test is not yet approved or cleared by the [place] FDA and has been authorized for detection and/or diagnosis of SARS-CoV-2 by FDA under an Emergency Use Authorization (EUA).  This EUA will remain in effect (meaning this test can be used) for the duration of  the COVID-19 declaration under Section 564(b)(1) of the Act, 21 U.S.C. section 360bbb-3(b)(1), unless the authorization is terminated or revoked sooner. Performed at [facility], [address] [place], [place] [address]   Acid Fast Smear (AFB)     Status: None   Collection Time: 12/06/18  5:30 PM   Specimen: Sputum  Result Value Ref Range Status   AFB Specimen Processing Concentration  Final   Acid Fast Smear Negative  Final    Comment: (NOTE) Performed At: [facility] [place] [address] [place], [place] [id number] Jolene SchimkeNagendra Sanjai MD WU:[id number]:[phone]    Source (AFB) EXPECTORATED SPUTUM  Final    Comment: Performed at [facility], [address] [place], [place] [address]  Acid Fast Smear (AFB)     Status: None   Collection Time: 12/07/18 10:13 AM  Specimen: Sputum  Result Value Ref Range Status   AFB Specimen Processing Concentration  Final   Acid Fast Smear Negative  Final    Comment: (NOTE) Performed At: [facility] [place] [address] [place], [place] [id number] Jolene SchimkeNagendra Sanjai MD WU:[id number]:[phone]    Source (AFB) SPUTUM  Final    Comment: Performed at [facility], [address] [place], [place] [address]  Acid Fast Smear (AFB)     Status: None   Collection Time: 12/07/18  1:15 PM   Specimen: Sputum  Result Value Ref Range Status   AFB Specimen Processing Concentration  Final   Acid Fast Smear Negative  Final    Comment: (NOTE) Performed At: [facility] [place] [address] [place], [place] [id number] Jolene SchimkeNagendra Sanjai MD NG:[id number]:[phone]    Source (AFB) SPUTUM  Final    Comment: Performed at [facility], [address] [place], [place] [address]      Radiology Studies: No results found.  Pamella Pertostin Gherghe, MD, PhD [facility]  Contact via  [email or web address]  [facility] Office Info P: [phone] F: [phone]

## 2018-12-09 NOTE — Progress Notes (Addendum)
Patient ID: Jon Love, male   DOB: 10-30-1975, 43 y.o.   MRN: [id number]         [facility]  Date of Admission:  12/05/2018           Day 5 azithromycin        Day 5 ceftriaxone ASSESSMENT: He is improving on therapy for community-acquired pneumonia.  He has had 3- sputum AFB smears.  I will take him out of airborne isolation and repeat his chest x-ray.  Addendum: His chest x-ray shows marked improvement in his right lower lobe pneumonia.  I had him stop azithromycin and ceftriaxone now.  We discovered that he is not eligible for Biktarvy but may be eligible to start Triumeq.  Once that is arranged he can be discharged later this evening or tomorrow morning.  I have arranged follow-up in my clinic.  PLAN: 1. Repeat chest x-ray 2. Consider stopping antibiotics if chest x-ray is improved 3. Discontinue airborne precautions 4. See if we can get him free access to Triumeq 5. Continue prophylactic doses of trimethoprim sulfamethoxazole and fluconazole 6. Follow-up in my clinic on 12/23/2018 at 10:15 AM 7. I will sign off now  Principal Problem:   Cavitary pneumonia Active Problems:   HIV antibody positive (HCC)   Hyponatremia   Alcohol use   Microcytic anemia   Scheduled Meds:  enoxaparin (LOVENOX) injection  40 mg Subcutaneous Q24H   fluconazole  100 mg Oral Weekly   loratadine  10 mg Oral Daily   multivitamin with minerals  1 tablet Oral Daily   nystatin  5 mL Mouth/Throat QID   sulfamethoxazole-trimethoprim  1 tablet Oral Daily   Continuous Infusions:  sodium chloride 100 mL/hr at 12/09/18 0514   cefTRIAXone (ROCEPHIN)  IV 2 g (12/08/18 1519)   PRN Meds:.acetaminophen **OR** acetaminophen, guaiFENesin-dextromethorphan, ipratropium-albuterol   SUBJECTIVE: He is feeling much better.  His cough has diminished and he is no longer bringing up any sputum.  He denies any shortness of breath.  He has not had any more chills or  sweats.  Review of Systems: Review of Systems  Constitutional: Positive for weight loss. Negative for chills, diaphoresis, fever and malaise/fatigue.  HENT: Negative for congestion and sore throat.   Respiratory: Positive for cough. Negative for hemoptysis, sputum production and shortness of breath.   Cardiovascular: Negative for chest pain.  Gastrointestinal: Negative for abdominal pain, diarrhea, nausea and vomiting.  Skin: Negative for rash.    No Known Allergies  OBJECTIVE: Vitals:   12/08/18 0431 12/08/18 1609 12/08/18 2202 12/09/18 0516  BP: 97/64 95/72 98/70  94/64  Pulse: 60 64 63 61  Resp: 18 16 16 17   Temp: 98.3 F (36.8 C) 97.8 F (36.6 C) 97.6 F (36.4 C) 98.4 F (36.9 C)  TempSrc: Oral Oral Oral Oral  SpO2: 96% 98% 99% 99%  Weight:      Height:       Body mass index is 17.83 kg/m.  Physical Exam Constitutional:      Comments: He appears quite comfortable and in good spirits.  I examined him with the aid of the video interpreter.  HENT:     Mouth/Throat:     Pharynx: No oropharyngeal exudate.  Cardiovascular:     Rate and Rhythm: Normal rate and regular rhythm.     Heart sounds: No murmur.  Pulmonary:     Effort: Pulmonary effort is normal.     Breath sounds: No rales.     Comments:  He still has some crackles in his right base posteriorly but this is much improved since yesterday. Abdominal:     Palpations: Abdomen is soft.     Tenderness: There is no abdominal tenderness.  Psychiatric:        Mood and Affect: Mood normal.     Lab Results Lab Results  Component Value Date   WBC 5.9 12/08/2018   HGB 11.4 (L) 12/08/2018   HCT 35.6 (L) 12/08/2018   MCV 76.9 (L) 12/08/2018   PLT 275 12/08/2018    Lab Results  Component Value Date   CREATININE 0.64 12/08/2018   BUN 6 12/08/2018   NA 136 12/08/2018   K 4.7 12/08/2018   CL 103 12/08/2018   CO2 25 12/08/2018    Lab Results  Component Value Date   ALT 18 12/05/2018   AST 24 12/05/2018    ALKPHOS 96 12/05/2018   BILITOT 1.4 (H) 12/05/2018    CD4 T Cell Abs (/uL)  Date Value  12/08/2018 <35 (L)   Microbiology: Recent Results (from the past 240 hour(s))  Blood Culture (routine x 2)     Status: None (Preliminary result)   Collection Time: 12/05/18  3:30 PM   Specimen: BLOOD  Result Value Ref Range Status   Specimen Description BLOOD LEFT ANTECUBITAL  Final   Special Requests   Final    BOTTLES DRAWN AEROBIC AND ANAEROBIC Blood Culture results may not be optimal due to an excessive volume of blood received in culture bottles   Culture   Final    NO GROWTH 4 DAYS Performed at [facility], [address] [place], [place] [address]    Report Status PENDING  Incomplete  Blood Culture (routine x 2)     Status: None (Preliminary result)   Collection Time: 12/05/18  3:35 PM   Specimen: BLOOD RIGHT ARM  Result Value Ref Range Status   Specimen Description BLOOD RIGHT ARM  Final   Special Requests   Final    BOTTLES DRAWN AEROBIC AND ANAEROBIC Blood Culture adequate volume   Culture   Final    NO GROWTH 4 DAYS Performed at [facility], [address] [place], [place] [address]    Report Status PENDING  Incomplete  SARS Coronavirus 2 (CEPHEID- Performed in [facility]), Hosp Order     Status: None   Collection Time: 12/05/18  3:59 PM   Specimen: Nasopharyngeal Swab  Result Value Ref Range Status   SARS Coronavirus 2 NEGATIVE NEGATIVE Final    Comment: (NOTE) If result is NEGATIVE SARS-CoV-2 target nucleic acids are NOT DETECTED. The SARS-CoV-2 RNA is generally detectable in upper and lower  respiratory specimens during the acute phase of infection. The lowest  concentration of SARS-CoV-2 viral copies this assay can detect is 250  copies / mL. A negative result does not preclude SARS-CoV-2 infection  and should not be used as the sole basis for treatment or other  patient management decisions.  A negative result may occur with  improper  specimen collection / handling, submission of specimen other  than nasopharyngeal swab, presence of viral mutation(s) within the  areas targeted by this assay, and inadequate number of viral copies  (<250 copies / mL). A negative result must be combined with clinical  observations, patient history, and epidemiological information. If result is POSITIVE SARS-CoV-2 target nucleic acids are DETECTED. The SARS-CoV-2 RNA is generally detectable in upper and lower  respiratory specimens dur ing the  acute phase of infection.  Positive  results are indicative of active infection with SARS-CoV-2.  Clinical  correlation with patient history and other diagnostic information is  necessary to determine patient infection status.  Positive results do  not rule out bacterial infection or co-infection with other viruses. If result is PRESUMPTIVE POSTIVE SARS-CoV-2 nucleic acids MAY BE PRESENT.   A presumptive positive result was obtained on the submitted specimen  and confirmed on repeat testing.  While 2019 novel coronavirus  (SARS-CoV-2) nucleic acids may be present in the submitted sample  additional confirmatory testing may be necessary for epidemiological  and / or clinical management purposes  to differentiate between  SARS-CoV-2 and other Sarbecovirus currently known to infect humans.  If clinically indicated additional testing with an alternate test  methodology [phone]) is advised. The SARS-CoV-2 RNA is generally  detectable in upper and lower respiratory sp ecimens during the acute  phase of infection. The expected result is Negative. Fact Sheet for Patients:  StrictlyIdeas.no Fact Sheet for Healthcare Providers: BankingDealers.co.za This test is not yet approved or cleared by the [place] FDA and has been authorized for detection and/or diagnosis of SARS-CoV-2 by FDA under an Emergency Use Authorization (EUA).  This EUA will remain in  effect (meaning this test can be used) for the duration of the COVID-19 declaration under Section 564(b)(1) of the Act, 21 U.S.C. section 360bbb-3(b)(1), unless the authorization is terminated or revoked sooner. Performed at [facility], [place] [address] [place], [place] [address]   Acid Fast Smear (AFB)     Status: None   Collection Time: 12/06/18  5:30 PM   Specimen: Sputum  Result Value Ref Range Status   AFB Specimen Processing Concentration  Final   Acid Fast Smear Negative  Final    Comment: (NOTE) Performed At: [facility] [place], [place] [id number] Rush Farmer MD [id number]    Source (AFB) EXPECTORATED SPUTUM  Final    Comment: Performed at [facility], [place] [address], [place], [place] [address]  Acid Fast Smear (AFB)     Status: None   Collection Time: 12/07/18 10:13 AM   Specimen: Sputum  Result Value Ref Range Status   AFB Specimen Processing Concentration  Final   Acid Fast Smear Negative  Final    Comment: (NOTE) Performed At: [facility] [place], [place] [id number] Rush Farmer MD [id number]    Source (AFB) SPUTUM  Final    Comment: Performed at [facility], [place] [address], [place], [place] [address]  Acid Fast Smear (AFB)     Status: None   Collection Time: 12/07/18  1:15 PM   Specimen: Sputum  Result Value Ref Range Status   AFB Specimen Processing Concentration  Final   Acid Fast Smear Negative  Final    Comment: (NOTE) Performed At: [facility] [place], [place] [id number] Rush Farmer MD [id number]    Source (AFB) SPUTUM  Final    Comment: Performed at [facility], [place] [address], [place], [place] [address]    Michel Bickers, [place] [facility] [employer] [phone] pager   [phone] cell 12/09/2018, 3:02 PM

## 2018-12-09 NOTE — Progress Notes (Signed)
ID Pharmacy Note    Patient had been approved for the [facility] for Triumeq for an emergency 30 day fill.   Billing information as follows:  ID: [id number] [id number] PCN: [id number] Group: [id number]   A 30 day prescription has been [facility]. They will order the drug and it will arrive tomorrow morning (7/3) and be delivered to the patients bedside.     Jimmy Footman, PharmD, BCPS, BCIDP Infectious Diseases Clinical Pharmacist Phone: [phone] 12/09/2018 4:19 PM

## 2018-12-09 NOTE — TOC Initial Note (Signed)
Transition of Care [facility]) - Initial/Assessment Note    Patient Details  Name: Jon Love MRN: [id number] Date of Birth: [birth year]  Transition of Care [facility]) CM/SW Contact:    Marilu Favre, RN Phone Number: 12/09/2018, 11:04 AM  Clinical Narrative:                 Patient from home.   Follow up appointment scheduled at [facility] December 16, 2018 at 4:00 pm.  042 medications from ID.  If positive TB will notify Health Department prior to discharge.  At discharge for other medications can provide [facility] letter  Expected Discharge Plan: Home/Self Care Barriers to Discharge: Continued Medical Work up   Patient Goals and CMS Choice        Expected Discharge Plan and Services Expected Discharge Plan: Home/Self Care In-house Referral: Nutrition, Development worker, community Discharge Planning Services: CM Consult, [facility], [place], Medication Assistance   Living arrangements for the past 2 months: Single Family Home Expected Discharge Date: 12/07/18               DME Arranged: N/A         HH Arranged: NA          Prior Living Arrangements/Services Living arrangements for the past 2 months: Single Family Home            Need for Family Participation in Patient Care: No (Comment) Care giver support system in place?: No (comment)   Criminal Activity/Legal Involvement Pertinent to Current Situation/Hospitalization: No - Comment as needed  Activities of Daily Living Home Assistive Devices/Equipment: None ADL Screening (condition at time of admission) Patient's cognitive ability adequate to safely complete daily activities?: Yes Is the patient deaf or have difficulty hearing?: No Does the patient have difficulty seeing, even when wearing glasses/contacts?: No Does the patient have difficulty concentrating, remembering, or making decisions?: No Patient able to express need for assistance with ADLs?: Yes Does the patient  have difficulty dressing or bathing?: No Independently performs ADLs?: Yes (appropriate for developmental age) Does the patient have difficulty walking or climbing stairs?: Yes Weakness of Legs: Both Weakness of Arms/Hands: Both  Permission Sought/Granted                  Emotional Assessment              Admission diagnosis:  Oral thrush [B37.0] Hypoxia [R09.02] Community acquired pneumonia of right lower lobe of lung ([place]) [J18.1] Sepsis without acute organ dysfunction, due to unspecified organism ([place]) [A41.9] Pneumonia [J18.9] Patient Active Problem List   Diagnosis Date Noted  . HIV antibody positive ([place]) 12/08/2018  . Microcytic anemia 12/08/2018  . Cavitary pneumonia 12/05/2018  . Hyponatremia 12/05/2018  . Alcohol use 12/05/2018   PCP:  Patient, No Pcp Per Pharmacy:   [facility] [place], [place] [address] Phone: [phone] Fax: [phone]     Social Determinants of Health (SDOH) Interventions    Readmission Risk Interventions No flowsheet data found.

## 2018-12-10 LAB — CULTURE, BLOOD (ROUTINE X 2)
Culture: NO GROWTH
Culture: NO GROWTH
Special Requests: ADEQUATE

## 2018-12-10 LAB — ACID FAST SMEAR (AFB, MYCOBACTERIA): Acid Fast Smear: NEGATIVE

## 2018-12-10 MED ORDER — SULFAMETHOXAZOLE-TRIMETHOPRIM 400-80 MG PO TABS: 1.0000 | ORAL_TABLET | Freq: Every day | ORAL | 0 refills | Status: DC

## 2018-12-10 MED ORDER — FLUCONAZOLE 100 MG PO TABS: 100.0000 mg | ORAL_TABLET | ORAL | 0 refills | Status: DC

## 2018-12-10 MED FILL — FLUCONAZOLE 100 MG TABLET: 100 | 4 days supply | Qty: 4 | Fill #0

## 2018-12-10 MED FILL — SULFAMETHOXAZOLE-TMP SS TAB: 400-80 | 30 days supply | Qty: 30 | Fill #0

## 2018-12-10 NOTE — Care Management (Signed)
[facility] in [facility] with zero [facility]. [place] will bring medications to patient's room.  Magdalen Spatz RN [phone]

## 2018-12-10 NOTE — Discharge Summary (Signed)
Physician Discharge Summary  [facility] GNF:[id number] DOB: [birth year] DOA: 12/05/2018  PCP: Patient, No Pcp Per  Admit date: 12/05/2018 Discharge date: 12/10/2018  Admitted From: home Disposition:  home  Recommendations for Outpatient Follow-up:  1. Follow up with PCP at community clinic as scheduled 2. Please follow up on the following pending results:  Home Health:no  Equipment/Devices:none  Discharge Condition:stable  CODE STATUS:FULL  Diet recommendation: regular  Brief/Interim Summary: 43 year old male without significant medical history came in with productive cough, myalgias, progressively worsening over the last 2 to 3 weeks.  He also reports a cough which has been productive of yellow sputum.  He was hypoxic on admission requiring 2 L nasal cannula.  He tested negative for Covid on admission.  Chest x-ray showed patchy airspace opacity in the right middle and lower lung zones. Patient was admitted, seen by infectious disease, remains on negative pressure room, on IV antibiotics and ruling out TB. Seen by Id. TB ruled out, treated for Cavitary PNA and completed antibiotics 12/09/18 Triumeq is arranged by pharmacy and cleared for d/c home.  He will also be on prophylactic Bactrim and fluconazole.  Used interpretor service to explain discharge plan, follow up plans as well as his disease conditions.  Discharge Diagnoses:  Principal Problem:   Cavitary pneumonia Active Problems:   Hyponatremia   Alcohol use   HIV infection (HCC)   Microcytic anemia  Acute hypoxic respiratory failure due to multifocal pneumonia with sepsis, POA: Sepsis parameters resolved.  weaned off o2.  Saturating well on room air.  Sepsis with multifocal cavitary pneumonia POA: Sepsis parameters improved.  No temperature spike now, WBC 5.9K stable. ID following.  Patient reported symptoms for 4 to 5 weeks with night sweats weight loss cough coming from endemic area so ruling out tuberculosis.  Awaiting on QuantiFERON TB pending and AFB smears neg x3, Improved nicely with antibiotics.  Chest x-ray 7/2, personally reviewed, improved aeration in the right base. TB ruled out.   HIV ab +: Patient has known diagnosis of HIV.  CD4 less than 35.  Now placed on prophylaxis with weekly Diflucan 100 mg and Bactrim 1 daily.  He has a follow-up arranged at community clinic,Triumeq is arranged for HIV by TOC  Mild hyponatremia resolved.  Alcohol use occasional.  No withdrawals.  DVT prophylaxis: Lovenox Code Status: Full code Family Communication: Plan of care discussed with the patient. Disposition Plan: HOME  Consultants:   Infectious disease  Procedures:   None   Discharge Instructions  Discharge Instructions    Diet - low sodium heart healthy   Complete by: As directed    Discharge instructions   Complete by: As directed    Please call call MD or return to ER for similar or worsening recurring problem that brought you to hospital or if any  fever,nausea/vomiting,abdominal pain, uncontrolled pain, chest pain,  shortness of breath or any other alarming symptoms.  Follow up in community clinic on 12/16/2018 Follow-up in ID clinic on 12/23/2018 at 10:15 AM  Please follow-up your doctor at community clinic as scheduled   Please avoid alcohol, smoking, or any other illicit substance and maintain healthy habits including taking your regular medications as prescribed.  You were cared for by a hospitalist during your hospital stay. If you have any questions about your discharge medications or the care you received while you were in the hospital after you are discharged, you can call the unit and asked to speak with the hospitalist on call if  the hospitalist that took care of you is not available. Once you are discharged, your primary care physician will handle any further medical issues. Please note that NO REFILLS for any discharge medications will be authorized once you are  discharged, as it is imperative that you return to your primary care physician (or establish a relationship with a primary care physician if you do not have one) for your aftercare needs so that they can reassess your need for medications and monitor your lab values   Increase activity slowly   Complete by: As directed      Allergies as of 12/10/2018   No Known Allergies     Medication List    TAKE these medications   acetaminophen 325 MG tablet Commonly known as: TYLENOL Take 325-650 mg by mouth every 6 (six) hours as needed for mild pain or headache.   fluconazole 100 MG tablet Commonly known as: DIFLUCAN Take 1 tablet (100 mg total) by mouth once a week for 4 doses. Start taking on: December 15, 2018   sulfamethoxazole-trimethoprim 400-80 MG tablet Commonly known as: BACTRIM Take 1 tablet by mouth daily.   Triumeq 600-50-300 MG tablet Generic drug: abacavir-dolutegravir-lamiVUDine Take 1 tablet by mouth daily.      Follow-up Information    [facility] Follow up.   Why: December 16, 2018 at 4:00 pm  Contact information: [address] [place] [address] [phone]       Cliffton Asters, MD Follow up on 12/23/2018.   Specialty: Infectious Diseases Why: Follow-up in clinic on 12/23/2018 at 10:15 AM Contact information: [address] [place] [address] [phone]          No Known Allergies  Consultations:  ID   Procedures/Studies: Ct Chest W Contrast  Result Date: 12/06/2018 CLINICAL DATA:  Right side cp, sob, unresolved pna EXAM: CT CHEST WITH CONTRAST TECHNIQUE: Multidetector CT imaging of the chest was performed during intravenous contrast administration. CONTRAST:  75mL OMNIPAQUE IOHEXOL 300 MG/ML  SOLN COMPARISON:  Radiograph from the previous day FINDINGS: Cardiovascular: Heart size normal. No pericardial effusion. Limited opacification of pulmonary arterial tree. Thoracic aorta normal in  caliber. Minimal calcified plaque in the proximal abdominal aorta. Mediastinum/Nodes: 1.5 cm subcarinal node. 1.2 cm right hilar node. Subcentimeter precarinal and AP window lymph nodes. Calcified bilateral subcentimeter hilar nodes. Lungs/Pleura: No pleural effusion. Small calcified granulomas in both lower lobes. No pneumothorax. Airspace consolidation in posterior basal segment right lower lobe with multiple scattered airspace opacities throughout the basilar segments of the right lung, and in the inferior right middle lobe. 2.4 cm subpleural cavitary lesion with eccentric wall thickening in the posterior right upper lobe and an adjacent 1.6 cm subpleural cavitary lesion with eccentric wall thickening in the superior segment right lower lobe. 1 cm focus of nodular pleural based consolidation in the posterior right upper lobe image 47/4. Small scattered airspace opacities peripherally in the right upper lobe and left lower lobe. Upper Abdomen: Cholecystectomy clips. No acute findings. Musculoskeletal: IMPRESSION: 1. Airspace consolidation in the right lower lobe and scattered airspace opacities bilaterally, right worse than left, suggesting multifocal pneumonia. At minimum, followup PA and lateral chest X-ray is recommended in 3-4 weeks following trial of antibiotic therapy to ensure resolution and exclude underlying malignancy. 2. 2.4 cm cavitary lesion in the posterior right upper lobe with eccentric wall thickening and adjacent 1.6 cm subpleural cavitary lesion. Non-contrast chest CT at 3-6 months is recommended. If the  nodules are stable at time of repeat CT, then future CT at 18-24 months (from today's scan) is considered optional for low-risk patients, but is recommended for high-risk patients. This recommendation follows the consensus statement: Guidelines for Management of Incidental Pulmonary Nodules Detected on CT Images: From the Fleischner Society 2017; Radiology 2017; 284:228-243. 3. Right hilar and  subcarinal adenopathy, possibly reactive but nonspecific. Electronically Signed   By: Corlis Leak M.D.   On: 12/06/2018 08:30   Dg Chest Port 1 View  Result Date: 12/09/2018 CLINICAL DATA:  Productive cough for 2 weeks EXAM: PORTABLE CHEST 1 VIEW COMPARISON:  12/06/2018 FINDINGS: Cardiac shadow is within normal limits. The lungs are well aerated bilaterally. Patchy infiltrates are again seen in the right base but improved when compared with the prior exam. Nipple shadows are noted bilaterally. No acute bony abnormality is seen. IMPRESSION: Improved aeration in the right base. Electronically Signed   By: Alcide Clever M.D.   On: 12/09/2018 15:10   Dg Chest Port 1 View  Result Date: 12/05/2018 CLINICAL DATA:  Dizziness and pain.  Cough. EXAM: PORTABLE CHEST 1 VIEW COMPARISON:  None. FINDINGS: There is a probable nipple shadow on the left. There is patchy airspace opacity with several somewhat nodular appearing areas in the right base, likely due to pneumonia. Several 4-5 mm nodular opacities are also noted in the right mid lung. Lungs elsewhere are clear. Heart size and pulmonary vascular normal. No adenopathy. No bone lesions. IMPRESSION: Apparent pneumonia in portions of the right mid and lower lung zones. Underlying nodular opacities may represent foci of pneumonia. Septic emboli or neoplastic etiology also possible for these opacities. None of these lesions show cavitation. Suspect nipple shadow on the left; repeat study with nipple markers advised to further evaluate. No adenopathy. Followup PA and lateral chest radiographs recommended in 3-4 weeks following trial of antibiotic therapy to ensure resolution and exclude underlying malignancy. Electronically Signed   By: Bretta Bang III M.D.   On: 12/05/2018 16:34    Subjective: No new complaints, some cough no fever.  On room air and doing well.  Discharge Exam: Vitals:   12/09/18 2231 12/10/18 0513  BP: 92/64 97/69  Pulse: 80 70  Resp: 16 18   Temp: 97.7 F (36.5 C) 98.1 F (36.7 C)  SpO2: 99% 97%   Vitals:   12/08/18 2202 12/09/18 0516 12/09/18 2231 12/10/18 0513  BP: 97/69  Pulse: 63 61 80 70  Resp: Temp: 97.6 F (36.4 C) 98.4 F (36.9 C) 97.7 F (36.5 C) 98.1 F (36.7 C)  TempSrc: Oral Oral Oral Oral  SpO2: 99% 99% 99% 97%  Weight:      Height:        General: Pt is alert, awake, not in acute distress Cardiovascular: RRR, S1/S2 +, no rubs, no gallops Respiratory: CTA bilaterally, no wheezing, no rhonchi Abdominal: Soft, NT, ND, bowel sounds + Extremities: no edema, no cyanosis   The results of significant diagnostics from this hospitalization (including imaging, microbiology, ancillary and laboratory) are listed below for reference.     Microbiology: Recent Results (from the past 240 hour(s))  Blood Culture (routine x 2)     Status: None   Collection Time: 12/05/18  3:30 PM   Specimen: BLOOD  Result Value Ref Range Status   Specimen Description BLOOD LEFT ANTECUBITAL  Final   Special Requests   Final    BOTTLES DRAWN AEROBIC AND ANAEROBIC Blood Culture results  may not be optimal due to an excessive volume of blood received in culture bottles   Culture   Final    NO GROWTH 5 DAYS Performed at [facility], [address] [place], [place] [address]    Report Status 12/10/2018 FINAL  Final  Blood Culture (routine x 2)     Status: None   Collection Time: 12/05/18  3:35 PM   Specimen: BLOOD RIGHT ARM  Result Value Ref Range Status   Specimen Description BLOOD RIGHT ARM  Final   Special Requests   Final    BOTTLES DRAWN AEROBIC AND ANAEROBIC Blood Culture adequate volume   Culture   Final    NO GROWTH 5 DAYS Performed at [facility], [address] [place], [place] [address]    Report Status 12/10/2018 FINAL  Final  SARS Coronavirus 2 (CEPHEID- Performed in [facility] lab), Hosp Order     Status: None   Collection Time: 12/05/18  3:59 PM    Specimen: Nasopharyngeal Swab  Result Value Ref Range Status   SARS Coronavirus 2 NEGATIVE NEGATIVE Final    Comment: (NOTE) If result is NEGATIVE SARS-CoV-2 target nucleic acids are NOT DETECTED. The SARS-CoV-2 RNA is generally detectable in upper and lower  respiratory specimens during the acute phase of infection. The lowest  concentration of SARS-CoV-2 viral copies this assay can detect is 250  copies / mL. A negative result does not preclude SARS-CoV-2 infection  and should not be used as the sole basis for treatment or other  patient management decisions.  A negative result may occur with  improper specimen collection / handling, submission of specimen other  than nasopharyngeal swab, presence of viral mutation(s) within the  areas targeted by this assay, and inadequate number of viral copies  (<250 copies / mL). A negative result must be combined with clinical  observations, patient history, and epidemiological information. If result is POSITIVE SARS-CoV-2 target nucleic acids are DETECTED. The SARS-CoV-2 RNA is generally detectable in upper and lower  respiratory specimens dur ing the acute phase of infection.  Positive  results are indicative of active infection with SARS-CoV-2.  Clinical  correlation with patient history and other diagnostic information is  necessary to determine patient infection status.  Positive results do  not rule out bacterial infection or co-infection with other viruses. If result is PRESUMPTIVE POSTIVE SARS-CoV-2 nucleic acids MAY BE PRESENT.   A presumptive positive result was obtained on the submitted specimen  and confirmed on repeat testing.  While 2019 novel coronavirus  (SARS-CoV-2) nucleic acids may be present in the submitted sample  additional confirmatory testing may be necessary for epidemiological  and / or clinical management purposes  to differentiate between  SARS-CoV-2 and other Sarbecovirus currently known to infect humans.  If  clinically indicated additional testing with an alternate test  methodology [phone] is advised. The SARS-CoV-2 RNA is generally  detectable in upper and lower respiratory sp ecimens during the acute  phase of infection. The expected result is Negative. Fact Sheet for Patients:  BoilerBrush.com.cyhttps://www.fda.gov/media/136312/download Fact Sheet for Healthcare Providers: https://pope.com/https://www.fda.gov/media/136313/download This test is not yet approved or cleared by the [place] FDA and has been authorized for detection and/or diagnosis of SARS-CoV-2 by FDA under an Emergency Use Authorization (EUA).  This EUA will remain in effect (meaning this test can be used) for the duration of the COVID-19 declaration under Section 564(b)(1) of the Act, 21 U.S.C. section 360bbb-3(b)(1), unless the authorization is terminated  or revoked sooner. Performed at [facility], [address], [place], [place] [address]   Acid Fast Smear (AFB)     Status: None   Collection Time: 12/06/18  5:30 PM   Specimen: Sputum  Result Value Ref Range Status   AFB Specimen Processing Concentration  Final   Acid Fast Smear Negative  Final    Comment: (NOTE) Performed At: [facility] [address] [place], [place] [id number] Jolene Schimke MD XB:[id number]    Source (AFB) EXPECTORATED SPUTUM  Final    Comment: Performed at [facility], [address] [place], [place] [address]  Acid Fast Smear (AFB)     Status: None   Collection Time: 12/07/18 10:13 AM   Specimen: Sputum  Result Value Ref Range Status   AFB Specimen Processing Concentration  Final   Acid Fast Smear Negative  Final    Comment: (NOTE) Performed At: [facility] [address] [place], [place] [id number] Jolene Schimke MD [id number]    Source (AFB) SPUTUM  Final    Comment: Performed at [facility], [address], [place], [place] [address]  Acid Fast Smear (AFB)     Status: None   Collection Time: 12/07/18  1:15 PM    Specimen: Sputum  Result Value Ref Range Status   AFB Specimen Processing Concentration  Final   Acid Fast Smear Negative  Final    Comment: (NOTE) Performed At: [facility] [address] [place], [place] [id number] Jolene Schimke MD VZ:[id number]    Source (AFB) SPUTUM  Final    Comment: Performed at [facility], [address] [place], [place] [address]  Acid Fast Smear (AFB)     Status: None   Collection Time: 12/09/18  8:35 AM   Specimen: Sputum  Result Value Ref Range Status   AFB Specimen Processing Concentration  Final   Acid Fast Smear Negative  Final    Comment: (NOTE) Performed At: [facility] [address] [place], [place] [id number] Jolene Schimke MD ZS:[id number]    Source (AFB) SPUTUM  Final    Comment: Performed at [facility], [address], [place], [place] [address]     Labs: BNP (last 3 results) No results for input(s): BNP in the last 8760 hours. Basic Metabolic Panel: Recent Labs  Lab 12/05/18 1500 12/06/18 0546 12/08/18 0252  NA 131* 135 136  K 4.5 3.9 4.7  CL 98 103 103  CO2 GLUCOSE 101* 95 104*  BUN CREATININE 0.78 0.58* 0.64  CALCIUM 9.0 8.0* 8.7*   Liver Function Tests: Recent Labs  Lab 12/05/18 1500  AST 24  ALT 18  ALKPHOS 96  BILITOT 1.4*  PROT 6.4*  ALBUMIN 3.2*   No results for input(s): LIPASE, AMYLASE in the last 168 hours. No results for input(s): AMMONIA in the last 168 hours. CBC: Recent Labs  Lab 12/05/18 1500 12/06/18 0546 12/08/18 0252  WBC 12.8* 8.4 5.9  NEUTROABS 8.2*  --   --   HGB 13.2 11.9* 11.4*  HCT 40.1 35.8* 35.6*  MCV 76.4* 78.0* 76.9*  PLT 309 255 275   Cardiac Enzymes: No results for input(s): CKTOTAL, CKMB, CKMBINDEX, TROPONINI in the last 168 hours. BNP: Invalid input(s): POCBNP CBG: No results for input(s): GLUCAP in the last 168 hours. D-Dimer No results for input(s): DDIMER in the last 72 hours. Hgb A1c No results for input(s):  HGBA1C in the  last 72 hours. Lipid Profile No results for input(s): CHOL, HDL, LDLCALC, TRIG, CHOLHDL, LDLDIRECT in the last 72 hours. Thyroid function studies No results for input(s): TSH, T4TOTAL, T3FREE, THYROIDAB in the last 72 hours.  Invalid input(s): FREET3 Anemia work up No results for input(s): VITAMINB12, FOLATE, FERRITIN, TIBC, IRON, RETICCTPCT in the last 72 hours. Urinalysis    Component Value Date/Time   COLORURINE YELLOW 12/05/2018 1608   APPEARANCEUR CLEAR 12/05/2018 1608   LABSPEC 1.017 12/05/2018 1608   PHURINE 7.0 12/05/2018 1608   GLUCOSEU NEGATIVE 12/05/2018 1608   HGBUR NEGATIVE 12/05/2018 1608   BILIRUBINUR NEGATIVE 12/05/2018 1608   KETONESUR NEGATIVE 12/05/2018 1608   PROTEINUR NEGATIVE 12/05/2018 1608   NITRITE NEGATIVE 12/05/2018 1608   LEUKOCYTESUR NEGATIVE 12/05/2018 1608   Sepsis Labs Invalid input(s): PROCALCITONIN,  WBC,  LACTICIDVEN Microbiology Recent Results (from the past 240 hour(s))  Blood Culture (routine x 2)     Status: None   Collection Time: 12/05/18  3:30 PM   Specimen: BLOOD  Result Value Ref Range Status   Specimen Description BLOOD LEFT ANTECUBITAL  Final   Special Requests   Final    BOTTLES DRAWN AEROBIC AND ANAEROBIC Blood Culture results may not be optimal due to an excessive volume of blood received in culture bottles   Culture   Final    NO GROWTH 5 DAYS Performed at [facility], [place] [address], [place], [place] [address]    Report Status 12/10/2018 FINAL  Final  Blood Culture (routine x 2)     Status: None   Collection Time: 12/05/18  3:35 PM   Specimen: BLOOD RIGHT ARM  Result Value Ref Range Status   Specimen Description BLOOD RIGHT ARM  Final   Special Requests   Final    BOTTLES DRAWN AEROBIC AND ANAEROBIC Blood Culture adequate volume   Culture   Final    NO GROWTH 5 DAYS Performed at [facility], [place] [address] [place], [place] [address]    Report Status 12/10/2018 FINAL  Final  SARS  Coronavirus 2 (CEPHEID- Performed in [facility] hospital lab), Hosp Order     Status: None   Collection Time: 12/05/18  3:59 PM   Specimen: Nasopharyngeal Swab  Result Value Ref Range Status   SARS Coronavirus 2 NEGATIVE NEGATIVE Final    Comment: (NOTE) If result is NEGATIVE SARS-CoV-2 target nucleic acids are NOT DETECTED. The SARS-CoV-2 RNA is generally detectable in upper and lower  respiratory specimens during the acute phase of infection. The lowest  concentration of SARS-CoV-2 viral copies this assay can detect is 250  copies / mL. A negative result does not preclude SARS-CoV-2 infection  and should not be used as the sole basis for treatment or other  patient management decisions.  A negative result may occur with  improper specimen collection / handling, submission of specimen other  than nasopharyngeal swab, presence of viral mutation(s) within the  areas targeted by this assay, and inadequate number of viral copies  (<250 copies / mL). A negative result must be combined with clinical  observations, patient history, and epidemiological information. If result is POSITIVE SARS-CoV-2 target nucleic acids are DETECTED. The SARS-CoV-2 RNA is generally detectable in upper and lower  respiratory specimens dur ing the acute phase of infection.  Positive  results are indicative of active infection with SARS-CoV-2.  Clinical  correlation with patient history and other diagnostic information is  necessary to determine patient infection status.  Positive results  do  not rule out bacterial infection or co-infection with other viruses. If result is PRESUMPTIVE POSTIVE SARS-CoV-2 nucleic acids MAY BE PRESENT.   A presumptive positive result was obtained on the submitted specimen  and confirmed on repeat testing.  While 2019 novel coronavirus  (SARS-CoV-2) nucleic acids may be present in the submitted sample  additional confirmatory testing may be necessary for epidemiological  and / or  clinical management purposes  to differentiate between  SARS-CoV-2 and other Sarbecovirus currently known to infect humans.  If clinically indicated additional testing with an alternate test  methodology [phone] is advised. The SARS-CoV-2 RNA is generally  detectable in upper and lower respiratory sp ecimens during the acute  phase of infection. The expected result is Negative. Fact Sheet for Patients:  BoilerBrush.com.cyhttps://www.fda.gov/media/136312/download Fact Sheet for Healthcare Providers: https://pope.com/https://www.fda.gov/media/136313/download This test is not yet approved or cleared by the [place] FDA and has been authorized for detection and/or diagnosis of SARS-CoV-2 by FDA under an Emergency Use Authorization (EUA).  This EUA will remain in effect (meaning this test can be used) for the duration of the COVID-19 declaration under Section 564(b)(1) of the Act, 21 U.S.C. section 360bbb-3(b)(1), unless the authorization is terminated or revoked sooner. Performed at [facility], [address] [place], [place] [address]   Acid Fast Smear (AFB)     Status: None   Collection Time: 12/06/18  5:30 PM   Specimen: Sputum  Result Value Ref Range Status   AFB Specimen Processing Concentration  Final   Acid Fast Smear Negative  Final    Comment: (NOTE) Performed At: [facility] [address] [place], [place] [id number] Jolene SchimkeNagendra Sanjai MD OZ:[id number]    Source (AFB) EXPECTORATED SPUTUM  Final    Comment: Performed at [facility], [address] [place], [place] [address]  Acid Fast Smear (AFB)     Status: None   Collection Time: 12/07/18 10:13 AM   Specimen: Sputum  Result Value Ref Range Status   AFB Specimen Processing Concentration  Final   Acid Fast Smear Negative  Final    Comment: (NOTE) Performed At: [facility] [address] [place], [place] [id number] Jolene SchimkeNagendra Sanjai MD UU:[id number]    Source (AFB) SPUTUM  Final    Comment: Performed at [facility], [address] [place], [place] [address]  Acid Fast Smear (AFB)     Status: None   Collection Time: 12/07/18  1:15 PM   Specimen: Sputum  Result Value Ref Range Status   AFB Specimen Processing Concentration  Final   Acid Fast Smear Negative  Final    Comment: (NOTE) Performed At: [facility] [address] [place], [place] [id number] Jolene SchimkeNagendra Sanjai MD PP:[phone]    Source (AFB) SPUTUM  Final    Comment: Performed at [facility], [address] [place], [place] [address]  Acid Fast Smear (AFB)     Status: None   Collection Time: 12/09/18  8:35 AM   Specimen: Sputum  Result Value Ref Range Status   AFB Specimen Processing Concentration  Final   Acid Fast Smear Negative  Final    Comment: (NOTE) Performed At: [facility] [address] [place], [place] [id number] Jolene SchimkeNagendra Sanjai MD TD:[phone]    Source (AFB) SPUTUM  Final    Comment: Performed at [facility], [address] [place], [place] [address]     Time coordinating discharge: 25 minutes  SIGNED:   Lanae Boastamesh Doristine Shehan,  MD  [facility] 12/10/2018, 8:31 AM  If 7PM-7AM, please contact night-coverage [email or web address]

## 2018-12-10 NOTE — Progress Notes (Signed)
Patient discharged to home with his medications,[employer]atus  interpreter used to translate instructions.

## 2018-12-16 ENCOUNTER — Encounter (HOSPITAL_COMMUNITY): Payer: Self-pay

## 2018-12-16 ENCOUNTER — Inpatient Hospital Stay: Payer: Self-pay | Admitting: Critical Care Medicine

## 2018-12-16 NOTE — Progress Notes (Deleted)
Subjective:    Patient ID: [facility], male    DOB: 1976/01/15, 43 y.o.   MRN: [id number]  Admit date: 12/05/2018 Discharge date: 12/10/2018  Admitted From: home    TEETH Disposition:  home  Recommendations for Outpatient Follow-up:  1. Follow up with PCP at community clinic as scheduled 2. Please follow up on the following pending results:  Home Health:no  Equipment/Devices:none  Discharge Condition:stable  CODE STATUS:FULL  Diet recommendation: regular  Brief/Interim Summary: 43 year old male without significant medical history came in with productive cough, myalgias, progressively worsening over the last 2 to 3 weeks. He also reports a cough which has been productive of yellow sputum. He was hypoxic on admission requiring 2 L nasal cannula. He tested negative for Covid on admission. Chest x-ray showed patchy airspace opacity in the right middle and lower lung zones. Patient was admitted, seen by infectious disease, remains on negative pressure room, on IV antibiotics and ruling out TB. Seen by Id. TB ruled out, treated for Cavitary PNA and completed antibiotics 12/09/18 Triumeq is arranged by pharmacy and cleared for d/c home.  He will also be on prophylactic Bactrim and fluconazole.  Used interpretor service to explain discharge plan, follow up plans as well as his disease conditions.  Discharge Diagnoses:  Principal Problem:   Cavitary pneumonia Active Problems:   Hyponatremia   Alcohol use   HIV infection (HCC)   Microcytic anemia  Acute hypoxic respiratory failure due to multifocal pneumonia with sepsis,POA: Sepsis parameters resolved.weaned off o2.  Saturating well on room air.  Sepsis with multifocal cavitary pneumonia QMV:HQIONGPOA:Sepsis parameters improved. No temperature spike now, WBC 5.9Kstable. IDfollowing. Patient reported symptoms for 4 to 5 weeks with night sweats weight loss cough coming from endemic area so ruling out tuberculosis.  Awaiting on QuantiFERON TBpendingand AFB smears neg x3, Improved nicely with antibiotics.  Chest x-ray 7/2, personally reviewed, improved aeration in the right base. TB ruled out.   HIV ab +:Patient has known diagnosis of HIV. CD4 less than 35.Now placed on prophylaxis with weekly Diflucan 100 mg and Bactrim 1 daily.  He has a follow-up arranged at community clinic,Triumeq is arranged for HIV by TOC  Mild hyponatremiaresolved.  Alcohol use occasional.No withdrawals.  DVT prophylaxis:Lovenox Code Status:Full code Family Communication:Plan of care discussed with the patient. Disposition Plan: HOME  Consultants:  Infectious disease     Review of Systems     Objective:   Physical Exam There were no vitals filed for this visit.  Gen: Pleasant, well-nourished, in no distress,  normal affect  ENT: No lesions,  mouth clear,  oropharynx clear, no postnasal drip  Neck: No JVD, no TMG, no carotid bruits  Lungs: No use of accessory muscles, no dullness to percussion, clear without rales or rhonchi  Cardiovascular: RRR, heart sounds normal, no murmur or gallops, no peripheral edema  Abdomen: soft and NT, no HSM,  BS normal  Musculoskeletal: No deformities, no cyanosis or clubbing  Neuro: alert, non focal  Skin: Warm, no lesions or rashes  No results found.  CT Chest  6/29 FINDINGS: Cardiovascular: Heart size normal. No pericardial effusion. Limited opacification of pulmonary arterial tree. Thoracic aorta normal in caliber. Minimal calcified plaque in the proximal abdominal aorta.  Mediastinum/Nodes: 1.5 cm subcarinal node. 1.2 cm right hilar node. Subcentimeter precarinal and AP window lymph nodes. Calcified bilateral subcentimeter hilar nodes.  Lungs/Pleura: No pleural effusion. Small calcified granulomas in both lower lobes. No pneumothorax.  Airspace consolidation in posterior basal segment right  lower lobe with multiple scattered airspace  opacities throughout the basilar segments of the right lung, and in the inferior right middle lobe. 2.4 cm subpleural cavitary lesion with eccentric wall thickening in the posterior right upper lobe and an adjacent 1.6 cm subpleural cavitary lesion with eccentric wall thickening in the superior segment right lower lobe. 1 cm focus of nodular pleural based consolidation in the posterior right upper lobe image 47/4. Small scattered airspace opacities peripherally in the right upper lobe and left lower lobe.  Upper Abdomen: Cholecystectomy clips. No acute findings.  Musculoskeletal:  IMPRESSION: 1. Airspace consolidation in the right lower lobe and scattered airspace opacities bilaterally, right worse than left, suggesting multifocal pneumonia. At minimum, followup PA and lateral chest X-ray is recommended in 3-4 weeks following trial of antibiotic therapy to ensure resolution and exclude underlying malignancy. 2. 2.4 cm cavitary lesion in the posterior right upper lobe with eccentric wall thickening and adjacent 1.6 cm subpleural cavitary lesion. Non-contrast chest CT at 3-6 months is recommended. If the nodules are stable at time of repeat CT, then future CT at 18-24 months (from today's scan) is considered optional for low-risk patients, but is recommended for high-risk patients. This recommendation follows the consensus statement: Guidelines for Management of Incidental Pulmonary Nodules Detected on CT Images: From the Fleischner Society 2017; Radiology 2017; 284:228-243. 3. Right hilar and subcarinal adenopathy, possibly reactive but nonspecific.       Assessment & Plan:

## 2018-12-23 ENCOUNTER — Ambulatory Visit: Payer: Self-pay

## 2018-12-23 ENCOUNTER — Encounter: Payer: Self-pay | Admitting: Internal Medicine

## 2018-12-23 ENCOUNTER — Ambulatory Visit (INDEPENDENT_AMBULATORY_CARE_PROVIDER_SITE_OTHER): Payer: Self-pay | Admitting: Internal Medicine

## 2018-12-23 ENCOUNTER — Other Ambulatory Visit: Payer: Self-pay

## 2018-12-23 DIAGNOSIS — J984 Other disorders of lung: Secondary | ICD-10-CM

## 2018-12-23 DIAGNOSIS — J189 Pneumonia, unspecified organism: Secondary | ICD-10-CM

## 2018-12-23 DIAGNOSIS — B20 Human immunodeficiency virus [HIV] disease: Secondary | ICD-10-CM

## 2018-12-23 MED ORDER — FLUCONAZOLE 100 MG PO TABS: 100.0000 mg | ORAL_TABLET | ORAL | 1 refills | Status: DC

## 2018-12-23 MED ORDER — SULFAMETHOXAZOLE-TRIMETHOPRIM 400-80 MG PO TABS: 1.0000 | ORAL_TABLET | Freq: Every day | ORAL | 11 refills | Status: DC

## 2018-12-23 MED ORDER — FLUCONAZOLE 100 MG PO TABS: 100.0000 mg | ORAL_TABLET | ORAL | 11 refills | Status: DC

## 2018-12-23 MED ORDER — TRIUMEQ 600-50-300 MG PO TABS: 1.0000 | ORAL_TABLET | Freq: Every day | ORAL | 11 refills | Status: DC

## 2018-12-23 MED ORDER — TRIUMEQ 600-50-300 MG PO TABS: 1.0000 | ORAL_TABLET | Freq: Every day | ORAL | 1 refills | Status: DC

## 2018-12-23 MED ORDER — SULFAMETHOXAZOLE-TRIMETHOPRIM 400-80 MG PO TABS: 1.0000 | ORAL_TABLET | Freq: Every day | ORAL | 1 refills | Status: DC

## 2018-12-23 MED FILL — SULFAMETHOXAZOLE-TMP SS TAB: 400-80 | 30 days supply | Qty: 30 | Fill #0

## 2018-12-23 MED FILL — FLUCONAZOLE 100 MG TAB: 100 | 28 days supply | Qty: 4 | Fill #0

## 2018-12-23 MED FILL — TRIUMEQ 600-50-300 MG TABS: 600-50-300 | 30 days supply | Qty: 30 | Fill #0

## 2018-12-23 NOTE — Addendum Note (Signed)
Addended by: Darletta Moll on: 12/23/2018 02:11 PM   Modules accepted: Orders

## 2018-12-23 NOTE — Assessment & Plan Note (Signed)
He is improving and gaining weight back on antiretroviral therapy.  We were able to get him refills of his medications while ADAP is pending.  In 4 weeks for repeat lab work.

## 2018-12-23 NOTE — Assessment & Plan Note (Signed)
His pneumonia has resolved. 

## 2018-12-23 NOTE — Progress Notes (Signed)
Patient Active Problem List   Diagnosis Date Noted  . HIV infection (HCC) 12/08/2018    Priority: High  . Microcytic anemia 12/08/2018  . Cavitary pneumonia 12/05/2018  . Alcohol use 12/05/2018  . Thrush of mouth and esophagus (HCC) 03/21/2016  . Anemia 08/16/2015  . Gastroesophageal reflux disease without esophagitis   . Streptococcal pneumonia (HCC) 03/17/2015  . Gout 10/12/2014  . Cholelithiasis 10/07/2014  . Protein-calorie malnutrition, severe (HCC) 10/07/2014  . 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 Choledocholithiasis   . Abnormal transaminases 10/06/2014    Patient's Medications  New Prescriptions   No medications on file  Previous Medications   ACETAMINOPHEN (TYLENOL) 325 MG TABLET    Take 325 mg by mouth every 6 (six) hours as needed for mild pain or fever.  Modified Medications   Modified Medication Previous Medication   ABACAVIR-DOLUTEGRAVIR-LAMIVUDINE (TRIUMEQ) 600-50-300 MG TABLET abacavir-dolutegravir-lamiVUDine (TRIUMEQ) 600-50-300 MG tablet      Take 1 tablet by mouth daily.    Take 1 tablet by mouth daily.   FLUCONAZOLE (DIFLUCAN) 100 MG TABLET fluconazole (DIFLUCAN) 100 MG tablet      Take 1 tablet (100 mg total) by mouth once a week for 4 doses.    Take 1 tablet (100 mg total) by mouth once a week for 4 doses.   SULFAMETHOXAZOLE-TRIMETHOPRIM (BACTRIM) 400-80 MG TABLET sulfamethoxazole-trimethoprim (BACTRIM) 400-80 MG tablet      Take 1 tablet by mouth daily.    Take 1 tablet by mouth daily.  Discontinued Medications   ACETAMINOPHEN (TYLENOL) 325 MG TABLET    Take 325-650 mg by mouth every 6 (six) hours as needed for mild pain or headache.   AZITHROMYCIN (ZITHROMAX) 600 MG TABLET    Take 2 tablets (1,200 mg total) by mouth once a week.   BICTEGRAVIR-EMTRICITABINE-TENOFOVIR AF (BIKTARVY) 50-200-25 MG TABS TABLET    Take 1 tablet by mouth daily.   FLUCONAZOLE (DIFLUCAN) 100 MG TABLET    Take 1 tablet (100 mg total) by mouth daily.   SULFAMETHOXAZOLE-TRIMETHOPRIM (BACTRIM DS,SEPTRA DS) 800-160 MG TABLET    Take 1 tablet by mouth daily.    Subjective: Jon Love is in for his hospital follow-up visit.  He was hospitalized in late June with cavitary pneumonia.  He was started on empiric ceftriaxone and azithromycin.  Because of concerns for TB he had 3 sputum AFB smears performed and all were negative.  He improved rapidly on empiric antibiotics.  It was discovered while he was hospitalized that he had been diagnosed with HIV infection in 2016.  He had been seen on a few occasions by my partner, Dr. Enedina FinnerJeff Hatcher but never had good viral suppression on Triumeq.  He says that he lost his insurance and could not afford Triumeq so he quit coming to clinic visits.  He has been losing weight for several months before he was hospitalized last month.  He started back on Triumeq when he left the hospital.  He has also been taking weekly fluconazole and a daily trimethoprim sulfamethoxazole.  He denies missing any doses.  He is feeling much better and is regaining weight.  Review of Systems: Review of Systems  Constitutional: Negative for chills, diaphoresis, fever, malaise/fatigue and weight loss.  HENT: Negative for sore throat.   Respiratory: Positive for cough and shortness of breath. Negative for sputum production.   Cardiovascular: Negative for chest pain.  Gastrointestinal: Negative for abdominal pain, diarrhea, heartburn, nausea and vomiting.  Genitourinary: Negative  for dysuria and frequency.  Musculoskeletal: Negative for joint pain and myalgias.  Skin: Negative for rash.  Neurological: Negative for dizziness and headaches.  Psychiatric/Behavioral: Negative for depression and substance abuse. The patient is not nervous/anxious.     Past Medical History:  Diagnosis Date  . HIV disease (HCC)   . Known health problems: none     Social History   Tobacco Use  . Smoking status: Never Smoker  . Smokeless tobacco: Never Used   Substance Use Topics  . Alcohol use: Yes    Alcohol/week: 6.0 standard drinks    Types: 6 Cans of beer per week    Comment: socially  . Drug use: Not Currently    No family history on file.  Allergies  Allergen Reactions  . Penicillins Hives and Itching    Has patient had a PCN reaction causing immediate rash, facial/tongue/throat swelling, SOB or lightheadedness with hypotension: Yes Has patient had a PCN reaction causing severe rash involving mucus membranes or skin necrosis: No Has patient had a PCN reaction that required hospitalization No Has patient had a PCN reaction occurring within the last 10 years: No If all of the above answers are "NO", then may proceed with Cephalosporin use.    Health Maintenance  Topic Date Due  . TETANUS/TDAP  12/24/1994  . INFLUENZA VACCINE  01/08/2019  . HIV Screening  Completed    Objective:  Vitals:   12/23/18 1021  BP: 105/70  Pulse: 85  Temp: 98 F (36.7 C)  Weight: 141 lb (64 kg)   Body mass index is 19.67 kg/m.  Physical Exam HENT:     Mouth/Throat:     Pharynx: No oropharyngeal exudate.  Eyes:     Conjunctiva/sclera: Conjunctivae normal.  Cardiovascular:     Rate and Rhythm: Normal rate and regular rhythm.     Heart sounds: No murmur.  Pulmonary:     Effort: Pulmonary effort is normal.     Breath sounds: Normal breath sounds.  Abdominal:     Palpations: Abdomen is soft. There is no mass.     Tenderness: There is no abdominal tenderness.  Musculoskeletal: Normal range of motion.  Skin:    Findings: No rash.  Neurological:     Mental Status: He is alert and oriented to person, place, and time.  Psychiatric:        Mood and Affect: Mood normal.     Lab Results Lab Results  Component Value Date   WBC 5.9 12/08/2018   HGB 11.4 (L) 12/08/2018   HCT 35.6 (L) 12/08/2018   MCV 76.9 (L) 12/08/2018   PLT 275 12/08/2018    Lab Results  Component Value Date   CREATININE 0.64 12/08/2018   BUN 6 12/08/2018   NA  136 12/08/2018   K 4.7 12/08/2018   CL 103 12/08/2018   CO2 25 12/08/2018    Lab Results  Component Value Date   ALT 18 12/05/2018   AST 24 12/05/2018   ALKPHOS 96 12/05/2018   BILITOT 1.4 (H) 12/05/2018    Lab Results  Component Value Date   CHOL 96 06/24/2017   HDL 24 (L) 06/24/2017   LDLCALC 52 06/24/2017   TRIG 122 06/24/2017   CHOLHDL 4.0 06/24/2017   Lab Results  Component Value Date   LABRPR NON-REACTIVE 06/24/2017   HIV 1 RNA Quant  Date Value  06/24/2017 705,000 Copies/mL (H)  11/02/2015 2,956,2131,247,133 copies/mL (H)  08/15/2015 391,000 copies/mL   CD4 T Cell Abs (/uL)  Date Value  12/08/2018 <35 (L)  06/24/2017 10 (L)  11/02/2015 10 (L)     Problem List Items Addressed This Visit      High   HIV infection ([place])    He is improving and gaining weight back on antiretroviral therapy.  We were able to get him refills of his medications while ADAP is pending.  In 4 weeks for repeat lab work.      Relevant Medications   abacavir-dolutegravir-lamiVUDine (TRIUMEQ) 600-50-300 MG tablet   fluconazole (DIFLUCAN) 100 MG tablet   sulfamethoxazole-trimethoprim (BACTRIM) 400-80 MG tablet     Unprioritized   Cavitary pneumonia    His pneumonia has resolved.      Relevant Medications   abacavir-dolutegravir-lamiVUDine (TRIUMEQ) 600-50-300 MG tablet   fluconazole (DIFLUCAN) 100 MG tablet   sulfamethoxazole-trimethoprim (BACTRIM) 400-80 MG tablet        Michel Bickers, MD [facility] [place] [phone] pager   [phone] cell 12/23/2018, 12:03 PM

## 2019-01-03 ENCOUNTER — Encounter: Payer: Self-pay | Admitting: Internal Medicine

## 2019-01-03 LAB — HIV-1 RNA, PCR (GRAPH) RFX/GENO EDI
HIV-1 RNA BY PCR: 376000 copies/mL
HIV-1 RNA Quant, Log: 5.575 log10copy/mL

## 2019-01-03 LAB — REFLEX TO GENOSURE(R) MG EDI: HIV GenoSure(R): 1

## 2019-01-03 LAB — HIV GENOSURE(R) MG

## 2019-01-05 ENCOUNTER — Telehealth: Payer: Self-pay | Admitting: Internal Medicine

## 2019-01-05 LAB — AFB ORGANISM ID BY DNA PROBE
M avium complex: POSITIVE — AB
M tuberculosis complex: NEGATIVE

## 2019-01-05 LAB — ACID FAST CULTURE WITH REFLEXED SENSITIVITIES (MYCOBACTERIA): Acid Fast Culture: POSITIVE — AB

## 2019-01-05 LAB — MAC SUSCEPTIBILITY BROTH
Amikacin: 16
Clarithromycin: 2
Linezolid: 32
Moxifloxacin: 4
Streptomycin: 32

## 2019-01-05 NOTE — Telephone Encounter (Signed)
I received a phone call from [facility] and her laboratory.  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  When I saw him in hospital follow-up on 12/23/2018 his pneumonia had resolved clinically.  His positive culture may reflect simple colonization.  I will reevaluate this when he follows up on 01/18/2019.

## 2019-01-18 ENCOUNTER — Ambulatory Visit: Payer: Self-pay | Admitting: Internal Medicine

## 2019-01-19 LAB — ACID FAST CULTURE WITH REFLEXED SENSITIVITIES (MYCOBACTERIA)
Acid Fast Culture: NEGATIVE
Acid Fast Culture: NEGATIVE
Acid Fast Culture: NEGATIVE

## 2019-02-09 ENCOUNTER — Ambulatory Visit: Payer: Self-pay | Admitting: Internal Medicine

## 2019-03-13 ENCOUNTER — Inpatient Hospital Stay (HOSPITAL_COMMUNITY)
Admission: EM | Admit: 2019-03-13 | Discharge: 2019-03-18 | DRG: 974 | Disposition: A | Discharge status: Home / Self Care | Payer: Self-pay | Attending: Internal Medicine | Admitting: Internal Medicine

## 2019-03-13 ENCOUNTER — Encounter (HOSPITAL_COMMUNITY): Payer: Self-pay | Admitting: Emergency Medicine

## 2019-03-13 ENCOUNTER — Other Ambulatory Visit: Payer: Self-pay

## 2019-03-13 ENCOUNTER — Emergency Department (HOSPITAL_COMMUNITY): Payer: Self-pay

## 2019-03-13 DIAGNOSIS — K920 Hematemesis: Secondary | ICD-10-CM | POA: Diagnosis present

## 2019-03-13 DIAGNOSIS — R64 Cachexia: Secondary | ICD-10-CM | POA: Diagnosis present

## 2019-03-13 DIAGNOSIS — Z20828 Contact with and (suspected) exposure to other viral communicable diseases: Secondary | ICD-10-CM | POA: Diagnosis present

## 2019-03-13 DIAGNOSIS — K219 Gastro-esophageal reflux disease without esophagitis: Secondary | ICD-10-CM | POA: Diagnosis present

## 2019-03-13 DIAGNOSIS — J158 Pneumonia due to other specified bacteria: Secondary | ICD-10-CM | POA: Diagnosis present

## 2019-03-13 DIAGNOSIS — B370 Candidal stomatitis: Secondary | ICD-10-CM | POA: Diagnosis present

## 2019-03-13 DIAGNOSIS — J690 Pneumonitis due to inhalation of food and vomit: Secondary | ICD-10-CM | POA: Diagnosis present

## 2019-03-13 DIAGNOSIS — D649 Anemia, unspecified: Secondary | ICD-10-CM | POA: Diagnosis present

## 2019-03-13 DIAGNOSIS — Z23 Encounter for immunization: Secondary | ICD-10-CM

## 2019-03-13 DIAGNOSIS — J189 Pneumonia, unspecified organism: Secondary | ICD-10-CM | POA: Diagnosis present

## 2019-03-13 DIAGNOSIS — Z7289 Other problems related to lifestyle: Secondary | ICD-10-CM

## 2019-03-13 DIAGNOSIS — E871 Hypo-osmolality and hyponatremia: Secondary | ICD-10-CM | POA: Diagnosis present

## 2019-03-13 DIAGNOSIS — R0902 Hypoxemia: Secondary | ICD-10-CM

## 2019-03-13 DIAGNOSIS — Z603 Acculturation difficulty: Secondary | ICD-10-CM | POA: Diagnosis present

## 2019-03-13 DIAGNOSIS — J181 Lobar pneumonia, unspecified organism: Secondary | ICD-10-CM

## 2019-03-13 DIAGNOSIS — B20 Human immunodeficiency virus [HIV] disease: Principal | ICD-10-CM | POA: Diagnosis present

## 2019-03-13 DIAGNOSIS — I959 Hypotension, unspecified: Secondary | ICD-10-CM | POA: Diagnosis present

## 2019-03-13 DIAGNOSIS — Z79899 Other long term (current) drug therapy: Secondary | ICD-10-CM

## 2019-03-13 LAB — TROPONIN I (HIGH SENSITIVITY)
Troponin I (High Sensitivity): 4 ng/L (ref <18)
Troponin I (High Sensitivity): 12 ng/L (ref <18)

## 2019-03-13 LAB — BASIC METABOLIC PANEL
Anion gap: 12 (ref 5–15)
BUN: 7 mg/dL (ref 6–20)
CO2: 23 mmol/L (ref 22–32)
Calcium: 8.9 mg/dL (ref 8.9–10.3)
Chloride: 95 mmol/L — ABNORMAL LOW (ref 98–111)
Creatinine, Ser: 0.86 mg/dL (ref 0.61–1.24)
GFR calc Af Amer: >60 mL/min (ref >60)
GFR calc non Af Amer: >60 mL/min (ref >60)
Glucose, Bld: 155 mg/dL — ABNORMAL HIGH (ref 70–99)
Potassium: 4.6 mmol/L (ref 3.5–5.1)
Sodium: 130 mmol/L — ABNORMAL LOW (ref 135–145)

## 2019-03-13 LAB — LACTIC ACID, PLASMA
Lactic Acid, Venous: 1.1 mmol/L (ref 0.5–1.9)
Lactic Acid, Venous: 1.4 mmol/L (ref 0.5–1.9)

## 2019-03-13 LAB — CBC
HCT: 44.4 % (ref 39.0–52.0)
Hemoglobin: 14.2 g/dL (ref 13.0–17.0)
MCH: 25.2 pg — ABNORMAL LOW (ref 26.0–34.0)
MCHC: 32 g/dL (ref 30.0–36.0)
MCV: 78.9 fL — ABNORMAL LOW (ref 80.0–100.0)
Platelets: 381 10*3/uL (ref 150–400)
RBC: 5.63 MIL/uL (ref 4.22–5.81)
RDW: 14 % (ref 11.5–15.5)
WBC: 8.2 10*3/uL (ref 4.0–10.5)
nRBC: 0 % (ref 0.0–0.2)

## 2019-03-13 LAB — URINALYSIS, ROUTINE W REFLEX MICROSCOPIC
Bilirubin Urine: NEGATIVE
Glucose, UA: NEGATIVE mg/dL
Hgb urine dipstick: NEGATIVE
Ketones, ur: NEGATIVE mg/dL
Leukocytes,Ua: NEGATIVE
Nitrite: NEGATIVE
Protein, ur: NEGATIVE mg/dL
Specific Gravity, Urine: 1.012 (ref 1.005–1.030)
pH: 7 (ref 5.0–8.0)

## 2019-03-13 LAB — DIFFERENTIAL
Abs Immature Granulocytes: 0.04 10*3/uL (ref 0.00–0.07)
Basophils Absolute: 0 10*3/uL (ref 0.0–0.1)
Basophils Relative: 0 %
Eosinophils Absolute: 0.2 10*3/uL (ref 0.0–0.5)
Eosinophils Relative: 2 %
Immature Granulocytes: 1 %
Lymphocytes Relative: 28 %
Lymphs Abs: 2.5 10*3/uL (ref 0.7–4.0)
Monocytes Absolute: 1 10*3/uL (ref 0.1–1.0)
Monocytes Relative: 12 %
Neutro Abs: 5.1 10*3/uL (ref 1.7–7.7)
Neutrophils Relative %: 57 %

## 2019-03-13 LAB — SARS CORONAVIRUS 2 BY RT PCR (HOSPITAL ORDER, PERFORMED IN ~~LOC~~ HOSPITAL LAB): SARS Coronavirus 2: NEGATIVE

## 2019-03-13 IMAGING — DX DG CHEST 1V PORT
1 series · 1 of 1 positions shown · non-contrast
Comparison: [DATE]

CLINICAL DATA: Shortness of breath.

EXAM:
PORTABLE CHEST 1 VIEW

[chest ap]
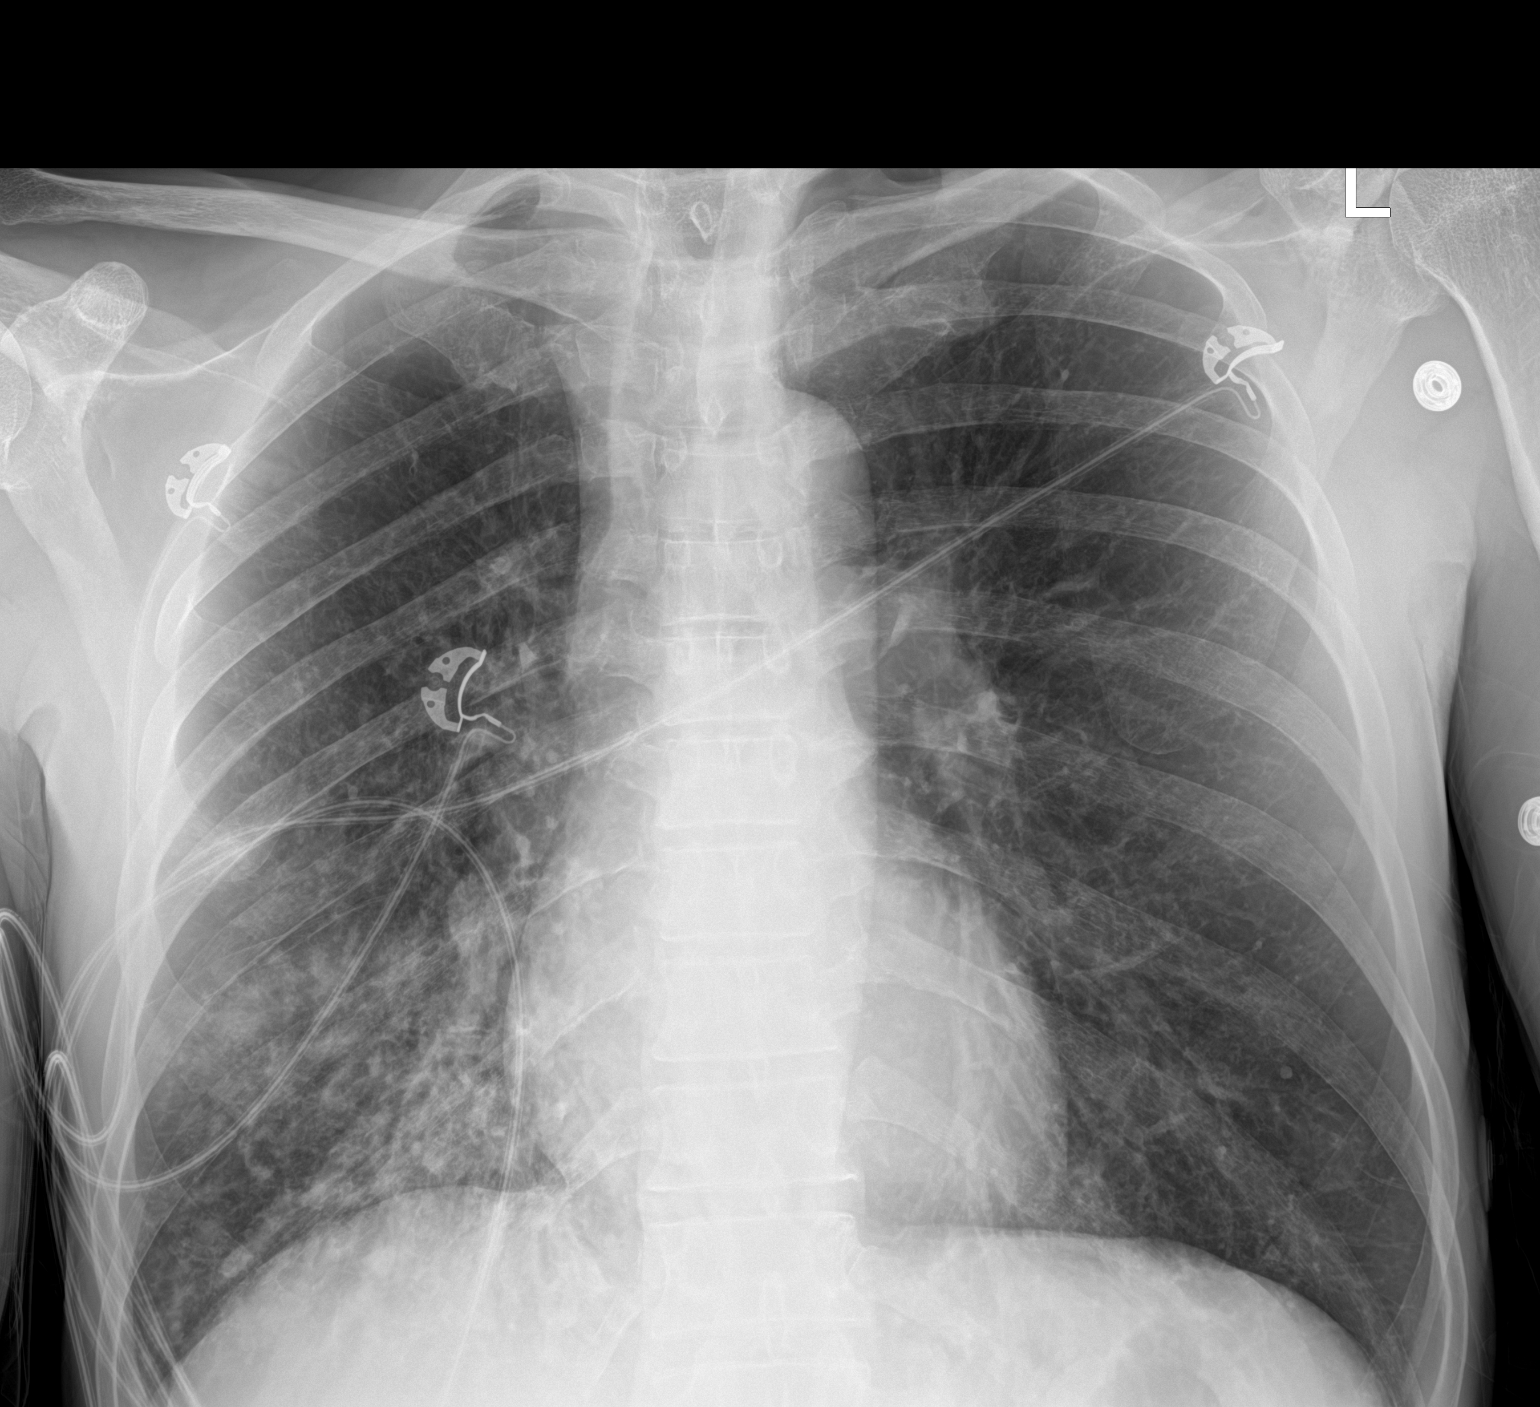

[1 of 1 positions shown; findings below may reference images not displayed]

FINDINGS: Lungs are adequately inflated with worsening airspace opacification
over the right base and minimally over the left base. No effusion.
Cardiomediastinal silhouette and remainder of the exam is unchanged.
IMPRESSION: Worsening right base airspace process and minimal left base
opacification compatible with multifocal pneumonia.

## 2019-03-13 MED ORDER — SODIUM CHLORIDE 0.9% FLUSH
3.0000 mL | Freq: Once | INTRAVENOUS | Status: AC
  Administered 2019-03-13: 13:00:00 3 mL via INTRAVENOUS

## 2019-03-13 MED ORDER — SODIUM CHLORIDE 0.9 % IV SOLN
2.0000 g | INTRAVENOUS | Status: DC
  Administered 2019-03-13 – 2019-03-18 (×6): 2 g via INTRAVENOUS
  Filled 2019-03-13: qty 20
  Filled 2019-03-13 (×2): qty 2
  Filled 2019-03-13: qty 20
  Filled 2019-03-13 (×2): qty 2

## 2019-03-13 MED ORDER — ACETAMINOPHEN 325 MG PO TABS
650.0000 mg | ORAL_TABLET | Freq: Four times a day (QID) | ORAL | Status: DC | PRN
  Administered 2019-03-17 (×2): 650 mg via ORAL
  Filled 2019-03-13 (×2): qty 2

## 2019-03-13 MED ORDER — FLUCONAZOLE 200 MG PO TABS
400.0000 mg | ORAL_TABLET | Freq: Once | ORAL | Status: AC
  Administered 2019-03-13: 21:00:00 400 mg via ORAL
  Filled 2019-03-13: qty 2

## 2019-03-13 MED ORDER — LIDOCAINE VISCOUS HCL 2 % MT SOLN: 15.0000 mL | Freq: Three times a day (TID) | OROMUCOSAL | Status: DC | PRN

## 2019-03-13 MED ORDER — VANCOMYCIN HCL 10 G IV SOLR
1250.0000 mg | Freq: Once | INTRAVENOUS | Status: AC
  Administered 2019-03-13: 1250 mg via INTRAVENOUS
  Filled 2019-03-13: qty 1250

## 2019-03-13 MED ORDER — ENOXAPARIN SODIUM 40 MG/0.4ML ~~LOC~~ SOLN
40.0000 mg | SUBCUTANEOUS | Status: DC
  Administered 2019-03-14 – 2019-03-18 (×5): 40 mg via SUBCUTANEOUS
  Filled 2019-03-13 (×7): qty 0.4

## 2019-03-13 MED ORDER — FLUCONAZOLE 200 MG PO TABS
200.0000 mg | ORAL_TABLET | Freq: Every day | ORAL | Status: DC
  Administered 2019-03-14 – 2019-03-18 (×5): 200 mg via ORAL
  Filled 2019-03-13 (×5): qty 1

## 2019-03-13 MED ORDER — AZITHROMYCIN 250 MG PO TABS
500.0000 mg | ORAL_TABLET | Freq: Every day | ORAL | Status: DC
  Administered 2019-03-13: 21:00:00 500 mg via ORAL
  Filled 2019-03-13: qty 2

## 2019-03-13 MED ORDER — SULFAMETHOXAZOLE-TRIMETHOPRIM 800-160 MG PO TABS
1.0000 | ORAL_TABLET | Freq: Every day | ORAL | Status: DC
  Administered 2019-03-13 – 2019-03-18 (×6): 1 via ORAL
  Filled 2019-03-13 (×5): qty 1

## 2019-03-13 MED ORDER — FENTANYL CITRATE (PF) 100 MCG/2ML IJ SOLN
50.0000 ug | Freq: Once | INTRAMUSCULAR | Status: AC
  Administered 2019-03-13: 13:00:00 50 ug via INTRAVENOUS
  Filled 2019-03-13: qty 2

## 2019-03-13 MED ORDER — SODIUM CHLORIDE 0.9 % IV SOLN
2.0000 g | Freq: Once | INTRAVENOUS | Status: AC
  Administered 2019-03-13: 2 g via INTRAVENOUS
  Filled 2019-03-13: qty 2

## 2019-03-13 MED ORDER — ACETAMINOPHEN 650 MG RE SUPP: 650.0000 mg | Freq: Four times a day (QID) | RECTAL | Status: DC | PRN

## 2019-03-13 MED ORDER — FENTANYL CITRATE (PF) 100 MCG/2ML IJ SOLN
50.0000 ug | Freq: Once | INTRAMUSCULAR | Status: AC
  Administered 2019-03-13: 50 ug via INTRAVENOUS
  Filled 2019-03-13: qty 2

## 2019-03-13 MED ORDER — SODIUM CHLORIDE 0.9 % IV BOLUS
1000.0000 mL | Freq: Once | INTRAVENOUS | Status: AC
  Administered 2019-03-13: 16:00:00 1000 mL via INTRAVENOUS

## 2019-03-13 MED ORDER — SODIUM CHLORIDE 0.9 % IV BOLUS
1000.0000 mL | Freq: Once | INTRAVENOUS | Status: AC
  Administered 2019-03-13: 15:00:00 1000 mL via INTRAVENOUS

## 2019-03-13 NOTE — ED Notes (Signed)
The pt has a non=productive cough intermittently  No blood upstairs yet

## 2019-03-13 NOTE — ED Notes (Signed)
Admitting doctor at  The bedside 

## 2019-03-13 NOTE — H&P (Addendum)
Date: 03/13/2019               Patient Name:  Jon Love MRN: [id number]  DOB: 10-26-1975 Age / Sex: 43 y.o., male   PCP: Patient, No Pcp Per         Medical Service: Internal Medicine Teaching Service         Attending Physician: Dr. Lynnae January, Real Cons, MD    First Contact: Dr. Marianna Payment Pager: [phone]   Second Contact: Dr. Sharon Seller Pager: [phone]       After Hours (After 5p/  First Contact Pager: [phone]  weekends / holidays): Second Contact Pager: [phone]   Chief Complaint: Productive Cough  History of Present Illness: Jon Love is a 43 year old male with a pertinent past medical history of HIV (previous CD4 count < 35) GERD, anemia, EtOH use, hx. Of oral thrush and presented with a 3-week history of cough productive of yellow sputum with associated night sweats/chills, weight loss, and shortness of breath while working.  Patient admits to at least one episode of hematemesis and and hemoptysis.  During his time he is also expressed pain with swallowing and back pain that is constant but worse with deep breaths.  He attributes the back pain to constantly coughing.  Patient has tried Tylenol which has helped his back pain.  Patient also has had significant chills at night with associated myalgias and arthralgias.  Patient also admits to increased dizziness and headaches with confusion, denies chest pain, dysuria, diarrhea, abdominal pain.   Patients most recent admission was in June of 2020, during which he was treated for cavitary MAC pneumonia with macrolide therapy and restarted on his retroviral therapy and HIV prophylaxis.  Patient has been off of his antiretroviral therapy for the last 15 days and denies taking azithromycin as an outpatient. He states that he is continue to take his Bactrim but has not been taking fluconazole ppx.   Social:  Social History   Socioeconomic History  . Marital status: Single    Spouse name: Not on file  . Number of  children: Not on file  . Years of education: Not on file  . Highest education level: Not on file  Occupational History  . Not on file  Social Needs  . Financial resource strain: Not on file  . Food insecurity    Worry: Not on file    Inability: Not on file  . Transportation needs    Medical: Not on file    Non-medical: Not on file  Tobacco Use  . Smoking status: Never Smoker  . Smokeless tobacco: Never Used  Substance and Sexual Activity  . Alcohol use: Yes    Alcohol/week: 6.0 standard drinks    Types: 6 Cans of beer per week    Comment: socially  . Drug use: Not Currently  . Sexual activity: Never    Partners: Female    Comment: pt given condoms  Lifestyle  . Physical activity    Days per week: Not on file    Minutes per session: Not on file  . Stress: Not on file  Relationships  . Social Herbalist on phone: Not on file    Gets together: Not on file    Attends religious service: Not on file    Active member of club or organization: Not on file    Attends meetings of clubs or organizations: Not on file    Relationship status: Not on file  .  Intimate partner violence    Fear of current or ex partner: Not on file    Emotionally abused: Not on file    Physically abused: Not on file    Forced sexual activity: Not on file  Other Topics Concern  . Not on file  Social History Narrative   ** Merged History Encounter **         Family History: History reviewed. No pertinent family history.  Meds:  No outpatient medications have been marked as taking for the 03/13/19 encounter [facility] Encounter).     Allergies: Allergies as of 03/13/2019 - Review Complete 03/13/2019  Allergen Reaction Noted  . Penicillins Hives and Itching 10/06/2014   Past Medical History:  Diagnosis Date  . HIV disease ([place])   . Known health problems: none      Review of Systems: A complete ROS was negative except as per HPI.   Physical Exam: Blood pressure 105/69, pulse  67, temperature 98.5 F (36.9 C), temperature source Oral, resp. rate 19, SpO2 97 %. (on room air) Physical Exam  Constitutional: He is oriented to person, place, and time. No distress.  HENT:  Mouth/Throat: Oropharyngeal exudate (white colored) present.  Neck: Normal range of motion. No thyroid mass present.  Cardiovascular: Normal rate, regular rhythm, normal heart sounds and intact distal pulses. Exam reveals no gallop and no friction rub.  No murmur heard. Pulmonary/Chest: No respiratory distress. He has no wheezes. He has rales (right lung base). He exhibits tenderness.  Abdominal: Soft. He exhibits no distension. There is no abdominal tenderness.  Musculoskeletal: Normal range of motion.  Neurological: He is alert and oriented to person, place, and time.  Skin: Skin is warm and dry.    EKG: personally reviewed my interpretation is sinus tachycardia   CXR: personally reviewed my interpretation is worsening right base airspace process and minimal left base opacification compatible with multifocal pneumonia.  Assessment & Plan by Problem: Active Problems:   Multifocal pneumonia  Jon Love is a 43 year old male with a pertinent past medical history of HIV ( previous CD4 count < 35) GERD, anemia, EtOH use, hx. Of oral thrush and presented with a 3-week history of cough productive of yellow sputum with associated shortness of breath in the setting of in ability to take antiretroviral for the past 15 days is concerning opportunistic pneumonia.   #Multifocal pneumonia: Patient is currently afebrile, with a normal lactic acid, and no leukocytosis. 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  This is unlikely to represent a treatment failure continued considering he has not been on optimal treatment with azithromycin at this time.  Patient will likely need close follow-up with infectious disease  due to prolonged treatment course of 15 to 18 months and repeat sputum cultures to confirm negative for MAC. - Blood cultures pending - AFB smear and culture pending - Quantiferon -TB test - Started Aztreonam and Vancomycin. We will transition him to Azithromycin and Ceftriaxone.   - Monitor CBC for leukocytosis - likely need ID consult for probable MAC recurrence.    BHIV: Most recent HIV RNA PCR was 376,000.  Patient was most recently discharged from the hospital in July on Bactrim and Diflucan prophylaxis daily.  Follow-up appointment was arranged with community clinic for Triumeq prescription.  Patient has been 15 days without antiretroviral therapy.  Patient states that his medications did not arrive at his house as they usually did.  Tried to call the clinic to get a refill but could not get through. - HIV-1 RNA  - CD4 level - Likely will need ID consult in AM   Oral Candidiasis: - Fluconazole 400 mg, then 200 mg for 14-28 days. (last full strength treatment given on 03/15/2015)   Diet: normal  VTE: enoxaparin 40 mg IVF: 2L bolus of NS in the ED  Code: Full  Dispo: Admit patient to Inpatient with expected length of stay greater than 2 midnights.  Signed: Marianna Payment, MD 03/13/2019, 6:47 PM  Pager: [phone]

## 2019-03-13 NOTE — ED Notes (Signed)
The p[ts food has arrived from the kitchen

## 2019-03-13 NOTE — ED Triage Notes (Signed)
Pt here for eval of 3 weeks of chest pain, shortness of breath, cough, and joint pain. No known exposure to corona virus. No diarrhea.

## 2019-03-13 NOTE — ED Provider Notes (Signed)
Jon Love is a 43 y.o. male with a hx of HIV (last CD4 count < 35), prior cavitary pneumonia, thrush of the mouth/esophagus, GERD, anemia, EtOH use, & prior cholecystectomy who presents to the ED w/ complaints of cough x 1 month & chest/back pain x 3 days. Patient states that he has had a productive cough with yellow sputum production for about 1 month, states he did have 1 episode of coughing where he feels he coughed up about an egg yolk sized blood clot- no other complaits of hemoptysis. Reports 3 days ago he started experiencing chest pain (anterior bilateral) & back pain, pain is constant, worse with deep breaths, coughing, certain movements, & certain positions, alleviated some by tylenol which he is taking at home PRN. States most prominent area of pain is in the R back. Has also had , ear pain, sore throat, dyspnea, chills, & myalgias/arthrlagis. Denies fever, vomiting, diarrhea, abdominal pain, or syncope.    Recent hospital admission 12/05/18-12/10/18 for pneumonia requiring oxygen via [place], was covid negative, CXR w/ patchy airspace opacity in R middle & lower lung zones, seen by ID, TB ruled out, treated for cavitary pneumonia w/ abx & discharged home on Triumeq as well as prophylactic bactrim & fluconazole. He had been taking his HIV medications as prescribed until he ran out of all of these about 15 days ago.     Interpretor utilized throughout Audiological scientist.    HPI  Past Medical History:  Diagnosis Date  . HIV disease (HCC)   . Known health problems: none     Patient Active Problem List   Diagnosis Date Noted  . HIV infection (HCC) 12/08/2018  . Microcytic anemia 12/08/2018  . Cavitary pneumonia 12/05/2018  . Alcohol use 12/05/2018  . Thrush of mouth  and esophagus (HCC) 03/21/2016  . Anemia 08/16/2015  . Gastroesophageal reflux disease without esophagitis   . Streptococcal pneumonia (HCC) 03/17/2015  . Gout 10/12/2014  . Cholelithiasis 10/07/2014  . Protein-calorie malnutrition, severe (HCC) 10/07/2014  . 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 Choledocholithiasis   . Abnormal transaminases 10/06/2014    Past Surgical History:  Procedure Laterality Date  . CHOLECYSTECTOMY N/A 10/11/2014   Procedure: LAPAROSCOPIC CHOLECYSTECTOMY WITH INTRAOPERATIVE CHOLANGIOGRAM;  Surgeon: Abigail Miyamoto, MD;  Location: [facility] OR;  Service: General;  Laterality: N/A;  . ERCP N/A 10/09/2014   Procedure: ENDOSCOPIC RETROGRADE CHOLANGIOPANCREATOGRAPHY (ERCP);  Surgeon: Jeani Hawking, MD;  Location: [facility] ENDOSCOPY;  Service: Endoscopy;  Laterality: N/A;        Home Medications    Prior to Admission medications   Medication Sig Start Date End Date Taking? Authorizing Provider  abacavir-dolutegravir-lamiVUDine (TRIUMEQ) 600-50-300 MG tablet Take 1 tablet by mouth daily. 12/23/18   Cliffton Asters, MD  acetaminophen (TYLENOL) 325 MG tablet Take 325 mg by mouth every 6 (six) hours as needed for mild pain or fever.    [provider]  fluconazole (DIFLUCAN) 100 MG tablet Take 1 tablet (100 mg total) by mouth once a week. 12/23/18   Cliffton Asters, MD  sulfamethoxazole-trimethoprim (BACTRIM) 400-80 MG tablet Take 1 tablet by mouth daily. 12/23/18   Cliffton Asters, MD    Family History No family history on file.  Social History Social History   Tobacco Use  .  Smoking status: Never Smoker  . Smokeless tobacco: Never Used  Substance Use Topics  . Alcohol use: Yes    Alcohol/week: 6.0 standard drinks    Types: 6 Cans of beer per week    Comment: socially  . Drug use: Not Currently     Allergies   Penicillins   Review of Systems Review of Systems  Constitutional: Positive for chills. Negative for fever.  HENT: Positive for ear pain and  sore throat. Negative for congestion, dental problem and rhinorrhea.   Respiratory: Positive for cough and shortness of breath.   Cardiovascular: Positive for chest pain.  Gastrointestinal: Negative for abdominal pain, diarrhea and vomiting.  Genitourinary: Positive for dysuria.  Musculoskeletal: Positive for back pain.  Neurological: Negative for syncope.  All other systems reviewed and are negative.    Physical Exam Updated Vital Signs BP 91/65   Pulse (!) 109   Temp 98.5 F (36.9 C) (Oral)   Resp 20   SpO2 93%   Physical Exam Vitals signs and nursing note reviewed.  Constitutional:      General: He is not in acute distress. HENT:     Head: Normocephalic and atraumatic.     Right Ear: Tympanic membrane is not perforated, erythematous, retracted or bulging.     Left Ear: Tympanic membrane is not perforated, erythematous, retracted or bulging.     Nose: Nose normal.     Mouth/Throat:     Pharynx: Uvula midline.     Comments: Thrush present to tongue and to posterior oropharynx.  Eyes:     General:        Right eye: No discharge.        Left eye: No discharge.     Conjunctiva/sclera:     Right eye: Right conjunctiva is injected.     Left eye: Left conjunctiva is injected.  Neck:     Musculoskeletal: Neck supple. No neck rigidity.  Cardiovascular:     Rate and Rhythm: Normal rate and regular rhythm.  Pulmonary:     Effort: No respiratory distress.     Breath sounds: Examination of the right-middle field reveals rales. Examination of the right-lower field reveals rales. Rales present. No wheezing.  Chest:     Chest wall: Tenderness (diffuse anterior & posterior bilateral chest wall) present. No deformity or crepitus.  Abdominal:     General: There is no distension.     Palpations: Abdomen is soft.     Tenderness: There is no abdominal tenderness.  Skin:    General: Skin is warm and dry.     Findings: No rash.  Neurological:     Mental Status: He is alert.      Comments: Clear speech.   Psychiatric:        Behavior: Behavior normal.     ED Treatments / Results  Labs (all labs ordered are listed, but only abnormal results are displayed) Labs Reviewed  BASIC METABOLIC PANEL  CBC  TROPONIN I (HIGH SENSITIVITY)    EKG EKG Interpretation  Date/Time:  Sunday March 13 2019 11:37:37 EDT Ventricular Rate:  111 PR Interval:  130 QRS Duration: 82 QT Interval:  296 QTC Calculation: 402 R Axis:   -102 Text Interpretation:  Sinus tachycardia No STEMI  Confirmed by Alvester Chourifan, Matthew [id number]) on 03/13/2019 12:33:51 PM   Radiology Dg Chest Portable 1 View  Result Date: 03/13/2019 CLINICAL DATA:  Shortness of breath. EXAM: PORTABLE CHEST 1 VIEW COMPARISON:  12/09/2018 FINDINGS: Lungs are adequately inflated with worsening  airspace opacification over the right base and minimally over the left base. No effusion. Cardiomediastinal silhouette and remainder of the exam is unchanged. IMPRESSION: Worsening right base airspace process and minimal left base opacification compatible with multifocal pneumonia. Electronically Signed   By: Elberta Fortis M.D.   On: 03/13/2019 13:11    Procedures .Critical Care Performed by: Cherly Anderson, PA-C Authorized by: Cherly Anderson, PA-C     (including critical care time)  CRITICAL CARE Performed by: Harvie Heck   Total critical care time: 30 minutes  Critical care time was exclusive of separately billable procedures and treating other patients.  Critical care was necessary to treat or prevent imminent or life-threatening deterioration.  Critical care was time spent personally by me on the following activities: development of treatment plan with patient and/or surrogate as well as nursing, discussions with consultants, evaluation of patient's response to treatment, examination of patient, obtaining history from patient or surrogate, ordering and performing treatments and interventions,  ordering and review of laboratory studies, ordering and review of radiographic studies, pulse oximetry and re-evaluation of patient's condition.   Medications Ordered in ED Medications  sodium chloride flush (NS) 0.9 % injection 3 mL (has no administration in time range)     Initial Impression / Assessment and Plan / ED Course  I have reviewed the triage vital signs and the nursing notes.  Pertinent labs & imaging results that were available during my care of the patient were reviewed by me and considered in my medical decision making (see chart for details). Patient with history of HIV last CD4 count less than 35 presents to the emergency department with 3 weeks of productive cough now with chest and back pain as well.  Recent admission for cavitary pneumonia, discharged home with HIV medications including prophylaxis with Diflucan and Bactrim.  He had been taking his medications as prescribed until he ran out approximately 15 days ago.  He is nontoxic-appearing, initial tachycardia normalized on my exam, SPO2 94 to 99% while I am in the exam room, pressures are a bit soft but similar to previous on chart review.  He has rails to the mid and lower right lung fields, his pain to the chest and back is fairly reproducible with chest wall palpation.  Primary concern is for pneumonia in this immunocompromised patient, he is also noted to have thrush to the posterior oropharynx.  CBC: No anemia or leukocytosis, differential added on from triage labs BMP: Mild hyponatremia/hypochloremia and hyperglycemia, no significant electrolyte derangement, renal function preserved Initial troponin: 12 EKG: Sinus tachycardia without STEMI  Chest x-ray:Worsening right base airspace process and minimal left base opacification compatible with multifocal pneumonia.  Lactic acid WNL Blood cultures in process.   Pressures soft- sepsis remains possibility, HCAP coverage started, 2L of fluid bolus ordered for 30 cc/kg  bolus, will consult for unassigned medical admission. COVID testing pending.   14:44: CONSULT: Discussed w/ Dr. Cleaster Corin with residency teaching service- pending covid swab.   COVID negative.   17:15: CONSULT: Re-discussed w/ Dr. Cleaster Corin- accepts admission.   Satish Mahir Michel Love was evaluated in Emergency Department on 03/13/2019 for the symptoms described in the history of present illness. He/she was evaluated in the context of the global COVID-19 pandemic, which necessitated consideration that the patient might be at risk for infection with the SARS-CoV-2 virus that causes COVID-19. Institutional protocols and algorithms that pertain to the evaluation of patients at risk for COVID-19 are in a state of rapid change based  on information released by regulatory bodies including the CDC and federal and state organizations. These policies and algorithms were followed during the patient's care in the ED.  Final Clinical Impressions(s) / ED Diagnoses   Final diagnoses:  Multifocal pneumonia  [facility]    ED Discharge Orders    None       Amaryllis Dyke, PA-C 03/13/19 1720    Wyvonnia Dusky, MD 03/13/19 2057

## 2019-03-14 DIAGNOSIS — K0889 Other specified disorders of teeth and supporting structures: Secondary | ICD-10-CM

## 2019-03-14 DIAGNOSIS — Z9114 Patient's other noncompliance with medication regimen: Secondary | ICD-10-CM

## 2019-03-14 DIAGNOSIS — B370 Candidal stomatitis: Secondary | ICD-10-CM

## 2019-03-14 DIAGNOSIS — J189 Pneumonia, unspecified organism: Secondary | ICD-10-CM

## 2019-03-14 DIAGNOSIS — K089 Disorder of teeth and supporting structures, unspecified: Secondary | ICD-10-CM | POA: Insufficient documentation

## 2019-03-14 DIAGNOSIS — Z8701 Personal history of pneumonia (recurrent): Secondary | ICD-10-CM

## 2019-03-14 DIAGNOSIS — B20 Human immunodeficiency virus [HIV] disease: Principal | ICD-10-CM

## 2019-03-14 DIAGNOSIS — Z880 Allergy status to penicillin: Secondary | ICD-10-CM

## 2019-03-14 DIAGNOSIS — R1312 Dysphagia, oropharyngeal phase: Secondary | ICD-10-CM

## 2019-03-14 LAB — CBC WITH DIFFERENTIAL/PLATELET
Abs Immature Granulocytes: 0.04 10*3/uL (ref 0.00–0.07)
Basophils Absolute: 0 10*3/uL (ref 0.0–0.1)
Basophils Relative: 0 %
Eosinophils Absolute: 0.3 10*3/uL (ref 0.0–0.5)
Eosinophils Relative: 4 %
HCT: 37.6 % — ABNORMAL LOW (ref 39.0–52.0)
Hemoglobin: 12.4 g/dL — ABNORMAL LOW (ref 13.0–17.0)
Immature Granulocytes: 1 %
Lymphocytes Relative: 24 %
Lymphs Abs: 2 10*3/uL (ref 0.7–4.0)
MCH: 25.8 pg — ABNORMAL LOW (ref 26.0–34.0)
MCHC: 33 g/dL (ref 30.0–36.0)
MCV: 78.3 fL — ABNORMAL LOW (ref 80.0–100.0)
Monocytes Absolute: 0.8 10*3/uL (ref 0.1–1.0)
Monocytes Relative: 10 %
Neutro Abs: 5.2 10*3/uL (ref 1.7–7.7)
Neutrophils Relative %: 61 %
Platelets: 312 10*3/uL (ref 150–400)
RBC: 4.8 MIL/uL (ref 4.22–5.81)
RDW: 14 % (ref 11.5–15.5)
WBC: 8.4 10*3/uL (ref 4.0–10.5)
nRBC: 0 % (ref 0.0–0.2)

## 2019-03-14 LAB — COMPREHENSIVE METABOLIC PANEL
ALT: 19 U/L (ref 0–44)
AST: 17 U/L (ref 15–41)
Albumin: 2.7 g/dL — ABNORMAL LOW (ref 3.5–5.0)
Alkaline Phosphatase: 75 U/L (ref 38–126)
Anion gap: 10 (ref 5–15)
BUN: 6 mg/dL (ref 6–20)
CO2: 21 mmol/L — ABNORMAL LOW (ref 22–32)
Calcium: 8.5 mg/dL — ABNORMAL LOW (ref 8.9–10.3)
Chloride: 100 mmol/L (ref 98–111)
Creatinine, Ser: 0.8 mg/dL (ref 0.61–1.24)
GFR calc Af Amer: >60 mL/min (ref >60)
GFR calc non Af Amer: >60 mL/min (ref >60)
Glucose, Bld: 99 mg/dL (ref 70–99)
Potassium: 4.1 mmol/L (ref 3.5–5.1)
Sodium: 131 mmol/L — ABNORMAL LOW (ref 135–145)
Total Bilirubin: 0.7 mg/dL (ref 0.3–1.2)
Total Protein: 5.5 g/dL — ABNORMAL LOW (ref 6.5–8.1)

## 2019-03-14 LAB — T-HELPER CELLS (CD4) COUNT (NOT AT ARMC)
CD4 % Helper T Cell: 1 % — ABNORMAL LOW (ref 33–65)
CD4 T Cell Abs: <35 /uL — ABNORMAL LOW (ref 400–1790)

## 2019-03-14 MED ORDER — BICTEGRAVIR-EMTRICITAB-TENOFOV 50-200-25 MG PO TABS
1.0000 | ORAL_TABLET | Freq: Every day | ORAL | Status: DC
  Administered 2019-03-14 – 2019-03-18 (×5): 1 via ORAL
  Filled 2019-03-14 (×5): qty 1

## 2019-03-14 MED ORDER — SODIUM CHLORIDE 0.9 % IV BOLUS
500.0000 mL | Freq: Once | INTRAVENOUS | Status: AC
  Administered 2019-03-14: 09:00:00 500 mL via INTRAVENOUS

## 2019-03-14 MED ORDER — SODIUM CHLORIDE 0.9 % IV SOLN
INTRAVENOUS | Status: DC
  Administered 2019-03-15: 03:00:00 via INTRAVENOUS

## 2019-03-14 MED ORDER — METRONIDAZOLE 500 MG PO TABS
500.0000 mg | ORAL_TABLET | Freq: Four times a day (QID) | ORAL | Status: DC
  Administered 2019-03-15 – 2019-03-18 (×15): 500 mg via ORAL
  Filled 2019-03-14 (×16): qty 1

## 2019-03-14 NOTE — ED Notes (Signed)
REGULAR DINNER TRAY ORDERED 

## 2019-03-14 NOTE — ED Notes (Signed)
Tele ordered bfast 

## 2019-03-14 NOTE — ED Notes (Signed)
Regular lunch tray ordered 

## 2019-03-14 NOTE — Progress Notes (Signed)
  Date: 03/14/2019  Patient name: Ismay record number: [id number]  Date of birth: 1975/09/01        I have seen and evaluated this patient and I have discussed the plan of care with the house staff. Please see their note for complete details. I concur with their findings.  See my separate attested H&P.   Velna Ochs, MD 03/14/2019, 5:06 PM

## 2019-03-14 NOTE — ED Triage Notes (Signed)
This Probation officer at bed side with interpreter to give Pt instruction on how to collect sputum sample.

## 2019-03-14 NOTE — Consult Note (Addendum)
[facility] for Infectious Disease    Date of Admission:  03/13/2019   Day 2 vancomycin, aztreonam, and azithromycin            Reason for Consult: Antibiotic reccomendations    Referring Provider: Velna Ochs, MD  Primary Care Provider: None   Assessment: In summary, [name] is a 43 y.o male with a hx of poorly controlled HIV (CD4 count 35, viral load 367,000 on 7/1), cavitary MAC pneumonia in July of this year, who presents with increased productive cough and SOB over the past month. CXR findings consistent with multifocal pneumonia. Given HIV status, PCP and TB cannot be ruled out at this time.   Plan: 1. Switch abx to Ceftriaxone 2g q24 hrs, Flagyl 500 mg TID 2. Start Biktarvy 50 mg/200 mg/25 mg daily 3.   Follow up AFB blood cultures, AFB sputum cultures, smear, quantiferon TB 4.   Follow up CD4 count & HIV viral load  5.   Non-contrast CT Chest to evaluate cavitary lesion in RUL 6.   Ordered LDH add on lab  Active Problems:   Thrush   Multifocal pneumonia  . azithromycin  500 mg Oral Daily  . enoxaparin (LOVENOX) injection  40 mg Subcutaneous Q24H  . fluconazole  200 mg Oral Daily  . sulfamethoxazole-trimethoprim  1 tablet Oral Daily    HPI: History was obtained through [place] interpreter.  Jon Love is a 43 y.o. speaking male with a PMHx signicant for HIV (CD4 35, viral load 376,000 on 7/1) who presents with SOB and cough that has increased over the the last month. The patient has also been experiencing night sweats/chills, weight loss and decreased appetitie during this time as well. He states that he was hospitalized for pneumonia at the end of June this past year. His symptoms improved after receiving abx, but he feels like the symptoms he is having now is the same thing he experienced back then. The patient denies having any sick contacts, and lives by himself. 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 He says his job is very good about providing masks, face shields, and other protective equipment.   Of note, the patient had cavitary pneumonia at the end of June of this year and was treated with ceftriaxone and azithromycin. TB was negative at that time. He received a total of 5 days of antibiotics. He was seen by Dr. Megan Salon on July 16th for follow up and his symptoms appeared to have resolved at that time. He had follow up appointments scheduled to manage his HIV, but wasn't able to follow up. The patient states he has not had his HIV medication in about 2 weeks  Review of Systems: ROS are otherwise negative unless mentioned in the HPI.   Past Medical History:  Diagnosis Date  . HIV disease ([place])   . Known health problems: none     Social History   Tobacco Use  . Smoking status: Never Smoker  . Smokeless tobacco: Never Used  Substance Use Topics  . Alcohol use: Yes    Alcohol/week: 6.0 standard drinks    Types: 6 Cans of beer per week    Comment: socially  . Drug use: Not Currently   History reviewed. No pertinent family history. Allergies  Allergen Reactions  . Penicillins Hives and Itching    Has patient had a PCN reaction causing immediate rash, facial/tongue/throat swelling, SOB or  lightheadedness with hypotension: Yes Has patient had a PCN reaction causing severe rash involving mucus membranes or skin necrosis: No Has patient had a PCN reaction that required hospitalization No Has patient had a PCN reaction occurring within the last 10 years: No If all of the above answers are "NO", then may proceed with Cephalosporin use.    OBJECTIVE: Blood pressure 94/62, pulse 91, temperature 98.2 F (36.8 C), resp. rate (!) 22, SpO2 94 %.  Physical Exam Constitutional:      General: He is not in acute distress.    Appearance: Normal appearance. He is normal weight. He is not toxic-appearing.  HENT:     Head: Normocephalic and atraumatic.     Nose:      Comments: Nasal cannula in place    Mouth/Throat:     Comments: Poor dentition, thrush present  Eyes:     Extraocular Movements: Extraocular movements intact.  Cardiovascular:     Rate and Rhythm: Normal rate and regular rhythm.     Pulses: Normal pulses.     Heart sounds: Normal heart sounds. No murmur. No friction rub. No gallop.   Pulmonary:     Effort: Pulmonary effort is normal. No respiratory distress.     Breath sounds: Wheezing (mild) present.     Comments: Frequent coughs with deep inspiration   Mild crackles diffusely  Chest:     Chest wall: No tenderness.  Abdominal:     General: Abdomen is flat. Bowel sounds are normal. There is no distension.     Palpations: Abdomen is soft.     Tenderness: There is abdominal tenderness (RUQ mild).  Musculoskeletal:        General: No swelling or deformity.     Right lower leg: No edema.     Left lower leg: No edema.  Skin:    General: Skin is warm.  Neurological:     General: No focal deficit present.     Mental Status: He is alert and oriented to person, place, and time.  Psychiatric:        Mood and Affect: Mood normal.    Lab Results Lab Results  Component Value Date   WBC 8.4 03/14/2019   HGB 12.4 (L) 03/14/2019   HCT 37.6 (L) 03/14/2019   MCV 78.3 (L) 03/14/2019   PLT 312 03/14/2019    Lab Results  Component Value Date   CREATININE 0.80 03/14/2019   BUN 6 03/14/2019   NA 131 (L) 03/14/2019   K 4.1 03/14/2019   CL 100 03/14/2019   CO2 21 (L) 03/14/2019    Lab Results  Component Value Date   ALT 19 03/14/2019   AST 17 03/14/2019   ALKPHOS 75 03/14/2019   BILITOT 0.7 03/14/2019     Microbiology: Recent Results (from the past 240 hour(s))  Blood culture (routine x 2)     Status: None (Preliminary result)   Collection Time: 03/13/19  1:00 PM   Specimen: BLOOD  Result Value Ref Range Status   Specimen Description BLOOD SITE NOT SPECIFIED  Final   Special Requests   Final    BOTTLES DRAWN AEROBIC ONLY  Blood Culture results may not be optimal due to an inadequate volume of blood received in culture bottles   Culture   Final    NO GROWTH < 24 HOURS Performed at [facility], [place] [address] [place], [place] [address]    Report Status PENDING  Incomplete  Blood culture (routine x 2)  Status: None (Preliminary result)   Collection Time: 03/13/19  1:00 PM   Specimen: BLOOD  Result Value Ref Range Status   Specimen Description BLOOD SITE NOT SPECIFIED  Final   Special Requests   Final    BOTTLES DRAWN AEROBIC ONLY Blood Culture results may not be optimal due to an inadequate volume of blood received in culture bottles   Culture   Final    NO GROWTH < 24 HOURS Performed at [facility], [address], [place], [place] [address]    Report Status PENDING  Incomplete  SARS Coronavirus 2 [facility] order, Performed in [facility] hospital lab) Nasopharyngeal Nasopharyngeal Swab     Status: None   Collection Time: 03/13/19  3:37 PM   Specimen: Nasopharyngeal Swab  Result Value Ref Range Status   SARS Coronavirus 2 NEGATIVE NEGATIVE Final    Comment: (NOTE) If result is NEGATIVE SARS-CoV-2 target nucleic acids are NOT DETECTED. The SARS-CoV-2 RNA is generally detectable in upper and lower  respiratory specimens during the acute phase of infection. The lowest  concentration of SARS-CoV-2 viral copies this assay can detect is 250  copies / mL. A negative result does not preclude SARS-CoV-2 infection  and should not be used as the sole basis for treatment or other  patient management decisions.  A negative result may occur with  improper specimen collection / handling, submission of specimen other  than nasopharyngeal swab, presence of viral mutation(s) within the  areas targeted by this assay, and inadequate number of viral copies  (<250 copies / mL). A negative result must be combined with clinical  observations, patient history, and epidemiological information. If result is  POSITIVE SARS-CoV-2 target nucleic acids are DETECTED. The SARS-CoV-2 RNA is generally detectable in upper and lower  respiratory specimens dur ing the acute phase of infection.  Positive  results are indicative of active infection with SARS-CoV-2.  Clinical  correlation with patient history and other diagnostic information is  necessary to determine patient infection status.  Positive results do  not rule out bacterial infection or co-infection with other viruses. If result is PRESUMPTIVE POSTIVE SARS-CoV-2 nucleic acids MAY BE PRESENT.   A presumptive positive result was obtained on the submitted specimen  and confirmed on repeat testing.  While 2019 novel coronavirus  (SARS-CoV-2) nucleic acids may be present in the submitted sample  additional confirmatory testing may be necessary for epidemiological  and / or clinical management purposes  to differentiate between  SARS-CoV-2 and other Sarbecovirus currently known to infect humans.  If clinically indicated additional testing with an alternate test  methodology [phone]) is advised. The SARS-CoV-2 RNA is generally  detectable in upper and lower respiratory sp ecimens during the acute  phase of infection. The expected result is Negative. Fact Sheet for Patients:  StrictlyIdeas.no Fact Sheet for Healthcare Providers: BankingDealers.co.za This test is not yet approved or cleared by the [place] FDA and has been authorized for detection and/or diagnosis of SARS-CoV-2 by FDA under an Emergency Use Authorization (EUA).  This EUA will remain in effect (meaning this test can be used) for the duration of the COVID-19 declaration under Section 564(b)(1) of the Act, 21 U.S.C. section 360bbb-3(b)(1), unless the authorization is terminated or revoked sooner. Performed at [facility], [place] [address], [place], [place] [address]     Earlene Plater, MD Internal Medicine, PGY1  Pager: [phone]  03/14/2019,11:05 AM

## 2019-03-14 NOTE — Progress Notes (Signed)
   Subjective: That he continues to have chills, productive cough, and shortness of breath.  He states that shortness of breath has worsened since last night requiring supplemental oxygen at this time.  Otherwise patient continues to have a good appetite at this time denies chest pain, abdominal pain, dysuria, hematuria, change in bowel movements.  Objective:  Vital signs in last 24 hours: Vitals:   03/13/19 2330 03/14/19 0100 03/14/19 0330 03/14/19 0415  BP: 102/71 100/70 97/62 100/67  Pulse: 69 66 75 72  Resp: (!) _0 Temp:      TempSrc:      SpO2: 95% 96% 94% 94%   Physical Exam  Constitutional: He is oriented to person, place, and time. He has a sickly appearance. No distress.  HENT:  Head: Atraumatic.  Mouth/Throat: Oropharyngeal exudate (white plaques) present.  Eyes: EOM are normal.  Neck: Normal range of motion.  Cardiovascular: Normal rate, regular rhythm, normal heart sounds and intact distal pulses. Exam reveals no gallop and no friction rub.  No murmur heard. Pulmonary/Chest: Tachypnea noted. No respiratory distress. He has rales in the right lower field.  Abdominal: Soft. There is no abdominal tenderness.  Musculoskeletal: Normal range of motion.  Lymphadenopathy:    He has no cervical adenopathy.  Neurological: He is alert and oriented to person, place, and time.  Skin: Skin is warm and dry.    Jon Love is a 43 year old male with a pertinent past medical history of HIV ( previous CD4 count <35 and viral load 367,000 on 07/01) GERD, anemia, EtOH use, hx. of oral thrush and presented with a 3-week history of cough productive of yellow sputum with associated shortness of breath in the setting of in ability to take antiretroviral for the past 15 days is concerning opportunistic pneumonia. Patient was previously admitted to [facility] with treatment of a multifocal pneumonia due to [place].  Single dose of aztreonam and vancomycin were given in the ED.  10/05:  Infectious disease was consulted at this time. ID recommended continuing ceftriaxone and starting flagyl for multifocal pneumonia.  Start Biktarvy 50 mg/400 mg / 25 mg daily.  ID ordered a noncontrast CT to evaluate cavitary lesion.    Assessment/Plan:  Active Problems:   Thrush   Multifocal pneumonia   #Multifocal pneumonia:  Patient remains afebrile but requiring supplemental oxygen at 2 L [name] to maintain O2 saturation greater than 92%.  Patient has WBC of 8.4.  Patient's blood pressures are borderline hypotensive.  Appreciate infectious disease recommendations.  Waiting on AFB smear and cultures, blood cultures, QuantiFERON TB test results.  Centering HIV status, PCP and TB remain on our differential at this time. -Continue ceftriaxone 2 g every 24 hours and Flagyl 500 mg 3 times daily -CT chest: Pending result  #HIV:  Patient was restarted on Biktarvy 50 mg/200 mg / 25 mg daily per ID.  Pending CD4 count and HIV viral load.  Continue Diflucan for oral thrush.  #Oral Candidiasis: - Continue taking fluconazole as prescribed below.  - Fluconazole 400 mg, then 200 mg for 14-28 days.  Diet: Normal IVF: 75 cc/h NS VTE: Enoxaparin 40 mg Code: full  Dispo: Anticipated discharge pending clinical improvement.  Marianna Payment, MD 03/14/2019, 5:14 AM Pager: [phone]

## 2019-03-14 NOTE — ED Notes (Signed)
Report given to charge RN by previous Therapist, sports.

## 2019-03-15 ENCOUNTER — Inpatient Hospital Stay (HOSPITAL_COMMUNITY): Payer: Self-pay

## 2019-03-15 DIAGNOSIS — B379 Candidiasis, unspecified: Secondary | ICD-10-CM

## 2019-03-15 LAB — HIV-1 RNA QUANT-NO REFLEX-BLD
HIV 1 RNA Quant: 6350 copies/mL
LOG10 HIV-1 RNA: 3.803 log10copy/mL

## 2019-03-15 LAB — CBC
HCT: 37 % — ABNORMAL LOW (ref 39.0–52.0)
Hemoglobin: 12.2 g/dL — ABNORMAL LOW (ref 13.0–17.0)
MCH: 25.5 pg — ABNORMAL LOW (ref 26.0–34.0)
MCHC: 33 g/dL (ref 30.0–36.0)
MCV: 77.4 fL — ABNORMAL LOW (ref 80.0–100.0)
Platelets: 365 10*3/uL (ref 150–400)
RBC: 4.78 MIL/uL (ref 4.22–5.81)
RDW: 14 % (ref 11.5–15.5)
WBC: 6 10*3/uL (ref 4.0–10.5)
nRBC: 0 % (ref 0.0–0.2)

## 2019-03-15 LAB — LACTATE DEHYDROGENASE: LDH: 136 U/L (ref 98–192)

## 2019-03-15 IMAGING — CT CT CHEST W/O CM
2 of 4 series · 15 of 36 positions shown, 18 images · non-contrast
Comparison: Chest x-ray [DATE] and chest CT [DATE]

CLINICAL DATA: HIV positive with persistent pneumonia.

EXAM:
CT CHEST WITHOUT CONTRAST
TECHNIQUE: Multidetector CT imaging of the chest was performed following the
standard protocol without IV contrast.

[Series 5: chest w/o 3mm st cor · coronal · non-contrast · 0.70mm/px · 3 of 100 slices shown]
[im 20/100  lung]
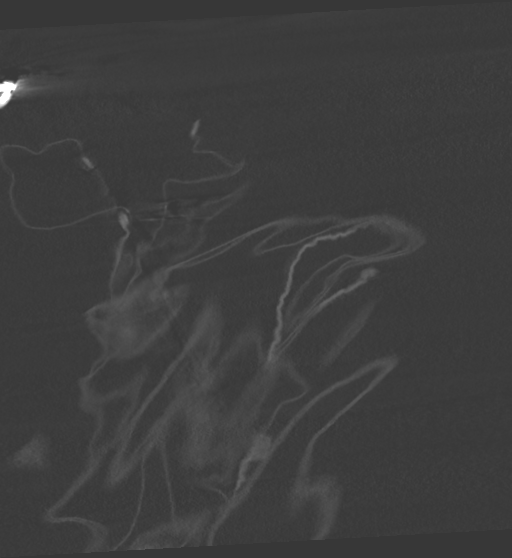
[im 40/100  lung]
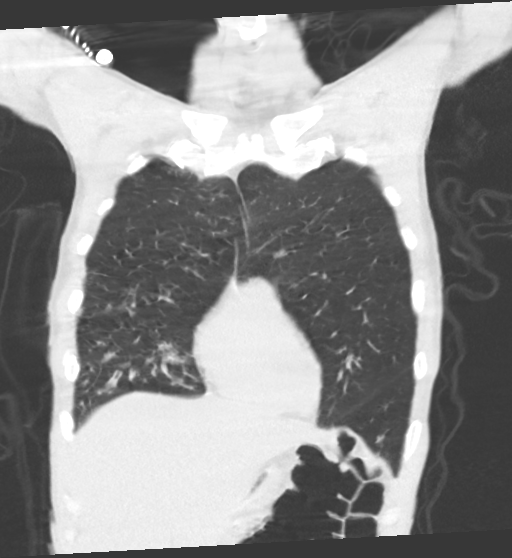
[im 60/100  lung]
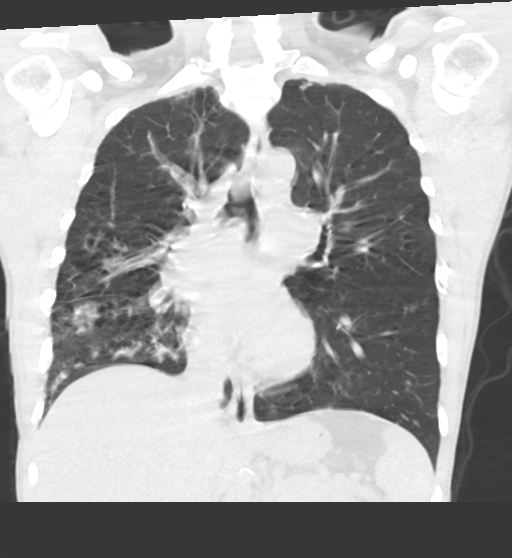

[Series 7: chest w/o 2mm st · axial · non-contrast · 0.69mm/px · z∈[+1114,+1426]mm · 12 of 184 slices shown, 15 images]
[im 14/184  mediastinal]
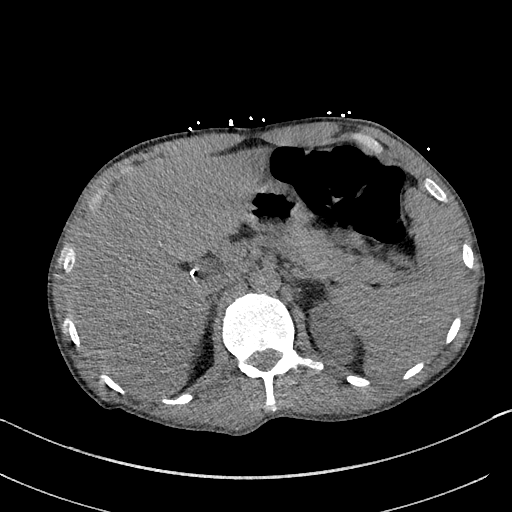
[im 14/184  lung]
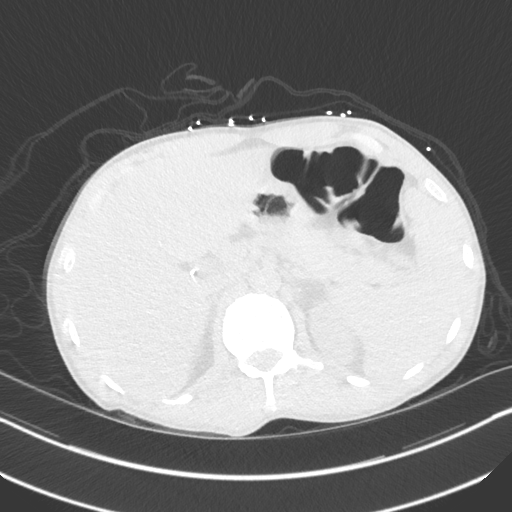
[im 27/184  lung]
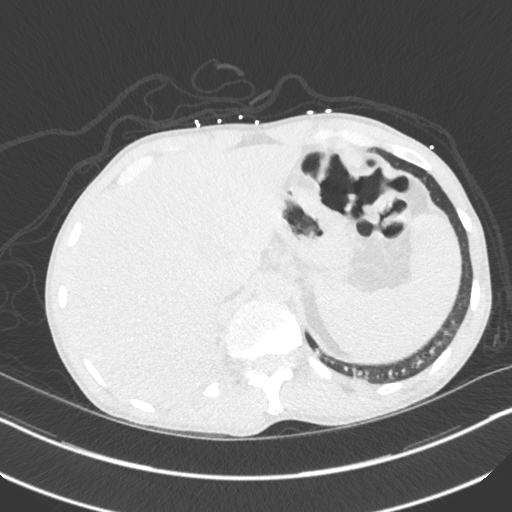
[im 40/184  lung]
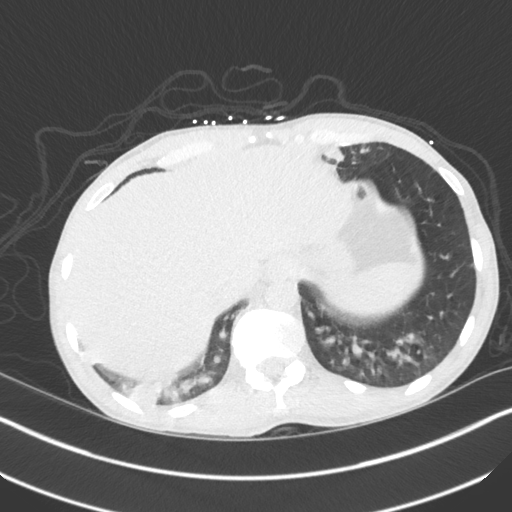
[im 53/184  lung]
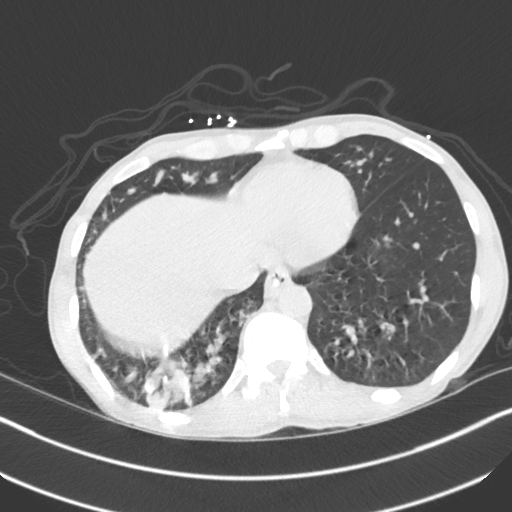
[im 66/184  mediastinal]
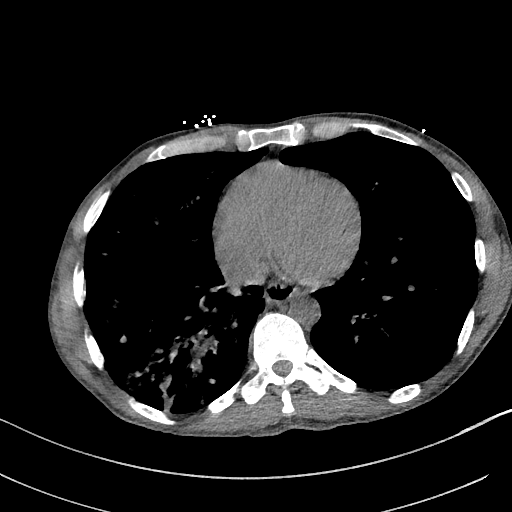
[im 66/184  lung]
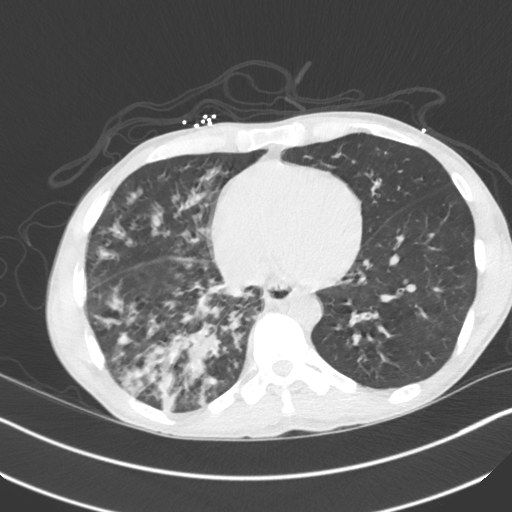
[im 79/184  lung]
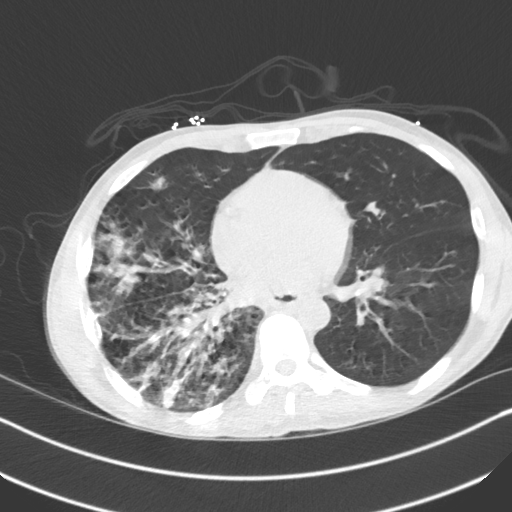
[im 105/184  lung]
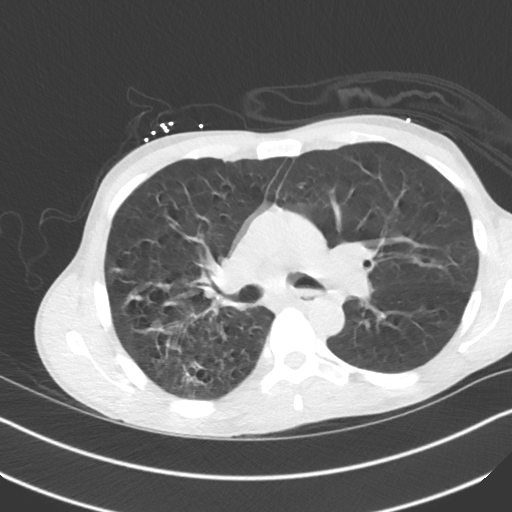
[im 118/184  lung]
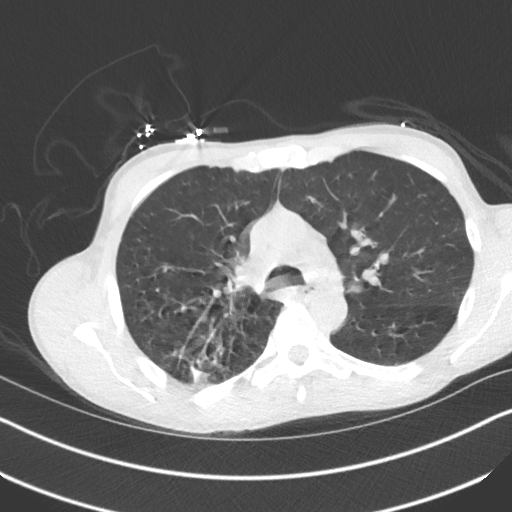
[im 131/184  mediastinal]
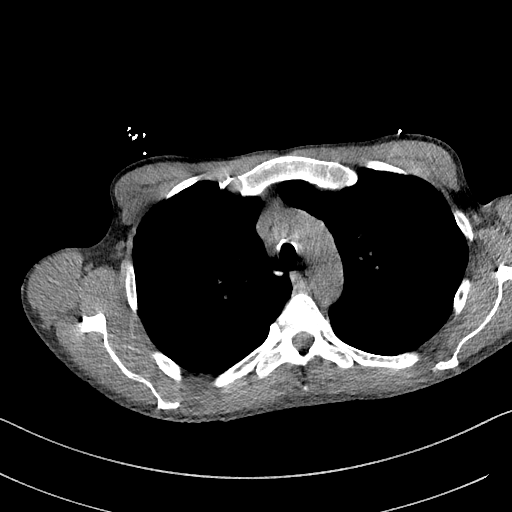
[im 131/184  lung]
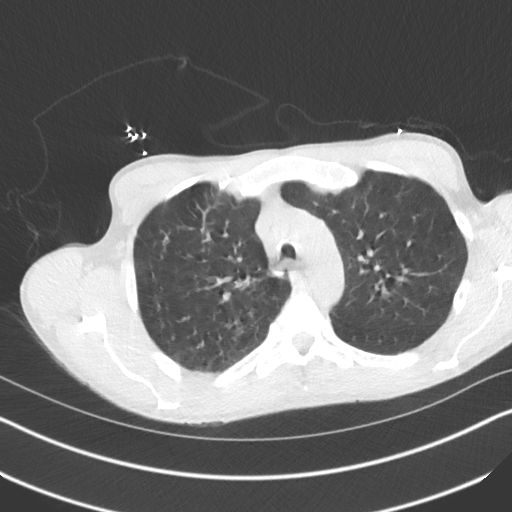
[im 144/184  lung]
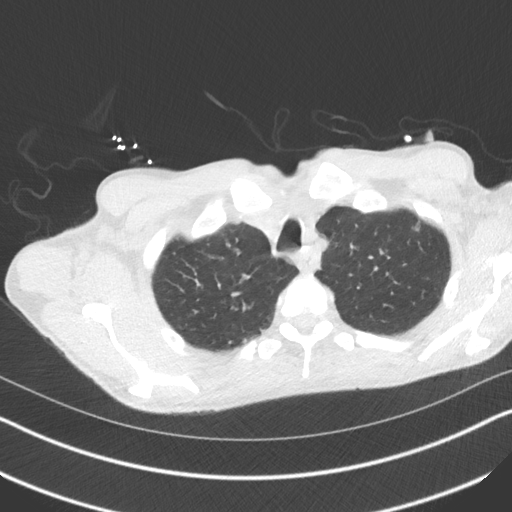
[im 157/184  lung]
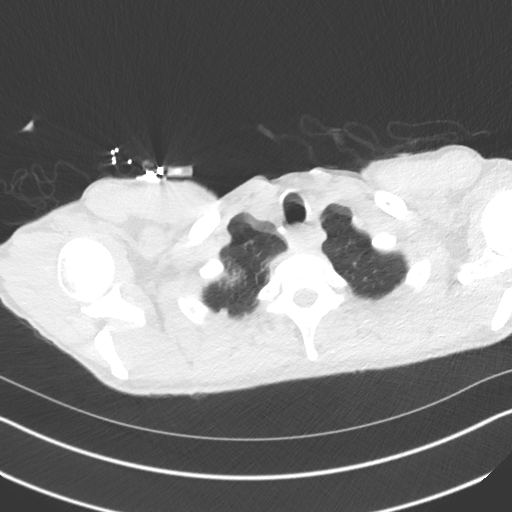
[im 170/184  lung]
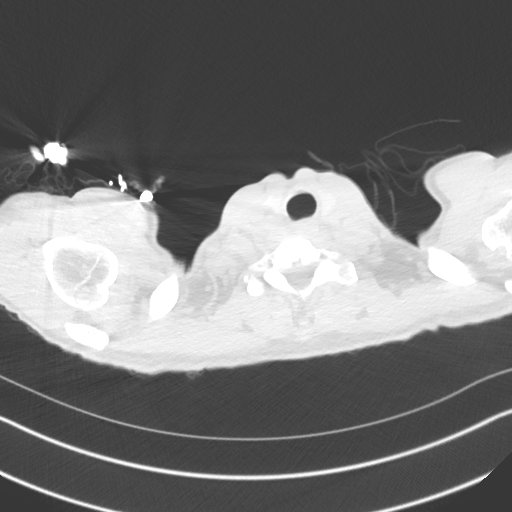

[15 of 36 positions shown; findings below may reference images not displayed]

FINDINGS: Examination is quite limited due to breathing motion artifact and
lack of IV contrast.

Cardiovascular: The heart is normal in size. No pericardial
effusion. The aorta is normal in caliber.

Mediastinum/Nodes: Progressive appearing subcarinal and right hilar
adenopathy. The esophagus is grossly normal.

Lungs/Pleura: Persistent extensive bronchial and peribronchial
disease in the right middle lobe and right lower lobe and to a
lesser extent the left lower lobe. Several of the lower lobe bronchi
are filled with fluid and debris. Findings likely persistent or
recurrent bronchopneumonia. Aspiration would be another
consideration. No associated pleural effusion. No worrisome
pulmonary lesions.

Improved cavitary lesions in the posterior segment of the right
upper lobe and also in the superior segment right lower lobe. Thin
walled cavities with no associated soft tissue component.

Upper Abdomen: No significant upper abdominal findings.

Musculoskeletal: No significant bony findings.
IMPRESSION: 1. Persistent fairly extensive bronchial and peribronchial disease
in the right lower lobe and to a lesser extent in the right middle
lobe and left lower lobe. This is likely persistent or recurrent
bronchopneumonia. Chronic aspiration is another possibility.
2. Slightly improved cavitary lesions in the right upper lobe and
superior segment of the right lower lobe.
3. Progressive appearing right hilar and subcarinal adenopathy.

## 2019-03-15 MED ORDER — SODIUM CHLORIDE 0.9 % IV SOLN
INTRAVENOUS | Status: DC
  Administered 2019-03-16: via INTRAVENOUS

## 2019-03-15 MED ORDER — ACETAMINOPHEN 500 MG PO TABS: 500.0000 mg | ORAL_TABLET | Freq: Four times a day (QID) | ORAL | Status: DC

## 2019-03-15 MED ORDER — BENZONATATE 100 MG PO CAPS
100.0000 mg | ORAL_CAPSULE | Freq: Three times a day (TID) | ORAL | Status: DC
  Administered 2019-03-15 – 2019-03-18 (×11): 100 mg via ORAL
  Filled 2019-03-15 (×12): qty 1

## 2019-03-15 MED ORDER — SODIUM CHLORIDE 0.9 % IV SOLN
INTRAVENOUS | Status: DC | PRN
  Administered 2019-03-15 – 2019-03-17 (×3): 1000 mL via INTRAVENOUS

## 2019-03-15 MED ORDER — SODIUM CHLORIDE 0.9 % IV BOLUS
1000.0000 mL | Freq: Once | INTRAVENOUS | Status: AC
  Administered 2019-03-15: 1000 mL via INTRAVENOUS

## 2019-03-15 MED ORDER — ENSURE ENLIVE PO LIQD
237.0000 mL | Freq: Two times a day (BID) | ORAL | Status: DC
  Administered 2019-03-16 – 2019-03-18 (×5): 237 mL via ORAL

## 2019-03-15 NOTE — Progress Notes (Signed)
   Subjective: Patient continues to complain of coughing.  He states the coughing is causing back pain and headaches and keeping him up at night.  Otherwise, the patient states that he seems to be doing well.  Still requiring supplemental oxygen otherwise he feels dyspneic.  Patient chills have resolved.  He denies chest pain or dizziness at this time.  Objective:  Vital signs in last 24 hours: Vitals:   03/14/19 0845 03/14/19 0900 03/15/19 0301 03/15/19 0843  BP:  94/62 99/69 (!) 90/58  Pulse: 94 91 82 80  Resp: 19 (!) _0 Temp:   98.3 F (36.8 C) 98.9 F (37.2 C)  TempSrc:   Oral Oral  SpO2: 94% 94% 95% 94%  Weight:   57.8 kg    Physical Exam  Constitutional: He is oriented to person, place, and time. No distress.  Eyes: EOM are normal.  Neck: Normal range of motion.  Cardiovascular: Normal rate, regular rhythm, normal heart sounds and intact distal pulses. Exam reveals no gallop and no friction rub.  No murmur heard. Pulmonary/Chest: He has rales in the right upper field, the right middle field and the right lower field.  Abdominal: Soft. There is no abdominal tenderness.  Lymphadenopathy:    He has no cervical adenopathy.  Neurological: He is alert and oriented to person, place, and time.  Skin: Skin is warm and dry. He is not diaphoretic.    [name] is a 43 year old male with a pertinent past medical history of HIV ( previousCD4 count <35 and viral load 367,000 on 07/01)GERD,anemia, EtOH use,hx. of oral thrush andpresented with a 3-week history of cough productive of yellow sputum with associated shortness of breath in the setting of in ability to take antiretroviral for the past 15 days is concerning opportunistic pneumonia. Patient was previously admitted to [facility] with treatment of a multifocal pneumonia due to [place].  Single dose of aztreonam and vancomycin were given in the ED.  10/05: Infectious disease was consulted at this time. ID recommended  continuing ceftriaxone and starting flagyl for multifocal pneumonia.  Start Biktarvy 50 mg/400 mg / 25 mg daily.  ID ordered a noncontrast CT to evaluate cavitary lesion.   10/06: AFB cultures pending.  Blood cultures pending.  CT chest shows likely persistent or recurrent bronchopneumonia versus chronic aspiration.  Slightly improved cavitary lesion in the right upper lobe and superior segment of right lower lobe.  Progressive appearing right hilar and subcarnial adenopathy/  Assessment/Plan:  Active Problems:   Thrush   AIDS (acquired immune deficiency syndrome) ([place])   Multifocal pneumonia  #Multifocal pneumonia: Patient requiring supplemental oxygen at 2 L nasal cannula.  Continues to have productive cough at this time.  Prescribed Tessalon twice daily.  Scheduled Tylenol for headache.  Waiting on AFB and blood cultures.  CT scan showed recurrent bronchopneumonia versus chronic aspiration.  We will continue ceftriaxone and Flagyl for empiric antibiotic coverage.   #HIV:  Patient's most recent CD4 count was less than 35 and RNA still pending.  He was recently restarted on Biktarvy per ID. We will continue HIV prophylaxis with Bactrim and treat oral thrush with Diflucan.Marland Kitchen  #Oral Candidiasis: - Continue to 200 mg of fluconazole.  Continue taking fluconazole as prescribed below.    Diet: Normal IVF: none VTE: Enoxaparin 40 mg Code: full  Dispo: Anticipated discharge pending clinical improvement.   Jon Payment, MD 03/15/2019, 3:36 PM Pager: [phone]

## 2019-03-15 NOTE — Consult Note (Signed)
Subjective: Patient seen at the bedside on rounds this morning.  Interaction was aided by [place] interpreter. He says he still having a productive cough, and asking what can be done about it.  We discussed the importance of collecting his sputum so we can run tests wanted to rule out tuberculosis and he is currently on antibiotics which should help over time.  Patient does not have any other complaints at this time.   Antibiotics:  Anti-infectives (From admission, onward)   Start     Dose/Rate Route Frequency Ordered Stop   03/14/19 1430  metroNIDAZOLE (FLAGYL) tablet 500 mg     500 mg Oral Every 6 hours 03/14/19 1429     03/14/19 1430  bictegravir-emtricitabine-tenofovir AF (BIKTARVY) 50-200-25 MG per tablet 1 tablet     1 tablet Oral Daily 03/14/19 1429     03/14/19 1000  fluconazole (DIFLUCAN) tablet 200 mg     200 mg Oral Daily 03/13/19 1932 03/28/19 0959   03/13/19 2145  sulfamethoxazole-trimethoprim (BACTRIM DS) 800-160 MG per tablet 1 tablet     1 tablet Oral Daily 03/13/19 2138     03/13/19 2100  cefTRIAXone (ROCEPHIN) 2 g in sodium chloride 0.9 % 100 mL IVPB     2 g 200 mL/hr over 30 Minutes Intravenous Every 24 hours 03/13/19 1932     03/13/19 2030  fluconazole (DIFLUCAN) tablet 400 mg     400 mg Oral  Once 03/13/19 1932 03/13/19 2058   03/13/19 2030  azithromycin (ZITHROMAX) tablet 500 mg  Status:  Discontinued     500 mg Oral Daily 03/13/19 1932 03/14/19 1429   03/13/19 1400  vancomycin (VANCOCIN) 1,250 mg in sodium chloride 0.9 % 250 mL IVPB     1,250 mg 166.7 mL/hr over 90 Minutes Intravenous  Once 03/13/19 1319 03/13/19 1630   03/13/19 1330  aztreonam (AZACTAM) 2 g in sodium chloride 0.9 % 100 mL IVPB     2 g 200 mL/hr over 30 Minutes Intravenous  Once 03/13/19 1317 03/13/19 1427     Medications: Scheduled Meds: . benzonatate  100 mg Oral TID  . bictegravir-emtricitabine-tenofovir AF  1 tablet Oral Daily  . enoxaparin (LOVENOX) injection  40 mg  Subcutaneous Q24H  . feeding supplement (ENSURE ENLIVE)  237 mL Oral BID BM  . fluconazole  200 mg Oral Daily  . metroNIDAZOLE  500 mg Oral Q6H  . sulfamethoxazole-trimethoprim  1 tablet Oral Daily   Continuous Infusions: . sodium chloride 75 mL/hr at 03/15/19 0304  . cefTRIAXone (ROCEPHIN)  IV Stopped (03/15/19 0149)   PRN Meds:.acetaminophen **OR** acetaminophen  Objective: Weight change:   Intake/Output Summary (Last 24 hours) at 03/15/2019 1106 Last data filed at 03/15/2019 0500 Gross per 24 hour  Intake 978.07 ml  Output -  Net 978.07 ml   Blood pressure (!) 90/58, pulse 80, temperature 98.9 F (37.2 C), temperature source Oral, resp. rate 18, weight 57.8 kg, SpO2 94 %. Temp:  [98.3 F (36.8 C)-98.9 F (37.2 C)] 98.9 F (37.2 C) (10/06 0843) Pulse Rate:  [80-82] 80 (10/06 0843) Resp:  [16-18] 18 (10/06 0843) BP: (90-99)/(58-69) 90/58 (10/06 0843) SpO2:  [94 %-95 %] 94 % (10/06 0843) Weight:  [57.8 kg] 57.8 kg (10/06 0301)  Physical Exam: General: Alert and awake, oriented x3, not in any acute distress. HEENT: NCAT, EOMI CV: RRR, normal S1 and S2, no murmurs rubs or gallops appreciated PULM: Normal work of breathing, clear to auscultation bilaterally, no wheezing  Abdomen: Soft and nontender Extremities: no edema or deformity noted bilaterally Skin: no rashes Neuro: Alert and oriented, no focal deficits  CBC:    Component Value Date/Time   WBC 6.0 03/15/2019 0338   RBC 4.78 03/15/2019 0338   HGB 12.2 (L) 03/15/2019 0338   HCT 37.0 (L) 03/15/2019 0338   PLT 365 03/15/2019 0338   MCV 77.4 (L) 03/15/2019 0338   MCV 79.9 (A) 09/17/2014 1105   MCH 25.5 (L) 03/15/2019 0338   MCHC 33.0 03/15/2019 0338   RDW 14.0 03/15/2019 0338   LYMPHSABS 2.0 03/14/2019 0450   MONOABS 0.8 03/14/2019 0450   EOSABS 0.3 03/14/2019 0450   BASOSABS 0.0 03/14/2019 0450   BMET Recent Labs    03/13/19 1221 03/14/19 0450  NA 130* 131*  K 4.6 4.1  CL 95* 100  CO2 23 21*   GLUCOSE 155* 99  BUN 7 6  CREATININE 0.86 0.80  CALCIUM 8.9 8.5*   Liver Panel Recent Labs    03/14/19 0450  PROT 5.5*  ALBUMIN 2.7*  AST 17  ALT 19  ALKPHOS 75  BILITOT 0.7   Micro Results: Recent Results (from the past 720 hour(s))  Blood culture (routine x 2)     Status: None (Preliminary result)   Collection Time: 03/13/19  1:00 PM   Specimen: BLOOD  Result Value Ref Range Status   Specimen Description BLOOD SITE NOT SPECIFIED  Final   Special Requests   Final    BOTTLES DRAWN AEROBIC ONLY Blood Culture results may not be optimal due to an inadequate volume of blood received in culture bottles   Culture   Final    NO GROWTH 1 DAY Performed at [facility], [place] [address] [place], [place] [address]    Report Status PENDING  Incomplete  Blood culture (routine x 2)     Status: None (Preliminary result)   Collection Time: 03/13/19  1:00 PM   Specimen: BLOOD  Result Value Ref Range Status   Specimen Description BLOOD SITE NOT SPECIFIED  Final   Special Requests   Final    BOTTLES DRAWN AEROBIC ONLY Blood Culture results may not be optimal due to an inadequate volume of blood received in culture bottles   Culture   Final    NO GROWTH 1 DAY Performed at [facility], [place] [address], [place], [place] [address]    Report Status PENDING  Incomplete  SARS Coronavirus 2 [facility] order, Performed in [facility] hospital lab) Nasopharyngeal Nasopharyngeal Swab     Status: None   Collection Time: 03/13/19  3:37 PM   Specimen: Nasopharyngeal Swab  Result Value Ref Range Status   SARS Coronavirus 2 NEGATIVE NEGATIVE Final    Comment: (NOTE) If result is NEGATIVE SARS-CoV-2 target nucleic acids are NOT DETECTED. The SARS-CoV-2 RNA is generally detectable in upper and lower  respiratory specimens during the acute phase of infection. The lowest  concentration of SARS-CoV-2 viral copies this assay can detect is 250  copies / mL. A negative result does not preclude  SARS-CoV-2 infection  and should not be used as the sole basis for treatment or other  patient management decisions.  A negative result may occur with  improper specimen collection / handling, submission of specimen other  than nasopharyngeal swab, presence of viral mutation(s) within the  areas targeted by this assay, and inadequate number of viral copies  (<250 copies / mL). A negative result must be combined with clinical  observations, patient history, and epidemiological information. If result is POSITIVE SARS-CoV-2 target nucleic acids are DETECTED. The SARS-CoV-2 RNA is generally detectable in upper and lower  respiratory specimens dur ing the acute phase of infection.  Positive  results are indicative of active infection with SARS-CoV-2.  Clinical  correlation with patient history and other diagnostic information is  necessary to determine patient infection status.  Positive results do  not rule out bacterial infection or co-infection with other viruses. If result is PRESUMPTIVE POSTIVE SARS-CoV-2 nucleic acids MAY BE PRESENT.   A presumptive positive result was obtained on the submitted specimen  and confirmed on repeat testing.  While 2019 novel coronavirus  (SARS-CoV-2) nucleic acids may be present in the submitted sample  additional confirmatory testing may be necessary for epidemiological  and / or clinical management purposes  to differentiate between  SARS-CoV-2 and other Sarbecovirus currently known to infect humans.  If clinically indicated additional testing with an alternate test  methodology [phone]) is advised. The SARS-CoV-2 RNA is generally  detectable in upper and lower respiratory sp ecimens during the acute  phase of infection. The expected result is Negative. Fact Sheet for Patients:  StrictlyIdeas.no Fact Sheet for Healthcare Providers: BankingDealers.co.za This test is not yet approved or cleared by the  [place] FDA and has been authorized for detection and/or diagnosis of SARS-CoV-2 by FDA under an Emergency Use Authorization (EUA).  This EUA will remain in effect (meaning this test can be used) for the duration of the COVID-19 declaration under Section 564(b)(1) of the Act, 21 U.S.C. section 360bbb-3(b)(1), unless the authorization is terminated or revoked sooner. Performed at [facility], [place] [address] [place], [place] [address]    Studies/Results: Dg Chest Portable 1 View  Result Date: 03/13/2019 CLINICAL DATA:  Shortness of breath. EXAM: PORTABLE CHEST 1 VIEW COMPARISON:  12/09/2018 FINDINGS: Lungs are adequately inflated with worsening airspace opacification over the right base and minimally over the left base. No effusion. Cardiomediastinal silhouette and remainder of the exam is unchanged. IMPRESSION: Worsening right base airspace process and minimal left base opacification compatible with multifocal pneumonia. Electronically Signed   By: Marin Olp M.D.   On: 03/13/2019 13:11   Assessment/Plan:  INTERVAL HISTORY: No significant events.  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 CXR findings consistent with multifocal pneumonia.  The patient's poor dentition and thrush suggests he may have aspiration pneumonia. Given HIV status, PCP and TB cannot be ruled out at this time.  1. Continue Ceftriaxone 2g q24 hrs, Flagyl 500 mg TID 2. Biktarvy 50 mg/200 mg/25 mg daily 3.   Follow up AFB blood cultures, AFB sputum cultures, smear, quantiferon TB 4.   Follow up CD4 count & HIV viral load 5.   Non-contrast CT Chest to evaluate cavitary lesion in RUL   LOS: 2 days    Earlene Plater 03/15/2019, 11:06 AM

## 2019-03-15 NOTE — Plan of Care (Signed)
  Problem: Activity: Goal: Risk for activity intolerance will decrease 03/15/2019 0502 by Barton Dubois, RN Outcome: Progressing 03/15/2019 0502 by Barton Dubois, RN Outcome: Progressing   Problem: Safety: Goal: Ability to remain free from injury will improve Outcome: Progressing

## 2019-03-16 DIAGNOSIS — R64 Cachexia: Secondary | ICD-10-CM

## 2019-03-16 DIAGNOSIS — Z603 Acculturation difficulty: Secondary | ICD-10-CM

## 2019-03-16 LAB — BASIC METABOLIC PANEL
Anion gap: 9 (ref 5–15)
BUN: 6 mg/dL (ref 6–20)
CO2: 25 mmol/L (ref 22–32)
Calcium: 8.3 mg/dL — ABNORMAL LOW (ref 8.9–10.3)
Chloride: 100 mmol/L (ref 98–111)
Creatinine, Ser: 0.8 mg/dL (ref 0.61–1.24)
GFR calc Af Amer: >60 mL/min (ref >60)
GFR calc non Af Amer: >60 mL/min (ref >60)
Glucose, Bld: 105 mg/dL — ABNORMAL HIGH (ref 70–99)
Potassium: 4.3 mmol/L (ref 3.5–5.1)
Sodium: 134 mmol/L — ABNORMAL LOW (ref 135–145)

## 2019-03-16 LAB — CBC
HCT: 35.1 % — ABNORMAL LOW (ref 39.0–52.0)
Hemoglobin: 12 g/dL — ABNORMAL LOW (ref 13.0–17.0)
MCH: 26.1 pg (ref 26.0–34.0)
MCHC: 34.2 g/dL (ref 30.0–36.0)
MCV: 76.3 fL — ABNORMAL LOW (ref 80.0–100.0)
Platelets: 365 10*3/uL (ref 150–400)
RBC: 4.6 MIL/uL (ref 4.22–5.81)
RDW: 13.9 % (ref 11.5–15.5)
WBC: 4.5 10*3/uL (ref 4.0–10.5)
nRBC: 0 % (ref 0.0–0.2)

## 2019-03-16 LAB — LACTIC ACID, PLASMA
Lactic Acid, Venous: 2.1 mmol/L (ref 0.5–1.9)
Lactic Acid, Venous: 1.3 mmol/L (ref 0.5–1.9)

## 2019-03-16 MED ORDER — SODIUM CHLORIDE 0.9 % IV BOLUS
1000.0000 mL | Freq: Once | INTRAVENOUS | Status: AC
  Administered 2019-03-16: 1000 mL via INTRAVENOUS

## 2019-03-16 NOTE — Progress Notes (Signed)
   Subjective: Patient was seen on rounds this morning.  He states that his cough has improved since starting on Tessalon.  He continues to have right shoulder pain at this time.  His shortness of breath has improved last 24 hours as well.  He is still on 2 L nasal cannula of supplemental oxygen. He denies chest pain, chills/night sweats, poor appetite, or dizziness at this time.  Objective:  Vital signs in last 24 hours: Vitals:   03/16/19 0010 03/16/19 0623 03/16/19 0736 03/16/19 1158  BP: 100/67 (!) 89/54 (!) 89/56 (!) 85/55  Pulse: 62 61 69 61  Resp: _0 Temp: 97.9 F (36.6 C) 98.4 F (36.9 C) 97.7 F (36.5 C) 98.3 F (36.8 C)  TempSrc: Oral Oral Oral Oral  SpO2: 100% 100% 100% 97%  Weight:  59.2 kg      Anti-microbial: 1. Aztreonam 2g IV (10/04) 2. Vancomycin 1,247m IV (10/04) 3. Azithromycin 5053mpo qd (10/04) 4. Ceftriaxone 2g IV (10/04 - Continued) 5. Metronidazole 50042m6h (10/05 - Continued) 6. Bactrim ppx 800-160 mg po qd (10/04 - Continued) 7. Fluconazole 400m15m (10/04) 8. Fluconazole 200mg53mqd (10/05 - Continued) 9. Biktarvy 50-200-25mg 29md (10/05 - Continued)  Cultures: 1. Blood Cultures (2x) - No Growth x2 days 2. AFB - Sputum collecte, result pending  Pertinent Labs/Images: Lactic Acid, Venous    Component Value Date/Time   LATICACIDVEN 2.1 (HH) 10[place]7/2020 0914    Patient Summary: [name] is a 83 yea43old male with a pertinent past medical history of HIV ( previousCD4 count <35and viral load 367,000 on 07/01)GERD,anemia, EtOH use,hx.of oral thrush andpresented with a 3-week history of cough productive of yellow sputum with associated shortness of breath in the setting of in ability to take antiretroviral for the past 15 days is concerning opportunistic pneumonia.[facility] [place] dose of aztreonam and vancomycin were given in the ED.   10/05:Infectious disease was consulted at this time.ID recommended continuing ceftriaxone and starting flagyl for multifocal pneumonia.Start Biktarvy 50 mg/400 mg / 25 mg daily. ID ordered a noncontrast CT to evaluate cavitary lesion.  10/06: AFB cultures pending.  Blood cultures pending.  CT chest shows likely persistent or recurrent bronchopneumonia versus chronic aspiration.  Slightly improved cavitary lesion in the right upper lobe and superior segment of right lower lobe.  Progressive appearing right hilar and subcarnial adenopathy.  10/07: Patient doing well. Cough has subsided. Patient had a small increase in lactic acid today at 2.1. Received bolus IV fluids over night. Gave another Liter of NS and we will recheck the lactic acid today. Waiting on AFB cultures. 1 of 4 AF cultures drawn today   Assessment/Plan:  Active Problems:   Thrush   AIDS (acquired immune deficiency syndrome) ([facility])  Alineltifocal pneumonia  #Multifocal pneumonia: patient is clinically improving.  Improvement of cough, shortness of breath, night sweats and chills.  - Waiting of AFB cultures and blood cultures - Continue Ceftriaxone and Metronidazole - Weaning supplemental O2 as tolerated. - Repeat LA - pending   #HIV: Patient tolerating HIV medications. - Continue Biktarvy   #Oral Candidiasis: He denies pain with swallowing. - Continue fluconazole   Diet: Normal IVF: None VTE: Enoxaparin 40 mg Code: Full  Dispo: Anticipated discharge pending clinical improvement.  Jon Love, BMarianna Payment0/12/2018, 12:16 PM Pager: [phone]

## 2019-03-16 NOTE — Plan of Care (Signed)
  Problem: Clinical Measurements: Goal: Respiratory complications will improve Outcome: Progressing   

## 2019-03-16 NOTE — Consult Note (Signed)
Subjective: Patient seen at the bedside on rounds this morning.  Interaction was aided by [place] interpreter. The patient says his cough is improved today. He says he slept well. No other complaints at this time.   Antibiotics:  Anti-infectives (From admission, onward)   Start     Dose/Rate Route Frequency Ordered Stop   03/14/19 1430  metroNIDAZOLE (FLAGYL) tablet 500 mg     500 mg Oral Every 6 hours 03/14/19 1429     03/14/19 1430  bictegravir-emtricitabine-tenofovir AF (BIKTARVY) 50-200-25 MG per tablet 1 tablet     1 tablet Oral Daily 03/14/19 1429     03/14/19 1000  fluconazole (DIFLUCAN) tablet 200 mg     200 mg Oral Daily 03/13/19 1932 03/28/19 0959   03/13/19 2145  sulfamethoxazole-trimethoprim (BACTRIM DS) 800-160 MG per tablet 1 tablet     1 tablet Oral Daily 03/13/19 2138     03/13/19 2100  cefTRIAXone (ROCEPHIN) 2 g in sodium chloride 0.9 % 100 mL IVPB     2 g 200 mL/hr over 30 Minutes Intravenous Every 24 hours 03/13/19 1932     03/13/19 2030  fluconazole (DIFLUCAN) tablet 400 mg     400 mg Oral  Once 03/13/19 1932 03/13/19 2058   03/13/19 2030  azithromycin (ZITHROMAX) tablet 500 mg  Status:  Discontinued     500 mg Oral Daily 03/13/19 1932 03/14/19 1429   03/13/19 1400  vancomycin (VANCOCIN) 1,250 mg in sodium chloride 0.9 % 250 mL IVPB     1,250 mg 166.7 mL/hr over 90 Minutes Intravenous  Once 03/13/19 1319 03/13/19 1630   03/13/19 1330  aztreonam (AZACTAM) 2 g in sodium chloride 0.9 % 100 mL IVPB     2 g 200 mL/hr over 30 Minutes Intravenous  Once 03/13/19 1317 03/13/19 1427     Medications: Scheduled Meds:  benzonatate  100 mg Oral TID   bictegravir-emtricitabine-tenofovir AF  1 tablet Oral Daily   enoxaparin (LOVENOX) injection  40 mg Subcutaneous Q24H   feeding supplement (ENSURE ENLIVE)  237 mL Oral BID BM   fluconazole  200 mg Oral Daily   metroNIDAZOLE  500 mg Oral Q6H   sulfamethoxazole-trimethoprim  1 tablet Oral Daily   Continuous  Infusions:  sodium chloride 1,000 mL (03/15/19 2236)   cefTRIAXone (ROCEPHIN)  IV 2 g (03/15/19 2237)   PRN Meds:.sodium chloride, acetaminophen **OR** acetaminophen  Objective: Weight change: 1.361 kg  Intake/Output Summary (Last 24 hours) at 03/16/2019 1440 Last data filed at 03/16/2019 1226 Gross per 24 hour  Intake 4147.05 ml  Output 550 ml  Net 3597.05 ml   Blood pressure (!) 85/55, pulse 61, temperature 98.3 F (36.8 C), temperature source Oral, resp. rate 18, weight 59.2 kg, SpO2 97 %. Temp:  [97.7 F (36.5 C)-98.7 F (37.1 C)] 98.3 F (36.8 C) (10/07 1158) Pulse Rate:  [61-69] 61 (10/07 1158) Resp:  [18] 18 (10/07 1158) BP: (85-100)/(54-67) 85/55 (10/07 1158) SpO2:  [97 %-100 %] 97 % (10/07 1158) Weight:  [59.2 kg] 59.2 kg (10/07 5537)  Physical Exam: General: Resting in bed comfortably, NAD HEENT: NCAT, EOMI PULM: Normal work of breathing Extremities: no edema or deformity noted bilaterally Skin: no rashes Neuro: Alert and oriented, no focal deficits  CBC:    Component Value Date/Time   WBC 4.5 03/16/2019 0338   RBC 4.60 03/16/2019 0338   HGB 12.0 (L) 03/16/2019 0338   HCT 35.1 (L) 03/16/2019 0338   PLT 365 03/16/2019 0338  MCV 76.3 (L) 03/16/2019 0338   MCV 79.9 (A) 09/17/2014 1105   MCH 26.1 03/16/2019 0338   MCHC 34.2 03/16/2019 0338   RDW 13.9 03/16/2019 0338   LYMPHSABS 2.0 03/14/2019 0450   MONOABS 0.8 03/14/2019 0450   EOSABS 0.3 03/14/2019 0450   BASOSABS 0.0 03/14/2019 0450   BMET Recent Labs    03/14/19 0450 03/16/19 0338  NA 131* 134*  K 4.1 4.3  CL 100 100  CO2 21* 25  GLUCOSE 99 105*  BUN 6 6  CREATININE 0.80 0.80  CALCIUM 8.5* 8.3*   Liver Panel Recent Labs    03/14/19 0450  PROT 5.5*  ALBUMIN 2.7*  AST 17  ALT 19  ALKPHOS 75  BILITOT 0.7   Micro Results: Recent Results (from the past 720 hour(s))  Blood culture (routine x 2)     Status: None (Preliminary result)   Collection Time: 03/13/19  1:00 PM   Specimen:  BLOOD  Result Value Ref Range Status   Specimen Description BLOOD SITE NOT SPECIFIED  Final   Special Requests   Final    BOTTLES DRAWN AEROBIC ONLY Blood Culture results may not be optimal due to an inadequate volume of blood received in culture bottles   Culture   Final    NO GROWTH 3 DAYS Performed at [facility], [place] [address], [place], [place] [address]    Report Status PENDING  Incomplete  Blood culture (routine x 2)     Status: None (Preliminary result)   Collection Time: 03/13/19  1:00 PM   Specimen: BLOOD  Result Value Ref Range Status   Specimen Description BLOOD SITE NOT SPECIFIED  Final   Special Requests   Final    BOTTLES DRAWN AEROBIC ONLY Blood Culture results may not be optimal due to an inadequate volume of blood received in culture bottles   Culture   Final    NO GROWTH 3 DAYS Performed at [facility], [place] [address] [place], [place] [address]    Report Status PENDING  Incomplete  SARS Coronavirus 2 [facility] order, Performed in [facility] hospital lab) Nasopharyngeal Nasopharyngeal Swab     Status: None   Collection Time: 03/13/19  3:37 PM   Specimen: Nasopharyngeal Swab  Result Value Ref Range Status   SARS Coronavirus 2 NEGATIVE NEGATIVE Final    Comment: (NOTE) If result is NEGATIVE SARS-CoV-2 target nucleic acids are NOT DETECTED. The SARS-CoV-2 RNA is generally detectable in upper and lower  respiratory specimens during the acute phase of infection. The lowest  concentration of SARS-CoV-2 viral copies this assay can detect is 250  copies / mL. A negative result does not preclude SARS-CoV-2 infection  and should not be used as the sole basis for treatment or other  patient management decisions.  A negative result may occur with  improper specimen collection / handling, submission of specimen other  than nasopharyngeal swab, presence of viral mutation(s) within the  areas targeted by this assay, and inadequate number of viral copies    (<250 copies / mL). A negative result must be combined with clinical  observations, patient history, and epidemiological information. If result is POSITIVE SARS-CoV-2 target nucleic acids are DETECTED. The SARS-CoV-2 RNA is generally detectable in upper and lower  respiratory specimens dur ing the acute phase of infection.  Positive  results are indicative of active infection with SARS-CoV-2.  Clinical  correlation with patient history and other diagnostic information is  necessary to determine  patient infection status.  Positive results do  not rule out bacterial infection or co-infection with other viruses. If result is PRESUMPTIVE POSTIVE SARS-CoV-2 nucleic acids MAY BE PRESENT.   A presumptive positive result was obtained on the submitted specimen  and confirmed on repeat testing.  While 2019 novel coronavirus  (SARS-CoV-2) nucleic acids may be present in the submitted sample  additional confirmatory testing may be necessary for epidemiological  and / or clinical management purposes  to differentiate between  SARS-CoV-2 and other Sarbecovirus currently known to infect humans.  If clinically indicated additional testing with an alternate test  methodology [phone]) is advised. The SARS-CoV-2 RNA is generally  detectable in upper and lower respiratory sp ecimens during the acute  phase of infection. The expected result is Negative. Fact Sheet for Patients:  StrictlyIdeas.no Fact Sheet for Healthcare Providers: BankingDealers.co.za This test is not yet approved or cleared by the [place] FDA and has been authorized for detection and/or diagnosis of SARS-CoV-2 by FDA under an Emergency Use Authorization (EUA).  This EUA will remain in effect (meaning this test can be used) for the duration of the COVID-19 declaration under Section 564(b)(1) of the Act, 21 U.S.C. section 360bbb-3(b)(1), unless the authorization is terminated  or revoked sooner. Performed at [facility], [place] [address] [place], [place] [address]    Studies/Results: Ct Chest Wo Contrast  Result Date: 03/15/2019 CLINICAL DATA:  HIV positive with persistent pneumonia. EXAM: CT CHEST WITHOUT CONTRAST TECHNIQUE: Multidetector CT imaging of the chest was performed following the standard protocol without IV contrast. COMPARISON:  Chest x-ray 03/13/2019 and chest CT 12/06/2018 FINDINGS: Examination is quite limited due to breathing motion artifact and lack of IV contrast. Cardiovascular: The heart is normal in size. No pericardial effusion. The aorta is normal in caliber. Mediastinum/Nodes: Progressive appearing subcarinal and right hilar adenopathy. The esophagus is grossly normal. Lungs/Pleura: Persistent extensive bronchial and peribronchial disease in the right middle lobe and right lower lobe and to a lesser extent the left lower lobe. Several of the lower lobe bronchi are filled with fluid and debris. Findings likely persistent or recurrent bronchopneumonia. Aspiration would be another consideration. No associated pleural effusion. No worrisome pulmonary lesions. Improved cavitary lesions in the posterior segment of the right upper lobe and also in the superior segment right lower lobe. Thin walled cavities with no associated soft tissue component. Upper Abdomen: No significant upper abdominal findings. Musculoskeletal: No significant bony findings. IMPRESSION: 1. Persistent fairly extensive bronchial and peribronchial disease in the right lower lobe and to a lesser extent in the right middle lobe and left lower lobe. This is likely persistent or recurrent bronchopneumonia. Chronic aspiration is another possibility. 2. Slightly improved cavitary lesions in the right upper lobe and superior segment of the right lower lobe. 3. Progressive appearing right hilar and subcarinal adenopathy. Electronically Signed   By: Marijo Sanes M.D.   On: 03/15/2019 11:43    Assessment/Plan:  INTERVAL HISTORY: No significant events.  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 CXR findings consistent with multifocal pneumonia. The patient's poor dentition and thrush suggests he may have aspiration pneumonia. Given HIV status, PCP and TB cannot be  ruled out at this time, although I do have a low suspicion.  1. Continue Ceftriaxone 2g q24 hrs, Flagyl 500 mg TID 2. Biktarvy 50 mg/200 mg/25 mg daily 3.   Follow up AFB blood cultures, AFB sputum cultures & smear   LOS: 3 days   Earlene Plater 03/16/2019, 2:40 PM

## 2019-03-16 NOTE — Plan of Care (Signed)
  Problem: Education: Goal: Knowledge of General Education information will improve Description: Including pain rating scale, medication(s)/side effects and non-pharmacologic comfort measures Outcome: Progressing   Problem: Health Behavior/Discharge Planning: Goal: Ability to manage health-related needs will improve Outcome: Progressing   Problem: Clinical Measurements: Goal: Respiratory complications will improve Outcome: Progressing   

## 2019-03-17 ENCOUNTER — Telehealth: Payer: Self-pay | Admitting: Infectious Disease

## 2019-03-17 DIAGNOSIS — M25519 Pain in unspecified shoulder: Secondary | ICD-10-CM

## 2019-03-17 DIAGNOSIS — B20 Human immunodeficiency virus [HIV] disease: Secondary | ICD-10-CM

## 2019-03-17 LAB — CBC
HCT: 37.2 % — ABNORMAL LOW (ref 39.0–52.0)
Hemoglobin: 12.4 g/dL — ABNORMAL LOW (ref 13.0–17.0)
MCH: 25.5 pg — ABNORMAL LOW (ref 26.0–34.0)
MCHC: 33.3 g/dL (ref 30.0–36.0)
MCV: 76.4 fL — ABNORMAL LOW (ref 80.0–100.0)
Platelets: 430 10*3/uL — ABNORMAL HIGH (ref 150–400)
RBC: 4.87 MIL/uL (ref 4.22–5.81)
RDW: 14.1 % (ref 11.5–15.5)
WBC: 4.6 10*3/uL (ref 4.0–10.5)
nRBC: 0 % (ref 0.0–0.2)

## 2019-03-17 LAB — ACID FAST SMEAR (AFB, MYCOBACTERIA): Acid Fast Smear: NEGATIVE

## 2019-03-17 MED ORDER — FLUCONAZOLE 100 MG PO TABS: 100.0000 mg | ORAL_TABLET | Freq: Every day | ORAL | 1 refills | Status: DC

## 2019-03-17 MED ORDER — BIKTARVY 50-200-25 MG PO TABS: 1.0000 | ORAL_TABLET | Freq: Every day | ORAL | 5 refills | Status: DC

## 2019-03-17 MED ORDER — SULFAMETHOXAZOLE-TRIMETHOPRIM 400-80 MG PO TABS: 1.0000 | ORAL_TABLET | Freq: Every day | ORAL | 4 refills | Status: DC

## 2019-03-17 MED ORDER — INFLUENZA VAC SPLIT QUAD 0.5 ML IM SUSY
0.5000 mL | PREFILLED_SYRINGE | INTRAMUSCULAR | Status: AC
  Administered 2019-03-18: 0.5 mL via INTRAMUSCULAR
  Filled 2019-03-17: qty 0.5

## 2019-03-17 MED ORDER — PNEUMOCOCCAL VAC POLYVALENT 25 MCG/0.5ML IJ INJ
0.5000 mL | INJECTION | INTRAMUSCULAR | Status: AC
  Administered 2019-03-18: 0.5 mL via INTRAMUSCULAR
  Filled 2019-03-17: qty 0.5

## 2019-03-17 NOTE — Plan of Care (Signed)

## 2019-03-17 NOTE — Consult Note (Addendum)
Subjective: Patient seen at the bedside on rounds this morning. Interaction was aided by [place] interpreter. The pt says his cough continues to improve. He coughs occasionally, but when he does,no sputum is coming up anymore. We discussed how Jon Love will be by later this weak to discuss his HIV medications and following up in the ID clinic. All questions were answered.  Antibiotics:  Anti-infectives (From admission, onward)   Start     Dose/Rate Route Frequency Ordered Stop   03/14/19 1430  metroNIDAZOLE (FLAGYL) tablet 500 mg     500 mg Oral Every 6 hours 03/14/19 1429     03/14/19 1430  bictegravir-emtricitabine-tenofovir AF (BIKTARVY) 50-200-25 MG per tablet 1 tablet     1 tablet Oral Daily 03/14/19 1429     03/14/19 1000  fluconazole (DIFLUCAN) tablet 200 mg     200 mg Oral Daily 03/13/19 1932 03/28/19 0959   03/13/19 2145  sulfamethoxazole-trimethoprim (BACTRIM DS) 800-160 MG per tablet 1 tablet     1 tablet Oral Daily 03/13/19 2138     03/13/19 2100  cefTRIAXone (ROCEPHIN) 2 g in sodium chloride 0.9 % 100 mL IVPB     2 g 200 mL/hr over 30 Minutes Intravenous Every 24 hours 03/13/19 1932     03/13/19 2030  fluconazole (DIFLUCAN) tablet 400 mg     400 mg Oral  Once 03/13/19 1932 03/13/19 2058   03/13/19 2030  azithromycin (ZITHROMAX) tablet 500 mg  Status:  Discontinued     500 mg Oral Daily 03/13/19 1932 03/14/19 1429   03/13/19 1400  vancomycin (VANCOCIN) 1,250 mg in sodium chloride 0.9 % 250 mL IVPB     1,250 mg 166.7 mL/hr over 90 Minutes Intravenous  Once 03/13/19 1319 03/13/19 1630   03/13/19 1330  aztreonam (AZACTAM) 2 g in sodium chloride 0.9 % 100 mL IVPB     2 g 200 mL/hr over 30 Minutes Intravenous  Once 03/13/19 1317 03/13/19 1427     Medications: Scheduled Meds:  benzonatate  100 mg Oral TID   bictegravir-emtricitabine-tenofovir AF  1 tablet Oral Daily   enoxaparin (LOVENOX) injection  40 mg Subcutaneous Q24H   feeding supplement (ENSURE ENLIVE)   237 mL Oral BID BM   fluconazole  200 mg Oral Daily   [START ON 03/18/2019] influenza vac split quadrivalent PF  0.5 mL Intramuscular Tomorrow-1000   metroNIDAZOLE  500 mg Oral Q6H   [START ON 03/18/2019] pneumococcal 23 valent vaccine  0.5 mL Intramuscular Tomorrow-1000   sulfamethoxazole-trimethoprim  1 tablet Oral Daily   Continuous Infusions:  sodium chloride 10 mL/hr at 03/17/19 0059   cefTRIAXone (ROCEPHIN)  IV Stopped (03/16/19 2224)   PRN Meds:.sodium chloride, acetaminophen **OR** acetaminophen  Objective: Weight change:   Intake/Output Summary (Last 24 hours) at 03/17/2019 1059 Last data filed at 03/17/2019 0826 Gross per 24 hour  Intake 1385.85 ml  Output 700 ml  Net 685.85 ml   Blood pressure 94/64, pulse 70, temperature 98.2 F (36.8 C), temperature source Oral, resp. rate 20, height 5' 10.87" (1.8 m), weight 58.4 kg, SpO2 96 %. Temp:  [97.7 F (36.5 C)-98.7 F (37.1 C)] 98.2 F (36.8 C) (10/08 0752) Pulse Rate:  [61-70] 70 (10/08 0752) Resp:  [18-20] 20 (10/08 0446) BP: (85-99)/(55-66) 94/64 (10/08 0752) SpO2:  [95 %-98 %] 96 % (10/08 0752) Weight:  [58.4 kg] 58.4 kg (10/08 0807)  Physical Exam: General: NAD HEENT: NCAT, EOMI PULM: CTAB Extremities: No edema or deformity noted bilaterally  Skin: No rashes Neuro: Alert and oriented, no focal deficits  CBC:    Component Value Date/Time   WBC 4.6 03/17/2019 0659   RBC 4.87 03/17/2019 0659   HGB 12.4 (L) 03/17/2019 0659   HCT 37.2 (L) 03/17/2019 0659   PLT 430 (H) 03/17/2019 0659   MCV 76.4 (L) 03/17/2019 0659   MCV 79.9 (A) 09/17/2014 1105   MCH 25.5 (L) 03/17/2019 0659   MCHC 33.3 03/17/2019 0659   RDW 14.1 03/17/2019 0659   LYMPHSABS 2.0 03/14/2019 0450   MONOABS 0.8 03/14/2019 0450   EOSABS 0.3 03/14/2019 0450   BASOSABS 0.0 03/14/2019 0450   BMET Recent Labs    03/16/19 0338  NA 134*  K 4.3  CL 100  CO2 25  GLUCOSE 105*  BUN 6  CREATININE 0.80  CALCIUM 8.3*   Liver Panel No  results for input(s): PROT, ALBUMIN, AST, ALT, ALKPHOS, BILITOT, BILIDIR, IBILI in the last 72 hours. Micro Results: Recent Results (from the past 720 hour(s))  Blood culture (routine x 2)     Status: None (Preliminary result)   Collection Time: 03/13/19  1:00 PM   Specimen: BLOOD  Result Value Ref Range Status   Specimen Description BLOOD SITE NOT SPECIFIED  Final   Special Requests   Final    BOTTLES DRAWN AEROBIC ONLY Blood Culture results may not be optimal due to an inadequate volume of blood received in culture bottles   Culture   Final    NO GROWTH 3 DAYS Performed at [facility], [place] [address], [place], [place] [address]    Report Status PENDING  Incomplete  Blood culture (routine x 2)     Status: None (Preliminary result)   Collection Time: 03/13/19  1:00 PM   Specimen: BLOOD  Result Value Ref Range Status   Specimen Description BLOOD SITE NOT SPECIFIED  Final   Special Requests   Final    BOTTLES DRAWN AEROBIC ONLY Blood Culture results may not be optimal due to an inadequate volume of blood received in culture bottles   Culture   Final    NO GROWTH 3 DAYS Performed at [facility], [place] [address] [place], [place] [address]    Report Status PENDING  Incomplete  SARS Coronavirus 2 [facility] order, Performed in [facility] hospital lab) Nasopharyngeal Nasopharyngeal Swab     Status: None   Collection Time: 03/13/19  3:37 PM   Specimen: Nasopharyngeal Swab  Result Value Ref Range Status   SARS Coronavirus 2 NEGATIVE NEGATIVE Final    Comment: (NOTE) If result is NEGATIVE SARS-CoV-2 target nucleic acids are NOT DETECTED. The SARS-CoV-2 RNA is generally detectable in upper and lower  respiratory specimens during the acute phase of infection. The lowest  concentration of SARS-CoV-2 viral copies this assay can detect is 250  copies / mL. A negative result does not preclude SARS-CoV-2 infection  and should not be used as the sole basis for treatment or other    patient management decisions.  A negative result may occur with  improper specimen collection / handling, submission of specimen other  than nasopharyngeal swab, presence of viral mutation(s) within the  areas targeted by this assay, and inadequate number of viral copies  (<250 copies / mL). A negative result must be combined with clinical  observations, patient history, and epidemiological information. If result is POSITIVE SARS-CoV-2 target nucleic acids are DETECTED. The SARS-CoV-2 RNA is generally detectable in upper and lower  respiratory specimens  dur ing the acute phase of infection.  Positive  results are indicative of active infection with SARS-CoV-2.  Clinical  correlation with patient history and other diagnostic information is  necessary to determine patient infection status.  Positive results do  not rule out bacterial infection or co-infection with other viruses. If result is PRESUMPTIVE POSTIVE SARS-CoV-2 nucleic acids MAY BE PRESENT.   A presumptive positive result was obtained on the submitted specimen  and confirmed on repeat testing.  While 2019 novel coronavirus  (SARS-CoV-2) nucleic acids may be present in the submitted sample  additional confirmatory testing may be necessary for epidemiological  and / or clinical management purposes  to differentiate between  SARS-CoV-2 and other Sarbecovirus currently known to infect humans.  If clinically indicated additional testing with an alternate test  methodology [phone]) is advised. The SARS-CoV-2 RNA is generally  detectable in upper and lower respiratory sp ecimens during the acute  phase of infection. The expected result is Negative. Fact Sheet for Patients:  StrictlyIdeas.no Fact Sheet for Healthcare Providers: BankingDealers.co.za This test is not yet approved or cleared by the [place] FDA and has been authorized for detection and/or diagnosis of SARS-CoV-2  by FDA under an Emergency Use Authorization (EUA).  This EUA will remain in effect (meaning this test can be used) for the duration of the COVID-19 declaration under Section 564(b)(1) of the Act, 21 U.S.C. section 360bbb-3(b)(1), unless the authorization is terminated or revoked sooner. Performed at [facility], [place] [address], [place], [place] [address]   Acid Fast Smear (AFB)     Status: None   Collection Time: 03/16/19  6:38 AM   Specimen: Sputum  Result Value Ref Range Status   AFB Specimen Processing Concentration  Final   Acid Fast Smear Negative  Final    Comment: (NOTE) Performed At: [facility] [place], [place] [id number] Rush Farmer MD CB:[id number]    Source (AFB) SPUTUM  Final    Comment: Performed at [facility], [place] [address], [place], [place] [address]   Studies/Results: No results found. Assessment/Plan:  INTERVAL HISTORY: No significant events.  Active Problems:   Thrush   AIDS (acquired immune deficiency syndrome) ([place])   Multifocal pneumonia   Immigrant with language difficulty   Cachexia ([place])  In summary, 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 CXR findings consistent with multifocal pneumonia. The patient's poor dentition and thrush suggests he may have aspiration pneumonia. Given HIV status, PCP and TB cannot be ruled out at this time, although I do have a low suspicion.   1. Continue Ceftriaxone 2g q24 hrs, Flagyl 500 mg TID 2. Biktarvy 50 mg/200 mg/25 mg daily 3.  Follow up AFB blood cultures, AFB sputum cultures & smear.  4. Smear negative for mycobacterium, can d/c airborne precuations   LOS: 4 days   Earlene Plater, MD Internal Medicine, PGY1 Pager: [phone]  03/17/2019,11:09 AM

## 2019-03-17 NOTE — Progress Notes (Signed)
Subjective: Patient continues to improve. Contiues to have upper back pain that is tender to palpation and worse with movement. Otherwise, patient denies any new symptoms. He states that his cough and SHOB has improved on RA.  Objective:  Vital signs in last 24 hours: Vitals:   03/17/19 0648 03/17/19 0752 03/17/19 0807 03/17/19 1203  BP:  94/64  97/67  Pulse:  70  73  Resp:    18  Temp:  98.2 F (36.8 C)  98.2 F (36.8 C)  TempSrc:  Oral  Oral  SpO2:  96%  96%  Weight:   58.4 kg   Height: 5' 10.87" (1.8 m)      Physical Exam  Constitutional: He is oriented to person, place, and time. No distress.  Eyes: EOM are normal.  Neck: Normal range of motion.  Cardiovascular: Normal rate, regular rhythm, normal heart sounds and intact distal pulses. Exam reveals no gallop and no friction rub.  No murmur heard. Pulmonary/Chest: Effort normal. No respiratory distress. He has rales (right side). He exhibits no tenderness.  Abdominal: Soft. Bowel sounds are normal. There is no abdominal tenderness.  Musculoskeletal:        General: Tenderness (upper back pain) present.  Neurological: He is alert and oriented to person, place, and time.  Skin: Skin is warm and dry. He is not diaphoretic.    Anti-microbials: 1. Aztreonam 2g IV (10/04) 2. Vancomycin 1,239m IV (10/04) 3. Azithromycin 501mpo qd (10/04) 4. Ceftriaxone 2g IV (10/04 - Continued) 5. Metronidazole 50039m6h (10/05 - Continued) 6. Bactrim ppx 800-160 mg po qd (10/04 - Continued) 7. Fluconazole 400m34m (10/04) 8. Fluconazole 200mg104mqd (10/05 - Continued) 9. Biktarvy 50-200-25mg 8md (10/05 - Continued)  Cultures: 1. AFB: pending 2. BC: no growth 4 days  Pertinent labs/Imaging:  Lactic Acid, Venous    Component Value Date/Time   LATICACIDVEN 1.3 03/16/2019 1229     CBC Latest Ref Rng & Units 03/17/2019 03/16/2019 03/15/2019  WBC 4.0 - 10.5 K/uL 4.6 4.5 6.0  Hemoglobin 13.0 - 17.0 g/dL 12.4(L) 12.0(L) 12.2(L)   Hematocrit 39.0 - 52.0 % 37.2(L) 35.1(L) 37.0(L)  Platelets 150 - 400 K/uL 430(H) 365 365    Patient Summary: [name] is a 83 yea71old male with a pertinent past medical history of HIV ( previousCD4 count <35and viral load 367,000 on 07/01)GERD,anemia, EtOH use,hx.of oral thrush andpresented with a 3-week history of cough productive of yellow sputum with associated shortness of breath in the setting of in ability to take antiretroviral for the past 15 days is concerning opportunistic pneumonia.[facility] due to [facility].SiWinter [place] dose of aztreonam and vancomycin were given in the ED.  10/05:Infectious disease was consulted at this time.ID recommended continuing ceftriaxone and starting flagyl for multifocal pneumonia.Start Biktarvy 50 mg/400 mg / 25 mg daily. ID ordered a noncontrast CT to evaluate cavitary lesion.  10/06:AFB cultures pending. Blood cultures pending. CT chest shows likely persistent or recurrent bronchopneumoniaversus chronic aspiration. Slightly improved cavitary lesion in the right upper lobe and superior segment of right lower lobe. Progressive appearing right hilar and subcarnialadenopathy.  10/07: Patient doing well. Cough has subsided. Patient had a small increase in lactic acid today at 2.1. Received bolus IV fluids over night. Gave another Liter of NS and we will recheck the lactic acid today. Waiting on AFB cultures. 1 of 4 AF cultures drawn today.  10/08: Patient doing well. Waiting on AFB culture. Patient set  up to receive HIV medications prior to discharge.  Assessment/Plan:  Active Problems:   Thrush   AIDS (acquired immune deficiency syndrome) ([place])   Multifocal pneumonia   Immigrant with language difficulty   Cachexia ([place])  #Multifocal pneumonia:  - Patient continues to improve. Admits to back pain that is likely MSK in nature. Continues to have improved cough,  SHOB, fevers, night sweats.  patient is clinically improving.  Improvement of cough, shortness of breath, night sweats and chills.  - MAC infection is less likely, waiting on AFB before about to discharge. - Continue Ceftriaxone and Metro. - Patient weaned off oxygen and saturating well.   #HIV: Patient tolerating HIV medications. - Continue Biktarvy   #Oral Candidiasis: He denies pain with swallowing. - Continue fluconazole   #Back pain: - prn tylenol  Diet: Normal IVF: None VTE: Enoxaparin 40 mg Code: Full  Dispo: Anticipated discharge AFB culture.   Marianna Payment, MD 03/17/2019, 2:33 PM Pager: [phone]

## 2019-03-17 NOTE — Telephone Encounter (Signed)
I am going to send Jon Love's medicines to the [facility] on [address].  Jon Love will pick them up tomorrow and deliver them to our ID pharmacist  Jon Love and Jon Love can go over the medications with him.  Jon Love will also enroll him in his program.  Also going to try to get him engaged with Jon Love again.  He has significant transportation issues and apparently in the past is had difficulty with drugs being delivered to him which is his preference.  Hopefully we can troubleshoot this and get him onto medications reliably.  I also mentioned the idea of the clinical trial but again he would need transportation to come to our clinical trial for nonadherent patients  I was also planning on giving him cefdinir to treat his pneumonia but not sure if on [facility] formulary or if should gert from [place]?

## 2019-03-18 LAB — CULTURE, BLOOD (ROUTINE X 2)
Culture: NO GROWTH
Culture: NO GROWTH

## 2019-03-18 LAB — CBC
HCT: 41.4 % (ref 39.0–52.0)
Hemoglobin: 13.6 g/dL (ref 13.0–17.0)
MCH: 25.2 pg — ABNORMAL LOW (ref 26.0–34.0)
MCHC: 32.9 g/dL (ref 30.0–36.0)
MCV: 76.7 fL — ABNORMAL LOW (ref 80.0–100.0)
Platelets: 483 10*3/uL — ABNORMAL HIGH (ref 150–400)
RBC: 5.4 MIL/uL (ref 4.22–5.81)
RDW: 14.1 % (ref 11.5–15.5)
WBC: 5.2 10*3/uL (ref 4.0–10.5)
nRBC: 0 % (ref 0.0–0.2)

## 2019-03-18 MED ORDER — CEFDINIR 300 MG PO CAPS: 300.0000 mg | ORAL_CAPSULE | Freq: Two times a day (BID) | ORAL | 0 refills | Status: AC

## 2019-03-18 MED ORDER — METRONIDAZOLE 500 MG PO TABS: 500.0000 mg | ORAL_TABLET | Freq: Four times a day (QID) | ORAL | 0 refills | Status: AC

## 2019-03-18 MED ORDER — FLUCONAZOLE 100 MG PO TABS: 100.0000 mg | ORAL_TABLET | Freq: Every day | ORAL | 2 refills | Status: DC

## 2019-03-18 MED ORDER — BENZONATATE 100 MG PO CAPS: 100.0000 mg | ORAL_CAPSULE | Freq: Three times a day (TID) | ORAL | 0 refills | Status: DC

## 2019-03-18 MED FILL — FLUCONAZOLE 100 MG TABLET: 100 | 30 days supply | Qty: 30 | Fill #0

## 2019-03-18 MED FILL — BENZONATATE 100 MG CAP: 100 | 9 days supply | Qty: 20 | Fill #0

## 2019-03-18 MED FILL — CEFDINIR 300 MG CAPSULE: 300 | 1 days supply | Qty: 2 | Fill #0

## 2019-03-18 MED FILL — metroNIDAZOLE 500 MG TABS: 500 | 2 days supply | Qty: 6 | Fill #0

## 2019-03-18 NOTE — Progress Notes (Signed)
Subjective: Patient was seen on rounds this morning and doing well. He will likely be discharged today pening HIV medications from ID.  Objective:  Vital signs in last 24 hours: Vitals:   03/17/19 1203 03/17/19 1709 03/17/19 1943 03/18/19 0009  BP: _0 99/64  Pulse: 73 62 61 64  Resp: _1 Temp: 98.2 F (36.8 C) 98 F (36.7 C) 98 F (36.7 C) 98.6 F (37 C)  TempSrc: Oral Oral Oral Oral  SpO2: 96% 99% 97% 95%  Weight:      Height:       Physical Exam  Constitutional: He is oriented to person, place, and time and well-developed, well-nourished, and in no distress.  Eyes: EOM are normal.  Neck: Normal range of motion.  Cardiovascular: Normal rate, regular rhythm, normal heart sounds and intact distal pulses. Exam reveals no gallop and no friction rub.  No murmur heard. Pulmonary/Chest: Effort normal. No respiratory distress. He has rales (right sided).  Abdominal: Soft. There is no abdominal tenderness.  Musculoskeletal: Normal range of motion.        General: No tenderness.  Neurological: He is alert and oriented to person, place, and time.  Skin: Skin is warm and dry.     Anti-microbials: (Day 5 of AB) 1. Aztreonam 2g IV (10/04) 2. Vancomycin 1,268m IV (10/04) 3. Azithromycin 5036mpo qd (10/04) 4. Ceftriaxone 2g IV (10/04 - Continued) 5. Metronidazole 50071m6h (10/05 - Continued) 6. Bactrim ppx 800-160 mg po qd (10/04 - Continued) 7. Fluconazole 400m42m (10/04) 8. Fluconazole 200mg11mqd (10/05 - Continued) 9. Biktarvy 50-200-25mg 55md (10/05 - Continued)  Cultures: 1. AFB culture: negative   Pertinent labs/Imaging: CBC Latest Ref Rng & Units 03/17/2019 03/16/2019 03/15/2019  WBC 4.0 - 10.5 K/uL 4.6 4.5 6.0  Hemoglobin 13.0 - 17.0 g/dL 12.4(L) 12.0(L) 12.2(L)  Hematocrit 39.0 - 52.0 % 37.2(L) 35.1(L) 37.0(L)  Platelets 150 - 400 K/uL 430(H) 365 365     Patient Summary: Jon Love is a 83 yea30old male with a pertinent past  medical history of HIV ( previousCD4 count <35and viral load 367,000 on 07/01)GERD,anemia, EtOH use,hx.of oral thrush andpresented with a 3-week history of cough productive of yellow sputum with associated shortness of breath in the setting of in ability to take antiretroviral for the past 15 days is concerning opportunistic pneumonia.[facility] due to MAC.Si[place]live [place] dose of aztreonam and vancomycin were given in the ED.  10/05:Infectious disease was consulted at this time.ID recommended continuing ceftriaxone and starting flagyl for multifocal pneumonia.Start Biktarvy 50 mg/400 mg / 25 mg daily. ID ordered a noncontrast CT to evaluate cavitary lesion.  10/06:AFB cultures pending. Blood cultures pending. CT chest shows likely persistent or recurrent bronchopneumoniaversus chronic aspiration. Slightly improved cavitary lesion in the right upper lobe and superior segment of right lower lobe. Progressive appearing right hilar and subcarnialadenopathy.  10/07: Patient doing well. Cough has subsided. Patient had a small increase in lactic acid today at 2.1. Received bolus IV fluids over night. Gave another Liter of NS and we will recheck the lactic acid today. Waiting on AFB cultures. 1 of 4 AF cultures drawn today.  10/08: Patient doing well. Waiting on AFB culture. Patient set up to receive HIV medications prior to discharge.  10/09: AFB cultures negative. Patient is doing well . No new complaints. Waiting on 30 day supply of HIV medications and ppx. Air boarne precautions have ben removed.  Assessment/Plan:  Active Problems:   Thrush   AIDS (acquired immune deficiency syndrome) ([place])   Multifocal pneumonia   Immigrant with language difficulty   Cachexia ([place])  #Multifocal pneumonia:  -  Patient continues to improve.  - Has finished 5 days worth of antibiotics and will likely need two additional days.  - Cefdinir and Flagyl.  #HIV: Patient tolerating HIV medications. - Patient will be prescribed a 30 day supply of HIV medications take home with him.  - He will be given 30 day supply of HIV ppx medications per ID  #Oral Candidiasis: - Prescribe ppx for patient.   Diet:Normal TMY:TRZN BVA:POLIDCVUDT 40 mg Code:Full  Dispo: Anticipated dischargetoday.  Marianna Payment, MD 03/18/2019, 5:42 AM Pager: [phone]

## 2019-03-18 NOTE — Progress Notes (Signed)
[place], MD called about weaning patient off oxyge.  Patient was curerntly on 1L [place].  Patient removed from [place] and maintaining saturation of 95-96% on room air at rest.

## 2019-03-18 NOTE — Consult Note (Signed)
Subjective: Patient seen at the bedside on rounds this morning. Interaction was aided by [place] interpreter. The patient feels well today. Continues to have a dry cough. We discussed that Karn Pickler would come by later to bring his medications and help him schedule follow up at the ID clinic. Also told him our pharmacist would be by later to talk about taking his medications. Patient is in agreement. All questions were answered.  Antibiotics:  Anti-infectives (From admission, onward)   Start     Dose/Rate Route Frequency Ordered Stop   03/19/19 0000  cefdinir (OMNICEF) 300 MG capsule     300 mg Oral 2 times daily 03/18/19 0842 03/20/19 2359   03/14/19 1430  metroNIDAZOLE (FLAGYL) tablet 500 mg     500 mg Oral Every 6 hours 03/14/19 1429     03/14/19 1430  bictegravir-emtricitabine-tenofovir AF (BIKTARVY) 50-200-25 MG per tablet 1 tablet     1 tablet Oral Daily 03/14/19 1429     03/14/19 1000  fluconazole (DIFLUCAN) tablet 200 mg     200 mg Oral Daily 03/13/19 1932 03/28/19 0959   03/13/19 2145  sulfamethoxazole-trimethoprim (BACTRIM DS) 800-160 MG per tablet 1 tablet     1 tablet Oral Daily 03/13/19 2138     03/13/19 2100  cefTRIAXone (ROCEPHIN) 2 g in sodium chloride 0.9 % 100 mL IVPB     2 g 200 mL/hr over 30 Minutes Intravenous Every 24 hours 03/13/19 1932     03/13/19 2030  fluconazole (DIFLUCAN) tablet 400 mg     400 mg Oral  Once 03/13/19 1932 03/13/19 2058   03/13/19 2030  azithromycin (ZITHROMAX) tablet 500 mg  Status:  Discontinued     500 mg Oral Daily 03/13/19 1932 03/14/19 1429   03/13/19 1400  vancomycin (VANCOCIN) 1,250 mg in sodium chloride 0.9 % 250 mL IVPB     1,250 mg 166.7 mL/hr over 90 Minutes Intravenous  Once 03/13/19 1319 03/13/19 1630   03/13/19 1330  aztreonam (AZACTAM) 2 g in sodium chloride 0.9 % 100 mL IVPB     2 g 200 mL/hr over 30 Minutes Intravenous  Once 03/13/19 1317 03/13/19 1427     Medications: Scheduled Meds: . benzonatate  100 mg Oral  TID  . bictegravir-emtricitabine-tenofovir AF  1 tablet Oral Daily  . enoxaparin (LOVENOX) injection  40 mg Subcutaneous Q24H  . feeding supplement (ENSURE ENLIVE)  237 mL Oral BID BM  . fluconazole  200 mg Oral Daily  . metroNIDAZOLE  500 mg Oral Q6H  . sulfamethoxazole-trimethoprim  1 tablet Oral Daily   Continuous Infusions: . sodium chloride 1,000 mL (03/17/19 2107)  . cefTRIAXone (ROCEPHIN)  IV 2 g (03/17/19 2108)   PRN Meds:.sodium chloride, acetaminophen **OR** acetaminophen  Objective: Weight change:   Intake/Output Summary (Last 24 hours) at 03/18/2019 1203 Last data filed at 03/18/2019 1125 Gross per 24 hour  Intake 920 ml  Output -  Net 920 ml   Blood pressure 94/64, pulse 68, temperature 97.8 F (36.6 C), temperature source Oral, resp. rate 17, height 5' 10.87" (1.8 m), weight 56.9 kg, SpO2 96 %. Temp:  [97.8 F (36.6 C)-98.6 F (37 C)] 97.8 F (36.6 C) (10/09 1126) Pulse Rate:  [61-81] 68 (10/09 1126) Resp:  [16-17] 17 (10/09 1126) BP: (90-99)/(59-67) 94/64 (10/09 1126) SpO2:  [94 %-99 %] 96 % (10/09 1126) Weight:  [56.9 kg] 56.9 kg (10/09 0500)  Physical Exam: General: NAD, resting in bed comfortably  HEENT: NCAT, EOMI  CV: RRR, normal S1 & S2, no murmurs rubs or gallops PULM: CTAB Extremities: No edema or deformity noted bilaterally Neuro: Alert and oriented, no focal deficits  CBC:    Component Value Date/Time   WBC 5.2 03/18/2019 0546   RBC 5.40 03/18/2019 0546   HGB 13.6 03/18/2019 0546   HCT 41.4 03/18/2019 0546   PLT 483 (H) 03/18/2019 0546   MCV 76.7 (L) 03/18/2019 0546   MCV 79.9 (A) 09/17/2014 1105   MCH 25.2 (L) 03/18/2019 0546   MCHC 32.9 03/18/2019 0546   RDW 14.1 03/18/2019 0546   LYMPHSABS 2.0 03/14/2019 0450   MONOABS 0.8 03/14/2019 0450   EOSABS 0.3 03/14/2019 0450   BASOSABS 0.0 03/14/2019 0450   BMET Recent Labs    03/16/19 0338  NA 134*  K 4.3  CL 100  CO2 25  GLUCOSE 105*  BUN 6  CREATININE 0.80  CALCIUM 8.3*    Liver Panel No results for input(s): PROT, ALBUMIN, AST, ALT, ALKPHOS, BILITOT, BILIDIR, IBILI in the last 72 hours. Micro Results: Recent Results (from the past 720 hour(s))  Blood culture (routine x 2)     Status: None   Collection Time: 03/13/19  1:00 PM   Specimen: BLOOD  Result Value Ref Range Status   Specimen Description BLOOD SITE NOT SPECIFIED  Final   Special Requests   Final    BOTTLES DRAWN AEROBIC ONLY Blood Culture results may not be optimal due to an inadequate volume of blood received in culture bottles   Culture   Final    NO GROWTH 5 DAYS Performed at [facility], [place] [address], [place], [place] [address]    Report Status 03/18/2019 FINAL  Final  Blood culture (routine x 2)     Status: None   Collection Time: 03/13/19  1:00 PM   Specimen: BLOOD  Result Value Ref Range Status   Specimen Description BLOOD SITE NOT SPECIFIED  Final   Special Requests   Final    BOTTLES DRAWN AEROBIC ONLY Blood Culture results may not be optimal due to an inadequate volume of blood received in culture bottles   Culture   Final    NO GROWTH 5 DAYS Performed at [facility], [place] [address] [place], [place] [address]    Report Status 03/18/2019 FINAL  Final  SARS Coronavirus 2 [facility] order, Performed in [facility] hospital lab) Nasopharyngeal Nasopharyngeal Swab     Status: None   Collection Time: 03/13/19  3:37 PM   Specimen: Nasopharyngeal Swab  Result Value Ref Range Status   SARS Coronavirus 2 NEGATIVE NEGATIVE Final    Comment: (NOTE) If result is NEGATIVE SARS-CoV-2 target nucleic acids are NOT DETECTED. The SARS-CoV-2 RNA is generally detectable in upper and lower  respiratory specimens during the acute phase of infection. The lowest  concentration of SARS-CoV-2 viral copies this assay can detect is 250  copies / mL. A negative result does not preclude SARS-CoV-2 infection  and should not be used as the sole basis for treatment or other  patient  management decisions.  A negative result may occur with  improper specimen collection / handling, submission of specimen other  than nasopharyngeal swab, presence of viral mutation(s) within the  areas targeted by this assay, and inadequate number of viral copies  (<250 copies / mL). A negative result must be combined with clinical  observations, patient history, and epidemiological information. If result is POSITIVE SARS-CoV-2 target nucleic acids are DETECTED. The  SARS-CoV-2 RNA is generally detectable in upper and lower  respiratory specimens dur ing the acute phase of infection.  Positive  results are indicative of active infection with SARS-CoV-2.  Clinical  correlation with patient history and other diagnostic information is  necessary to determine patient infection status.  Positive results do  not rule out bacterial infection or co-infection with other viruses. If result is PRESUMPTIVE POSTIVE SARS-CoV-2 nucleic acids MAY BE PRESENT.   A presumptive positive result was obtained on the submitted specimen  and confirmed on repeat testing.  While 2019 novel coronavirus  (SARS-CoV-2) nucleic acids may be present in the submitted sample  additional confirmatory testing may be necessary for epidemiological  and / or clinical management purposes  to differentiate between  SARS-CoV-2 and other Sarbecovirus currently known to infect humans.  If clinically indicated additional testing with an alternate test  methodology [phone]) is advised. The SARS-CoV-2 RNA is generally  detectable in upper and lower respiratory sp ecimens during the acute  phase of infection. The expected result is Negative. Fact Sheet for Patients:  StrictlyIdeas.no Fact Sheet for Healthcare Providers: BankingDealers.co.za This test is not yet approved or cleared by the [place] FDA and has been authorized for detection and/or diagnosis of SARS-CoV-2 by FDA under  an Emergency Use Authorization (EUA).  This EUA will remain in effect (meaning this test can be used) for the duration of the COVID-19 declaration under Section 564(b)(1) of the Act, 21 U.S.C. section 360bbb-3(b)(1), unless the authorization is terminated or revoked sooner. Performed at [facility], [place] [address], [place], [place] [address]   Acid Fast Smear (AFB)     Status: None   Collection Time: 03/16/19  6:38 AM   Specimen: Sputum  Result Value Ref Range Status   AFB Specimen Processing Concentration  Final   Acid Fast Smear Negative  Final    Comment: (NOTE) Performed At: [facility] [place], [place] [id number] Rush Farmer MD HC:[id number]    Source (AFB) SPUTUM  Final    Comment: Performed at [facility], [place] [address] [place], [place] [address]   Studies/Results: No results found. 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 CXR findings consistent with multifocal pneumonia. Likely aspiration given poor dentition and oral thrush. Acid fast sputum smears are negative.   1. Continue Ceftriaxone 2g q24 hrs, Flagyl 500 mg TID. Upon discharge, give Cefdinir 300 mg daily + Flagyl 518m TID to complete a 7 day course of abx (stop date 10/11). 2. Continue Biktarvy 50 mg/200 mg/25 mg daily 3. MStarr Laketo deliver HIV medications to the patient at the bedside. Pt will also discuss medication regimen moving forward with the pharmacist. 4. Follow up appointment with Dr. VTommy Medalon 10/21    LOS: 5 days   AEarlene Plater MD Internal Medicine, PGY1 Pager: [phone]  03/18/2019,12:19 PM

## 2019-03-18 NOTE — Discharge Summary (Signed)
Name: Jon Love MRN: [id number] DOB: [birth year] 43 y.o. PCP: Patient, No Pcp Per  Date of Admission: 03/13/2019 11:29 AM Date of Discharge: 03/18/2019 Attending Physician: No att. providers found  Discharge Diagnosis: 1. Multifocal pneumonia  Discharge Medications: Allergies as of 03/18/2019      Reactions   Penicillins Hives, Itching   Has patient had a PCN reaction causing immediate rash, facial/tongue/throat swelling, SOB or lightheadedness with hypotension: Yes Has patient had a PCN reaction causing severe rash involving mucus membranes or skin necrosis: No Has patient had a PCN reaction that required hospitalization No Has patient had a PCN reaction occurring within the last 10 years: No If all of the above answers are "NO", then may proceed with Cephalosporin use.      Medication List    STOP taking these medications   sulfamethoxazole-trimethoprim 400-80 MG tablet Commonly known as: BACTRIM     TAKE these medications   benzonatate 100 MG capsule Commonly known as: TESSALON Take 1 capsule (100 mg total) by mouth 3 (three) times daily.   fluconazole 100 MG tablet Commonly known as: Diflucan Take 1 tablet (100 mg total) by mouth daily.     ASK your doctor about these medications   cefdinir 300 MG capsule Commonly known as: OMNICEF Take 1 capsule (300 mg total) by mouth 2 (two) times daily for 3 doses. Today (10/09): Take one 300 mg tablet this evening (10/09)  Tomorrow (10/10): take one 300 mg in the morning and another 300 mg table at night. Ask about: Should I take this medication?   cefdinir 300 MG capsule Commonly known as: OMNICEF Take 1 capsule (300 mg total) by mouth 2 (two) times daily for 1 day. Ask about: Should I take this medication?   metroNIDAZOLE 500 MG tablet Commonly known as: FLAGYL Take 1 tablet (500 mg total) by mouth every 6 (six) hours for 6 doses. Take one table every 6 hours. Ask about: Should I take this medication?        Disposition and follow-up:   Mr.Jon Love was discharged from [facility] in Good condition.  At the hospital follow up visit please address:  1.  Patients follow up appointments were set up by Infectious Disease and [place]. Patient has an appointment with Dr. Tommy Medal on 10/21.   2.  Labs / imaging needed at time of follow-up: none  3.  Pending labs/ test needing follow-up: none  Follow-up Appointments: Follow-up Information    [facility] Go on 04/15/2019.   Why: _0 :30pm Contact information: [place] [address] [phone]          Hospital Course by problem list: 1. Multifocal pneumonia  Presented to [name] with a 3 week history of productive cough and dyspnea concerning for MAC recurrence. Patient was discharged in June 2020 for a cavitary pneumonia with one AFB culture positive for MAC. Presumed to be a colonization. Patient was discharged but was lost to follow up. During this admission, patients clinical picture was more consistent with aspiration pneumonia due to poor dentition. Clinical presentation was correlated with CXR findings and AFB smear was negative for MAC. Patient was treated with a 5 day course of ceftriaxone and Metronidazole and discharged with a 2 day course of cefdinir and metronidazole. Patient clinically improved with decreased cough, normal SPO2 on RA, and resolution of his chills.   2. AIDS Patients HIV/AIDs has been poorly controlled with  most recent CD4 count < 35 and viral load of 6350 (10/04). During this hospital course, the patient was started on Biktarvy 50 mg/200 mg/25 mg daily for HIV, fluconazole and Bactrim ppx. He was given a 30 day supply of these medications and set up with a follow up appointment with Dr. Tommy Medal on 10/21.   3. Thrush Patient presented with oral thrush, treated with oral fluconazole. Give 30 day supply for ppx.     Discharge Vitals:   BP 95/68 (BP Location: Left Arm)   Pulse 73   Temp 98.5 F (36.9 C) (Oral)   Resp 17   Ht 5' 10.87" (1.8 m)   Wt 56.9 kg   SpO2 95%   BMI 17.57 kg/m   Pertinent Labs, Studies, and Procedures:  CBC Latest Ref Rng & Units 03/18/2019 03/17/2019 03/16/2019  WBC 4.0 - 10.5 K/uL 5.2 4.6 4.5  Hemoglobin 13.0 - 17.0 g/dL 13.6 12.4(L) 12.0(L)  Hematocrit 39.0 - 52.0 % 41.4 37.2(L) 35.1(L)  Platelets 150 - 400 K/uL 483(H) 430(H) 365   CMP Latest Ref Rng & Units 03/16/2019 03/14/2019 03/13/2019  Glucose 70 - 99 mg/dL 105(H) 99 155(H)  BUN 6 - 20 mg/dL _0 Creatinine 0.61 - 1.24 mg/dL 0.80 0.80 0.86  Sodium 135 - 145 mmol/L 134(L) 131(L) 130(L)  Potassium 3.5 - 5.1 mmol/L 4.3 4.1 4.6  Chloride 98 - 111 mmol/L 100 100 95(L)  CO2 22 - 32 mmol/L 25 21(L) 23  Calcium 8.9 - 10.3 mg/dL 8.3(L) 8.5(L) 8.9  Total Protein 6.5 - 8.1 g/dL - 5.5(L) -  Total Bilirubin 0.3 - 1.2 mg/dL - 0.7 -  Alkaline Phos 38 - 126 U/L - 75 -  AST 15 - 41 U/L - 17 -  ALT 0 - 44 U/L - 19 -   AFB - negative  CXR - Worsening right base airspace process and minimal left base opacification compatible with multifocal pneumonia. CT Chest: 1. Persistent fairly extensive bronchial and peribronchial disease in the right lower lobe and to a lesser extent in the right middle lobe and left lower lobe. This is likely persistent or recurrent bronchopneumonia. Chronic aspiration is another possibility. 2. Slightly improved cavitary lesions in the right upper lobe and superior segment of the right lower lobe. 3. Progressive appearing right hilar and subcarinal adenopathy.  Discharge Instructions: Discharge Instructions    Diet - low sodium heart healthy   Complete by: As directed    Discharge instructions   Complete by: As directed    You were hospitalized for pneumonia. Thank you for allowing [place] to be part of your care.   Please follow up with Infection Disease as an out patient.    Please note  these changes made to your medications:   Please START taking:  1. Cefdinir   - take one  300 mg pill tonight (10/09), Tomorrow (10/10) take one 300 mg pill in the morning and another 300 mg in the evening for a total of 600 mg.  2. Metronidazole  - Today (10/09) take two 500 mg tablets this evening. Tomorrow (10/10) take one 500 mg tablet every 6 hours for a total of 4 tablets tomorrow.  3. Fluconazole - take one 100 mg tablet daily.  4. Continue HIV medications given to you by infectious disease       Please make sure to follow up with your out patient infectious disease doctor. Please call us with any questions.  Please call our clinic if you  have any questions or concerns, we may be able to help and keep you from a long and expensive emergency room wait. Our clinic and after hours phone number is [phone], the best time to call is Monday through Friday 9 am to 4 pm but there is always someone available 24/7 if you have an emergency. If you need medication refills please notify your pharmacy one week in advance and they will send [place] a request.   Increase activity slowly   Complete by: As directed       Signed: Marianna Payment, MD 03/21/2019, 11:08 AM   Pager: [phone]

## 2019-03-18 NOTE — Progress Notes (Signed)
Patient waiting on transition of care pharmacy before he can get discharged.

## 2019-03-19 NOTE — Progress Notes (Signed)
Patient has been given discharges instructions, all questions and concerns answered and addressed to patient's satisfaction. The translator Ipad was used to help patient have a better understanding. Patient has all his medications from transition of care.

## 2019-03-21 NOTE — Telephone Encounter (Signed)
Cefdinir is not on [employer] formulary.  Do you need something else for his pneumonia? Landis Gandy, RN

## 2019-03-21 NOTE — Telephone Encounter (Signed)
We provided it to him through the transition of care pharmacy in the hospital

## 2019-03-29 ENCOUNTER — Inpatient Hospital Stay: Payer: Self-pay | Admitting: Internal Medicine

## 2019-03-29 ENCOUNTER — Telehealth: Payer: Self-pay

## 2019-03-29 NOTE — Telephone Encounter (Signed)
COVID-19 Pre-Screening Questions:03/29/19   Do you currently have a fever (>100 F), chills or unexplained body aches? NO  . Are you currently experiencing new cough, shortness of breath, sore throat, runny nose? NO .  Have you recently travelled outside the state of [place] in the last 14 days? NO .  Have you been in contact with someone that is currently pending confirmation of Covid19 testing or has been confirmed to have the Covid19 virus?  NO  **If the patient answers NO to ALL questions -  advise the patient to please call the clinic before coming to the office should any symptoms develop.     

## 2019-03-30 ENCOUNTER — Ambulatory Visit (INDEPENDENT_AMBULATORY_CARE_PROVIDER_SITE_OTHER): Payer: Self-pay | Admitting: Infectious Disease

## 2019-03-30 ENCOUNTER — Other Ambulatory Visit: Payer: Self-pay

## 2019-03-30 ENCOUNTER — Encounter: Payer: Self-pay | Admitting: Infectious Disease

## 2019-03-30 VITALS — BP 113/78 | Temp 97.9°F | Wt 137.4 lb

## 2019-03-30 DIAGNOSIS — B3781 Candidal esophagitis: Secondary | ICD-10-CM

## 2019-03-30 DIAGNOSIS — B20 Human immunodeficiency virus [HIV] disease: Secondary | ICD-10-CM

## 2019-03-30 DIAGNOSIS — B370 Candidal stomatitis: Secondary | ICD-10-CM

## 2019-03-30 DIAGNOSIS — R64 Cachexia: Secondary | ICD-10-CM

## 2019-03-30 DIAGNOSIS — J189 Pneumonia, unspecified organism: Secondary | ICD-10-CM

## 2019-03-30 DIAGNOSIS — J984 Other disorders of lung: Secondary | ICD-10-CM

## 2019-03-30 MED ORDER — LEVOFLOXACIN 500 MG PO TABS: 500.0000 mg | ORAL_TABLET | Freq: Every day | ORAL | 1 refills | Status: DC

## 2019-03-30 NOTE — Progress Notes (Signed)
Subjective:   Chief complaint return of pain along his ribs posteriorly   Patient ID: [facility], male    DOB: 1975-07-08, 43 y.o.   MRN: [id number]  HPI  Jon Love is a 43 year old Hispanic man living with HIV and AIDS has been very poorly controlled due to difficulty with adherence, potential cognitive problems and difficulty getting time away from his work which often takes him also out of state.  I recently saw him in the hospital when he was being treated for cavitary pneumonia.  He had 1 sputum sent for AFB smear and culture which was negative.  He improved on antibacterial therapy.  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  His chest CT on admission this time was consistent with cavitary pneumonia which I suspect is due to him aspirating bacteria from his oropharynx.  He also had pretty fair fairly bad case of thrush which we treated with fluconazole.  We did procure him his Biktarvy, along with fluconazole and Bactrim for PCP prevention.  We gave him cefdinir through more hospital supply along with metronidazole and he completed a course.  Unfortunately the interim he has had worsening of his low back pain and does have persistent cough making me concerned that we need to treat his cavitary pneumonia for more protracted course.  He was brought to clinic today by [facility] and he did bring all of his medications in a bag with him.  The visit was conducted through Spanish translation in person   Review of Systems  Constitutional: Negative for chills and fever.  HENT: Negative for congestion and sore throat.   Eyes: Negative for photophobia.  Respiratory: Positive for cough. Negative for shortness of breath and wheezing.   Cardiovascular: Negative for chest pain, palpitations and leg swelling.  Gastrointestinal: Negative for abdominal pain,  blood in stool, constipation, diarrhea, nausea and vomiting.  Genitourinary: Negative for dysuria, flank pain and hematuria.  Musculoskeletal: Negative for back pain and myalgias.  Skin: Negative for rash.  Neurological: Negative for dizziness, weakness and headaches.  Hematological: Does not bruise/bleed easily.  Psychiatric/Behavioral: Negative for suicidal ideas.       Objective:   Physical Exam Constitutional:      Appearance: He is ill-appearing.  HENT:     Head: Normocephalic and atraumatic.     Nose: Nose normal.     Mouth/Throat:     Mouth: Mucous membranes are dry.     Pharynx: Oropharynx is clear. No oropharyngeal exudate or posterior oropharyngeal erythema.  Eyes:     Extraocular Movements: Extraocular movements intact.     Conjunctiva/sclera: Conjunctivae normal.  Cardiovascular:     Rate and Rhythm: Normal rate and regular rhythm.     Heart sounds: No murmur. No friction rub. No gallop.   Pulmonary:     Effort: Pulmonary effort is normal. No respiratory distress.     Breath sounds: Normal breath sounds. No stridor. No wheezing or rhonchi.  Abdominal:     General: There is no distension.  Skin:    Coloration: Skin is pale. Skin is not jaundiced.  Neurological:     General: No focal deficit present.     Mental Status: He is alert and oriented to person, place, and time.  Psychiatric:        Mood and Affect: Mood normal.  Behavior: Behavior normal.        Thought Content: Thought content normal.           Assessment & Plan:   AIDS and HIV disease: We will check labs today and hopefully the Jon Love has made a difference already his viral load was not astronomically high so it could have already suppressed.  We will continue Bactrim for PCP prevention  Cavitary pneumonia I am going to put him on Levaquin 500 mg for a month would have preferred a cephalosporin in combination with metronidazole but cephalosporins other than Keflex and not on the HIV  medication assistance program.  Thrush: He may indeed need reinstitution of his fluconazole in the near future as I suspect his thrush will worsen in the context of Levaquin and Bactrim.  Cachexia: Hopefully his weight will improve with suppression of his virus I am not enthusiastic with the idea of megestrol.  We could contemplate giving him nutritional supplements but I want worry about chelation of his bictegravir if not properly separated out.  Cognitive impairment: Going to fill an entire month's worth of medications for him and have him see pharmacy in 2 weeks time in the in 1 month's time.  He is going give his boss the dates that he can miss them work and Jon Love will bring him to clinic  We  spent greater than 40 minutes with the patient including greater than 50% of time in face to face counsel of the patient the nature of his HIV how he had to take his medications, how to treat his cavitary pneumonia I would get him back and forth from clinic and in coordination of his care

## 2019-03-30 NOTE — Patient Instructions (Signed)
Jon Love is to ALSO make APPT in 2 weeks that he can give as date to his boss in addition to my appt

## 2019-03-31 LAB — T-HELPER CELL (CD4) - (RCID CLINIC ONLY)
CD4 % Helper T Cell: 2 % — ABNORMAL LOW (ref 33–65)
CD4 T Cell Abs: 53 /uL — ABNORMAL LOW (ref 400–1790)

## 2019-04-05 LAB — URINE CYTOLOGY ANCILLARY ONLY
Chlamydia: NEGATIVE
Comment: NEGATIVE
Comment: NORMAL
Neisseria Gonorrhea: NEGATIVE

## 2019-04-06 LAB — COMPLETE METABOLIC PANEL WITH GFR
AG Ratio: 1.5 (calc) (ref 1.0–2.5)
ALT: 34 U/L (ref 9–46)
AST: 24 U/L (ref 10–40)
Albumin: 3.9 g/dL (ref 3.6–5.1)
Alkaline phosphatase (APISO): 61 U/L (ref 36–130)
BUN: 10 mg/dL (ref 7–25)
CO2: 29 mmol/L (ref 20–32)
Calcium: 9.3 mg/dL (ref 8.6–10.3)
Chloride: 104 mmol/L (ref 98–110)
Creat: 0.8 mg/dL (ref 0.60–1.35)
GFR, Est African American: 127 mL/min/{1.73_m2} (ref >=60)
GFR, Est Non African American: 109 mL/min/{1.73_m2} (ref >=60)
Globulin: 2.6 g/dL (calc) (ref 1.9–3.7)
Glucose, Bld: 94 mg/dL (ref 65–99)
Potassium: 4.6 mmol/L (ref 3.5–5.3)
Sodium: 140 mmol/L (ref 135–146)
Total Bilirubin: 0.4 mg/dL (ref 0.2–1.2)
Total Protein: 6.5 g/dL (ref 6.1–8.1)

## 2019-04-06 LAB — HIV RNA, RTPCR W/R GT (RTI, PI,INT)
HIV 1 RNA Quant: 153 copies/mL — ABNORMAL HIGH
HIV-1 RNA Quant, Log: 2.18 Log copies/mL — ABNORMAL HIGH

## 2019-04-06 LAB — CBC WITH DIFFERENTIAL/PLATELET
Absolute Monocytes: 523 cells/uL (ref 200–950)
Basophils Absolute: 117 cells/uL (ref 0–200)
Basophils Relative: 1.5 %
Eosinophils Absolute: 1864 cells/uL — ABNORMAL HIGH (ref 15–500)
Eosinophils Relative: 23.9 %
HCT: 41.5 % (ref 38.5–50.0)
Hemoglobin: 13.3 g/dL (ref 13.2–17.1)
Lymphs Abs: 3658 cells/uL (ref 850–3900)
MCH: 25.4 pg — ABNORMAL LOW (ref 27.0–33.0)
MCHC: 32 g/dL (ref 32.0–36.0)
MCV: 79.3 fL — ABNORMAL LOW (ref 80.0–100.0)
MPV: 11.3 fL (ref 7.5–12.5)
Monocytes Relative: 6.7 %
Neutro Abs: 1638 cells/uL (ref 1500–7800)
Neutrophils Relative %: 21 %
Platelets: 264 10*3/uL (ref 140–400)
RBC: 5.23 10*6/uL (ref 4.20–5.80)
RDW: 16.1 % — ABNORMAL HIGH (ref 11.0–15.0)
Total Lymphocyte: 46.9 %
WBC: 7.8 10*3/uL (ref 3.8–10.8)

## 2019-04-11 ENCOUNTER — Ambulatory Visit (INDEPENDENT_AMBULATORY_CARE_PROVIDER_SITE_OTHER): Payer: Self-pay | Admitting: Pharmacist

## 2019-04-11 ENCOUNTER — Other Ambulatory Visit: Payer: Self-pay

## 2019-04-11 DIAGNOSIS — B20 Human immunodeficiency virus [HIV] disease: Secondary | ICD-10-CM

## 2019-04-11 DIAGNOSIS — J189 Pneumonia, unspecified organism: Secondary | ICD-10-CM

## 2019-04-11 DIAGNOSIS — B370 Candidal stomatitis: Secondary | ICD-10-CM

## 2019-04-11 DIAGNOSIS — J984 Other disorders of lung: Secondary | ICD-10-CM

## 2019-04-11 DIAGNOSIS — R05 Cough: Secondary | ICD-10-CM

## 2019-04-11 DIAGNOSIS — R059 Cough, unspecified: Secondary | ICD-10-CM

## 2019-04-11 MED ORDER — BENZONATATE 100 MG PO CAPS: 100.0000 mg | ORAL_CAPSULE | Freq: Two times a day (BID) | ORAL | 2 refills | Status: DC | PRN

## 2019-04-11 NOTE — Progress Notes (Signed)
HPI: [facility] Jon Love is a 43 y.o. male who presents to the [facility] pharmacy clinic for HIV follow-up.  Patient Active Problem List   Diagnosis Date Noted  . Immigrant with language difficulty   . Cachexia (HCC)   . Poor dentition   . Multifocal pneumonia 03/13/2019  . HIV infection (HCC) 12/08/2018  . Microcytic anemia 12/08/2018  . Cavitary pneumonia 12/05/2018  . Alcohol use 12/05/2018  . Thrush of mouth and esophagus (HCC) 03/21/2016  . Anemia 08/16/2015  . AIDS (acquired immune deficiency syndrome) (HCC)   . Thrush   . Gastroesophageal reflux disease without esophagitis   . Streptococcal pneumonia (HCC) 03/17/2015  . Gout 10/12/2014  . Cholelithiasis 10/07/2014  . Protein-calorie malnutrition, severe (HCC) 10/07/2014  . 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 Choledocholithiasis   . Abnormal transaminases 10/06/2014    Patient's Medications  New Prescriptions   No medications on file  Previous Medications   BIKTARVY 50-200-25 MG TABS TABLET    TK 1 T PO D   FLUCONAZOLE (DIFLUCAN) 100 MG TABLET    Take 1 tablet (100 mg total) by mouth daily.   LEVOFLOXACIN (LEVAQUIN) 500 MG TABLET    Take 1 tablet (500 mg total) by mouth daily.  Modified Medications   Modified Medication Previous Medication   BENZONATATE (TESSALON) 100 MG CAPSULE benzonatate (TESSALON) 100 MG capsule      Take 1 capsule (100 mg total) by mouth 2 (two) times daily as needed for cough.    Take 1 capsule (100 mg total) by mouth 3 (three) times daily.  Discontinued Medications   No medications on file    Allergies: Allergies  Allergen Reactions  . Penicillins Hives and Itching    Has patient had a PCN reaction causing immediate rash, facial/tongue/throat swelling, SOB or lightheadedness with hypotension: Yes Has patient had a PCN reaction causing severe rash involving mucus membranes or skin necrosis: No Has patient had a PCN reaction that required hospitalization No Has patient had a PCN  reaction occurring within the last 10 years: No If all of the above answers are "NO", then may proceed with Cephalosporin use.    Past Medical History: Past Medical History:  Diagnosis Date  . HIV disease (HCC)   . Known health problems: none     Social History: Social History   Socioeconomic History  . Marital status: Single    Spouse name: Not on file  . Number of children: Not on file  . Years of education: Not on file  . Highest education level: Not on file  Occupational History  . Not on file  Social Needs  . Financial resource strain: Not on file  . Food insecurity    Worry: Not on file    Inability: Not on file  . Transportation needs    Medical: Not on file    Non-medical: Not on file  Tobacco Use  . Smoking status: Never Smoker  . Smokeless tobacco: Never Used  Substance and Sexual Activity  . Alcohol use: Yes    Alcohol/week: 1.0 standard drinks    Types: 1 Cans of beer per week    Comment: socially  . Drug use: Not Currently  . Sexual activity: Not Currently    Partners: Female    Comment: offered condoms 03/2019  Lifestyle  . Physical activity    Days per week: Not on file    Minutes per session: Not on file  . Stress: Not on file  Relationships  .  Social Musicianconnections    Talks on phone: Not on file    Gets together: Not on file    Attends religious service: Not on file    Active member of club or organization: Not on file    Attends meetings of clubs or organizations: Not on file    Relationship status: Not on file  Other Topics Concern  . Not on file  Social History Narrative   ** Merged History Encounter **        Labs: Lab Results  Component Value Date   HIV1RNAQUANT 153 (H) 03/30/2019   HIV1RNAQUANT 6,350 03/13/2019   HIV1RNAQUANT 705,000 (H) 06/24/2017   CD4TABS 53 (L) 03/30/2019   CD4TABS <35 (L) 03/13/2019   CD4TABS <35 (L) 12/08/2018    RPR and STI Lab Results  Component Value Date   LABRPR NON-REACTIVE 06/24/2017    LABRPR Non Reactive 08/14/2015   LABRPR Non Reactive 10/08/2014    STI Results GC CT  03/30/2019 Negative Negative  06/24/2017 Negative Negative    Hepatitis B Lab Results  Component Value Date   HEPBSAG NEGATIVE 10/07/2014   Hepatitis C No results found for: HEPCAB, HCVRNAPCRQN Hepatitis A Lab Results  Component Value Date   HAV Positive (A) 10/12/2014   Lipids: Lab Results  Component Value Date   CHOL 96 06/24/2017   TRIG 122 06/24/2017   HDL 24 (L) 06/24/2017   CHOLHDL 4.0 06/24/2017   LDLCALC 52 06/24/2017    Current HIV Regimen: Biktarvy  Assessment: Jon Love is here today to follow-up for his HIV infection.  I saw him 2 weeks ago and started filling his pill box to help with his adherence.  He is spanish speaking only and has a hard time with reading and writing.  Angie, Spanish interpreter, helped with this encounter.  Jon Love states that he is doing much better today.  He states that he feels better and is taking his medication every day without any missed doses since we saw him last.  I congratulated him on doing so well and encouraged him to continue his progress.  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  His thrush has resolved and he is no longer having issues with that.  He states that his breathing and cough are better but that he still suffers from a cough in the morning. He is asking for more Tessalon pearls, which I will refill for him. He states that he takes one per day and they do help him. He does not take it if he does not need to.  I filled his pill box with Biktarvy, Bactrim, Levaquin, and fluconazole for 5 weeks. He will complete treatment with fluconazole later this week and will finish Levaquin in a few weeks as well.  I will defer to Dr. Daiva EvesVan Dam to decide if he needs more of that.   With the help of Angie, we wrote the first letter of the days of the week in Spanish on his pill box and the  numbers 1-5 to help him know which pillbox to start next.  He was able to put them in order when asked.  He is optimistic about his care and so am I. His labs look so much better than before. Encouraged him to continue doing an excellent job. He will see Dr. Daiva EvesVan Dam in 2 weeks and me again in 4 weeks to refill pill box.  Plan: - Continue Biktarvy  and Bactrim daily - Finish fluconazole and levaquin Rx - F/u with Dr. Tommy Medal 11/16 at 945am  Cassie L. Kuppelweiser, PharmD, BCIDP, AAHIVP, [facility] 04/13/2019, 10:09 AM

## 2019-04-15 ENCOUNTER — Ambulatory Visit: Payer: Self-pay | Attending: Family Medicine | Admitting: Family Medicine

## 2019-04-15 ENCOUNTER — Other Ambulatory Visit: Payer: Self-pay

## 2019-04-25 ENCOUNTER — Ambulatory Visit (INDEPENDENT_AMBULATORY_CARE_PROVIDER_SITE_OTHER): Payer: Self-pay | Admitting: Infectious Disease

## 2019-04-25 ENCOUNTER — Ambulatory Visit (INDEPENDENT_AMBULATORY_CARE_PROVIDER_SITE_OTHER): Payer: Self-pay | Admitting: Pharmacist

## 2019-04-25 ENCOUNTER — Encounter: Payer: Self-pay | Admitting: Infectious Disease

## 2019-04-25 ENCOUNTER — Other Ambulatory Visit: Payer: Self-pay

## 2019-04-25 VITALS — BP 125/86 | HR 89 | Wt 139.0 lb

## 2019-04-25 DIAGNOSIS — J984 Other disorders of lung: Secondary | ICD-10-CM

## 2019-04-25 DIAGNOSIS — Z23 Encounter for immunization: Secondary | ICD-10-CM

## 2019-04-25 DIAGNOSIS — B3781 Candidal esophagitis: Secondary | ICD-10-CM

## 2019-04-25 DIAGNOSIS — B370 Candidal stomatitis: Secondary | ICD-10-CM

## 2019-04-25 DIAGNOSIS — B20 Human immunodeficiency virus [HIV] disease: Secondary | ICD-10-CM

## 2019-04-25 DIAGNOSIS — J189 Pneumonia, unspecified organism: Secondary | ICD-10-CM

## 2019-04-25 DIAGNOSIS — J154 Pneumonia due to other streptococci: Secondary | ICD-10-CM

## 2019-04-25 MED ORDER — BIKTARVY 50-200-25 MG PO TABS: ORAL_TABLET | ORAL | 11 refills | Status: DC

## 2019-04-25 NOTE — Addendum Note (Signed)
Addended by: Reggy Eye on: 04/25/2019 03:37 PM   Modules accepted: Orders

## 2019-04-25 NOTE — Progress Notes (Signed)
Stepped in to see patient with Dr. Tommy Medal. He is doing very well and taking his medications.  He has 3 full weeks left in his pillbox and will see me for a refill on 12/7.

## 2019-04-25 NOTE — Progress Notes (Signed)
Subjective:   Chief complaint pain in his wrists and knees  Patient ID: [facility], male    DOB: June 09, 1976, 43 y.o.   MRN: [id number]  HPI  Jon Love is a 43 year old Hispanic man living with HIV and AIDS has been very poorly controlled due to difficulty with adherence, potential cognitive problems and difficulty getting time away from his work which often takes him also out of state.  I recently saw him in the hospital when he was being treated for cavitary pneumonia.  He had 1 sputum sent for AFB smear and culture which was negative.  He improved on antibacterial therapy.  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  His chest CT on admission this time was consistent with cavitary pneumonia which I suspect is due to him aspirating bacteria from his oropharynx.  He also had pretty fair fairly bad case of thrush which we treated with fluconazole.  We did procure him his Biktarvy, along with fluconazole and Bactrim for PCP prevention.  We gave him cefdinir through more hospital supply along with metronidazole and he completed a course.  At his last visit he had return of all of his pain along his shoulder that is associated with his pneumonia and also worsening cough.  We therefore gave him a prescription for levofloxacin.  He met with Cassie who filled his pillboxes with occasions.  Labs that shown his viral load, to control and his CD4 had risen from undetectable levels to 53.  He comes today again to clinic accompanied by [facility].  He made his appoint with Cassie last month.  Seems as long as we have letters telling his boss when he is coming to the appointment and again documenting these at the appointment he seems now able to make appointments and I think this is going to be critical and keeping his virus under control.  He continues to have  some problems with dental pain and would like to have him seen by dental.     The visit was conducted through [place] translation in person  Past Medical History:  Diagnosis Date  . HIV disease ([place])   . Known health problems: none     Past Surgical History:  Procedure Laterality Date  . CHOLECYSTECTOMY N/A 10/11/2014   Procedure: LAPAROSCOPIC CHOLECYSTECTOMY WITH INTRAOPERATIVE CHOLANGIOGRAM;  Surgeon: Coralie Keens, MD;  Location: [place];  Service: General;  Laterality: N/A;  . ERCP N/A 10/09/2014   Procedure: ENDOSCOPIC RETROGRADE CHOLANGIOPANCREATOGRAPHY (ERCP);  Surgeon: Carol Ada, MD;  Location: [facility] ENDOSCOPY;  Service: Endoscopy;  Laterality: N/A;    No family history on file.    Social History   Socioeconomic History  . Marital status: Single    Spouse name: Not on file  . Number of children: Not on file  . Years of education: Not on file  . Highest education level: Not on file  Occupational History  . Not on file  Social Needs  . Financial resource strain: Not on file  . Food insecurity    Worry: Not on file    Inability: Not on file  . Transportation needs    Medical: Not on file    Non-medical: Not on file  Tobacco Use  . Smoking status: Never Smoker  . Smokeless tobacco: Never Used  Substance and Sexual Activity  . Alcohol use: Yes  Alcohol/week: 1.0 standard drinks    Types: 1 Cans of beer per week    Comment: socially  . Drug use: Not Currently  . Sexual activity: Not Currently    Partners: Female    Comment: offered condoms 03/2019  Lifestyle  . Physical activity    Days per week: Not on file    Minutes per session: Not on file  . Stress: Not on file  Relationships  . Social Herbalist on phone: Not on file    Gets together: Not on file    Attends religious service: Not on file    Active member of club or organization: Not on file    Attends meetings of clubs or organizations: Not on file    Relationship status: Not on file   Other Topics Concern  . Not on file  Social History Narrative   ** Merged History Encounter **        Allergies  Allergen Reactions  . Penicillins Hives and Itching    Has patient had a PCN reaction causing immediate rash, facial/tongue/throat swelling, SOB or lightheadedness with hypotension: Yes Has patient had a PCN reaction causing severe rash involving mucus membranes or skin necrosis: No Has patient had a PCN reaction that required hospitalization No Has patient had a PCN reaction occurring within the last 10 years: No If all of the above answers are "NO", then may proceed with Cephalosporin use.     Current Outpatient Medications:  .  BIKTARVY 50-200-25 MG TABS tablet, TK 1 T PO D, Disp: 30 tablet, Rfl: 11 .  benzonatate (TESSALON) 100 MG capsule, Take 1 capsule (100 mg total) by mouth 2 (two) times daily as needed for cough., Disp: 30 capsule, Rfl: 2 .  fluconazole (DIFLUCAN) 100 MG tablet, Take 1 tablet (100 mg total) by mouth daily., Disp: 30 tablet, Rfl: 2  Review of Systems  Constitutional: Negative for chills and fever.  HENT: Positive for dental problem. Negative for congestion and sore throat.   Eyes: Negative for photophobia.  Respiratory: Positive for cough. Negative for shortness of breath and wheezing.   Cardiovascular: Negative for chest pain, palpitations and leg swelling.  Gastrointestinal: Negative for abdominal pain, blood in stool, constipation, diarrhea, nausea and vomiting.  Genitourinary: Negative for dysuria, flank pain and hematuria.  Musculoskeletal: Positive for arthralgias. Negative for back pain and myalgias.  Skin: Negative for rash.  Neurological: Negative for dizziness, weakness and headaches.  Hematological: Does not bruise/bleed easily.  Psychiatric/Behavioral: Negative for suicidal ideas.       Objective:   Physical Exam HENT:     Head: Normocephalic and atraumatic.     Nose: Nose normal.     Mouth/Throat:     Mouth: Mucous  membranes are dry.     Dentition: Abnormal dentition. Dental caries present.     Pharynx: Oropharyngeal exudate present. No posterior oropharyngeal erythema.  Eyes:     Extraocular Movements: Extraocular movements intact.     Conjunctiva/sclera: Conjunctivae normal.  Cardiovascular:     Rate and Rhythm: Normal rate and regular rhythm.     Heart sounds: No murmur. No friction rub. No gallop.   Pulmonary:     Effort: Pulmonary effort is normal. No respiratory distress.     Breath sounds: Normal breath sounds. No stridor. No wheezing or rhonchi.  Abdominal:     General: There is no distension.  Skin:    Coloration: Skin is pale. Skin is not jaundiced.  Neurological:  General: No focal deficit present.     Mental Status: He is alert and oriented to person, place, and time.  Psychiatric:        Mood and Affect: Mood normal.        Behavior: Behavior normal.        Thought Content: Thought content normal.           Assessment & Plan:   AIDS and HIV disease: We will check labs today again and continue meds. Cassie to meet with him on Dec 6th and I schedule him with me in January.  Cavitary pneumonia this seems clinically to have resolved so he can finish his levofloxacin and will not have him take further antibiotics   Thrush: Continue fluconazole for now  Dental caries: We will try to get into the dental clinic here again again arranges in a very methodical way.    Cognitive impairment:  continue to have a meet with Cassie have no box filled.  I spent greater than 25 minutes with the patient including greater than 50% of time in face to face counsel of the patient with Spanish translator regarding the nature of his HIV virus and how well he is done now to control it and how his immune system is responded dramatically in a short period of time I would continue to see him on a very close Lee monitor basis and coordination of his care.  

## 2019-04-26 LAB — T-HELPER CELL (CD4) - (RCID CLINIC ONLY)
CD4 % Helper T Cell: 1 % — ABNORMAL LOW (ref 33–65)
CD4 T Cell Abs: 36 /uL — ABNORMAL LOW (ref 400–1790)

## 2019-04-28 LAB — ACID FAST CULTURE WITH REFLEXED SENSITIVITIES (MYCOBACTERIA): Acid Fast Culture: NEGATIVE

## 2019-05-04 LAB — HIV-1 RNA QUANT-NO REFLEX-BLD
HIV 1 RNA Quant: 27 copies/mL — ABNORMAL HIGH
HIV-1 RNA Quant, Log: 1.43 Log copies/mL — ABNORMAL HIGH

## 2019-05-16 ENCOUNTER — Ambulatory Visit: Payer: Self-pay | Admitting: Pharmacist

## 2019-06-13 ENCOUNTER — Ambulatory Visit: Payer: Self-pay | Admitting: Pharmacist

## 2019-06-13 ENCOUNTER — Telehealth: Payer: Self-pay | Admitting: *Deleted

## 2019-06-13 ENCOUNTER — Ambulatory Visit: Payer: Self-pay | Admitting: Infectious Disease

## 2019-06-13 NOTE — Telephone Encounter (Signed)
OK per Dr Daiva Eves for this patient to be worked into clinic with any available provider, as he often works out of town and depends upon his employer to get him back for his visits.  Patient is working with Marthann Schiller and Kelby Fam as well. Andree Coss, RN

## 2019-07-22 ENCOUNTER — Encounter: Payer: Self-pay | Admitting: Infectious Disease

## 2019-08-10 ENCOUNTER — Other Ambulatory Visit: Payer: Self-pay | Admitting: Pharmacist

## 2019-08-10 DIAGNOSIS — B20 Human immunodeficiency virus [HIV] disease: Secondary | ICD-10-CM

## 2019-08-10 MED ORDER — SULFAMETHOXAZOLE-TRIMETHOPRIM 800-160 MG PO TABS: 1.0000 | ORAL_TABLET | Freq: Every day | ORAL | 5 refills | Status: DC

## 2019-08-24 ENCOUNTER — Other Ambulatory Visit: Payer: Self-pay | Admitting: Pharmacist

## 2019-08-24 ENCOUNTER — Ambulatory Visit: Payer: Self-pay | Admitting: Pharmacist

## 2019-08-24 ENCOUNTER — Other Ambulatory Visit: Payer: Self-pay

## 2019-08-24 DIAGNOSIS — Z79899 Other long term (current) drug therapy: Secondary | ICD-10-CM

## 2019-08-24 DIAGNOSIS — B20 Human immunodeficiency virus [HIV] disease: Secondary | ICD-10-CM

## 2019-08-25 LAB — T-HELPER CELL (CD4) - (RCID CLINIC ONLY)
CD4 % Helper T Cell: 2 % — ABNORMAL LOW (ref 33–65)
CD4 T Cell Abs: 37 /uL — ABNORMAL LOW (ref 400–1790)

## 2019-08-26 LAB — CBC
HCT: 49 % (ref 38.5–50.0)
Hemoglobin: 16.3 g/dL (ref 13.2–17.1)
MCH: 26.2 pg — ABNORMAL LOW (ref 27.0–33.0)
MCHC: 33.3 g/dL (ref 32.0–36.0)
MCV: 78.8 fL — ABNORMAL LOW (ref 80.0–100.0)
MPV: 10.8 fL (ref 7.5–12.5)
Platelets: 175 10*3/uL (ref 140–400)
RBC: 6.22 10*6/uL — ABNORMAL HIGH (ref 4.20–5.80)
RDW: 13.4 % (ref 11.0–15.0)
WBC: 6.1 10*3/uL (ref 3.8–10.8)

## 2019-08-26 LAB — COMPREHENSIVE METABOLIC PANEL
AG Ratio: 1.6 (calc) (ref 1.0–2.5)
ALT: 39 U/L (ref 9–46)
AST: 34 U/L (ref 10–40)
Albumin: 4.6 g/dL (ref 3.6–5.1)
Alkaline phosphatase (APISO): 133 U/L — ABNORMAL HIGH (ref 36–130)
BUN: 8 mg/dL (ref 7–25)
CO2: 27 mmol/L (ref 20–32)
Calcium: 9.7 mg/dL (ref 8.6–10.3)
Chloride: 101 mmol/L (ref 98–110)
Creat: 0.66 mg/dL (ref 0.60–1.35)
Globulin: 2.8 g/dL (calc) (ref 1.9–3.7)
Glucose, Bld: 97 mg/dL (ref 65–99)
Potassium: 4.7 mmol/L (ref 3.5–5.3)
Sodium: 137 mmol/L (ref 135–146)
Total Bilirubin: 1.6 mg/dL — ABNORMAL HIGH (ref 0.2–1.2)
Total Protein: 7.4 g/dL (ref 6.1–8.1)

## 2019-08-26 LAB — HIV-1 RNA QUANT-NO REFLEX-BLD
HIV 1 RNA Quant: 63900 copies/mL — ABNORMAL HIGH
HIV-1 RNA Quant, Log: 4.81 Log copies/mL — ABNORMAL HIGH

## 2019-11-11 ENCOUNTER — Ambulatory Visit: Payer: Self-pay | Admitting: Infectious Diseases

## 2020-02-14 ENCOUNTER — Encounter: Payer: Self-pay | Admitting: Infectious Diseases

## 2020-02-16 ENCOUNTER — Other Ambulatory Visit: Payer: Self-pay | Admitting: Pharmacist

## 2020-02-16 DIAGNOSIS — B20 Human immunodeficiency virus [HIV] disease: Secondary | ICD-10-CM

## 2020-02-16 MED ORDER — BIKTARVY 50-200-25 MG PO TABS: 1.0000 | ORAL_TABLET | Freq: Every day | ORAL | 5 refills | Status: DC

## 2020-02-16 MED ORDER — SULFAMETHOXAZOLE-TRIMETHOPRIM 800-160 MG PO TABS: 1.0000 | ORAL_TABLET | Freq: Every day | ORAL | 5 refills | Status: DC

## 2020-03-21 ENCOUNTER — Telehealth: Payer: Self-pay | Admitting: *Deleted

## 2020-03-21 NOTE — Telephone Encounter (Signed)
Spoke with Mitch/Cassie regarding patient's refills.  Per Marthann Schiller, the patient will take medication irregularly often skipping multiple doses, only gets refills when Mitch can bring it to him. Cassie has advised against this unless patient is ready to start taking medication daily. Patient has refills available at [facility] (sent 02/16/20) when he is ready to start taking meds daily. Andree Coss, RN

## 2020-04-20 ENCOUNTER — Ambulatory Visit: Payer: Self-pay | Admitting: Internal Medicine

## 2020-04-20 ENCOUNTER — Telehealth: Payer: Self-pay

## 2020-04-20 NOTE — Telephone Encounter (Signed)
Per Jon Love, [facility] patient not able to make it to his appointment today. Patient understands he is not able to get medication until he is seen. Will have patient reschedule appointment.

## 2020-07-24 ENCOUNTER — Inpatient Hospital Stay (HOSPITAL_COMMUNITY)
Admission: EM | Admit: 2020-07-24 | Discharge: 2020-07-30 | DRG: 974 | Disposition: A | Discharge status: Home / Self Care | Payer: HRSA Program | Attending: Internal Medicine | Admitting: Internal Medicine

## 2020-07-24 ENCOUNTER — Other Ambulatory Visit: Payer: Self-pay

## 2020-07-24 ENCOUNTER — Encounter (HOSPITAL_COMMUNITY): Payer: Self-pay | Admitting: Emergency Medicine

## 2020-07-24 ENCOUNTER — Emergency Department (HOSPITAL_COMMUNITY): Payer: HRSA Program

## 2020-07-24 DIAGNOSIS — J470 Bronchiectasis with acute lower respiratory infection: Secondary | ICD-10-CM | POA: Diagnosis present

## 2020-07-24 DIAGNOSIS — U071 COVID-19: Secondary | ICD-10-CM | POA: Diagnosis present

## 2020-07-24 DIAGNOSIS — J019 Acute sinusitis, unspecified: Secondary | ICD-10-CM | POA: Diagnosis present

## 2020-07-24 DIAGNOSIS — Z9114 Patient's other noncompliance with medication regimen: Secondary | ICD-10-CM

## 2020-07-24 DIAGNOSIS — K219 Gastro-esophageal reflux disease without esophagitis: Secondary | ICD-10-CM | POA: Diagnosis present

## 2020-07-24 DIAGNOSIS — G039 Meningitis, unspecified: Secondary | ICD-10-CM

## 2020-07-24 DIAGNOSIS — J1282 Pneumonia due to coronavirus disease 2019: Secondary | ICD-10-CM | POA: Diagnosis present

## 2020-07-24 DIAGNOSIS — B20 Human immunodeficiency virus [HIV] disease: Secondary | ICD-10-CM | POA: Diagnosis present

## 2020-07-24 DIAGNOSIS — Z6820 Body mass index (BMI) 20.0-20.9, adult: Secondary | ICD-10-CM

## 2020-07-24 DIAGNOSIS — B370 Candidal stomatitis: Secondary | ICD-10-CM | POA: Diagnosis present

## 2020-07-24 DIAGNOSIS — R7401 Elevation of levels of liver transaminase levels: Secondary | ICD-10-CM | POA: Diagnosis present

## 2020-07-24 DIAGNOSIS — R627 Adult failure to thrive: Secondary | ICD-10-CM | POA: Diagnosis present

## 2020-07-24 DIAGNOSIS — J159 Unspecified bacterial pneumonia: Secondary | ICD-10-CM | POA: Diagnosis present

## 2020-07-24 DIAGNOSIS — M795 Residual foreign body in soft tissue: Secondary | ICD-10-CM

## 2020-07-24 DIAGNOSIS — A419 Sepsis, unspecified organism: Secondary | ICD-10-CM

## 2020-07-24 DIAGNOSIS — R64 Cachexia: Secondary | ICD-10-CM | POA: Diagnosis present

## 2020-07-24 DIAGNOSIS — A4189 Other specified sepsis: Secondary | ICD-10-CM | POA: Diagnosis present

## 2020-07-24 DIAGNOSIS — Z789 Other specified health status: Secondary | ICD-10-CM

## 2020-07-24 DIAGNOSIS — J189 Pneumonia, unspecified organism: Secondary | ICD-10-CM | POA: Diagnosis present

## 2020-07-24 DIAGNOSIS — Z7289 Other problems related to lifestyle: Secondary | ICD-10-CM

## 2020-07-24 DIAGNOSIS — E43 Unspecified severe protein-calorie malnutrition: Secondary | ICD-10-CM | POA: Diagnosis present

## 2020-07-24 LAB — HEPATIC FUNCTION PANEL
ALT: 19 U/L (ref 0–44)
AST: 25 U/L (ref 15–41)
Albumin: 3 g/dL — ABNORMAL LOW (ref 3.5–5.0)
Alkaline Phosphatase: 89 U/L (ref 38–126)
Bilirubin, Direct: 0.3 mg/dL — ABNORMAL HIGH (ref 0.0–0.2)
Indirect Bilirubin: 1.5 mg/dL — ABNORMAL HIGH (ref 0.3–0.9)
Total Bilirubin: 1.8 mg/dL — ABNORMAL HIGH (ref 0.3–1.2)
Total Protein: 6.4 g/dL — ABNORMAL LOW (ref 6.5–8.1)

## 2020-07-24 LAB — RESP PANEL BY RT-PCR (FLU A&B, COVID) ARPGX2
Influenza A by PCR: NEGATIVE
Influenza B by PCR: NEGATIVE
SARS Coronavirus 2 by RT PCR: POSITIVE — AB

## 2020-07-24 LAB — CBC
HCT: 44.1 % (ref 39.0–52.0)
Hemoglobin: 14.5 g/dL (ref 13.0–17.0)
MCH: 25 pg — ABNORMAL LOW (ref 26.0–34.0)
MCHC: 32.9 g/dL (ref 30.0–36.0)
MCV: 76.2 fL — ABNORMAL LOW (ref 80.0–100.0)
Platelets: 436 10*3/uL — ABNORMAL HIGH (ref 150–400)
RBC: 5.79 MIL/uL (ref 4.22–5.81)
RDW: 13.3 % (ref 11.5–15.5)
WBC: 15.6 10*3/uL — ABNORMAL HIGH (ref 4.0–10.5)
nRBC: 0 % (ref 0.0–0.2)

## 2020-07-24 LAB — BASIC METABOLIC PANEL
Anion gap: 13 (ref 5–15)
BUN: 9 mg/dL (ref 6–20)
CO2: 21 mmol/L — ABNORMAL LOW (ref 22–32)
Calcium: 8.8 mg/dL — ABNORMAL LOW (ref 8.9–10.3)
Chloride: 96 mmol/L — ABNORMAL LOW (ref 98–111)
Creatinine, Ser: 0.86 mg/dL (ref 0.61–1.24)
GFR, Estimated: >60 mL/min (ref >60)
Glucose, Bld: 182 mg/dL — ABNORMAL HIGH (ref 70–99)
Potassium: 4 mmol/L (ref 3.5–5.1)
Sodium: 130 mmol/L — ABNORMAL LOW (ref 135–145)

## 2020-07-24 LAB — PROTIME-INR
INR: 1.2 (ref 0.8–1.2)
Prothrombin Time: 14.5 seconds (ref 11.4–15.2)

## 2020-07-24 LAB — APTT: aPTT: 37 seconds — ABNORMAL HIGH (ref 24–36)

## 2020-07-24 LAB — LACTIC ACID, PLASMA
Lactic Acid, Venous: 1.3 mmol/L (ref 0.5–1.9)
Lactic Acid, Venous: 1.8 mmol/L (ref 0.5–1.9)

## 2020-07-24 IMAGING — DX DG CHEST 1V PORT
1 series · 1 of 1 positions shown · non-contrast
Comparison: [DATE] radiograph, CT [DATE]

CLINICAL DATA: COVID positive.  Body aches and fever.  Febrile

EXAM:
PORTABLE CHEST 1 VIEW

[chest ap]
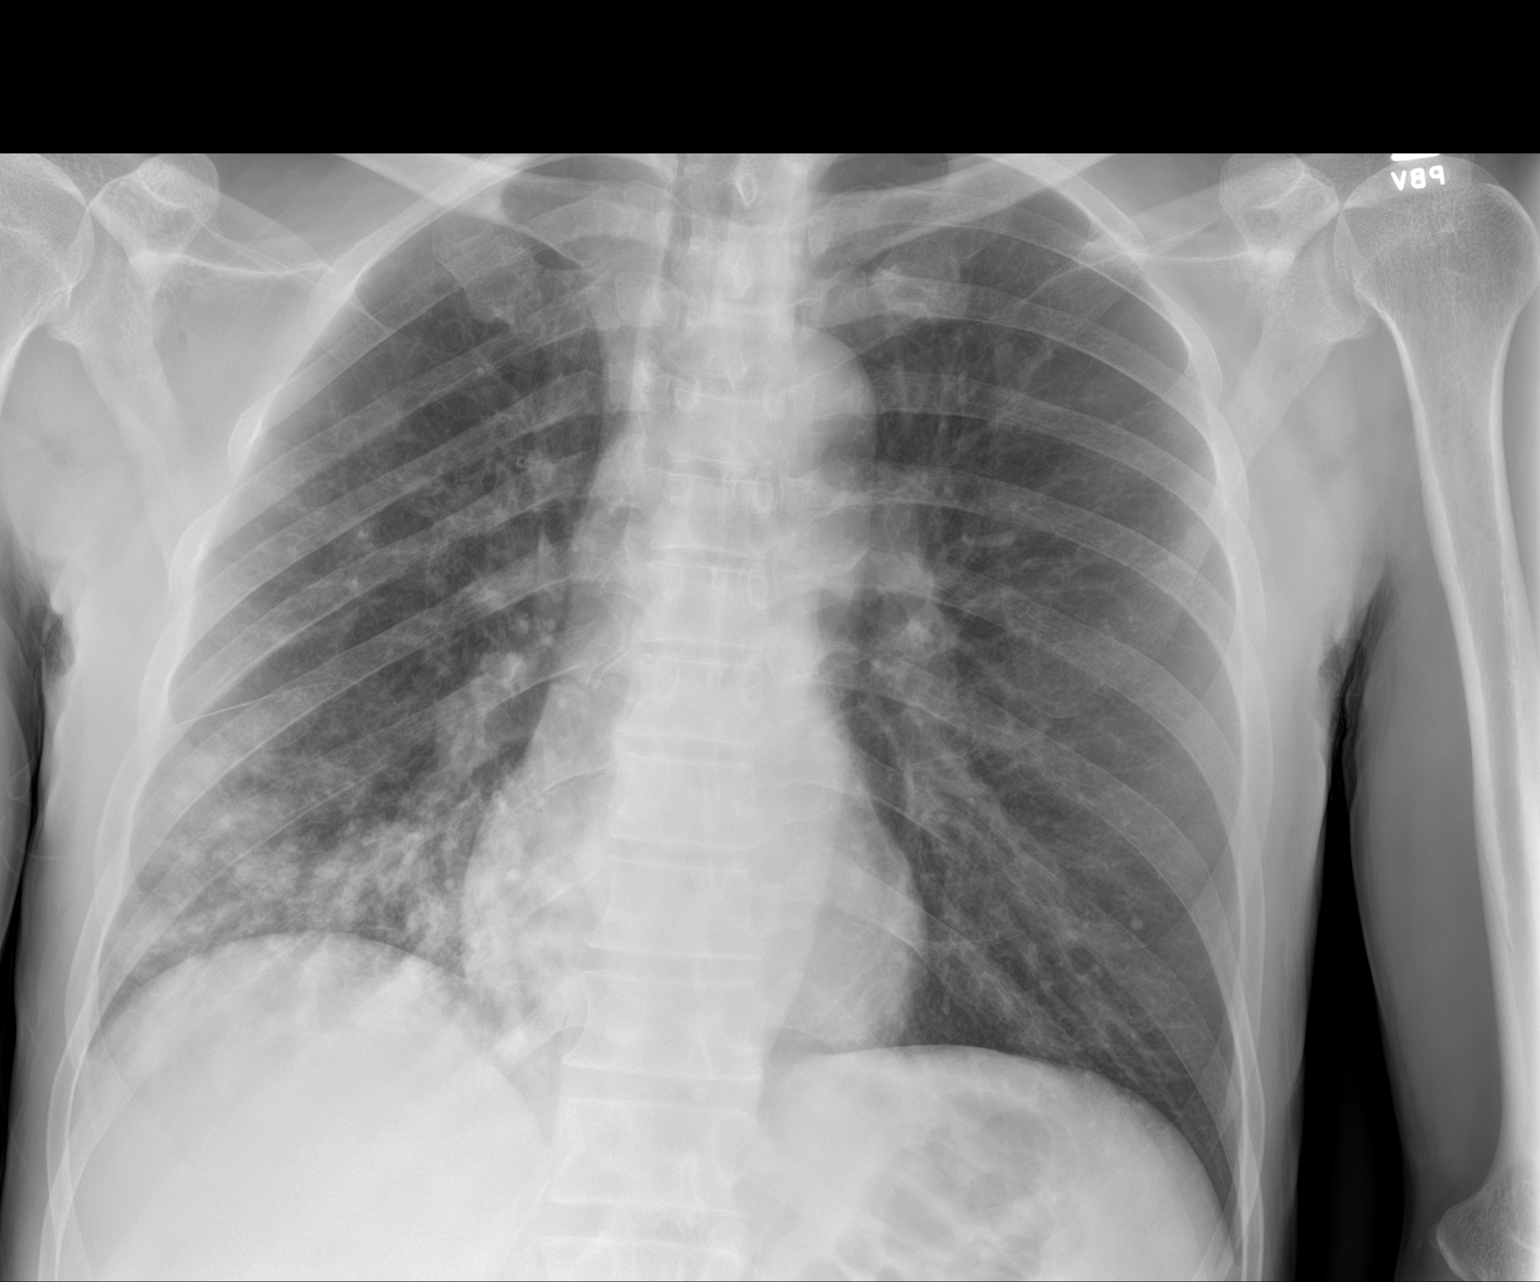

[1 of 1 positions shown; findings below may reference images not displayed]

FINDINGS: Normal cardiac silhouette. There is chronic nodular opacity in the
RIGHT lower lobe similar to comparison CT and radiograph. LEFT lung
is clear. No pneumothorax.
IMPRESSION: Chronic bronchiectasis and nodularity in the RIGHT lower lobe
consistent with chronic or recurrent pulmonary infection. Consider
aspiration and fungal infections.

## 2020-07-24 MED ORDER — SODIUM CHLORIDE 0.9 % IV SOLN: INTRAVENOUS | Status: DC

## 2020-07-24 MED ORDER — AZITHROMYCIN 250 MG PO TABS
500.0000 mg | ORAL_TABLET | Freq: Every day | ORAL | Status: DC
  Administered 2020-07-24: 500 mg via ORAL
  Filled 2020-07-24: qty 2

## 2020-07-24 MED ORDER — ZINC SULFATE 220 (50 ZN) MG PO CAPS
220.0000 mg | ORAL_CAPSULE | Freq: Every day | ORAL | Status: DC
  Administered 2020-07-24 – 2020-07-30 (×7): 220 mg via ORAL
  Filled 2020-07-24 (×7): qty 1

## 2020-07-24 MED ORDER — LACTATED RINGERS IV BOLUS (SEPSIS)
1000.0000 mL | Freq: Once | INTRAVENOUS | Status: AC
  Administered 2020-07-24: 1000 mL via INTRAVENOUS

## 2020-07-24 MED ORDER — ONDANSETRON HCL 4 MG/2ML IJ SOLN
4.0000 mg | Freq: Once | INTRAMUSCULAR | Status: AC
  Administered 2020-07-24: 4 mg via INTRAVENOUS
  Filled 2020-07-24: qty 2

## 2020-07-24 MED ORDER — SODIUM CHLORIDE 0.9 % IV SOLN
100.0000 mg | Freq: Every day | INTRAVENOUS | Status: AC
  Administered 2020-07-25 – 2020-07-28 (×4): 100 mg via INTRAVENOUS
  Filled 2020-07-24 (×6): qty 20

## 2020-07-24 MED ORDER — SODIUM CHLORIDE 0.9 % IV SOLN: 1.0000 g | INTRAVENOUS | Status: DC

## 2020-07-24 MED ORDER — SODIUM CHLORIDE 0.9 % IV SOLN
200.0000 mg | Freq: Once | INTRAVENOUS | Status: AC
  Administered 2020-07-24: 200 mg via INTRAVENOUS
  Filled 2020-07-24: qty 200

## 2020-07-24 MED ORDER — NYSTATIN 100000 UNIT/ML MT SUSP
5.0000 mL | Freq: Once | OROMUCOSAL | Status: AC
  Administered 2020-07-24: 500000 [IU] via ORAL
  Filled 2020-07-24: qty 5

## 2020-07-24 MED ORDER — ACETAMINOPHEN 325 MG PO TABS
650.0000 mg | ORAL_TABLET | Freq: Once | ORAL | Status: AC
  Administered 2020-07-24: 650 mg via ORAL
  Filled 2020-07-24: qty 2

## 2020-07-24 MED ORDER — SODIUM CHLORIDE 0.9 % IV SOLN
500.0000 mg | INTRAVENOUS | Status: DC
  Administered 2020-07-24 – 2020-07-25 (×2): 500 mg via INTRAVENOUS
  Filled 2020-07-24 (×3): qty 500

## 2020-07-24 MED ORDER — LEVOFLOXACIN IN D5W 750 MG/150ML IV SOLN: 750.0000 mg | Freq: Once | INTRAVENOUS | Status: DC

## 2020-07-24 MED ORDER — DEXAMETHASONE 6 MG PO TABS
6.0000 mg | ORAL_TABLET | ORAL | Status: DC
  Administered 2020-07-24 – 2020-07-25 (×2): 6 mg via ORAL
  Filled 2020-07-24 (×2): qty 1

## 2020-07-24 MED ORDER — LACTATED RINGERS IV SOLN: INTRAVENOUS | Status: AC

## 2020-07-24 MED ORDER — SODIUM CHLORIDE 0.9 % IV SOLN
2.0000 g | INTRAVENOUS | Status: AC
  Administered 2020-07-24 – 2020-07-28 (×5): 2 g via INTRAVENOUS
  Filled 2020-07-24 (×5): qty 20

## 2020-07-24 MED ORDER — ASCORBIC ACID 500 MG PO TABS
500.0000 mg | ORAL_TABLET | Freq: Every day | ORAL | Status: DC
  Administered 2020-07-24 – 2020-07-30 (×7): 500 mg via ORAL
  Filled 2020-07-24 (×7): qty 1

## 2020-07-24 MED ORDER — ENOXAPARIN SODIUM 40 MG/0.4ML ~~LOC~~ SOLN
40.0000 mg | SUBCUTANEOUS | Status: DC
  Administered 2020-07-24 – 2020-07-26 (×3): 40 mg via SUBCUTANEOUS
  Filled 2020-07-24 (×3): qty 0.4

## 2020-07-24 MED ORDER — ONDANSETRON HCL 4 MG/2ML IJ SOLN: 4.0000 mg | Freq: Four times a day (QID) | INTRAMUSCULAR | Status: DC | PRN

## 2020-07-24 MED ORDER — ONDANSETRON HCL 4 MG PO TABS: 4.0000 mg | ORAL_TABLET | Freq: Four times a day (QID) | ORAL | Status: DC | PRN

## 2020-07-24 MED ORDER — ACETAMINOPHEN 325 MG PO TABS
650.0000 mg | ORAL_TABLET | Freq: Four times a day (QID) | ORAL | Status: DC | PRN
  Administered 2020-07-24 – 2020-07-29 (×10): 650 mg via ORAL
  Filled 2020-07-24 (×10): qty 2

## 2020-07-24 NOTE — ED Provider Notes (Signed)
[facility] Provider Note   CSN: [id number] Arrival date & time: 07/24/20  1418     History Chief Complaint  Patient presents with  . Fever  . Generalized Body Aches    Anthonee Janece Canterburyntonio Hernadez is a 45 y.o. male.  HPI   Patient is a 45 year old male with a history of HIV/AIDS, microcytic anemia, thrush, who presents to the emergency department due to nausea and vomiting.  Patient states his symptoms started with a cough about 1 week ago.  States he was experiencing URI symptoms all week.  Within the past few days he began experiencing intractable nausea and vomiting.  Denies being able to handle any substantial p.o. intake.  Presents the ED today febrile.  Complaining of body aches.  Also notes chest pain as well as sore throat.  States that the started after multiple episodes of vomiting and notes that they feel like a burning irritation.  Additionally complains of mild epigastric pain.  No other regions of abdominal pain.  No other complaints at this time.  History obtained Via Spanish interpreter. He has not been vaccinated for COVID-19.  Per records, it appears the patient was taking Biktarvy for his HIV.  Patient has a history of noncompliance with his medication.  He tells me that he took it for 7 to 8 months but due to feeling lightheaded, he discontinued this medication about 1 month ago.  It appears that his last CD4 count was obtained on March 17 of 2021.  It was 37 at this time.     Past Medical History:  Diagnosis Date  . HIV disease (HCC)   . Known health problems: none     Patient Active Problem List   Diagnosis Date Noted  . Immigrant with language difficulty   . Cachexia (HCC)   . Poor dentition   . Multifocal pneumonia 03/13/2019  . HIV infection (HCC) 12/08/2018  . Microcytic anemia 12/08/2018  . Cavitary pneumonia 12/05/2018  . Alcohol use 12/05/2018  . Thrush of mouth and esophagus (HCC) 03/21/2016  . Anemia  08/16/2015  . AIDS (acquired immune deficiency syndrome) (HCC)   . Thrush   . Gastroesophageal reflux disease without esophagitis   . Streptococcal pneumonia (HCC) 03/17/2015  . Gout 10/12/2014  . Cholelithiasis 10/07/2014  . Protein-calorie malnutrition, severe (HCC) 10/07/2014  . 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 Choledocholithiasis   . Abnormal transaminases 10/06/2014    Past Surgical History:  Procedure Laterality Date  . CHOLECYSTECTOMY N/A 10/11/2014   Procedure: LAPAROSCOPIC CHOLECYSTECTOMY WITH INTRAOPERATIVE CHOLANGIOGRAM;  Surgeon: Abigail Miyamotoouglas Blackman, MD;  Location: [facility] OR;  Service: General;  Laterality: N/A;  . ERCP N/A 10/09/2014   Procedure: ENDOSCOPIC RETROGRADE CHOLANGIOPANCREATOGRAPHY (ERCP);  Surgeon: Jeani HawkingPatrick Hung, MD;  [facility];  Service: Endoscopy;  Laterality: N/A;       History reviewed. No pertinent family history.  Social History   Tobacco Use  . Smoking status: Never Smoker  . Smokeless tobacco: Never Used  Vaping Use  . Vaping Use: Never used  Substance Use Topics  . Alcohol use: Yes    Alcohol/week: 1.0 standard drink    Types: 1 Cans of beer per week    Comment: socially  . Drug use: Not Currently    Home Medications Prior to Admission medications   Medication Sig Start Date End Date Taking? Authorizing Provider  benzonatate (TESSALON) 100 MG capsule Take 1 capsule (100 mg total) by mouth 2 (two) times daily as needed for  cough. 04/11/19   Kuppelweiser, Cassie L, RPH-CPP  BIKTARVY 50-200-25 MG TABS tablet Take 1 tablet by mouth daily. 02/16/20   Kuppelweiser, Cassie L, RPH-CPP  fluconazole (DIFLUCAN) 100 MG tablet Take 1 tablet (100 mg total) by mouth daily. 03/18/19   Seawell, Jaimie A, DO  sulfamethoxazole-trimethoprim (BACTRIM DS) 800-160 MG tablet Take 1 tablet by mouth daily. 02/16/20   Kuppelweiser, Cassie L, RPH-CPP    Allergies    Penicillins  Review of Systems   Review of Systems  All other systems reviewed and are  negative. Ten systems reviewed and are negative for acute change, except as noted in the HPI.   Physical Exam Updated Vital Signs BP 105/73   Pulse 91   Temp (!) 101.9 F (38.8 C)   Resp 15   SpO2 94%   Physical Exam Vitals and nursing note reviewed.  Constitutional:      General: He is not in acute distress.    Appearance: Normal appearance. He is not ill-appearing, toxic-appearing or diaphoretic.  HENT:     Head: Normocephalic and atraumatic.     Right Ear: External ear normal.     Left Ear: External ear normal.     Nose: Nose normal.     Mouth/Throat:     Mouth: Mucous membranes are moist.     Pharynx: Oropharynx is clear. Posterior oropharyngeal erythema present. No oropharyngeal exudate.     Comments: Mild diffuse erythema noted to the posterior oropharynx.  Uvula midline.  Candida noted diffusely along the tongue, roof of the mouth, as well as the posterior oropharynx. Eyes:     General: No scleral icterus.       Right eye: No discharge.        Left eye: No discharge.     Extraocular Movements: Extraocular movements intact.     Conjunctiva/sclera: Conjunctivae normal.  Cardiovascular:     Rate and Rhythm: Regular rhythm. Tachycardia present.     Pulses: Normal pulses.     Heart sounds: Normal heart sounds. No murmur heard. No friction rub. No gallop.   Pulmonary:     Effort: Pulmonary effort is normal. No respiratory distress.     Breath sounds: Normal breath sounds. No stridor. No wheezing, rhonchi or rales.  Abdominal:     General: Abdomen is flat.     Palpations: Abdomen is soft.     Tenderness: There is abdominal tenderness.     Comments: Abdomen is flat and soft.  Mild epigastric tenderness noted.  Musculoskeletal:        General: Normal range of motion.     Cervical back: Normal range of motion and neck supple. No tenderness.  Skin:    General: Skin is warm and dry.  Neurological:     General: No focal deficit present.     Mental Status: He is alert and  oriented to person, place, and time.  Psychiatric:        Mood and Affect: Mood normal.        Behavior: Behavior normal.    ED Results / Procedures / Treatments   Labs (all labs ordered are listed, but only abnormal results are displayed) Labs Reviewed  RESP PANEL BY RT-PCR (FLU A&B, COVID) ARPGX2 - Abnormal; Notable for the following components:      Result Value   SARS Coronavirus 2 by RT PCR POSITIVE (*)    All other components within normal limits  CBC - Abnormal; Notable for the following components:   WBC 15.6 (*)  MCV 76.2 (*)    MCH 25.0 (*)    Platelets 436 (*)    All other components within normal limits  BASIC METABOLIC PANEL - Abnormal; Notable for the following components:   Sodium 130 (*)    Chloride 96 (*)    CO2 21 (*)    Glucose, Bld 182 (*)    Calcium 8.8 (*)    All other components within normal limits  APTT - Abnormal; Notable for the following components:   aPTT 37 (*)    All other components within normal limits  HEPATIC FUNCTION PANEL - Abnormal; Notable for the following components:   Total Protein 6.4 (*)    Albumin 3.0 (*)    Total Bilirubin 1.8 (*)    Bilirubin, Direct 0.3 (*)    Indirect Bilirubin 1.5 (*)    All other components within normal limits  CULTURE, BLOOD (ROUTINE X 2)  CULTURE, BLOOD (ROUTINE X 2)  URINE CULTURE  LACTIC ACID, PLASMA  PROTIME-INR  LACTIC ACID, PLASMA  URINALYSIS, ROUTINE W REFLEX MICROSCOPIC    EKG EKG Interpretation  Date/Time:  Tuesday July 24 2020 16:38:14 EST Ventricular Rate:  97 PR Interval:    QRS Duration: 80 QT Interval:  333 QTC Calculation: 423 R Axis:   48 Text Interpretation: Sinus rhythm Since last tracing rate slower Confirmed by Eber Hong ([id number]) on 07/24/2020 4:44:51 PM   Radiology DG Chest Port 1 View  Result Date: 07/24/2020 CLINICAL DATA:  COVID positive.  Body aches and fever.  Febrile EXAM: PORTABLE CHEST 1 VIEW COMPARISON:  03/13/2019 radiograph, CT 03/15/2019  FINDINGS: Normal cardiac silhouette. There is chronic nodular opacity in the RIGHT lower lobe similar to comparison CT and radiograph. LEFT lung is clear. No pneumothorax. IMPRESSION: Chronic bronchiectasis and nodularity in the RIGHT lower lobe consistent with chronic or recurrent pulmonary infection. Consider aspiration and fungal infections. Electronically Signed   By: Genevive Bi M.D.   On: 07/24/2020 17:01   Procedures .Critical Care Performed by: Placido Sou, PA-C Authorized by: Placido Sou, PA-C   Critical care provider statement:    Critical care time (minutes):  30   Critical care was necessary to treat or prevent imminent or life-threatening deterioration of the following conditions:  Sepsis   Critical care was time spent personally by me on the following activities:  Discussions with consultants, evaluation of patient's response to treatment, examination of patient, ordering and performing treatments and interventions, ordering and review of laboratory studies, ordering and review of radiographic studies, pulse oximetry, re-evaluation of patient's condition, obtaining history from patient or surrogate and review of old charts     Medications Ordered in ED Medications  lactated ringers infusion ( Intravenous New Bag/Given 07/24/20 1738)  cefTRIAXone (ROCEPHIN) 2 g in sodium chloride 0.9 % 100 mL IVPB (0 g Intravenous Stopped 07/24/20 1738)  azithromycin (ZITHROMAX) tablet 500 mg (500 mg Oral Given 07/24/20 1653)  acetaminophen (TYLENOL) tablet 650 mg (650 mg Oral Given 07/24/20 1431)  lactated ringers bolus 1,000 mL (1,000 mLs Intravenous New Bag/Given 07/24/20 1738)    And  lactated ringers bolus 1,000 mL (1,000 mLs Intravenous New Bag/Given 07/24/20 1738)  ondansetron (ZOFRAN) injection 4 mg (4 mg Intravenous Given 07/24/20 1705)  nystatin (MYCOSTATIN) 100000 UNIT/ML suspension 500,000 Units (500,000 Units Oral Given 07/24/20 1705)    ED Course  I have reviewed the  triage vital signs and the nursing notes.  Pertinent labs & imaging results that were available during my care of the patient were  reviewed by me and considered in my medical decision making (see chart for details).  Clinical Course as of 07/24/20 1827  Tue Jul 24, 2020  1624 SARS Coronavirus 2 by RT PCR(!): POSITIVE [LJ]  1708 DG Chest Port 1 View IMPRESSION: Chronic bronchiectasis and nodularity in the RIGHT lower lobe consistent with chronic or recurrent pulmonary infection. Consider aspiration and fungal infections. [LJ]  1708 WBC(!): 15.6 [LJ]    Clinical Course User Index [LJ] Placido Sou, PA-C   MDM Rules/Calculators/A&P                          Patient is a 45 year old male with history of HIV as well as medication noncompliance who presents the emergency department with viral URI symptoms as well as nausea and vomiting.  He has not been vaccinated for COVID-19 and was found to be positive for COVID-19 today.  Chest x-ray showing chronic bronchiectasis and nodularity in the right lower lobe consistent with chronic versus recurrent pulmonary infection.  Radiology recommends consideration of aspiration as well as fungal infections.  Patient's last CD4 count per records 11 months ago was 37.  He states that he was taking Biktarvy for 7 to 8 months but stopped about 1 month ago due to it making him feel lightheaded.  Unsure if he is experiencing an acute pneumonia and if so if this is related to COVID-19 versus an opportunistic infection.  Code sepsis initiated.  Patient initially febrile as well as tachycardic.  He was given Rocephin as well as azithromycin for likely pneumonia.  Fluid bolused.  Blood pressure stable around 105/73.  Tachycardia has resolved.  Given his complicated medical history as well as his septic presentation today feel that admission is reasonable.  Patient discussed with the medicine team who will accept him into their care.   Final Clinical Impression(s) /  ED Diagnoses Final diagnoses:  Sepsis, due to unspecified organism, unspecified whether acute organ dysfunction present [facility])  COVID-19   Rx / DC Orders ED Discharge Orders    None       Placido Sou, PA-C 07/24/20 1827    Eber Hong, MD 07/29/20 [phone]

## 2020-07-24 NOTE — ED Notes (Signed)
Attempted to give report to floor, this rn asked to call back because nurse was in middle of getting report.

## 2020-07-24 NOTE — Progress Notes (Signed)
Pharmacy Antibiotic Note  [facility] Jon Love is a 45 y.o. male admitted on 07/24/2020 with pneumonia.  Pharmacy originally consulted for levofloxacin dosing and to assess penicillin allergy. Documented penicillin allergy in 2016 listed as hives, itching.  Patient has tolerated ceftriaxone since.  Will switch levofloxacin to ceftriaxone and azithromycin.    Last qtc 402, WBC 15.6, Tm 101.9  Plan: Ceftriaxone 2g IV q24hours Azithromycin 500mg  PO daily F/u clinical improvement, LOT    Temp (24hrs), Avg:101.9 F (38.8 C), Min:101.9 F (38.8 C), Max:101.9 F (38.8 C)  Recent Labs  Lab 07/24/20 1435  WBC 15.6*  CREATININE 0.86    CrCl cannot be calculated (Unknown ideal weight.).    Allergies  Allergen Reactions  . Penicillins Hives and Itching    Has patient had a PCN reaction causing immediate rash, facial/tongue/throat swelling, SOB or lightheadedness with hypotension: Yes Has patient had a PCN reaction causing severe rash involving mucus membranes or skin necrosis: No Has patient had a PCN reaction that required hospitalization No Has patient had a PCN reaction occurring within the last 10 years: No If all of the above answers are "NO", then may proceed with Cephalosporin use.    Antimicrobials this admission: Ceftriaxone 2/15 >>  Azithromycin 2/15 >>    Microbiology results: 2/15 BCx: ordered 2/15 UCx: ordered   Thank you for allowing pharmacy to be a part of this patient's care.  3/15, PharmD PGY-1 Acute Care Pharmacy Resident Office: [phone] 07/24/2020 4:47 PM

## 2020-07-24 NOTE — ED Notes (Signed)
Pt ambulating in room spo2 drops to 93% on room air, pt reporting difficulty with weakness.

## 2020-07-24 NOTE — ED Notes (Signed)
Attempt to give report, floor nurse unavailable, this rn number left with Diplomatic Services operational officer

## 2020-07-24 NOTE — H&P (Signed)
History and Physical   [facility] ZOX:[id number] DOB: [birth year] DOA: 07/24/2020  Referring MD/NP/PA: Dr. Hyacinth MeekerMiller  PCP: Patient, No Pcp Per   Outpatient Specialists: Infectious disease  Patient coming from: Home  Chief Complaint: Fever with generalized aches  HPI: Jon Love is a 45 y.o. male with medical history significant of HIV AIDS with poor compliance to treatment, oral thrush, presents to the ER with nausea and vomiting.  Patient started with cough about a week ago.  He has full symptoms the whole week mainly upper respiratory infection.  Did not remember exposure to COVID-19.  Was not vaccinated against COVID-19.  In the last 2 to 3 days he has been having intractable nausea and vomiting.  He has not been able to eat or drink adequately.  He came in with fever and body aches.  Also some sore throat and chest discomfort.  In the ER patient was found to be COVID-19 positive.  Also evidence of Covid pneumonia.  He is not hypoxic but generally weak and debilitated.  Patient being admitted to the hospital for further evaluation and treatment especially with his immunocompromise status.  Apparently patient has not taken his medicine for about a month.  Last recorded CD4 count last year was 37.  Viral load was notably high also.  At this point patient been admitted for further evaluation and treatment.  ED Course: Temperature is 101.9 blood pressure 117/75, pulse 142 respirate 24 and oxygen sats 91% on room air.  White count 15.6 his platelets 436 sodium 130 potassium 4.0 chloride 96 CO2 21 calcium 8.9 glucose 182 INR 1.2.  Lactic acid 1.8.  COVID-19 screen is positive.  Chest x-ray showed chronic bronchiectasis and nodularity in the right lower lobe consistent with chronic pulmonary infection.  Patient being admitted to the hospital with COVID-19 infection failure to thrive with sepsis.  Review of Systems: As per HPI otherwise 10 point review of systems negative.     Past Medical History:  Diagnosis Date  . HIV disease (HCC)   . Known health problems: none     Past Surgical History:  Procedure Laterality Date  . CHOLECYSTECTOMY N/A 10/11/2014   Procedure: LAPAROSCOPIC CHOLECYSTECTOMY WITH INTRAOPERATIVE CHOLANGIOGRAM;  Surgeon: Abigail Miyamotoouglas Blackman, MD;  Location: [facility] OR;  Service: General;  Laterality: N/A;  . ERCP N/A 10/09/2014   Procedure: ENDOSCOPIC RETROGRADE CHOLANGIOPANCREATOGRAPHY (ERCP);  Surgeon: Jeani HawkingPatrick Hung, MD;  Location: [facility] ENDOSCOPY;  Service: Endoscopy;  Laterality: N/A;     reports that he has never smoked. He has never used smokeless tobacco. He reports current alcohol use of about 1.0 standard drink of alcohol per week. He reports previous drug use.  Allergies  Allergen Reactions  . Penicillins Hives and Itching    Has patient had a PCN reaction causing immediate rash, facial/tongue/throat swelling, SOB or lightheadedness with hypotension: Yes Has patient had a PCN reaction causing severe rash involving mucus membranes or skin necrosis: No Has patient had a PCN reaction that required hospitalization No Has patient had a PCN reaction occurring within the last 10 years: No If all of the above answers are "NO", then may proceed with Cephalosporin use.    History reviewed. No pertinent family history.   Prior to Admission medications   Medication Sig Start Date End Date Taking? Authorizing Provider  benzonatate (TESSALON) 100 MG capsule Take 1 capsule (100 mg total) by mouth 2 (two) times daily as needed for cough. 04/11/19   Kuppelweiser, Cassie L, RPH-CPP  BIKTARVY 50-200-25 MG TABS  tablet Take 1 tablet by mouth daily. 02/16/20   Kuppelweiser, Cassie L, RPH-CPP  fluconazole (DIFLUCAN) 100 MG tablet Take 1 tablet (100 mg total) by mouth daily. 03/18/19   Seawell, Jaimie A, DO  sulfamethoxazole-trimethoprim (BACTRIM DS) 800-160 MG tablet Take 1 tablet by mouth daily. 02/16/20   Kuppelweiser, Cassie L, RPH-CPP    Physical Exam: Vitals:    07/24/20 1422 07/24/20 1715  BP: 105/87 105/73  Pulse: (!) 142 91  Resp: (!) 24 15  Temp: (!) 101.9 F (38.8 C)   SpO2: 91% 94%      Constitutional: Chronically ill looking, cachectic Vitals:   07/24/20 1422 07/24/20 1715  BP: 105/87 105/73  Pulse: (!) 142 91  Resp: (!) 24 15  Temp: (!) 101.9 F (38.8 C)   SpO2: 91% 94%   Eyes: PERRL, lids and conjunctivae pale with mild jaundice ENMT: Mucous membranes are dry. Posterior pharynx clear of any exudate or lesions.Normal dentition.  Neck: normal, supple, no masses, no thyromegaly Respiratory: Coarse breath sound bilaterally no wheeze, no rales, normal respiratory effort. No accessory muscle use.  Cardiovascular: Sinus tachycardia, no murmurs / rubs / gallops. No extremity edema. 2+ pedal pulses. No carotid bruits.  Abdomen: no tenderness, no masses palpated. No hepatosplenomegaly. Bowel sounds positive.  Musculoskeletal: no clubbing / cyanosis. No joint deformity upper and lower extremities. Good ROM, no contractures. Normal muscle tone.  Skin: no rashes, lesions, ulcers. No induration Neurologic: CN 2-12 grossly intact. Sensation intact, DTR normal. Strength 5/5 in all 4.  Psychiatric: Confused, no agitation.     Labs on Admission: I have personally reviewed following labs and imaging studies  CBC: Recent Labs  Lab 07/24/20 1435  WBC 15.6*  HGB 14.5  HCT 44.1  MCV 76.2*  PLT 436*   Basic Metabolic Panel: Recent Labs  Lab 07/24/20 1435  NA 130*  K 4.0  CL 96*  CO2 21*  GLUCOSE 182*  BUN 9  CREATININE 0.86  CALCIUM 8.8*   GFR: CrCl cannot be calculated (Unknown ideal weight.). Liver Function Tests: Recent Labs  Lab 07/24/20 1618  AST 25  ALT 19  ALKPHOS 89  BILITOT 1.8*  PROT 6.4*  ALBUMIN 3.0*   No results for input(s): LIPASE, AMYLASE in the last 168 hours. No results for input(s): AMMONIA in the last 168 hours. Coagulation Profile: Recent Labs  Lab 07/24/20 1618  INR 1.2   Cardiac  Enzymes: No results for input(s): CKTOTAL, CKMB, CKMBINDEX, TROPONINI in the last 168 hours. BNP (last 3 results) No results for input(s): PROBNP in the last 8760 hours. HbA1C: No results for input(s): HGBA1C in the last 72 hours. CBG: No results for input(s): GLUCAP in the last 168 hours. Lipid Profile: No results for input(s): CHOL, HDL, LDLCALC, TRIG, CHOLHDL, LDLDIRECT in the last 72 hours. Thyroid Function Tests: No results for input(s): TSH, T4TOTAL, FREET4, T3FREE, THYROIDAB in the last 72 hours. Anemia Panel: No results for input(s): VITAMINB12, FOLATE, FERRITIN, TIBC, IRON, RETICCTPCT in the last 72 hours. Urine analysis:    Component Value Date/Time   COLORURINE YELLOW 03/13/2019 1620   APPEARANCEUR CLEAR 03/13/2019 1620   LABSPEC 1.012 03/13/2019 1620   PHURINE 7.0 03/13/2019 1620   GLUCOSEU NEGATIVE 03/13/2019 1620   HGBUR NEGATIVE 03/13/2019 1620   BILIRUBINUR NEGATIVE 03/13/2019 1620   BILIRUBINUR mod 09/17/2014 1105   KETONESUR NEGATIVE 03/13/2019 1620   PROTEINUR NEGATIVE 03/13/2019 1620   UROBILINOGEN 1.0 03/15/2015 2052   NITRITE NEGATIVE 03/13/2019 1620   LEUKOCYTESUR NEGATIVE 03/13/2019  1620   Sepsis Labs: @LABRCNTIP (procalcitonin:4,lacticidven:4) ) Recent Results (from the past 240 hour(s))  Resp Panel by RT-PCR (Flu A&B, Covid) Nasopharyngeal Swab     Status: Abnormal   Collection Time: 07/24/20  2:18 PM   Specimen: Nasopharyngeal Swab; Nasopharyngeal(NP) swabs in vial transport medium  Result Value Ref Range Status   SARS Coronavirus 2 by RT PCR POSITIVE (A) NEGATIVE Final    Comment: RESULT CALLED TO, READ BACK BY AND VERIFIED WITH: RN DSP [id number] 07/26/20 FCP (NOTE) SARS-CoV-2 target nucleic acids are DETECTED.  The SARS-CoV-2 RNA is generally detectable in upper respiratory specimens during the acute phase of infection. Positive results are indicative of the presence of the identified virus, but do not rule out bacterial infection or co-infection  with other pathogens not detected by the test. Clinical correlation with patient history and other diagnostic information is necessary to determine patient infection status. The expected result is Negative.  Fact Sheet for Patients: 852778  Fact Sheet for Healthcare Providers: BloggerCourse.com  This test is not yet approved or cleared by the [employer] FDA and  has been authorized for detection and/or diagnosis of SARS-CoV-2 by FDA under an Emergency Use Authorization (EUA).  This EUA will remain in effect (meaning this test can be used) for  the duration of  the COVID-19 declaration under Section 564(b)(1) of the Act, 21 U.S.C. section 360bbb-3(b)(1), unless the authorization is terminated or revoked sooner.     Influenza A by PCR NEGATIVE NEGATIVE Final   Influenza B by PCR NEGATIVE NEGATIVE Final    Comment: (NOTE) The Xpert Xpress SARS-CoV-2/FLU/RSV plus assay is intended as an aid in the diagnosis of influenza from Nasopharyngeal swab specimens and should not be used as a sole basis for treatment. Nasal washings and aspirates are unacceptable for Xpert Xpress SARS-CoV-2/FLU/RSV testing.  Fact Sheet for Patients: [place]  Fact Sheet for Healthcare Providers: BloggerCourse.com  This test is not yet approved or cleared by the [employer]ious[employer] FDA and has been authorized for detection and/or diagnosis of SARS-CoV-2 by FDA under an Emergency Use Authorization (EUA). This EUA will remain in effect (meaning this test can be used) for the duration of the COVID-19 declaration under Section 564(b)(1) of the Act, 21 U.S.C. section 360bbb-3(b)(1), unless the authorization is terminated or revoked.  Performed at [facility], [address], [place], [place]      Radiological Exams on Admission: DG Chest Port 1 View  Result Date:  07/24/2020 CLINICAL DATA:  COVID positive.  Body aches and fever.  Febrile EXAM: PORTABLE CHEST 1 VIEW COMPARISON:  03/13/2019 radiograph, CT 03/15/2019 FINDINGS: Normal cardiac silhouette. There is chronic nodular opacity in the RIGHT lower lobe similar to comparison CT and radiograph. LEFT lung is clear. No pneumothorax. IMPRESSION: Chronic bronchiectasis and nodularity in the RIGHT lower lobe consistent with chronic or recurrent pulmonary infection. Consider aspiration and fungal infections. Electronically Signed   By: 05/15/2019 M.D.   On: 07/24/2020 17:01    EKG: Independently reviewed.  Sinus tachycardia  Assessment/Plan Principal Problem:   Sepsis due to COVID-19 [facility]) Active Problems:   Protein-calorie malnutrition, severe (HCC)   Gastroesophageal reflux disease without esophagitis   AIDS (acquired immune deficiency syndrome) (HCC)   Alcohol use   Multifocal pneumonia     #1 sepsis secondary to COVID-19 infection: Patient does not have classic chest findings of COVID-19 but has nodularity consistent with infection.  He meets sepsis criteria.  Initiated on IV fluids full regimen of  antibiotics coverage.  Patient also started on remdesivir empirically.  We will get the steroids as well as empiric antibiotics.  Follow blood cultures.  #2 HIV AIDS: Has been noncompliant with his medications.  Patient will need ID consult in the morning to figure out next steps.  #3 severe protein calorie malnutrition: Most likely as a result of chronic illness.  Continue per nutrition.  #4 GERD: Continue with PPIs  #5 chronic alcohol use: Patient reports quitting alcohol.  We will watch for possible DT.   DVT prophylaxis: Lovenox Code Status: Full code Family Communication: No family at bedside Disposition Plan: To be determined Consults called: Consult infectious disease in the morning Admission status: Inpatient  Severity of Illness: The appropriate patient status for this patient is  INPATIENT. Inpatient status is judged to be reasonable and necessary in order to provide the required intensity of service to ensure the patient's safety. The patient's presenting symptoms, physical exam findings, and initial radiographic and laboratory data in the context of their chronic comorbidities is felt to place them at high risk for further clinical deterioration. Furthermore, it is not anticipated that the patient will be medically stable for discharge from the hospital within 2 midnights of admission. The following factors support the patient status of inpatient.   " The patient's presenting symptoms include fever and generalized weakness. " The worrisome physical exam findings include cachexia. " The initial radiographic and laboratory data are worrisome because of COVID-19 infection. " The chronic co-morbidities include HIV AIDS.   * I certify that at the point of admission it is my clinical judgment that the patient will require inpatient hospital care spanning beyond 2 midnights from the point of admission due to high intensity of service, high risk for further deterioration and high frequency of surveillance required.Lonia Blood MD [facility] Pager [phone]  If 7PM-7AM, please contact night-coverage [email or web address] Password [facility]  07/24/2020, 6:42 PM

## 2020-07-24 NOTE — Sepsis Progress Note (Signed)
Monitoring for code sepsis protocol. 

## 2020-07-24 NOTE — ED Triage Notes (Signed)
Patient from home. Complaint of body aches and fever that started yesterday. Febrile at triage. A&Ox4.

## 2020-07-24 NOTE — Progress Notes (Signed)
Pt brought to room from ED, pt A&Ox4, VSS, oriented pt to room and educated on how to use call light, tele placed on patient and verified, skin assessment WNL. 5 P's addressed, no other needs at this time.   [employer] Language link was used to interpret due to Spanish being pts primary language, spoke with interpreter Darien Ramus [phone]) and Peyton Najjar [phone]).

## 2020-07-25 DIAGNOSIS — Z7289 Other problems related to lifestyle: Secondary | ICD-10-CM

## 2020-07-25 DIAGNOSIS — U071 COVID-19: Secondary | ICD-10-CM | POA: Diagnosis not present

## 2020-07-25 DIAGNOSIS — A4189 Other specified sepsis: Secondary | ICD-10-CM | POA: Diagnosis not present

## 2020-07-25 LAB — CBC WITH DIFFERENTIAL/PLATELET
Abs Immature Granulocytes: 0.27 10*3/uL — ABNORMAL HIGH (ref 0.00–0.07)
Basophils Absolute: 0 10*3/uL (ref 0.0–0.1)
Basophils Relative: 0 %
Eosinophils Absolute: 0 10*3/uL (ref 0.0–0.5)
Eosinophils Relative: 0 %
HCT: 33 % — ABNORMAL LOW (ref 39.0–52.0)
Hemoglobin: 11.5 g/dL — ABNORMAL LOW (ref 13.0–17.0)
Immature Granulocytes: 2 %
Lymphocytes Relative: 8 %
Lymphs Abs: 1.1 10*3/uL (ref 0.7–4.0)
MCH: 26.4 pg (ref 26.0–34.0)
MCHC: 34.8 g/dL (ref 30.0–36.0)
MCV: 75.7 fL — ABNORMAL LOW (ref 80.0–100.0)
Monocytes Absolute: 0.3 10*3/uL (ref 0.1–1.0)
Monocytes Relative: 2 %
Neutro Abs: 11.9 10*3/uL — ABNORMAL HIGH (ref 1.7–7.7)
Neutrophils Relative %: 88 %
Platelets: 308 10*3/uL (ref 150–400)
RBC: 4.36 MIL/uL (ref 4.22–5.81)
RDW: 13.4 % (ref 11.5–15.5)
WBC: 13.6 10*3/uL — ABNORMAL HIGH (ref 4.0–10.5)
nRBC: 0 % (ref 0.0–0.2)

## 2020-07-25 LAB — URINALYSIS, ROUTINE W REFLEX MICROSCOPIC
Bilirubin Urine: NEGATIVE
Glucose, UA: NEGATIVE mg/dL
Hgb urine dipstick: NEGATIVE
Ketones, ur: NEGATIVE mg/dL
Leukocytes,Ua: NEGATIVE
Nitrite: NEGATIVE
Protein, ur: NEGATIVE mg/dL
Specific Gravity, Urine: 1.015 (ref 1.005–1.030)
pH: 7 (ref 5.0–8.0)

## 2020-07-25 LAB — COMPREHENSIVE METABOLIC PANEL
ALT: 17 U/L (ref 0–44)
AST: 19 U/L (ref 15–41)
Albumin: 2.5 g/dL — ABNORMAL LOW (ref 3.5–5.0)
Alkaline Phosphatase: 80 U/L (ref 38–126)
Anion gap: 11 (ref 5–15)
BUN: 7 mg/dL (ref 6–20)
CO2: 21 mmol/L — ABNORMAL LOW (ref 22–32)
Calcium: 8.2 mg/dL — ABNORMAL LOW (ref 8.9–10.3)
Chloride: 101 mmol/L (ref 98–111)
Creatinine, Ser: 0.63 mg/dL (ref 0.61–1.24)
GFR, Estimated: >60 mL/min (ref >60)
Glucose, Bld: 162 mg/dL — ABNORMAL HIGH (ref 70–99)
Potassium: 3.5 mmol/L (ref 3.5–5.1)
Sodium: 133 mmol/L — ABNORMAL LOW (ref 135–145)
Total Bilirubin: 0.8 mg/dL (ref 0.3–1.2)
Total Protein: 5.4 g/dL — ABNORMAL LOW (ref 6.5–8.1)

## 2020-07-25 LAB — C-REACTIVE PROTEIN: CRP: 11.4 mg/dL — ABNORMAL HIGH (ref <1.0)

## 2020-07-25 LAB — D-DIMER, QUANTITATIVE: D-Dimer, Quant: 0.29 ug/mL-FEU (ref 0.00–0.50)

## 2020-07-25 LAB — FERRITIN: Ferritin: 501 ng/mL — ABNORMAL HIGH (ref 24–336)

## 2020-07-25 LAB — MAGNESIUM: Magnesium: 1.6 mg/dL — ABNORMAL LOW (ref 1.7–2.4)

## 2020-07-25 LAB — MRSA PCR SCREENING: MRSA by PCR: NEGATIVE

## 2020-07-25 LAB — PHOSPHORUS: Phosphorus: 3.6 mg/dL (ref 2.5–4.6)

## 2020-07-25 MED ORDER — METHYLPREDNISOLONE SODIUM SUCC 40 MG IJ SOLR
40.0000 mg | Freq: Two times a day (BID) | INTRAMUSCULAR | Status: DC
  Administered 2020-07-25 – 2020-07-27 (×4): 40 mg via INTRAVENOUS
  Filled 2020-07-25 (×4): qty 1

## 2020-07-25 MED ORDER — MAGNESIUM SULFATE 2 GM/50ML IV SOLN
2.0000 g | Freq: Once | INTRAVENOUS | Status: AC
  Administered 2020-07-25: 2 g via INTRAVENOUS
  Filled 2020-07-25: qty 50

## 2020-07-25 MED ORDER — AZITHROMYCIN 600 MG PO TABS: 1200.0000 mg | ORAL_TABLET | ORAL | Status: DC

## 2020-07-25 MED ORDER — SULFAMETHOXAZOLE-TRIMETHOPRIM 400-80 MG PO TABS
1.0000 | ORAL_TABLET | Freq: Every day | ORAL | Status: DC
  Administered 2020-07-25 – 2020-07-30 (×6): 1 via ORAL
  Filled 2020-07-25 (×6): qty 1

## 2020-07-25 MED ORDER — PANTOPRAZOLE SODIUM 40 MG PO TBEC
40.0000 mg | DELAYED_RELEASE_TABLET | Freq: Every day | ORAL | Status: DC
  Administered 2020-07-25 – 2020-07-30 (×6): 40 mg via ORAL
  Filled 2020-07-25 (×6): qty 1

## 2020-07-25 NOTE — Progress Notes (Addendum)
Ok to add stop date of 5d to ceftriaxone/azith and order HIV VL per Dr. Sloan Leiter.   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  Onnie Boer, PharmD, BCIDP, AAHIVP, CPP Infectious Disease Pharmacist 07/25/2020 10:30 AM

## 2020-07-25 NOTE — Progress Notes (Signed)
PROGRESS NOTE                                                                                                                                                                                                             Patient Demographics:    [facility], is a 45 y.o. male, DOB - 04-10-1976, WGN:[id number]  Outpatient Primary MD for the patient is Patient, No Pcp Per   Admit date - 07/24/2020   LOS - 1  Chief Complaint  Patient presents with  . Fever  . Generalized Body Aches       Brief Narrative: Patient is a 45 y.o. male with PMHx of HIV-noncompliant with ART-presented to the hospital with 1 week history of cough/fevers and myalgias.  Found to have COVID-19 infection and subsequently admitted to the hospitalist service.  See below for further details  COVID-19 vaccinated status: Unvaccinated  Significant Events: 2/15>> Admit to [facility] for possible COVID-19 pneumonia  Significant studies: 2/15>>Chest x-ray: Chronic bronchiectasis/nodularity in the right LLL  COVID-19 medications: Steroids: 2/15>> Remdesivir: 2/15>>  Antibiotics: Rocephin: 2/15>> Zithromax: 2/15>>  Microbiology data: 2/15 >>blood culture: No growth  Procedures: None  Consults: None  DVT prophylaxis: enoxaparin (LOVENOX) injection 40 mg Start: 07/24/20 1845    Subjective:    [facility] today continues to complain of cough-he is on room air.   Assessment  & Plan :   COVID-19 infection-possible RLL pneumonia: Feels better-not on room air given severe immunocompromise status-reasonable to continue with steroids/Remdesivir/IV Rocephin/Zithromax.  If clinical improvement continues-we can contemplate discharge of the next few days.   Fever: afebrile O2 requirements:  SpO2: 95 %   COVID-19 Labs: Recent Labs    07/25/20 0129  DDIMER 0.29  FERRITIN 501*  CRP 11.4*    No results found for: BNP  No  results for input(s): PROCALCITON in the last 168 hours.  Lab Results  Component Value Date   SARSCOV2NAA POSITIVE (A) 07/24/2020   SARSCOV2NAA NEGATIVE 03/13/2019   SARSCOV2NAA NEGATIVE 12/05/2018     Prone/Incentive Spirometry: encouraged incentive spirometry use 3-4/hour.  HIV/AIDS: Noncompliant with antiretrovirals-on Bactrim/Zithromax for prophylaxis.  Await-CD4 count/viral load.  History of alcohol: Watch for withdrawal symptoms  GI prophylaxis: PP  ABG:    Component Value Date/Time   PHART 7.455 (H) 03/16/2015 0133   PCO2ART 30.0 (L) 03/16/2015 0133  PO2ART 80.0 03/16/2015 0133   HCO3 21.1 03/16/2015 0133   TCO2 22 03/16/2015 0133   ACIDBASEDEF 2.0 03/16/2015 0133   O2SAT 97.0 03/16/2015 0133    Vent Settings: N/A   Condition -Stable  Family Communication  : Patient will update family himself.  Code Status :  Full Code  Diet :  Diet Order            Diet regular Room service appropriate? Yes; Fluid consistency: Thin  Diet effective now                  Disposition Plan  :   Status is: Inpatient  Remains inpatient appropriate because:Inpatient level of care appropriate due to severity of illness   Dispo: The patient is from: Home              Anticipated d/c is to: Home              Anticipated d/c date is: 3 days              Patient currently is not medically stable to d/c.   Difficult to place patient No   Barriers to discharge: complete 5 days of IV Remdesivir  Antimicorbials  :    Anti-infectives (From admission, onward)   Start     Dose/Rate Route Frequency Ordered Stop   08/04/20 1000  azithromycin (ZITHROMAX) tablet 1,200 mg        1,200 mg Oral Weekly 07/25/20 1049     07/25/20 1200  sulfamethoxazole-trimethoprim (BACTRIM) 400-80 MG per tablet 1 tablet        1 tablet Oral Daily 07/25/20 1047     07/25/20 1000  remdesivir 100 mg in sodium chloride 0.9 % 100 mL IVPB       "Followed by" Linked Group Details   100 mg 200 mL/hr over  30 Minutes Intravenous Daily 07/24/20 1841 07/29/20 0959   07/24/20 1845  remdesivir 200 mg in sodium chloride 0.9% 250 mL IVPB       "Followed by" Linked Group Details   200 mg 580 mL/hr over 30 Minutes Intravenous Once 07/24/20 1841 07/24/20 2203   07/24/20 1845  azithromycin (ZITHROMAX) 500 mg in sodium chloride 0.9 % 250 mL IVPB        500 mg 250 mL/hr over 60 Minutes Intravenous Every 24 hours 07/24/20 1841 07/28/20 2359   07/24/20 1845  cefTRIAXone (ROCEPHIN) 1 g in sodium chloride 0.9 % 100 mL IVPB  Status:  Discontinued        1 g 200 mL/hr over 30 Minutes Intravenous Every 24 hours 07/24/20 1841 07/24/20 1845   07/24/20 1645  cefTRIAXone (ROCEPHIN) 2 g in sodium chloride 0.9 % 100 mL IVPB        2 g 200 mL/hr over 30 Minutes Intravenous Every 24 hours 07/24/20 1644 07/28/20 2359   07/24/20 1645  azithromycin (ZITHROMAX) tablet 500 mg  Status:  Discontinued        500 mg Oral Daily 07/24/20 1644 07/24/20 1846   07/24/20 1630  levofloxacin (LEVAQUIN) IVPB 750 mg  Status:  Discontinued        750 mg 100 mL/hr over 90 Minutes Intravenous  Once 07/24/20 1619 07/24/20 1642      Inpatient Medications  Scheduled Meds: . vitamin C  500 mg Oral Daily  . [START ON 08/04/2020] azithromycin  1,200 mg Oral Weekly  . dexamethasone  6 mg Oral Q24H  . enoxaparin (LOVENOX) injection  40 mg Subcutaneous  Q24H  . sulfamethoxazole-trimethoprim  1 tablet Oral Daily  . zinc sulfate  220 mg Oral Daily   Continuous Infusions: . sodium chloride 100 mL/hr at 07/24/20 2142  . azithromycin 500 mg (07/24/20 2233)  . cefTRIAXone (ROCEPHIN)  IV Stopped (07/24/20 1738)  . lactated ringers Stopped (07/24/20 2100)  . remdesivir 100 mg in NS 100 mL 100 mg (07/25/20 0937)   PRN Meds:.acetaminophen, ondansetron **OR** ondansetron (ZOFRAN) IV   Time Spent in minutes  25  See all Orders from today for further details   Jeoffrey Massed M.D on 07/25/2020 at 11:26 AM  To page go to [email or web address] - use  universal password  [facility] -  Office  [phone]    Objective:   Vitals:   07/24/20 2155 07/25/20 0000 07/25/20 0400 07/25/20 0725  BP:  116/75 117/70 94/60  Pulse:  89 74 87  Resp:  18 20 18   Temp:  98.3 F (36.8 C) 98.2 F (36.8 C) 98.7 F (37.1 C)  TempSrc:  Oral Axillary Axillary  SpO2:  93% 94% 95%  Weight: 64.3 kg     Height: 5\' 10"  (1.778 m)       Wt Readings from Last 3 Encounters:  07/24/20 64.3 kg  04/25/19 63 kg  03/30/19 62.3 kg     Intake/Output Summary (Last 24 hours) at 07/25/2020 1126 Last data filed at 07/25/2020 0941 Gross per 24 hour  Intake 1198.52 ml  Output 1150 ml  Net 48.52 ml     Physical Exam Gen Exam:Alert awake-not in any distress HEENT:atraumatic, normocephalic Chest: B/L clear to auscultation anteriorly CVS:S1S2 regular Abdomen:soft non tender, non distended Extremities:no edema Neurology: Non focal Skin: no rash   Data Review:    CBC Recent Labs  Lab 07/24/20 1435 07/25/20 0129  WBC 15.6* 13.6*  HGB 14.5 11.5*  HCT 44.1 33.0*  PLT 436* 308  MCV 76.2* 75.7*  MCH 25.0* 26.4  MCHC 32.9 34.8  RDW 13.3 13.4  LYMPHSABS  --  1.1  MONOABS  --  0.3  EOSABS  --  0.0  BASOSABS  --  0.0    Chemistries  Recent Labs  Lab 07/24/20 1435 07/24/20 1618 07/25/20 0129  NA 130*  --  133*  K 4.0  --  3.5  CL 96*  --  101  CO2 21*  --  21*  GLUCOSE 182*  --  162*  BUN 9  --  7  CREATININE 0.86  --  0.63  CALCIUM 8.8*  --  8.2*  MG  --   --  1.6*  AST  --  25 19  ALT  --  19 17  ALKPHOS  --  89 80  BILITOT  --  1.8* 0.8   ------------------------------------------------------------------------------------------------------------------ No results for input(s): CHOL, HDL, LDLCALC, TRIG, CHOLHDL, LDLDIRECT in the last 72 hours.  Lab Results  Component Value Date   HGBA1C 6.3 (H) 03/16/2015    ------------------------------------------------------------------------------------------------------------------ No results for input(s): TSH, T4TOTAL, T3FREE, THYROIDAB in the last 72 hours.  Invalid input(s): FREET3 ------------------------------------------------------------------------------------------------------------------ Recent Labs    07/25/20 0129  FERRITIN 501*    Coagulation profile Recent Labs  Lab 07/24/20 1618  INR 1.2    Recent Labs    07/25/20 0129  DDIMER 0.29    Cardiac Enzymes No results for input(s): CKMB, TROPONINI, MYOGLOBIN in the last 168 hours.  Invalid input(s): CK ------------------------------------------------------------------------------------------------------------------ No results found for: BNP  Micro Results Recent Results (from the past 240 hour(s))  Resp Panel by  RT-PCR (Flu A&B, Covid) Nasopharyngeal Swab     Status: Abnormal   Collection Time: 07/24/20  2:18 PM   Specimen: Nasopharyngeal Swab; Nasopharyngeal(NP) swabs in vial transport medium  Result Value Ref Range Status   SARS Coronavirus 2 by RT PCR POSITIVE (A) NEGATIVE Final    Comment: RESULT CALLED TO, READ BACK BY AND VERIFIED WITH: RN DSP [id number] FCP (NOTE) SARS-CoV-2 target nucleic acids are DETECTED.  The SARS-CoV-2 RNA is generally detectable in upper respiratory specimens during the acute phase of infection. Positive results are indicative of the presence of the identified virus, but do not rule out bacterial infection or co-infection with other pathogens not detected by the test. Clinical correlation with patient history and other diagnostic information is necessary to determine patient infection status. The expected result is Negative.  Fact Sheet for Patients: BloggerCourse.comhttps://www.fda.gov/media/152166/download  Fact Sheet for Healthcare Providers: SeriousBroker.ithttps://www.fda.gov/media/152162/download  This test is not yet approved or cleared by the [place]  FDA and  has been authorized for detection and/or diagnosis of SARS-CoV-2 by FDA under an Emergency Use Authorization (EUA).  This EUA will remain in effect (meaning this test can be used) for  the duration of  the COVID-19 declaration under Section 564(b)(1) of the Act, 21 U.S.C. section 360bbb-3(b)(1), unless the authorization is terminated or revoked sooner.     Influenza A by PCR NEGATIVE NEGATIVE Final   Influenza B by PCR NEGATIVE NEGATIVE Final    Comment: (NOTE) The Xpert Xpress SARS-CoV-2/FLU/RSV plus assay is intended as an aid in the diagnosis of influenza from Nasopharyngeal swab specimens and should not be used as a sole basis for treatment. Nasal washings and aspirates are unacceptable for Xpert Xpress SARS-CoV-2/FLU/RSV testing.  Fact Sheet for Patients: BloggerCourse.comhttps://www.fda.gov/media/152166/download  Fact Sheet for Healthcare Providers: SeriousBroker.ithttps://www.fda.gov/media/152162/download  This test is not yet approved or cleared by the [place] FDA and has been authorized for detection and/or diagnosis of SARS-CoV-2 by FDA under an Emergency Use Authorization (EUA). This EUA will remain in effect (meaning this test can be used) for the duration of the COVID-19 declaration under Section 564(b)(1) of the Act, 21 U.S.C. section 360bbb-3(b)(1), unless the authorization is terminated or revoked.  Performed at [facility], [address] [place], [place] [address]   Blood Culture (routine x 2)     Status: None (Preliminary result)   Collection Time: 07/24/20  4:30 PM   Specimen: BLOOD  Result Value Ref Range Status   Specimen Description BLOOD SITE NOT SPECIFIED  Final   Special Requests   Final    BOTTLES DRAWN AEROBIC AND ANAEROBIC Blood Culture adequate volume   Culture   Final    NO GROWTH < 12 HOURS Performed at [facility], [address] [place], [place] [address]    Report Status PENDING  Incomplete  Blood Culture (routine x 2)     Status: None  (Preliminary result)   Collection Time: 07/24/20  4:45 PM   Specimen: BLOOD  Result Value Ref Range Status   Specimen Description BLOOD SITE NOT SPECIFIED  Final   Special Requests   Final    BOTTLES DRAWN AEROBIC AND ANAEROBIC Blood Culture adequate volume   Culture   Final    NO GROWTH < 12 HOURS Performed at [facility], [address] [place], [place] [address]    Report Status PENDING  Incomplete  MRSA PCR Screening     Status: None   Collection Time: 07/24/20  9:30  PM   Specimen: Nasal Mucosa; Nasopharyngeal  Result Value Ref Range Status   MRSA by PCR NEGATIVE NEGATIVE Final    Comment:        The GeneXpert MRSA Assay (FDA approved for NASAL specimens only), is one component of a comprehensive MRSA colonization surveillance program. It is not intended to diagnose MRSA infection nor to guide or monitor treatment for MRSA infections. Performed at [facility], [address] [place], [place] [address]     Radiology Reports DG Chest [place] 1 View  Result Date: 07/24/2020 CLINICAL DATA:  COVID positive.  Body aches and fever.  Febrile EXAM: PORTABLE CHEST 1 VIEW COMPARISON:  03/13/2019 radiograph, CT 03/15/2019 FINDINGS: Normal cardiac silhouette. There is chronic nodular opacity in the RIGHT lower lobe similar to comparison CT and radiograph. LEFT lung is clear. No pneumothorax. IMPRESSION: Chronic bronchiectasis and nodularity in the RIGHT lower lobe consistent with chronic or recurrent pulmonary infection. Consider aspiration and fungal infections. Electronically Signed   By: Genevive Bi M.D.   On: 07/24/2020 17:01

## 2020-07-26 DIAGNOSIS — Z7289 Other problems related to lifestyle: Secondary | ICD-10-CM | POA: Diagnosis not present

## 2020-07-26 DIAGNOSIS — A4189 Other specified sepsis: Secondary | ICD-10-CM | POA: Diagnosis not present

## 2020-07-26 DIAGNOSIS — U071 COVID-19: Secondary | ICD-10-CM | POA: Diagnosis not present

## 2020-07-26 LAB — CBC WITH DIFFERENTIAL/PLATELET
Abs Immature Granulocytes: 0.08 K/uL — ABNORMAL HIGH (ref 0.00–0.07)
Basophils Absolute: 0 K/uL (ref 0.0–0.1)
Basophils Relative: 0 %
Eosinophils Absolute: 0.1 K/uL (ref 0.0–0.5)
Eosinophils Relative: 1 %
HCT: 34.8 % — ABNORMAL LOW (ref 39.0–52.0)
Hemoglobin: 11.6 g/dL — ABNORMAL LOW (ref 13.0–17.0)
Immature Granulocytes: 1 %
Lymphocytes Relative: 19 %
Lymphs Abs: 2.2 K/uL (ref 0.7–4.0)
MCH: 25.7 pg — ABNORMAL LOW (ref 26.0–34.0)
MCHC: 33.3 g/dL (ref 30.0–36.0)
MCV: 77 fL — ABNORMAL LOW (ref 80.0–100.0)
Monocytes Absolute: 0.5 K/uL (ref 0.1–1.0)
Monocytes Relative: 5 %
Neutro Abs: 8.5 K/uL — ABNORMAL HIGH (ref 1.7–7.7)
Neutrophils Relative %: 74 %
Platelets: 313 K/uL (ref 150–400)
RBC: 4.52 MIL/uL (ref 4.22–5.81)
RDW: 13.3 % (ref 11.5–15.5)
WBC: 11.4 K/uL — ABNORMAL HIGH (ref 4.0–10.5)
nRBC: 0 % (ref 0.0–0.2)

## 2020-07-26 LAB — T-HELPER CELLS (CD4) COUNT (NOT AT ARMC)
CD4 % Helper T Cell: 1 % — ABNORMAL LOW (ref 33–65)
CD4 T Cell Abs: <35 /uL — ABNORMAL LOW (ref 400–1790)

## 2020-07-26 LAB — MAGNESIUM: Magnesium: 1.9 mg/dL (ref 1.7–2.4)

## 2020-07-26 LAB — COMPREHENSIVE METABOLIC PANEL WITH GFR
ALT: 15 U/L (ref 0–44)
AST: 14 U/L — ABNORMAL LOW (ref 15–41)
Albumin: 2.3 g/dL — ABNORMAL LOW (ref 3.5–5.0)
Alkaline Phosphatase: 61 U/L (ref 38–126)
Anion gap: 8 (ref 5–15)
BUN: 9 mg/dL (ref 6–20)
CO2: 21 mmol/L — ABNORMAL LOW (ref 22–32)
Calcium: 8.2 mg/dL — ABNORMAL LOW (ref 8.9–10.3)
Chloride: 103 mmol/L (ref 98–111)
Creatinine, Ser: 0.65 mg/dL (ref 0.61–1.24)
GFR, Estimated: >60 mL/min
Glucose, Bld: 143 mg/dL — ABNORMAL HIGH (ref 70–99)
Potassium: 4 mmol/L (ref 3.5–5.1)
Sodium: 132 mmol/L — ABNORMAL LOW (ref 135–145)
Total Bilirubin: 0.6 mg/dL (ref 0.3–1.2)
Total Protein: 5 g/dL — ABNORMAL LOW (ref 6.5–8.1)

## 2020-07-26 LAB — C-REACTIVE PROTEIN: CRP: 7.8 mg/dL — ABNORMAL HIGH (ref <1.0)

## 2020-07-26 LAB — D-DIMER, QUANTITATIVE: D-Dimer, Quant: <0.27 ug/mL-FEU (ref 0.00–0.50)

## 2020-07-26 MED ORDER — GUAIFENESIN-DM 100-10 MG/5ML PO SYRP
5.0000 mL | ORAL_SOLUTION | ORAL | Status: DC | PRN
  Administered 2020-07-26 – 2020-07-28 (×3): 5 mL via ORAL
  Filled 2020-07-26 (×2): qty 5

## 2020-07-26 MED ORDER — AZITHROMYCIN 500 MG PO TABS
500.0000 mg | ORAL_TABLET | Freq: Every day | ORAL | Status: AC
  Administered 2020-07-26 – 2020-07-28 (×3): 500 mg via ORAL
  Filled 2020-07-26 (×3): qty 1

## 2020-07-26 MED ORDER — MAGNESIUM SULFATE 2 GM/50ML IV SOLN
2.0000 g | Freq: Once | INTRAVENOUS | Status: AC
  Administered 2020-07-26: 2 g via INTRAVENOUS
  Filled 2020-07-26: qty 50

## 2020-07-26 NOTE — Progress Notes (Signed)
PROGRESS NOTE                                                                                                                                                                                                             Patient Demographics:    [facility], is a 45 y.o. male, DOB - [birth year], KJZ:[id number]  Outpatient Primary MD for the patient is Patient, No Pcp Per   Admit date - 07/24/2020   LOS - 2  Chief Complaint  Patient presents with  . Fever  . Generalized Body Aches       Brief Narrative: Patient is a 45 y.o. male with PMHx of HIV-noncompliant with ART-presented to the hospital with 1 week history of cough/fevers and myalgias.  Found to have COVID-19 infection and subsequently admitted to the hospitalist service.  See below for further details  COVID-19 vaccinated status: Unvaccinated  Significant Events: 2/15>> Admit to [facility] for possible COVID-19 pneumonia  Significant studies: 2/15>>Chest x-ray: Chronic bronchiectasis/nodularity in the right LLL 2/17>>CD4 <35  COVID-19 medications: Steroids: 2/15>> Remdesivir: 2/15>>  Antibiotics: Rocephin: 2/15>> Zithromax: 2/15>>  Microbiology data: 2/15 >>blood culture: No growth  Procedures: None  Consults: None  DVT prophylaxis: enoxaparin (LOVENOX) injection 40 mg Start: 07/24/20 1845    Subjective:   Feels better-continues to cough-not on oxygen.   Assessment  & Plan :   COVID-19 infection-possible RLL bacterial pneumonia: Improving-severely immunocompromised-due to noncompliance with antiretrovirals-plan is to continue steroid/Remdesivir/Rocephin/Zithromax.  Reassess in the next 1-2 days  Fever: afebrile O2 requirements:  SpO2: 97 %   COVID-19 Labs: Recent Labs    07/25/20 0129 07/26/20 0027  DDIMER 0.29 <0.27  FERRITIN 501*  --   CRP 11.4* 7.8*    No results found for: BNP  No results for input(s): PROCALCITON in  the last 168 hours.  Lab Results  Component Value Date   SARSCOV2NAA POSITIVE (A) 07/24/2020   SARSCOV2NAA NEGATIVE 03/13/2019   SARSCOV2NAA NEGATIVE 12/05/2018     Prone/Incentive Spirometry: encouraged incentive spirometry use 3-4/hour.  HIV/AIDS: Noncompliant with antiretrovirals-on Bactrim/Zithromax for prophylaxis.  Await-CD4 count/viral load.  History of alcohol: Watch for withdrawal symptoms  GI prophylaxis: PP  ABG:    Component Value Date/Time   PHART 7.455 (H) 03/16/2015 0133   PCO2ART 30.0 (L) 03/16/2015 0133   PO2ART 80.0 03/16/2015 0133   HCO3  21.1 03/16/2015 0133   TCO2 22 03/16/2015 0133   ACIDBASEDEF 2.0 03/16/2015 0133   O2SAT 97.0 03/16/2015 0133    Vent Settings: N/A   Condition -Stable  Family Communication  : Patient will update family himself.  Code Status :  Full Code  Diet :  Diet Order            Diet regular Room service appropriate? Yes; Fluid consistency: Thin  Diet effective now                  Disposition Plan  :   Status is: Inpatient  Remains inpatient appropriate because:Inpatient level of care appropriate due to severity of illness   Dispo: The patient is from: Home              Anticipated d/c is to: Home              Anticipated d/c date is: 3 days              Patient currently is not medically stable to d/c.   Difficult to place patient No   Barriers to discharge: complete 5 days of IV Remdesivir  Antimicorbials  :    Anti-infectives (From admission, onward)   Start     Dose/Rate Route Frequency Ordered Stop   08/04/20 1000  azithromycin (ZITHROMAX) tablet 1,200 mg        1,200 mg Oral Weekly 07/25/20 1049     07/26/20 1800  azithromycin (ZITHROMAX) tablet 500 mg        500 mg Oral Daily-1800 07/26/20 1107 07/29/20 1759   07/25/20 1200  sulfamethoxazole-trimethoprim (BACTRIM) 400-80 MG per tablet 1 tablet        1 tablet Oral Daily 07/25/20 1047     07/25/20 1000  remdesivir 100 mg in sodium chloride 0.9 %  100 mL IVPB       "Followed by" Linked Group Details   100 mg 200 mL/hr over 30 Minutes Intravenous Daily 07/24/20 1841 07/29/20 0959   07/24/20 1845  remdesivir 200 mg in sodium chloride 0.9% 250 mL IVPB       "Followed by" Linked Group Details   200 mg 580 mL/hr over 30 Minutes Intravenous Once 07/24/20 1841 07/24/20 2203   07/24/20 1845  azithromycin (ZITHROMAX) 500 mg in sodium chloride 0.9 % 250 mL IVPB  Status:  Discontinued        500 mg 250 mL/hr over 60 Minutes Intravenous Every 24 hours 07/24/20 1841 07/26/20 1107   07/24/20 1845  cefTRIAXone (ROCEPHIN) 1 g in sodium chloride 0.9 % 100 mL IVPB  Status:  Discontinued        1 g 200 mL/hr over 30 Minutes Intravenous Every 24 hours 07/24/20 1841 07/24/20 1845   07/24/20 1645  cefTRIAXone (ROCEPHIN) 2 g in sodium chloride 0.9 % 100 mL IVPB        2 g 200 mL/hr over 30 Minutes Intravenous Every 24 hours 07/24/20 1644 07/28/20 2359   07/24/20 1645  azithromycin (ZITHROMAX) tablet 500 mg  Status:  Discontinued        500 mg Oral Daily 07/24/20 1644 07/24/20 1846   07/24/20 1630  levofloxacin (LEVAQUIN) IVPB 750 mg  Status:  Discontinued        750 mg 100 mL/hr over 90 Minutes Intravenous  Once 07/24/20 1619 07/24/20 1642      Inpatient Medications  Scheduled Meds: . vitamin C  500 mg Oral Daily  . [START ON 08/04/2020] azithromycin  1,200 mg Oral Weekly  . azithromycin  500 mg Oral q1800  . enoxaparin (LOVENOX) injection  40 mg Subcutaneous Q24H  . methylPREDNISolone (SOLU-MEDROL) injection  40 mg Intravenous Q12H  . pantoprazole  40 mg Oral Q1200  . sulfamethoxazole-trimethoprim  1 tablet Oral Daily  . zinc sulfate  220 mg Oral Daily   Continuous Infusions: . sodium chloride 100 mL/hr at 07/25/20 2348  . cefTRIAXone (ROCEPHIN)  IV 2 g (07/25/20 1733)  . remdesivir 100 mg in NS 100 mL 100 mg (07/25/20 0937)   PRN Meds:.acetaminophen, ondansetron **OR** ondansetron (ZOFRAN) IV   Time Spent in minutes  25  See all Orders  from today for further details   Jeoffrey MassedShanker Cecilee Rosner M.D on 07/26/2020 at 11:59 AM  To page go to [email or web address] - use universal password  [facility] -  Office  [phone]    Objective:   Vitals:   07/25/20 2337 07/25/20 2354 07/26/20 0543 07/26/20 0727  BP: (!) 85/45 (!) 100/55 (!) 92/52 (!) 94/58  Pulse: 68 (!) 59 (!) 59 79  Resp: 18 20 16 16   Temp: (!) 97.5 F (36.4 C) 98.2 F (36.8 C) 98 F (36.7 C) 98.2 F (36.8 C)  TempSrc: Oral Oral Oral Oral  SpO2: 96% 94% 94% 97%  Weight:      Height:        Wt Readings from Last 3 Encounters:  07/24/20 64.3 kg  04/25/19 63 kg  03/30/19 62.3 kg     Intake/Output Summary (Last 24 hours) at 07/26/2020 1159 Last data filed at 07/26/2020 [id number] Gross per 24 hour  Intake 2147.85 ml  Output 901 ml  Net 1246.85 ml     Physical Exam Gen Exam:Alert awake-not in any distress HEENT:atraumatic, normocephalic Chest: B/L clear to auscultation anteriorly CVS:S1S2 regular Abdomen:soft non tender, non distended Extremities:no edema Neurology: Non focal Skin: no rash   Data Review:    CBC Recent Labs  Lab 07/24/20 1435 07/25/20 0129 07/26/20 0027  WBC 15.6* 13.6* 11.4*  HGB 14.5 11.5* 11.6*  HCT 44.1 33.0* 34.8*  PLT 436* 308 313  MCV 76.2* 75.7* 77.0*  MCH 25.0* 26.4 25.7*  MCHC 32.9 34.8 33.3  RDW 13.3 13.4 13.3  LYMPHSABS  --  1.1 2.2  MONOABS  --  0.3 0.5  EOSABS  --  0.0 0.1  BASOSABS  --  0.0 0.0    Chemistries  Recent Labs  Lab 07/24/20 1435 07/24/20 1618 07/25/20 0129 07/26/20 0027  NA 130*  --  133* 132*  K 4.0  --  3.5 4.0  CL 96*  --  101 103  CO2 21*  --  21* 21*  GLUCOSE 182*  --  162* 143*  BUN 9  --  7 9  CREATININE 0.86  --  0.63 0.65  CALCIUM 8.8*  --  8.2* 8.2*  MG  --   --  1.6* 1.9  AST  --  25 19 14*  ALT  --  19 17 15   ALKPHOS  --  89 80 61  BILITOT  --  1.8* 0.8 0.6    ------------------------------------------------------------------------------------------------------------------ No results for input(s): CHOL, HDL, LDLCALC, TRIG, CHOLHDL, LDLDIRECT in the last 72 hours.  Lab Results  Component Value Date   HGBA1C 6.3 (H) 03/16/2015   ------------------------------------------------------------------------------------------------------------------ No results for input(s): TSH, T4TOTAL, T3FREE, THYROIDAB in the last 72 hours.  Invalid input(s): FREET3 ------------------------------------------------------------------------------------------------------------------ Recent Labs    07/25/20 0129  FERRITIN 501*    Coagulation profile Recent Labs  Lab 07/24/20 1618  INR 1.2    Recent Labs    07/25/20 0129 07/26/20 0027  DDIMER 0.29 <0.27    Cardiac Enzymes No results for input(s): CKMB, TROPONINI, MYOGLOBIN in the last 168 hours.  Invalid input(s): CK ------------------------------------------------------------------------------------------------------------------ No results found for: BNP  Micro Results Recent Results (from the past 240 hour(s))  Resp Panel by RT-PCR (Flu A&B, Covid) Nasopharyngeal Swab     Status: Abnormal   Collection Time: 07/24/20  2:18 PM   Specimen: Nasopharyngeal Swab; Nasopharyngeal(NP) swabs in vial transport medium  Result Value Ref Range Status   SARS Coronavirus 2 by RT PCR POSITIVE (A) NEGATIVE Final    Comment: RESULT CALLED TO, READ BACK BY AND VERIFIED WITH: RN DSP [id number] FCP (NOTE) SARS-CoV-2 target nucleic acids are DETECTED.  The SARS-CoV-2 RNA is generally detectable in upper respiratory specimens during the acute phase of infection. Positive results are indicative of the presence of the identified virus, but do not rule out bacterial infection or co-infection with other pathogens not detected by the test. Clinical correlation with patient history and other diagnostic information is  necessary to determine patient infection status. The expected result is Negative.  Fact Sheet for Patients: BloggerCourse.com  Fact Sheet for Healthcare Providers: SeriousBroker.it  This test is not yet approved or cleared by the [place] FDA and  has been authorized for detection and/or diagnosis of SARS-CoV-2 by FDA under an Emergency Use Authorization (EUA).  This EUA will remain in effect (meaning this test can be used) for  the duration of  the COVID-19 declaration under Section 564(b)(1) of the Act, 21 U.S.C. section 360bbb-3(b)(1), unless the authorization is terminated or revoked sooner.     Influenza A by PCR NEGATIVE NEGATIVE Final   Influenza B by PCR NEGATIVE NEGATIVE Final    Comment: (NOTE) The Xpert Xpress SARS-CoV-2/FLU/RSV plus assay is intended as an aid in the diagnosis of influenza from Nasopharyngeal swab specimens and should not be used as a sole basis for treatment. Nasal washings and aspirates are unacceptable for Xpert Xpress SARS-CoV-2/FLU/RSV testing.  Fact Sheet for Patients: BloggerCourse.com  Fact Sheet for Healthcare Providers: SeriousBroker.it  This test is not yet approved or cleared by the [place] FDA and has been authorized for detection and/or diagnosis of SARS-CoV-2 by FDA under an Emergency Use Authorization (EUA). This EUA will remain in effect (meaning this test can be used) for the duration of the COVID-19 declaration under Section 564(b)(1) of the Act, 21 U.S.C. section 360bbb-3(b)(1), unless the authorization is terminated or revoked.  Performed at [facility], [address] [place], [place] [address]   Blood Culture (routine x 2)     Status: None (Preliminary result)   Collection Time: 07/24/20  4:30 PM   Specimen: BLOOD  Result Value Ref Range Status   Specimen Description BLOOD SITE NOT SPECIFIED  Final    Special Requests   Final    BOTTLES DRAWN AEROBIC AND ANAEROBIC Blood Culture adequate volume   Culture   Final    NO GROWTH 2 DAYS Performed at [facility], [address] [place], [place] [address]    Report Status PENDING  Incomplete  Blood Culture (routine x 2)     Status: None (Preliminary result)   Collection Time: 07/24/20  4:45 PM   Specimen: BLOOD  Result Value Ref Range Status   Specimen Description BLOOD SITE NOT SPECIFIED  Final   Special Requests   Final  BOTTLES DRAWN AEROBIC AND ANAEROBIC Blood Culture adequate volume   Culture   Final    NO GROWTH 2 DAYS Performed at [facility], [address], [place], [place] [address]    Report Status PENDING  Incomplete  MRSA PCR Screening     Status: None   Collection Time: 07/24/20  9:30 PM   Specimen: Nasal Mucosa; Nasopharyngeal  Result Value Ref Range Status   MRSA by PCR NEGATIVE NEGATIVE Final    Comment:        The GeneXpert MRSA Assay (FDA approved for NASAL specimens only), is one component of a comprehensive MRSA colonization surveillance program. It is not intended to diagnose MRSA infection nor to guide or monitor treatment for MRSA infections. Performed at [facility], [address] [place], [place] [address]     Radiology Reports DG Chest [place] 1 View  Result Date: 07/24/2020 CLINICAL DATA:  COVID positive.  Body aches and fever.  Febrile EXAM: PORTABLE CHEST 1 VIEW COMPARISON:  03/13/2019 radiograph, CT 03/15/2019 FINDINGS: Normal cardiac silhouette. There is chronic nodular opacity in the RIGHT lower lobe similar to comparison CT and radiograph. LEFT lung is clear. No pneumothorax. IMPRESSION: Chronic bronchiectasis and nodularity in the RIGHT lower lobe consistent with chronic or recurrent pulmonary infection. Consider aspiration and fungal infections. Electronically Signed   By: Genevive Bi M.D.   On: 07/24/2020 17:01

## 2020-07-27 DIAGNOSIS — J1282 Pneumonia due to coronavirus disease 2019: Secondary | ICD-10-CM

## 2020-07-27 DIAGNOSIS — R519 Headache, unspecified: Secondary | ICD-10-CM

## 2020-07-27 DIAGNOSIS — A4189 Other specified sepsis: Secondary | ICD-10-CM | POA: Diagnosis not present

## 2020-07-27 DIAGNOSIS — U071 COVID-19: Secondary | ICD-10-CM | POA: Diagnosis not present

## 2020-07-27 DIAGNOSIS — B20 Human immunodeficiency virus [HIV] disease: Secondary | ICD-10-CM

## 2020-07-27 DIAGNOSIS — B379 Candidiasis, unspecified: Secondary | ICD-10-CM

## 2020-07-27 DIAGNOSIS — Z7289 Other problems related to lifestyle: Secondary | ICD-10-CM | POA: Diagnosis not present

## 2020-07-27 LAB — CBC WITH DIFFERENTIAL/PLATELET
Abs Immature Granulocytes: 0.08 10*3/uL — ABNORMAL HIGH (ref 0.00–0.07)
Basophils Absolute: 0 10*3/uL (ref 0.0–0.1)
Basophils Relative: 0 %
Eosinophils Absolute: 1 10*3/uL — ABNORMAL HIGH (ref 0.0–0.5)
Eosinophils Relative: 10 %
HCT: 38.4 % — ABNORMAL LOW (ref 39.0–52.0)
Hemoglobin: 12.6 g/dL — ABNORMAL LOW (ref 13.0–17.0)
Immature Granulocytes: 1 %
Lymphocytes Relative: 18 %
Lymphs Abs: 1.8 10*3/uL (ref 0.7–4.0)
MCH: 25.5 pg — ABNORMAL LOW (ref 26.0–34.0)
MCHC: 32.8 g/dL (ref 30.0–36.0)
MCV: 77.7 fL — ABNORMAL LOW (ref 80.0–100.0)
Monocytes Absolute: 0.9 10*3/uL (ref 0.1–1.0)
Monocytes Relative: 9 %
Neutro Abs: 6.1 10*3/uL (ref 1.7–7.7)
Neutrophils Relative %: 62 %
Platelets: 308 10*3/uL (ref 150–400)
RBC: 4.94 MIL/uL (ref 4.22–5.81)
RDW: 13.2 % (ref 11.5–15.5)
WBC: 9.9 10*3/uL (ref 4.0–10.5)
nRBC: 0 % (ref 0.0–0.2)

## 2020-07-27 LAB — COMPREHENSIVE METABOLIC PANEL WITH GFR
ALT: 27 U/L (ref 0–44)
AST: 31 U/L (ref 15–41)
Albumin: 2.6 g/dL — ABNORMAL LOW (ref 3.5–5.0)
Alkaline Phosphatase: 67 U/L (ref 38–126)
Anion gap: 9 (ref 5–15)
BUN: 12 mg/dL (ref 6–20)
CO2: 25 mmol/L (ref 22–32)
Calcium: 8.4 mg/dL — ABNORMAL LOW (ref 8.9–10.3)
Chloride: 98 mmol/L (ref 98–111)
Creatinine, Ser: 0.73 mg/dL (ref 0.61–1.24)
GFR, Estimated: >60 mL/min
Glucose, Bld: 99 mg/dL (ref 70–99)
Potassium: 3.8 mmol/L (ref 3.5–5.1)
Sodium: 132 mmol/L — ABNORMAL LOW (ref 135–145)
Total Bilirubin: 0.8 mg/dL (ref 0.3–1.2)
Total Protein: 5.6 g/dL — ABNORMAL LOW (ref 6.5–8.1)

## 2020-07-27 LAB — HIV-1 RNA QUANT-NO REFLEX-BLD
HIV 1 RNA Quant: 5130 {copies}/mL
LOG10 HIV-1 RNA: 3.71 {Log_copies}/mL

## 2020-07-27 LAB — CRYPTOCOCCAL ANTIGEN: Crypto Ag: NEGATIVE

## 2020-07-27 LAB — C-REACTIVE PROTEIN: CRP: 3 mg/dL — ABNORMAL HIGH

## 2020-07-27 LAB — D-DIMER, QUANTITATIVE: D-Dimer, Quant: <0.27 ug{FEU}/mL (ref 0.00–0.50)

## 2020-07-27 LAB — MAGNESIUM: Magnesium: 1.9 mg/dL (ref 1.7–2.4)

## 2020-07-27 MED ORDER — FLUCONAZOLE 100 MG PO TABS: 200.0000 mg | ORAL_TABLET | Freq: Every day | ORAL | Status: DC

## 2020-07-27 MED ORDER — LIDOCAINE HCL 1 % IJ SOLN
5.0000 mL | Freq: Once | INTRAMUSCULAR | Status: DC
  Filled 2020-07-27: qty 5

## 2020-07-27 MED ORDER — FLUCONAZOLE 100 MG PO TABS
200.0000 mg | ORAL_TABLET | Freq: Every day | ORAL | Status: DC
  Administered 2020-07-27 – 2020-07-30 (×4): 200 mg via ORAL
  Filled 2020-07-27 (×4): qty 2

## 2020-07-27 MED ORDER — LIDOCAINE HCL 1 % IJ SOLN
20.0000 mL | Freq: Once | INTRAMUSCULAR | Status: AC
  Administered 2020-07-27: 20 mL
  Filled 2020-07-27: qty 20

## 2020-07-27 MED ORDER — BICTEGRAVIR-EMTRICITAB-TENOFOV 50-200-25 MG PO TABS
1.0000 | ORAL_TABLET | Freq: Every day | ORAL | Status: DC
  Filled 2020-07-27: qty 1

## 2020-07-27 MED ORDER — PREDNISONE 20 MG PO TABS
40.0000 mg | ORAL_TABLET | Freq: Every day | ORAL | Status: DC
  Administered 2020-07-28: 40 mg via ORAL
  Filled 2020-07-27: qty 2

## 2020-07-27 NOTE — Consult Note (Addendum)
[facility]  Total days of antibiotics 4/remdesivir/azitrho/ceftriaxone         Reason for Consult:sepsis due to covid-19/advanced hiv disease   Referring Physician: ghimire  Principal Problem:   Sepsis due to COVID-19 [facility](HCC) Active Problems:   Protein-calorie malnutrition, severe (HCC)   Gastroesophageal reflux disease without esophagitis   AIDS (acquired immune deficiency syndrome) (HCC)   Alcohol use   Multifocal pneumonia    H[facility] is a 45 y.o. 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 Last seen roughly 1 year ago. He was admitted for covid-19 sepsis with worsening shortness of breath, fever, malaise, and diarrhea. He was also started on azithromycin and ceftriaxone for pneumonia. He was kept of bactrim for oi prophylaxis. He reports frontal headache but tolerates light, worse with certain positions  Past Medical History:  Diagnosis Date  . HIV disease (HCC)   . Known health problems: none     Allergies:  Allergies  Allergen Reactions  . Penicillins Hives and Itching    Has patient had a PCN reaction causing immediate rash, facial/tongue/throat swelling, SOB or lightheadedness with hypotension: Yes Has patient had a PCN reaction causing severe rash involving mucus membranes or skin necrosis: No Has patient had a PCN reaction that required hospitalization No Has patient had a PCN reaction occurring within the last 10 years: No If all of the above answers are "NO", then may proceed with Cephalosporin use.    MEDICATIONS: . vitamin C  500 mg Oral Daily  . [START ON 08/04/2020] azithromycin  1,200 mg Oral Weekly  . azithromycin  500 mg Oral q1800  . pantoprazole  40 mg Oral Q1200  . [START ON 07/28/2020] predniSONE  40 mg Oral Q breakfast  . sulfamethoxazole-trimethoprim  1 tablet Oral Daily  . zinc sulfate  220 mg Oral Daily    Social History   Tobacco Use  . Smoking status: Never  Smoker  . Smokeless tobacco: Never Used  Vaping Use  . Vaping Use: Never used  Substance Use Topics  . Alcohol use: Yes    Alcohol/week: 1.0 standard drink    Types: 1 Cans of beer per week    Comment: socially  . Drug use: Not Currently    History reviewed. No pertinent family history.   Review of Systems  Constitutional: positive for fever, chills, diaphoresis, activity change, appetite change, fatigue and unexpected weight change.  HENT: Negative for congestion, sore throat, rhinorrhea, sneezing, trouble swallowing and sinus pressure.  Eyes: Negative for photophobia and visual disturbance.  Respiratory: positive for cough, chest tightness, shortness of breath, wheezing and stridor.  Cardiovascular: Negative for chest pain, palpitations and leg swelling.  Gastrointestinal: positive for nausea, vomiting, abdominal pain, diarrhea, constipation, blood in stool, abdominal distention and anal bleeding.  Genitourinary: Negative for dysuria, hematuria, flank pain and difficulty urinating.  Musculoskeletal: Negative for myalgias, back pain, joint swelling, arthralgias and gait problem.  Skin: Negative for color change, pallor, rash and wound.  Neurological: Negative for dizziness, tremors, weakness and light-headedness.  Hematological: Negative for adenopathy. Does not bruise/bleed easily.  Psychiatric/Behavioral: Negative for behavioral problems, confusion, sleep disturbance, dysphoric mood, decreased concentration and agitation.     OBJECTIVE: Temp:  [97.5 F (36.4 C)-98.2 F (36.8 C)] 97.7 F (36.5 C) (02/18 1142) Pulse Rate:  [69-84] 76 (02/18 1142) Resp:  [18-20] 20 (02/18 1142) BP: (90-100)/(56-73) 92/56 (02/18 1142) SpO2:  [93 %-97 %] 93 % (02/18 1142)  Physical Exam  Constitutional: He is oriented to person, place, and time. He appears well-developed and well-nourished. No distress.  HENT:  Mouth/Throat: Oropharynx is clear and moist. No oropharyngeal exudate.   Cardiovascular: Normal rate, regular rhythm and normal heart sounds. Exam reveals no gallop and no friction rub.  No murmur heard.  Pulmonary/Chest: Effort normal and breath sounds normal. No respiratory distress. He has no wheezes.  Abdominal: Soft. Bowel sounds are normal. He exhibits no distension. There is no tenderness.  Lymphadenopathy:  He has no cervical adenopathy.  Neurological: He is alert and oriented to person, place, and time.  Skin: Skin is warm and dry. No rash noted. No erythema.  Psychiatric: He has a normal mood and affect. His behavior is normal.     LABS: Results for orders placed or performed during the hospital encounter of 07/24/20 (from the past 48 hour(s))  CBC with Differential/Platelet     Status: Abnormal   Collection Time: 07/26/20 12:27 AM  Result Value Ref Range   WBC 11.4 (H) 4.0 - 10.5 K/uL   RBC 4.52 4.22 - 5.81 MIL/uL   Hemoglobin 11.6 (L) 13.0 - 17.0 g/dL   HCT 63.8 (L) 46.6 - 59.9 %   MCV 77.0 (L) 80.0 - 100.0 fL   MCH 25.7 (L) 26.0 - 34.0 pg   MCHC 33.3 30.0 - 36.0 g/dL   RDW 35.7 01.7 - 79.3 %   Platelets 313 150 - 400 K/uL   nRBC 0.0 0.0 - 0.2 %   Neutrophils Relative % 74 %   Neutro Abs 8.5 (H) 1.7 - 7.7 K/uL   Lymphocytes Relative 19 %   Lymphs Abs 2.2 0.7 - 4.0 K/uL   Monocytes Relative 5 %   Monocytes Absolute 0.5 0.1 - 1.0 K/uL   Eosinophils Relative 1 %   Eosinophils Absolute 0.1 0.0 - 0.5 K/uL   Basophils Relative 0 %   Basophils Absolute 0.0 0.0 - 0.1 K/uL   Immature Granulocytes 1 %   Abs Immature Granulocytes 0.08 (H) 0.00 - 0.07 K/uL    Comment: Performed at [facility], [address] [place], [place] [address]  Comprehensive metabolic panel     Status: Abnormal   Collection Time: 07/26/20 12:27 AM  Result Value Ref Range   Sodium 132 (L) 135 - 145 mmol/L   Potassium 4.0 3.5 - 5.1 mmol/L   Chloride 103 98 - 111 mmol/L   CO2 21 (L) 22 - 32 mmol/L   Glucose, Bld 143 (H) 70 - 99 mg/dL    Comment: Glucose  reference range applies only to samples taken after fasting for at least 8 hours.   BUN 9 6 - 20 mg/dL   Creatinine, Ser 9.23 0.61 - 1.24 mg/dL   Calcium 8.2 (L) 8.9 - 10.3 mg/dL   Total Protein 5.0 (L) 6.5 - 8.1 g/dL   Albumin 2.3 (L) 3.5 - 5.0 g/dL   AST 14 (L) 15 - 41 U/L   ALT 15 0 - 44 U/L   Alkaline Phosphatase 61 38 - 126 U/L   Total Bilirubin 0.6 0.3 - 1.2 mg/dL   GFR, Estimated >30 >07 mL/min    Comment: (NOTE) Calculated using the CKD-EPI Creatinine Equation (2021)    Anion gap 8 5 - 15    Comment: Performed at [facility], [address], [place], [place] [address]  C-reactive protein     Status: Abnormal   Collection Time: 07/26/20 12:27 AM  Result Value Ref  Range   CRP 7.8 (H) <1.0 mg/dL    Comment: Performed at [facility], [address], [place], [place] [address]  D-dimer, quantitative (not at [facility])     Status: None   Collection Time: 07/26/20 12:27 AM  Result Value Ref Range   D-Dimer, Quant <0.27 0.00 - 0.50 ug/mL-FEU    Comment: (NOTE) At the manufacturer cut-off value of 0.5 g/mL FEU, this assay has a negative predictive value of 95-100%.This assay is intended for use in conjunction with a clinical pretest probability (PTP) assessment model to exclude pulmonary embolism (PE) and deep venous thrombosis (DVT) in outpatients suspected of PE or DVT. Results should be correlated with clinical presentation. Performed at [facility], [address] [place], [place] [address]   Magnesium     Status: None   Collection Time: 07/26/20 12:27 AM  Result Value Ref Range   Magnesium 1.9 1.7 - 2.4 mg/dL    Comment: Performed at [facility], [address], [place], [place] [address]  T-helper cells (CD4) count (not at [facility])     Status: Abnormal   Collection Time: 07/26/20 12:27 AM  Result Value Ref Range   CD4 T Cell Abs <35 (L) 400 - 1,790 /uL   CD4 % Helper T Cell 1 (L) 33 - 65 %    Comment: Performed at [facility],  [address] [place], [place] [address]  HIV-1 RNA quant-no reflex-bld     Status: None   Collection Time: 07/26/20 12:27 AM  Result Value Ref Range   HIV 1 RNA Quant 5,130 copies/mL    Comment: (NOTE) The reportable range for this assay is 20 to 10,000,000 copies HIV-1 RNA/mL.    LOG10 HIV-1 RNA 3.710 log10copy/mL    Comment: (NOTE) Performed At: [facility] [address] [place], [place] [id number] Jolene Schimke MD MW:[id number]   CBC with Differential/Platelet     Status: Abnormal   Collection Time: 07/27/20  1:14 AM  Result Value Ref Range   WBC 9.9 4.0 - 10.5 K/uL   RBC 4.94 4.22 - 5.81 MIL/uL   Hemoglobin 12.6 (L) 13.0 - 17.0 g/dL   HCT 40.3 (L) 47.4 - 25.9 %   MCV 77.7 (L) 80.0 - 100.0 fL   MCH 25.5 (L) 26.0 - 34.0 pg   MCHC 32.8 30.0 - 36.0 g/dL   RDW 56.3 87.5 - 64.3 %   Platelets 308 150 - 400 K/uL   nRBC 0.0 0.0 - 0.2 %   Neutrophils Relative % 62 %   Neutro Abs 6.1 1.7 - 7.7 K/uL   Lymphocytes Relative 18 %   Lymphs Abs 1.8 0.7 - 4.0 K/uL   Monocytes Relative 9 %   Monocytes Absolute 0.9 0.1 - 1.0 K/uL   Eosinophils Relative 10 %   Eosinophils Absolute 1.0 (H) 0.0 - 0.5 K/uL   Basophils Relative 0 %   Basophils Absolute 0.0 0.0 - 0.1 K/uL   Immature Granulocytes 1 %   Abs Immature Granulocytes 0.08 (H) 0.00 - 0.07 K/uL    Comment: Performed at [facility], [address], [place], [place] [address]  Comprehensive metabolic panel     Status: Abnormal   Collection Time: 07/27/20  1:14 AM  Result Value Ref Range   Sodium 132 (L) 135 - 145 mmol/L   Potassium 3.8 3.5 - 5.1 mmol/L   Chloride 98 98 - 111 mmol/L   CO2 25 22 - 32 mmol/L  Glucose, Bld 99 70 - 99 mg/dL    Comment: Glucose reference range applies only to samples taken after fasting for at least 8 hours.   BUN 12 6 - 20 mg/dL   Creatinine, Ser 9.38 0.61 - 1.24 mg/dL   Calcium 8.4 (L) 8.9 - 10.3 mg/dL   Total Protein 5.6 (L) 6.5 - 8.1 g/dL   Albumin 2.6 (L) 3.5 - 5.0 g/dL    AST 31 15 - 41 U/L   ALT 27 0 - 44 U/L   Alkaline Phosphatase 67 38 - 126 U/L   Total Bilirubin 0.8 0.3 - 1.2 mg/dL   GFR, Estimated >10 >17 mL/min    Comment: (NOTE) Calculated using the CKD-EPI Creatinine Equation (2021)    Anion gap 9 5 - 15    Comment: Performed at [facility], [address] [place], [place] [address]  C-reactive protein     Status: Abnormal   Collection Time: 07/27/20  1:14 AM  Result Value Ref Range   CRP 3.0 (H) <1.0 mg/dL    Comment: Performed at [facility], [address] [place], [place] [address]  D-dimer, quantitative (not at [facility])     Status: None   Collection Time: 07/27/20  1:14 AM  Result Value Ref Range   D-Dimer, Quant <0.27 0.00 - 0.50 ug/mL-FEU    Comment: (NOTE) At the manufacturer cut-off value of 0.5 g/mL FEU, this assay has a negative predictive value of 95-100%.This assay is intended for use in conjunction with a clinical pretest probability (PTP) assessment model to exclude pulmonary embolism (PE) and deep venous thrombosis (DVT) in outpatients suspected of PE or DVT. Results should be correlated with clinical presentation. Performed at [facility], [address] [place], [place] [address]   Magnesium     Status: None   Collection Time: 07/27/20  1:14 AM  Result Value Ref Range   Magnesium 1.9 1.7 - 2.4 mg/dL    Comment: Performed at [facility], [address] [place], [place] [address]    MICRO: reviewed IMAGING: No results found.   Assessment/Plan:  45yo M with advanced hiv disease, poorly controlled. Admitted for sepsis due to covid-19 concern for other HIV related opportunistic infections  covid-19 pneumonia = continue on remdesivir x 5 days total  Secondary pneumonia = finish off 5 days of ceftriaxone plus azithromycin  OI proph = will give azithromycin 1200mg  weekly after finishing pneumonia treatment, continue with bactrim daily  hiv disease= will get genotype and start back on  biktarvy after we rule out cryptococcal meningitis  Thrush = will recommend to do 7 day course of fluconazole 200mg  daily  Headache = would recommend to check serum crypto antigen as well as do LP to check for RPR, CSFcryptococcal antigen and cx

## 2020-07-27 NOTE — Progress Notes (Addendum)
PROGRESS NOTE                                                                                                                                                                                                             Patient Demographics:    [facility], is a 45 y.o. male, DOB - Nov 07, 1975, JKK:[id number]  Outpatient Primary MD for the patient is Patient, No Pcp Per   Admit date - 07/24/2020   LOS - 3  Chief Complaint  Patient presents with  . Fever  . Generalized Body Aches       Brief Narrative: Patient is a 45 y.o. male with PMHx of HIV-noncompliant with ART-presented to the hospital with 1 week history of cough/fevers and myalgias.  Found to have COVID-19 infection and subsequently admitted to the hospitalist service.  See below for further details  COVID-19 vaccinated status: Unvaccinated  Significant Events: 2/15>> Admit to [facility] for possible COVID-19 pneumonia  Significant studies: 2/15>>Chest x-ray: Chronic bronchiectasis/nodularity in the right LLL 2/17>>CD4 <35  COVID-19 medications: Steroids: 2/15>> Remdesivir: 2/15>>  Antibiotics: Rocephin: 2/15>> Zithromax: 2/15>>  Microbiology data: 2/15 >>blood culture: No growth  Procedures: None  Consults: None  DVT prophylaxis: SCDs for now    Subjective:   Overall much improved and admission-has had headaches for the past few days-spoke with patient via iPad translator today apparently has had early-morning headaches for almost a month now.  Worse with coughing.   Assessment  & Plan :   COVID-19 infection-possible RLL bacterial pneumonia: On room air-afebrile-still coughing-severely immunocompromise due to noncompliance with antiretrovirals-Taper steroids rapidly over the next few days-plan is to complete 5 days of Remdesivir/Rocephin and Zithromax.    Fever: afebrile O2 requirements:  SpO2: 93 %   COVID-19 Labs: Recent Labs     07/25/20 0129 07/26/20 0027 07/27/20 0114  DDIMER 0.29 <0.27 <0.27  FERRITIN 501*  --   --   CRP 11.4* 7.8* 3.0*    No results found for: BNP  No results for input(s): PROCALCITON in the last 168 hours.  Lab Results  Component Value Date   SARSCOV2NAA POSITIVE (A) 07/24/2020   SARSCOV2NAA NEGATIVE 03/13/2019   SARSCOV2NAA NEGATIVE 12/05/2018     Prone/Incentive Spirometry: encouraged incentive spirometry use 3-4/hour.  Headache: Initially attributed to acute illness/fever/Covid infection-however continues to have headaches almost on a daily  basis-especially worse in the morning.  Today claims that this has been ongoing for a month.  Neck is supple-he is awake and alert.  He has advanced HIV/AIDS-CD4 count is <34-we will touch base with infectious disease-if further work-up including LP is indicated in this situation.  HIV/AIDS: Noncompliant with antiretrovirals-on Bactrim/Zithromax for prophylaxis.  CD4 count is extremely low-claims that ART that he was on caused him to have dizziness upon standing-and he could not work-hence he was noncompliant.  He is interested in restarting ART's-ID consult pending.  History of alcohol: No withdrawal symptoms  GI prophylaxis: PP  ABG:    Component Value Date/Time   PHART 7.455 (H) 03/16/2015 0133   PCO2ART 30.0 (L) 03/16/2015 0133   PO2ART 80.0 03/16/2015 0133   HCO3 21.1 03/16/2015 0133   TCO2 22 03/16/2015 0133   ACIDBASEDEF 2.0 03/16/2015 0133   O2SAT 97.0 03/16/2015 0133    Vent Settings: N/A   Condition -Stable  Family Communication  : Patient will update family himself.  Code Status :  Full Code  Diet :  Diet Order            Diet regular Room service appropriate? Yes; Fluid consistency: Thin  Diet effective now                  Disposition Plan  :   Status is: Inpatient  Remains inpatient appropriate because:Inpatient level of care appropriate due to severity of illness   Dispo: The patient is from:  Home              Anticipated d/c is to: Home              Anticipated d/c date is: 3 days              Patient currently is not medically stable to d/c.   Difficult to place patient No   Barriers to discharge: complete 5 days of IV Remdesivir  Antimicorbials  :    Anti-infectives (From admission, onward)   Start     Dose/Rate Route Frequency Ordered Stop   08/04/20 1000  azithromycin (ZITHROMAX) tablet 1,200 mg        1,200 mg Oral Weekly 07/25/20 1049     07/26/20 1800  azithromycin (ZITHROMAX) tablet 500 mg        500 mg Oral Daily-1800 07/26/20 1107 07/29/20 1759   07/25/20 1200  sulfamethoxazole-trimethoprim (BACTRIM) 400-80 MG per tablet 1 tablet        1 tablet Oral Daily 07/25/20 1047     07/25/20 1000  remdesivir 100 mg in sodium chloride 0.9 % 100 mL IVPB       "Followed by" Linked Group Details   100 mg 200 mL/hr over 30 Minutes Intravenous Daily 07/24/20 1841 07/29/20 0959   07/24/20 1845  remdesivir 200 mg in sodium chloride 0.9% 250 mL IVPB       "Followed by" Linked Group Details   200 mg 580 mL/hr over 30 Minutes Intravenous Once 07/24/20 1841 07/24/20 2203   07/24/20 1845  azithromycin (ZITHROMAX) 500 mg in sodium chloride 0.9 % 250 mL IVPB  Status:  Discontinued        500 mg 250 mL/hr over 60 Minutes Intravenous Every 24 hours 07/24/20 1841 07/26/20 1107   07/24/20 1845  cefTRIAXone (ROCEPHIN) 1 g in sodium chloride 0.9 % 100 mL IVPB  Status:  Discontinued        1 g 200 mL/hr over 30 Minutes Intravenous Every 24  hours 07/24/20 1841 07/24/20 1845   07/24/20 1645  cefTRIAXone (ROCEPHIN) 2 g in sodium chloride 0.9 % 100 mL IVPB        2 g 200 mL/hr over 30 Minutes Intravenous Every 24 hours 07/24/20 1644 07/28/20 2359   07/24/20 1645  azithromycin (ZITHROMAX) tablet 500 mg  Status:  Discontinued        500 mg Oral Daily 07/24/20 1644 07/24/20 1846   07/24/20 1630  levofloxacin (LEVAQUIN) IVPB 750 mg  Status:  Discontinued        750 mg 100 mL/hr over 90  Minutes Intravenous  Once 07/24/20 1619 07/24/20 1642      Inpatient Medications  Scheduled Meds: . vitamin C  500 mg Oral Daily  . [START ON 08/04/2020] azithromycin  1,200 mg Oral Weekly  . azithromycin  500 mg Oral q1800  . methylPREDNISolone (SOLU-MEDROL) injection  40 mg Intravenous Q12H  . pantoprazole  40 mg Oral Q1200  . sulfamethoxazole-trimethoprim  1 tablet Oral Daily  . zinc sulfate  220 mg Oral Daily   Continuous Infusions: . sodium chloride 100 mL/hr at 07/25/20 2348  . cefTRIAXone (ROCEPHIN)  IV 2 g (07/26/20 1647)  . remdesivir 100 mg in NS 100 mL 100 mg (07/27/20 0854)   PRN Meds:.acetaminophen, guaiFENesin-dextromethorphan, ondansetron **OR** ondansetron (ZOFRAN) IV   Time Spent in minutes  25  See all Orders from today for further details   Jeoffrey MassedShanker Phillippe Orlick M.D on 07/27/2020 at 10:00 AM  To page go to [email or web address] - use universal password  [facility] -  Office  [phone]    Objective:   Vitals:   07/26/20 1928 07/26/20 2328 07/27/20 0321 07/27/20 0716  BP: (!) 90/57 93/70 100/62 96/73  Pulse: 69 75 80 84  Resp: 20 18 19 19   Temp: (!) 97.5 F (36.4 C) (!) 97.5 F (36.4 C) 97.6 F (36.4 C) 97.7 F (36.5 C)  TempSrc: Oral Oral Oral Oral  SpO2: 97% 93% 94% 93%  Weight:      Height:        Wt Readings from Last 3 Encounters:  07/24/20 64.3 kg  04/25/19 63 kg  03/30/19 62.3 kg     Intake/Output Summary (Last 24 hours) at 07/27/2020 1000 Last data filed at 07/27/2020 0948 Gross per 24 hour  Intake 480 ml  Output 3000 ml  Net -2520 ml     Physical Exam Gen Exam:Alert awake-not in any distress HEENT:atraumatic, normocephalic Chest: B/L clear to auscultation anteriorly CVS:S1S2 regular Abdomen:soft non tender, non distended Extremities:no edema Neurology: Non focal Skin: no rash   Data Review:    CBC Recent Labs  Lab 07/24/20 1435 07/25/20 0129 07/26/20 0027 07/27/20 0114  WBC 15.6* 13.6* 11.4* 9.9  HGB 14.5  11.5* 11.6* 12.6*  HCT 44.1 33.0* 34.8* 38.4*  PLT 436* 308 313 308  MCV 76.2* 75.7* 77.0* 77.7*  MCH 25.0* 26.4 25.7* 25.5*  MCHC 32.9 34.8 33.3 32.8  RDW 13.3 13.4 13.3 13.2  LYMPHSABS  --  1.1 2.2 1.8  MONOABS  --  0.3 0.5 0.9  EOSABS  --  0.0 0.1 1.0*  BASOSABS  --  0.0 0.0 0.0    Chemistries  Recent Labs  Lab 07/24/20 1435 07/24/20 1618 07/25/20 0129 07/26/20 0027 07/27/20 0114  NA 130*  --  133* 132* 132*  K 4.0  --  3.5 4.0 3.8  CL 96*  --  101 103 98  CO2 21*  --  21* 21* 25  GLUCOSE 182*  --  162* 143* 99  BUN 9  --  7 9 12   CREATININE 0.86  --  0.63 0.65 0.73  CALCIUM 8.8*  --  8.2* 8.2* 8.4*  MG  --   --  1.6* 1.9 1.9  AST  --  25 19 14* 31  ALT  --  19 17 15 27   ALKPHOS  --  89 80 61 67  BILITOT  --  1.8* 0.8 0.6 0.8   ------------------------------------------------------------------------------------------------------------------ No results for input(s): CHOL, HDL, LDLCALC, TRIG, CHOLHDL, LDLDIRECT in the last 72 hours.  Lab Results  Component Value Date   HGBA1C 6.3 (H) 03/16/2015   ------------------------------------------------------------------------------------------------------------------ No results for input(s): TSH, T4TOTAL, T3FREE, THYROIDAB in the last 72 hours.  Invalid input(s): FREET3 ------------------------------------------------------------------------------------------------------------------ Recent Labs    07/25/20 0129  FERRITIN 501*    Coagulation profile Recent Labs  Lab 07/24/20 1618  INR 1.2    Recent Labs    07/26/20 0027 07/27/20 0114  DDIMER <0.27 <0.27    Cardiac Enzymes No results for input(s): CKMB, TROPONINI, MYOGLOBIN in the last 168 hours.  Invalid input(s): CK ------------------------------------------------------------------------------------------------------------------ No results found for: BNP  Micro Results Recent Results (from the past 240 hour(s))  Resp Panel by RT-PCR (Flu A&B,  Covid) Nasopharyngeal Swab     Status: Abnormal   Collection Time: 07/24/20  2:18 PM   Specimen: Nasopharyngeal Swab; Nasopharyngeal(NP) swabs in vial transport medium  Result Value Ref Range Status   SARS Coronavirus 2 by RT PCR POSITIVE (A) NEGATIVE Final    Comment: RESULT CALLED TO, READ BACK BY AND VERIFIED WITH: RN DSP [id number] 07/29/20 FCP (NOTE) SARS-CoV-2 target nucleic acids are DETECTED.  The SARS-CoV-2 RNA is generally detectable in upper respiratory specimens during the acute phase of infection. Positive results are indicative of the presence of the identified virus, but do not rule out bacterial infection or co-infection with other pathogens not detected by the test. Clinical correlation with patient history and other diagnostic information is necessary to determine patient infection status. The expected result is Negative.  Fact Sheet for Patients: 07/26/20  Fact Sheet for Healthcare Providers: 440102  This test is not yet approved or cleared by the [employer] FDA and  has been authorized for detection and/or diagnosis of SARS-CoV-2 by FDA under an Emergency Use Authorization (EUA).  This EUA will remain in effect (meaning this test can be used) for  the duration of  the COVID-19 declaration under Section 564(b)(1) of the Act, 21 U.S.C. section 360bbb-3(b)(1), unless the authorization is terminated or revoked sooner.     Influenza A by PCR NEGATIVE NEGATIVE Final   Influenza B by PCR NEGATIVE NEGATIVE Final    Comment: (NOTE) The Xpert Xpress SARS-CoV-2/FLU/RSV plus assay is intended as an aid in the diagnosis of influenza from Nasopharyngeal swab specimens and should not be used as a sole basis for treatment. Nasal washings and aspirates are unacceptable for Xpert Xpress SARS-CoV-2/FLU/RSV testing.  Fact Sheet for Patients: SeriousBroker.it  Fact Sheet for Healthcare  Providers: [place]  This test is not yet approved or cleared by the [employer] FDA and has been authorized for detection and/or diagnosis of SARS-CoV-2 by FDA under an Emergency Use Authorization (EUA). This EUA will remain in effect (meaning this test can be used) for the duration of the COVID-19 declaration under Section 564(b)(1) of the Act, 21 U.S.C. section 360bbb-3(b)(1), unless the authorization is terminated or revoked.  Performed at [facility], [address] [place], [address] [place]  Blood Culture (routine x 2)     Status: None (Preliminary result)   Collection Time: 07/24/20  4:30 PM   Specimen: BLOOD  Result Value Ref Range Status   Specimen Description BLOOD SITE NOT SPECIFIED  Final   Special Requests   Final    BOTTLES DRAWN AEROBIC AND ANAEROBIC Blood Culture adequate volume   Culture   Final    NO GROWTH 2 DAYS Performed at [facility], [address], [place], [place] [address]    Report Status PENDING  Incomplete  Blood Culture (routine x 2)     Status: None (Preliminary result)   Collection Time: 07/24/20  4:45 PM   Specimen: BLOOD  Result Value Ref Range Status   Specimen Description BLOOD SITE NOT SPECIFIED  Final   Special Requests   Final    BOTTLES DRAWN AEROBIC AND ANAEROBIC Blood Culture adequate volume   Culture   Final    NO GROWTH 2 DAYS Performed at [facility], [address] [place], [place] [address]    Report Status PENDING  Incomplete  MRSA PCR Screening     Status: None   Collection Time: 07/24/20  9:30 PM   Specimen: Nasal Mucosa; Nasopharyngeal  Result Value Ref Range Status   MRSA by PCR NEGATIVE NEGATIVE Final    Comment:        The GeneXpert MRSA Assay (FDA approved for NASAL specimens only), is one component of a comprehensive MRSA colonization surveillance program. It is not intended to diagnose MRSA infection nor to guide or monitor treatment for MRSA  infections. Performed at [facility], [address] [place], [place] [address]     Radiology Reports DG Chest [place] 1 View  Result Date: 07/24/2020 CLINICAL DATA:  COVID positive.  Body aches and fever.  Febrile EXAM: PORTABLE CHEST 1 VIEW COMPARISON:  03/13/2019 radiograph, CT 03/15/2019 FINDINGS: Normal cardiac silhouette. There is chronic nodular opacity in the RIGHT lower lobe similar to comparison CT and radiograph. LEFT lung is clear. No pneumothorax. IMPRESSION: Chronic bronchiectasis and nodularity in the RIGHT lower lobe consistent with chronic or recurrent pulmonary infection. Consider aspiration and fungal infections. Electronically Signed   By: Genevive Bi M.D.   On: 07/24/2020 17:01

## 2020-07-27 NOTE — Procedures (Signed)
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 After donning PPI, I consented the patient with the help of a Spanish Language interpreter. He was then placed in the right lateral decubitus position. Despite adequate anesthesia with 10 ml 1% lidocaine, excellent anatomical landmarks, and good positioning, I was unable to obtain CSF at either of two locations (L4-L5 and L3-L4). A sterile bandage was applied. The LP will need to be done under fluoroscopy, unfortunately.  Please, re-contact neurology should you need additional assistance in this patient's care.  Aram Candela Ashland Osmer

## 2020-07-28 ENCOUNTER — Inpatient Hospital Stay (HOSPITAL_COMMUNITY): Payer: HRSA Program

## 2020-07-28 DIAGNOSIS — Z7289 Other problems related to lifestyle: Secondary | ICD-10-CM | POA: Diagnosis not present

## 2020-07-28 DIAGNOSIS — U071 COVID-19: Secondary | ICD-10-CM | POA: Diagnosis not present

## 2020-07-28 DIAGNOSIS — A4189 Other specified sepsis: Secondary | ICD-10-CM | POA: Diagnosis not present

## 2020-07-28 LAB — CBC WITH DIFFERENTIAL/PLATELET
Abs Immature Granulocytes: 0.1 10*3/uL — ABNORMAL HIGH (ref 0.00–0.07)
Basophils Absolute: 0 10*3/uL (ref 0.0–0.1)
Basophils Relative: 0 %
Eosinophils Absolute: 1 10*3/uL — ABNORMAL HIGH (ref 0.0–0.5)
Eosinophils Relative: 13 %
HCT: 39.1 % (ref 39.0–52.0)
Hemoglobin: 13.1 g/dL (ref 13.0–17.0)
Immature Granulocytes: 1 %
Lymphocytes Relative: 18 %
Lymphs Abs: 1.5 10*3/uL (ref 0.7–4.0)
MCH: 25.7 pg — ABNORMAL LOW (ref 26.0–34.0)
MCHC: 33.5 g/dL (ref 30.0–36.0)
MCV: 76.7 fL — ABNORMAL LOW (ref 80.0–100.0)
Monocytes Absolute: 0.8 10*3/uL (ref 0.1–1.0)
Monocytes Relative: 9 %
Neutro Abs: 4.8 10*3/uL (ref 1.7–7.7)
Neutrophils Relative %: 59 %
Platelets: 308 10*3/uL (ref 150–400)
RBC: 5.1 MIL/uL (ref 4.22–5.81)
RDW: 13.2 % (ref 11.5–15.5)
WBC: 8.2 10*3/uL (ref 4.0–10.5)
nRBC: 0 % (ref 0.0–0.2)

## 2020-07-28 LAB — COMPREHENSIVE METABOLIC PANEL
ALT: 48 U/L — ABNORMAL HIGH (ref 0–44)
AST: 56 U/L — ABNORMAL HIGH (ref 15–41)
Albumin: 2.9 g/dL — ABNORMAL LOW (ref 3.5–5.0)
Alkaline Phosphatase: 82 U/L (ref 38–126)
Anion gap: 10 (ref 5–15)
BUN: 12 mg/dL (ref 6–20)
CO2: 23 mmol/L (ref 22–32)
Calcium: 8.4 mg/dL — ABNORMAL LOW (ref 8.9–10.3)
Chloride: 98 mmol/L (ref 98–111)
Creatinine, Ser: 0.64 mg/dL (ref 0.61–1.24)
GFR, Estimated: >60 mL/min (ref >60)
Glucose, Bld: 98 mg/dL (ref 70–99)
Potassium: 3.7 mmol/L (ref 3.5–5.1)
Sodium: 131 mmol/L — ABNORMAL LOW (ref 135–145)
Total Bilirubin: 0.8 mg/dL (ref 0.3–1.2)
Total Protein: 5.9 g/dL — ABNORMAL LOW (ref 6.5–8.1)

## 2020-07-28 LAB — CSF CELL COUNT WITH DIFFERENTIAL
RBC Count, CSF: 1950 /mm3 — ABNORMAL HIGH
Tube #: 3
WBC, CSF: 8 /mm3 — ABNORMAL HIGH (ref 0–5)

## 2020-07-28 LAB — GLUCOSE, CSF: Glucose, CSF: 42 mg/dL (ref 40–70)

## 2020-07-28 LAB — RPR: RPR Ser Ql: NONREACTIVE

## 2020-07-28 LAB — D-DIMER, QUANTITATIVE: D-Dimer, Quant: <0.27 ug/mL-FEU (ref 0.00–0.50)

## 2020-07-28 LAB — PROTEIN, CSF: Total  Protein, CSF: 40 mg/dL (ref 15–45)

## 2020-07-28 LAB — CRYPTOCOCCAL ANTIGEN, CSF: Crypto Ag: NEGATIVE

## 2020-07-28 LAB — C-REACTIVE PROTEIN: CRP: 2.4 mg/dL — ABNORMAL HIGH (ref <1.0)

## 2020-07-28 LAB — MAGNESIUM: Magnesium: 1.9 mg/dL (ref 1.7–2.4)

## 2020-07-28 IMAGING — RF DG FLUORO GUIDE SPINAL/SI JT INJ*L*
3 series · 3 of 3 positions shown · non-contrast
Comparison: none

CLINICAL DATA: Fluoroscopic leak guided LP

[Series 1: cp_standard · 0.25mm/px · 1 of 1 slices shown (1 of 3)]
[im 1/1]
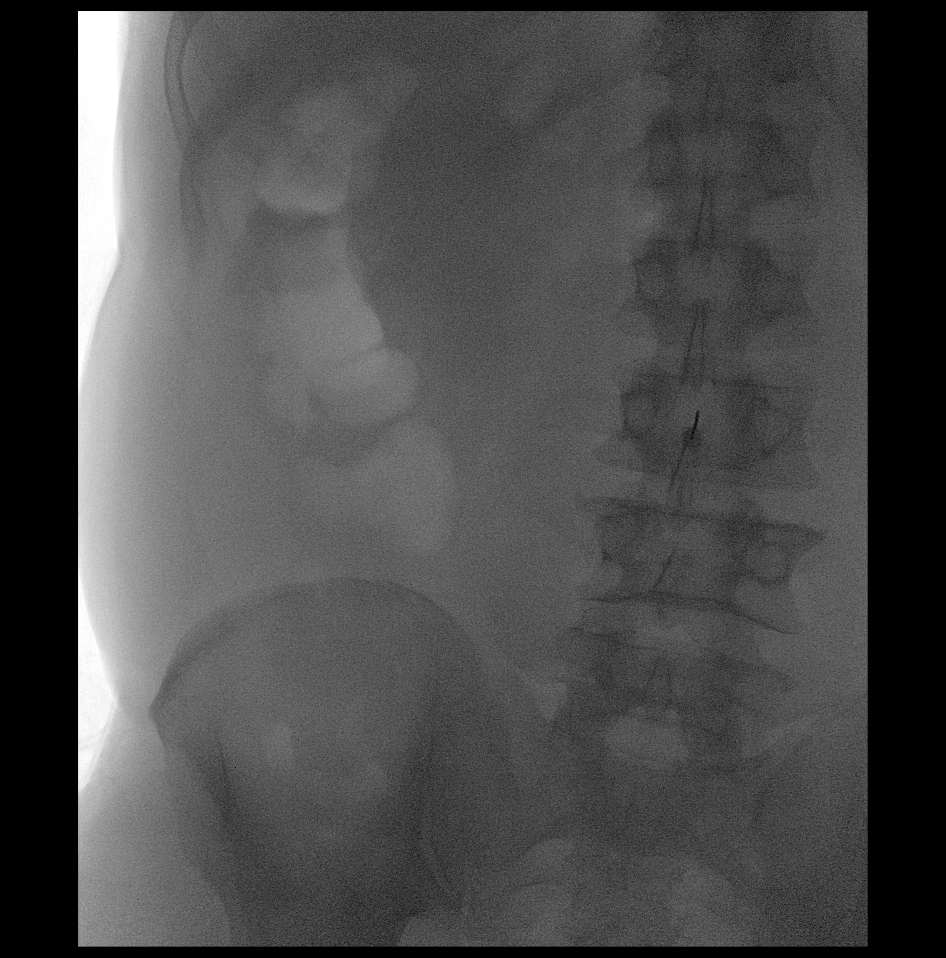

[Series 2: cp_standard · 0.25mm/px · 1 of 1 slices shown (2 of 3)]
[im 1/1]
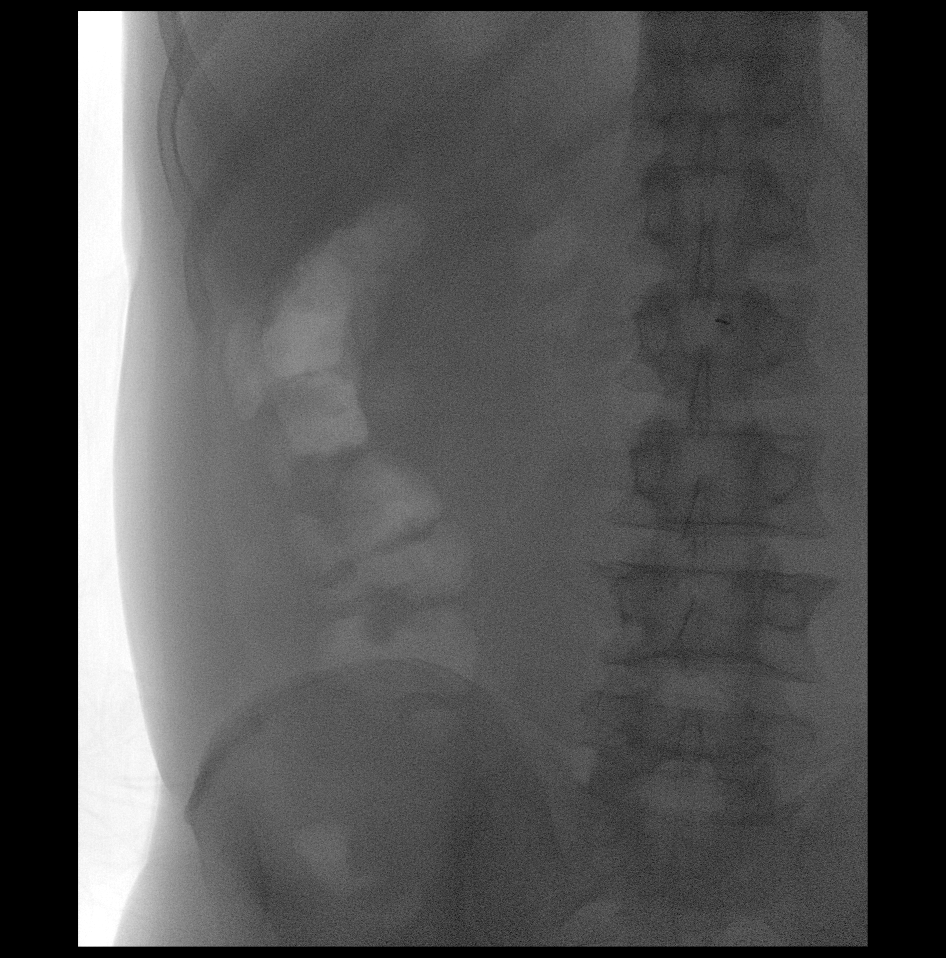

[Series 3: cp_standard · 0.25mm/px · 1 of 1 slices shown (3 of 3)]
[im 1/1]
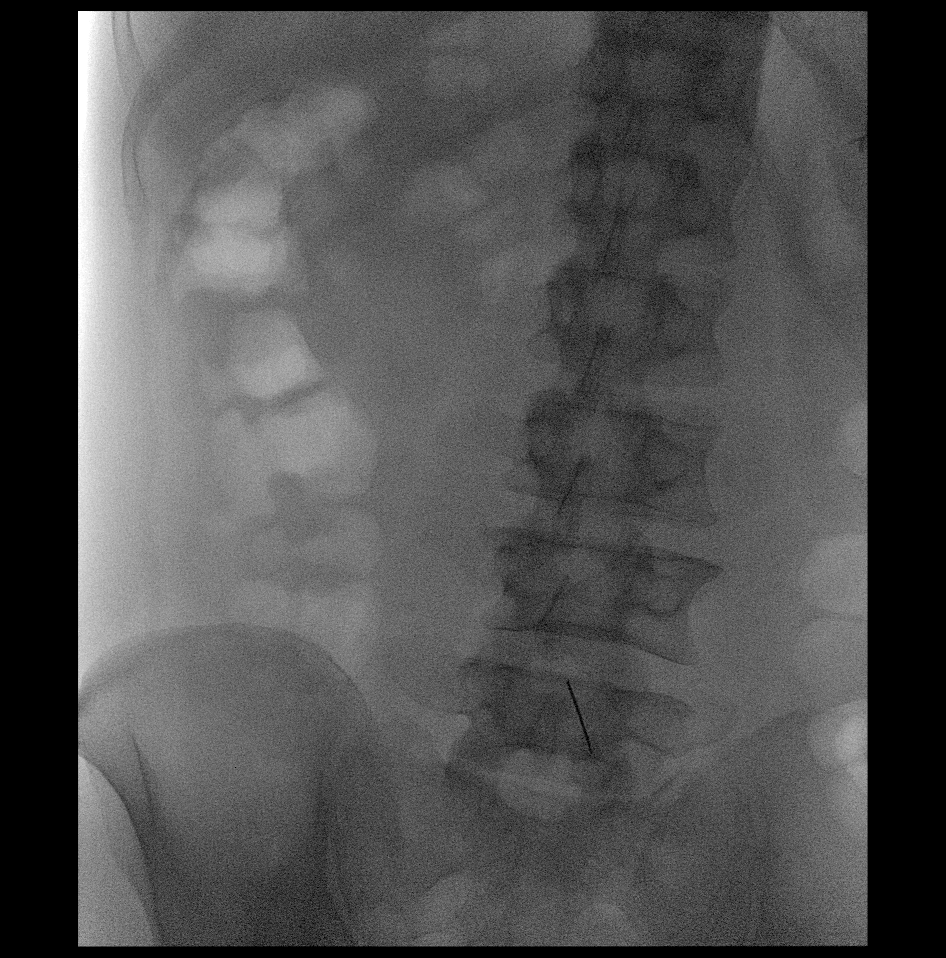

[3 of 3 positions shown; findings below may reference images not displayed]

EXAM:
DIAGNOSTIC LUMBAR PUNCTURE UNDER FLUOROSCOPIC GUIDANCE

FLUOROSCOPY TIME:  Fluoroscopy Time:  2.9 minutes

Number of Acquired Spot Images: 0

PROCEDURE:
Informed consent was obtained from the patient prior to the
procedure, including potential complications of headache, allergy,
and pain. With the patient prone, the lower back was prepped with
Betadine. 1% Lidocaine was used for local anesthesia. Lumbar
puncture was performed at the L4-5 level using a 20 gauge needle
with return of slightly pink tinged clear CSF. Sixteen ml of CSF
were obtained for laboratory studies. The CSF was completely clear
by the end of collection. The patient tolerated the procedure well
and there were no apparent complications.

Of note, this was a difficult lumbar puncture. CSF was finally
obtained at the third attempted level. The difficulty was likely due
to low CSF pressure. Significant tilting of the table and rolling
the patient into a left lateral decubitus position was necessary to
obtain the fluid. The patient did have radiculopathy intermittently
throughout the study. The radiculopathy dissipated shortly after
completion of the study.
IMPRESSION: Difficult but successful lumbar puncture. See above for details. 16
cc of fluid was sent to the lab. The first 2 vials of fluid
demonstrated slightly pink tinged clear color. The last 2 vials were
clear to visualization.

## 2020-07-28 NOTE — Progress Notes (Signed)
PROGRESS NOTE                                                                                                                                                                                                             Patient Demographics:    [facility], is a 45 y.o. male, DOB - 01/18/1976, XBM:[id number]  Outpatient Primary MD for the patient is Patient, No Pcp Per   Admit date - 07/24/2020   LOS - 4  Chief Complaint  Patient presents with  . Fever  . Generalized Body Aches       Brief Narrative: Patient is a 45 y.o. male with PMHx of HIV-noncompliant with ART-presented to the hospital with 1 week history of cough/fevers and myalgias.  Found to have COVID-19 infection and subsequently admitted to the hospitalist service.  See below for further details  COVID-19 vaccinated status: Unvaccinated  Significant Events: 2/15>> Admit to [facility] for possible COVID-19 pneumonia  Significant studies: 2/15>>Chest x-ray: Chronic bronchiectasis/nodularity in the right LLL 2/17>>CD4 <35  COVID-19 medications: Steroids: 2/15>> Remdesivir: 2/15>>2/19  Antibiotics: Rocephin: 2/15>>2/19 Zithromax: 2/15>>2/19  Microbiology data: 2/15 >>blood culture: No growth  Procedures: None  Consults: None  DVT prophylaxis: SCDs for now    Subjective:   Continues to have headaches-mostly in the mornings. Appears to be positional at times.  Seen with iPad translator at bedside.   Assessment  & Plan :   COVID-19 infection-possible RLL bacterial pneumonia: Overall improved-we will complete Remdesivir/Rocephin/Zithromax today-rapidly taper off steroids.  Fever: afebrile O2 requirements: Room air. SpO2: 94 %   COVID-19 Labs: Recent Labs    07/26/20 0027 07/27/20 0114 07/28/20 0331  DDIMER <0.27 <0.27 <0.27  CRP 7.8* 3.0* 2.4*    No results found for: BNP  No results for input(s): PROCALCITON in the last  168 hours.  Lab Results  Component Value Date   SARSCOV2NAA POSITIVE (A) 07/24/2020   SARSCOV2NAA NEGATIVE 03/13/2019   SARSCOV2NAA NEGATIVE 12/05/2018     Prone/Incentive Spirometry: encouraged incentive spirometry use 3-4/hour.  Headache: Initially attributed to acute illness/fever/Covid infection-however apparently going on for approximately a month per patient-he continues to have headaches almost on a daily basis-especially worse in the morning. Given advanced HIV/AIDS with CD4 count less than 35-need to rule out cryptococcal meningitis. Neurology attempted LP-dry tap. Have placed order for LP under fluoroscopy.  Have left message for radiologist at his office this morning.  HIV/AIDS: Noncompliant with antiretrovirals (claims he had dizziness)-on Bactrim/Zithromax for prophylaxis. ID following-with plans to restart ART's once cryptococcal meningitis ruled out.  Oral thrush: On fluconazole  History of alcohol: No withdrawal symptoms  GI prophylaxis: PP  ABG:    Component Value Date/Time   PHART 7.455 (H) 03/16/2015 0133   PCO2ART 30.0 (L) 03/16/2015 0133   PO2ART 80.0 03/16/2015 0133   HCO3 21.1 03/16/2015 0133   TCO2 22 03/16/2015 0133   ACIDBASEDEF 2.0 03/16/2015 0133   O2SAT 97.0 03/16/2015 0133    Vent Settings: N/A   Condition -Stable  Family Communication  : Patient will update family himself.  Code Status :  Full Code  Diet :  Diet Order            Diet regular Room service appropriate? Yes; Fluid consistency: Thin  Diet effective now                  Disposition Plan  :   Status is: Inpatient  Remains inpatient appropriate because:Inpatient level of care appropriate due to severity of illness   Dispo: The patient is from: Home              Anticipated d/c is to: Home              Anticipated d/c date is: 3 days              Patient currently is not medically stable to d/c.   Difficult to place patient No   Barriers to discharge: complete 5  days of IV Remdesivir  Antimicorbials  :    Anti-infectives (From admission, onward)   Start     Dose/Rate Route Frequency Ordered Stop   08/04/20 1000  azithromycin (ZITHROMAX) tablet 1,200 mg        1,200 mg Oral Weekly 07/25/20 1049     07/27/20 1745  fluconazole (DIFLUCAN) tablet 200 mg        200 mg Oral Daily 07/27/20 1653     07/27/20 1745  bictegravir-emtricitabine-tenofovir AF (BIKTARVY) 50-200-25 MG per tablet 1 tablet  Status:  Discontinued        1 tablet Oral Daily 07/27/20 1654 07/27/20 1720   07/27/20 1730  fluconazole (DIFLUCAN) tablet 200 mg  Status:  Discontinued        200 mg Oral Daily 07/27/20 1632 07/27/20 1634   07/26/20 1800  azithromycin (ZITHROMAX) tablet 500 mg        500 mg Oral Daily-1800 07/26/20 1107 07/29/20 1759   07/25/20 1200  sulfamethoxazole-trimethoprim (BACTRIM) 400-80 MG per tablet 1 tablet        1 tablet Oral Daily 07/25/20 1047     07/25/20 1000  remdesivir 100 mg in sodium chloride 0.9 % 100 mL IVPB       "Followed by" Linked Group Details   100 mg 200 mL/hr over 30 Minutes Intravenous Daily 07/24/20 1841 07/28/20 0909   07/24/20 1845  remdesivir 200 mg in sodium chloride 0.9% 250 mL IVPB       "Followed by" Linked Group Details   200 mg 580 mL/hr over 30 Minutes Intravenous Once 07/24/20 1841 07/24/20 2203   07/24/20 1845  azithromycin (ZITHROMAX) 500 mg in sodium chloride 0.9 % 250 mL IVPB  Status:  Discontinued        500 mg 250 mL/hr over 60 Minutes Intravenous Every 24 hours 07/24/20 1841 07/26/20 1107   07/24/20  1845  cefTRIAXone (ROCEPHIN) 1 g in sodium chloride 0.9 % 100 mL IVPB  Status:  Discontinued        1 g 200 mL/hr over 30 Minutes Intravenous Every 24 hours 07/24/20 1841 07/24/20 1845   07/24/20 1645  cefTRIAXone (ROCEPHIN) 2 g in sodium chloride 0.9 % 100 mL IVPB        2 g 200 mL/hr over 30 Minutes Intravenous Every 24 hours 07/24/20 1644 07/28/20 2359   07/24/20 1645  azithromycin (ZITHROMAX) tablet 500 mg  Status:   Discontinued        500 mg Oral Daily 07/24/20 1644 07/24/20 1846   07/24/20 1630  levofloxacin (LEVAQUIN) IVPB 750 mg  Status:  Discontinued        750 mg 100 mL/hr over 90 Minutes Intravenous  Once 07/24/20 1619 07/24/20 1642      Inpatient Medications  Scheduled Meds: . vitamin C  500 mg Oral Daily  . [START ON 08/04/2020] azithromycin  1,200 mg Oral Weekly  . azithromycin  500 mg Oral q1800  . fluconazole  200 mg Oral Daily  . pantoprazole  40 mg Oral Q1200  . predniSONE  40 mg Oral Q breakfast  . sulfamethoxazole-trimethoprim  1 tablet Oral Daily  . zinc sulfate  220 mg Oral Daily   Continuous Infusions: . sodium chloride 100 mL/hr at 07/25/20 2348  . cefTRIAXone (ROCEPHIN)  IV Stopped (07/28/20 0226)   PRN Meds:.acetaminophen, guaiFENesin-dextromethorphan, ondansetron **OR** ondansetron (ZOFRAN) IV   Time Spent in minutes  25  See all Orders from today for further details   Jeoffrey Massed M.D on 07/28/2020 at 9:29 AM  To page go to [email or web address] - use universal password  [facility] -  Office  [phone]    Objective:   Vitals:   07/28/20 0015 07/28/20 0420 07/28/20 0421 07/28/20 0802  BP: 100/69 (!) 88/61 100/63 105/68  Pulse:    82  Resp:  15 15 16   Temp:  97.9 F (36.6 C) 97.9 F (36.6 C) 97.9 F (36.6 C)  TempSrc:  Oral Oral Oral  SpO2:  96% 94% 94%  Weight:      Height:        Wt Readings from Last 3 Encounters:  07/24/20 64.3 kg  04/25/19 63 kg  03/30/19 62.3 kg     Intake/Output Summary (Last 24 hours) at 07/28/2020 0929 Last data filed at 07/28/2020 07/30/2020 Gross per 24 hour  Intake 1999.08 ml  Output 2125 ml  Net -125.92 ml     Physical Exam Gen Exam:Alert awake-not in any distress HEENT:atraumatic, normocephalic Chest: B/L clear to auscultation anteriorly CVS:S1S2 regular Abdomen:soft non tender, non distended Extremities:no edema Neurology: Non focal Skin: no rash   Data Review:    CBC Recent Labs  Lab  07/24/20 1435 07/25/20 0129 07/26/20 0027 07/27/20 0114 07/28/20 0331  WBC 15.6* 13.6* 11.4* 9.9 8.2  HGB 14.5 11.5* 11.6* 12.6* 13.1  HCT 44.1 33.0* 34.8* 38.4* 39.1  PLT 436* 308 313 308 308  MCV 76.2* 75.7* 77.0* 77.7* 76.7*  MCH 25.0* 26.4 25.7* 25.5* 25.7*  MCHC 32.9 34.8 33.3 32.8 33.5  RDW 13.3 13.4 13.3 13.2 13.2  LYMPHSABS  --  1.1 2.2 1.8 1.5  MONOABS  --  0.3 0.5 0.9 0.8  EOSABS  --  0.0 0.1 1.0* 1.0*  BASOSABS  --  0.0 0.0 0.0 0.0    Chemistries  Recent Labs  Lab 07/24/20 1435 07/24/20 1618 07/25/20 0129 07/26/20 0027 07/27/20 0114 07/28/20 0331  NA 130*  --  133* 132* 132* 131*  K 4.0  --  3.5 4.0 3.8 3.7  CL 96*  --  101 103 98 98  CO2 21*  --  21* 21* 25 23  GLUCOSE 182*  --  162* 143* 99 98  BUN 9  --  7 9 12 12   CREATININE 0.86  --  0.63 0.65 0.73 0.64  CALCIUM 8.8*  --  8.2* 8.2* 8.4* 8.4*  MG  --   --  1.6* 1.9 1.9 1.9  AST  --  25 19 14* 31 56*  ALT  --  48*  ALKPHOS  --  89 80 61 67 82  BILITOT  --  1.8* 0.8 0.6 0.8 0.8   ------------------------------------------------------------------------------------------------------------------ No results for input(s): CHOL, HDL, LDLCALC, TRIG, CHOLHDL, LDLDIRECT in the last 72 hours.  Lab Results  Component Value Date   HGBA1C 6.3 (H) 03/16/2015   ------------------------------------------------------------------------------------------------------------------ No results for input(s): TSH, T4TOTAL, T3FREE, THYROIDAB in the last 72 hours.  Invalid input(s): FREET3 ------------------------------------------------------------------------------------------------------------------ No results for input(s): VITAMINB12, FOLATE, FERRITIN, TIBC, IRON, RETICCTPCT in the last 72 hours.  Coagulation profile Recent Labs  Lab 07/24/20 1618  INR 1.2    Recent Labs    07/27/20 0114 07/28/20 0331  DDIMER <0.27 <0.27    Cardiac Enzymes No results for input(s): CKMB, TROPONINI, MYOGLOBIN in  the last 168 hours.  Invalid input(s): CK ------------------------------------------------------------------------------------------------------------------ No results found for: BNP  Micro Results Recent Results (from the past 240 hour(s))  Resp Panel by RT-PCR (Flu A&B, Covid) Nasopharyngeal Swab     Status: Abnormal   Collection Time: 07/24/20  2:18 PM   Specimen: Nasopharyngeal Swab; Nasopharyngeal(NP) swabs in vial transport medium  Result Value Ref Range Status   SARS Coronavirus 2 by RT PCR POSITIVE (A) NEGATIVE Final    Comment: RESULT CALLED TO, READ BACK BY AND VERIFIED WITH: RN DSP [id number] FCP (NOTE) SARS-CoV-2 target nucleic acids are DETECTED.  The SARS-CoV-2 RNA is generally detectable in upper respiratory specimens during the acute phase of infection. Positive results are indicative of the presence of the identified virus, but do not rule out bacterial infection or co-infection with other pathogens not detected by the test. Clinical correlation with patient history and other diagnostic information is necessary to determine patient infection status. The expected result is Negative.  Fact Sheet for Patients: BloggerCourse.com  Fact Sheet for Healthcare Providers: SeriousBroker.it  This test is not yet approved or cleared by the [place] FDA and  has been authorized for detection and/or diagnosis of SARS-CoV-2 by FDA under an Emergency Use Authorization (EUA).  This EUA will remain in effect (meaning this test can be used) for  the duration of  the COVID-19 declaration under Section 564(b)(1) of the Act, 21 U.S.C. section 360bbb-3(b)(1), unless the authorization is terminated or revoked sooner.     Influenza A by PCR NEGATIVE NEGATIVE Final   Influenza B by PCR NEGATIVE NEGATIVE Final    Comment: (NOTE) The Xpert Xpress SARS-CoV-2/FLU/RSV plus assay is intended as an aid in the diagnosis of influenza  from Nasopharyngeal swab specimens and should not be used as a sole basis for treatment. Nasal washings and aspirates are unacceptable for Xpert Xpress SARS-CoV-2/FLU/RSV testing.  Fact Sheet for Patients: BloggerCourse.com  Fact Sheet for Healthcare Providers: SeriousBroker.it  This test is not yet approved or cleared by the [place] FDA and has been authorized for detection and/or diagnosis of SARS-CoV-2 by  FDA under an Emergency Use Authorization (EUA). This EUA will remain in effect (meaning this test can be used) for the duration of the COVID-19 declaration under Section 564(b)(1) of the Act, 21 U.S.C. section 360bbb-3(b)(1), unless the authorization is terminated or revoked.  Performed at [facility], [address], [place], [place] [address]   Blood Culture (routine x 2)     Status: None (Preliminary result)   Collection Time: 07/24/20  4:30 PM   Specimen: BLOOD  Result Value Ref Range Status   Specimen Description BLOOD SITE NOT SPECIFIED  Final   Special Requests   Final    BOTTLES DRAWN AEROBIC AND ANAEROBIC Blood Culture adequate volume   Culture   Final    NO GROWTH 3 DAYS Performed at [facility], [address] [place], [place] [address]    Report Status PENDING  Incomplete  Blood Culture (routine x 2)     Status: None (Preliminary result)   Collection Time: 07/24/20  4:45 PM   Specimen: BLOOD  Result Value Ref Range Status   Specimen Description BLOOD SITE NOT SPECIFIED  Final   Special Requests   Final    BOTTLES DRAWN AEROBIC AND ANAEROBIC Blood Culture adequate volume   Culture   Final    NO GROWTH 3 DAYS Performed at [facility], [address] [place], [place] [address]    Report Status PENDING  Incomplete  MRSA PCR Screening     Status: None   Collection Time: 07/24/20  9:30 PM   Specimen: Nasal Mucosa; Nasopharyngeal  Result Value Ref Range Status   MRSA by PCR  NEGATIVE NEGATIVE Final    Comment:        The GeneXpert MRSA Assay (FDA approved for NASAL specimens only), is one component of a comprehensive MRSA colonization surveillance program. It is not intended to diagnose MRSA infection nor to guide or monitor treatment for MRSA infections. Performed at [facility], [address], [place], [place] [address]     Radiology Reports DG Chest [place] 1 View  Result Date: 07/24/2020 CLINICAL DATA:  COVID positive.  Body aches and fever.  Febrile EXAM: PORTABLE CHEST 1 VIEW COMPARISON:  03/13/2019 radiograph, CT 03/15/2019 FINDINGS: Normal cardiac silhouette. There is chronic nodular opacity in the RIGHT lower lobe similar to comparison CT and radiograph. LEFT lung is clear. No pneumothorax. IMPRESSION: Chronic bronchiectasis and nodularity in the RIGHT lower lobe consistent with chronic or recurrent pulmonary infection. Consider aspiration and fungal infections. Electronically Signed   By: Genevive Bi M.D.   On: 07/24/2020 17:01

## 2020-07-28 NOTE — Progress Notes (Addendum)
[facility] Infectious Diseases Follow Up Note  Patient Identification: Patient Name: Jon Love MRN: [id number] [place] Date: 07/24/2020  2:19 PM Age: 45 y.o.Today's Date: 07/28/2020   Reason for Visit: AIDS/headache/COVID   Principal Problem:   Sepsis due to COVID-19 [facility]) Active Problems:   Protein-calorie malnutrition, severe ([place])   Gastroesophageal reflux disease without esophagitis   AIDS (acquired immune deficiency syndrome) (HCC)   Alcohol use   Multifocal pneumonia   Antibiotics:  azithromycin 2/15-c                     Ceftriaxone 2/15-c                     Bactrim ppx  Lines/Tubes: PIVs   Interval Events: LP attempt was unsuccessful, awaiting LP by IR. Remains afebrile with no leukocytosis.    Assessment AIDs - not on treatment, given concerns for Cryptococcal meningitis  On bactrim and azithromycin ppx  Headache -serum cryopto ag is negative COVID 19 infection - on remdesevir and prednisone Orophayngeal candididasis - on fluconazole Chronic bronchiectasis and nodularity in the RLL - stable radiographically and clinically, has h/o treatment with antibacterials ( cefdinir and metronidazole followed by levofloxacin in  04/2019) Transaminitis   Recommendations Awaiting LP - please send CSF for routine aerobic/anaerobic cx and gram stain , afb and fungal smear and cx. CSF cryptococcal ag, CSF VDRL Acute hep panel  Check Urine GC  FU HIV RNA Continue current antimicrobial and antifungal coverage ( 5 days of ceftriaxone and azithromycin for CAP coverage, 7 days course for fluconazole as previously planned ) Continue bactrim ppx for PJP ppx MAC ppx with azithro after completing CAP treatment  COVID management per Primary  Isolation precautions per Infection Prevention protocol  Rest of the management as per the primary team. Thank you for the consult. Please page with pertinent questions or  concerns.  ______________________________________________________________________ Subjective patient seen and examined at the bedside. Spoke with patient with the help of phone spanish interpreting service.  Continues to have headache, denies blurry vision, neck pain and back pain. Denies any numbness/tingling weakness Has some SOB and chest burning. Denies cough Denies N/V/abdominal pain and diarrhea Denies any rashes Complains of bilateral knee pain  Denies GU symptoms Says he has been not taking ART for few months as ART makes him sick/dizzzy but is interested to be back on treatment He lives alone and works in [employer] BP 105/68 (BP Location: Left Arm)   Pulse 82   Temp 97.9 F (36.6 C) (Oral)   Resp 16   Ht _0  (1.778 m)   Wt 64.3 kg   SpO2 94%   BMI 20.34 kg/m      Physical Exam Not in acute distress, eating breakfast and appears to be comfortable  HEENT - thrush + Chest - clear CVS- Normal s1s2, RRR Abdomen- soft, NT Extremities - no pedal edema Skin - no rashes   Pertinent Microbiology Results for orders placed or performed during the hospital encounter of 07/24/20  Resp Panel by RT-PCR (Flu A&B, Covid) Nasopharyngeal Swab     Status: Abnormal   Collection Time: 07/24/20  2:18 PM   Specimen: Nasopharyngeal Swab; Nasopharyngeal(NP) swabs in vial transport medium  Result Value Ref Range Status   SARS Coronavirus 2 by RT PCR POSITIVE (A) NEGATIVE Final    Comment: RESULT CALLED TO, READ BACK BY AND VERIFIED WITH: RN DSP [id number] FCP (NOTE) SARS-CoV-2 target nucleic  acids are DETECTED.  The SARS-CoV-2 RNA is generally detectable in upper respiratory specimens during the acute phase of infection. Positive results are indicative of the presence of the identified virus, but do not rule out bacterial infection or co-infection with other pathogens not detected by the test. Clinical correlation with patient history and other diagnostic  information is necessary to determine patient infection status. The expected result is Negative.  Fact Sheet for Patients: EntrepreneurPulse.com.au  Fact Sheet for Healthcare Providers: IncredibleEmployment.be  This test is not yet approved or cleared by the [place] FDA and  has been authorized for detection and/or diagnosis of SARS-CoV-2 by FDA under an Emergency Use Authorization (EUA).  This EUA will remain in effect (meaning this test can be used) for  the duration of  the COVID-19 declaration under Section 564(b)(1) of the Act, 21 U.S.C. section 360bbb-3(b)(1), unless the authorization is terminated or revoked sooner.     Influenza A by PCR NEGATIVE NEGATIVE Final   Influenza B by PCR NEGATIVE NEGATIVE Final    Comment: (NOTE) The Xpert Xpress SARS-CoV-2/FLU/RSV plus assay is intended as an aid in the diagnosis of influenza from Nasopharyngeal swab specimens and should not be used as a sole basis for treatment. Nasal washings and aspirates are unacceptable for Xpert Xpress SARS-CoV-2/FLU/RSV testing.  Fact Sheet for Patients: EntrepreneurPulse.com.au  Fact Sheet for Healthcare Providers: IncredibleEmployment.be  This test is not yet approved or cleared by the [place] FDA and has been authorized for detection and/or diagnosis of SARS-CoV-2 by FDA under an Emergency Use Authorization (EUA). This EUA will remain in effect (meaning this test can be used) for the duration of the COVID-19 declaration under Section 564(b)(1) of the Act, 21 U.S.C. section 360bbb-3(b)(1), unless the authorization is terminated or revoked.  Performed at [facility], [place] [address] [place], [place] [address]   Blood Culture (routine x 2)     Status: None (Preliminary result)   Collection Time: 07/24/20  4:30 PM   Specimen: BLOOD  Result Value Ref Range Status   Specimen Description BLOOD SITE NOT  SPECIFIED  Final   Special Requests   Final    BOTTLES DRAWN AEROBIC AND ANAEROBIC Blood Culture adequate volume   Culture   Final    NO GROWTH 3 DAYS Performed at [facility], [address] [place], [place] [address]    Report Status PENDING  Incomplete  Blood Culture (routine x 2)     Status: None (Preliminary result)   Collection Time: 07/24/20  4:45 PM   Specimen: BLOOD  Result Value Ref Range Status   Specimen Description BLOOD SITE NOT SPECIFIED  Final   Special Requests   Final    BOTTLES DRAWN AEROBIC AND ANAEROBIC Blood Culture adequate volume   Culture   Final    NO GROWTH 3 DAYS Performed at [facility], [address], [place], [place] [address]    Report Status PENDING  Incomplete  MRSA PCR Screening     Status: None   Collection Time: 07/24/20  9:30 PM   Specimen: Nasal Mucosa; Nasopharyngeal  Result Value Ref Range Status   MRSA by PCR NEGATIVE NEGATIVE Final    Comment:        The GeneXpert MRSA Assay (FDA approved for NASAL specimens only), is one component of a comprehensive MRSA colonization surveillance program. It is not intended to diagnose MRSA infection nor to guide or monitor treatment for MRSA infections. Performed at [facility], [place] [address], [place], [place] [address]     Pertinent Lab. CBC Latest Ref Rng & Units 07/28/2020 07/27/2020 07/26/2020  WBC 4.0 - 10.5 K/uL 8.2 9.9 11.4(H)  Hemoglobin 13.0 - 17.0 g/dL 13.1 12.6(L) 11.6(L)  Hematocrit 39.0 - 52.0 % 39.1 38.4(L) 34.8(L)  Platelets 150 - 400 K/uL 308 308 313   CMP Latest Ref Rng & Units 07/28/2020 07/27/2020 07/26/2020  Glucose 70 - 99 mg/dL 98 99 143(H)  BUN 6 - 20 mg/dL _0 Creatinine 0.61 - 1.24 mg/dL 0.64 0.73 0.65  Sodium 135 - 145 mmol/L 131(L) 132(L) 132(L)  Potassium 3.5 - 5.1 mmol/L 3.7 3.8 4.0  Chloride 98 - 111 mmol/L 98 98 103  CO2 22 - 32 mmol/L 23 25 21(L)  Calcium 8.9 - 10.3 mg/dL 8.4(L) 8.4(L) 8.2(L)  Total Protein 6.5 - 8.1 g/dL 5.9(L)  5.6(L) 5.0(L)  Total Bilirubin 0.3 - 1.2 mg/dL 0.8 0.8 0.6  Alkaline Phos 38 - 126 U/L 82 67 61  AST 15 - 41 U/L 56(H) 31 14(L)  ALT 0 - 44 U/L 48(H) 27 15     Pertinent Imaging today Plain films and CT images have been personally visualized and interpreted; radiology reports have been reviewed. Decision making incorporated into the Impression / Recommendations.  I have spent approx 30 minutes for this patient encounter including review of prior medical records with greater than 50% of time being face to face and coordination of their care.  Electronically signed by:   Rosiland Oz, MD Infectious Disease Physician [facility] Pager: [phone]

## 2020-07-28 NOTE — Plan of Care (Signed)

## 2020-07-29 ENCOUNTER — Inpatient Hospital Stay (HOSPITAL_COMMUNITY): Payer: HRSA Program

## 2020-07-29 DIAGNOSIS — Z7289 Other problems related to lifestyle: Secondary | ICD-10-CM | POA: Diagnosis not present

## 2020-07-29 DIAGNOSIS — A4189 Other specified sepsis: Secondary | ICD-10-CM | POA: Diagnosis not present

## 2020-07-29 DIAGNOSIS — U071 COVID-19: Secondary | ICD-10-CM | POA: Diagnosis not present

## 2020-07-29 LAB — CBC WITH DIFFERENTIAL/PLATELET
Abs Immature Granulocytes: 0.15 10*3/uL — ABNORMAL HIGH (ref 0.00–0.07)
Basophils Absolute: 0 10*3/uL (ref 0.0–0.1)
Basophils Relative: 1 %
Eosinophils Absolute: 0.9 10*3/uL — ABNORMAL HIGH (ref 0.0–0.5)
Eosinophils Relative: 11 %
HCT: 40.7 % (ref 39.0–52.0)
Hemoglobin: 13.3 g/dL (ref 13.0–17.0)
Immature Granulocytes: 2 %
Lymphocytes Relative: 29 %
Lymphs Abs: 2.4 10*3/uL (ref 0.7–4.0)
MCH: 25 pg — ABNORMAL LOW (ref 26.0–34.0)
MCHC: 32.7 g/dL (ref 30.0–36.0)
MCV: 76.5 fL — ABNORMAL LOW (ref 80.0–100.0)
Monocytes Absolute: 0.8 10*3/uL (ref 0.1–1.0)
Monocytes Relative: 9 %
Neutro Abs: 4 10*3/uL (ref 1.7–7.7)
Neutrophils Relative %: 48 %
Platelets: 298 10*3/uL (ref 150–400)
RBC: 5.32 MIL/uL (ref 4.22–5.81)
RDW: 13.3 % (ref 11.5–15.5)
WBC: 8.2 10*3/uL (ref 4.0–10.5)
nRBC: 0 % (ref 0.0–0.2)

## 2020-07-29 LAB — COMPREHENSIVE METABOLIC PANEL
ALT: 56 U/L — ABNORMAL HIGH (ref 0–44)
AST: 30 U/L (ref 15–41)
Albumin: 2.9 g/dL — ABNORMAL LOW (ref 3.5–5.0)
Alkaline Phosphatase: 83 U/L (ref 38–126)
Anion gap: 10 (ref 5–15)
BUN: 10 mg/dL (ref 6–20)
CO2: 22 mmol/L (ref 22–32)
Calcium: 8.7 mg/dL — ABNORMAL LOW (ref 8.9–10.3)
Chloride: 99 mmol/L (ref 98–111)
Creatinine, Ser: 0.65 mg/dL (ref 0.61–1.24)
GFR, Estimated: >60 mL/min (ref >60)
Glucose, Bld: 103 mg/dL — ABNORMAL HIGH (ref 70–99)
Potassium: 3.7 mmol/L (ref 3.5–5.1)
Sodium: 131 mmol/L — ABNORMAL LOW (ref 135–145)
Total Bilirubin: 1 mg/dL (ref 0.3–1.2)
Total Protein: 6.1 g/dL — ABNORMAL LOW (ref 6.5–8.1)

## 2020-07-29 LAB — HEPATITIS PANEL, ACUTE
HCV Ab: NONREACTIVE
Hep A IgM: NONREACTIVE
Hep B C IgM: NONREACTIVE
Hepatitis B Surface Ag: NONREACTIVE

## 2020-07-29 LAB — C-REACTIVE PROTEIN: CRP: 1.9 mg/dL — ABNORMAL HIGH (ref <1.0)

## 2020-07-29 LAB — CULTURE, BLOOD (ROUTINE X 2)
Culture: NO GROWTH
Culture: NO GROWTH
Special Requests: ADEQUATE
Special Requests: ADEQUATE

## 2020-07-29 LAB — D-DIMER, QUANTITATIVE: D-Dimer, Quant: <0.27 ug/mL-FEU (ref 0.00–0.50)

## 2020-07-29 LAB — MAGNESIUM: Magnesium: 2.1 mg/dL (ref 1.7–2.4)

## 2020-07-29 IMAGING — DX DG ORBITS FOR FOREIGN BODY
2 series · 2 of 2 positions shown · non-contrast
Comparison: None.

CLINICAL DATA: 44-year-old male pending MRI.  History of metal work

EXAM:
ORBITS FOR FOREIGN BODY - 2 VIEW

[skull waters (1 of 2)]
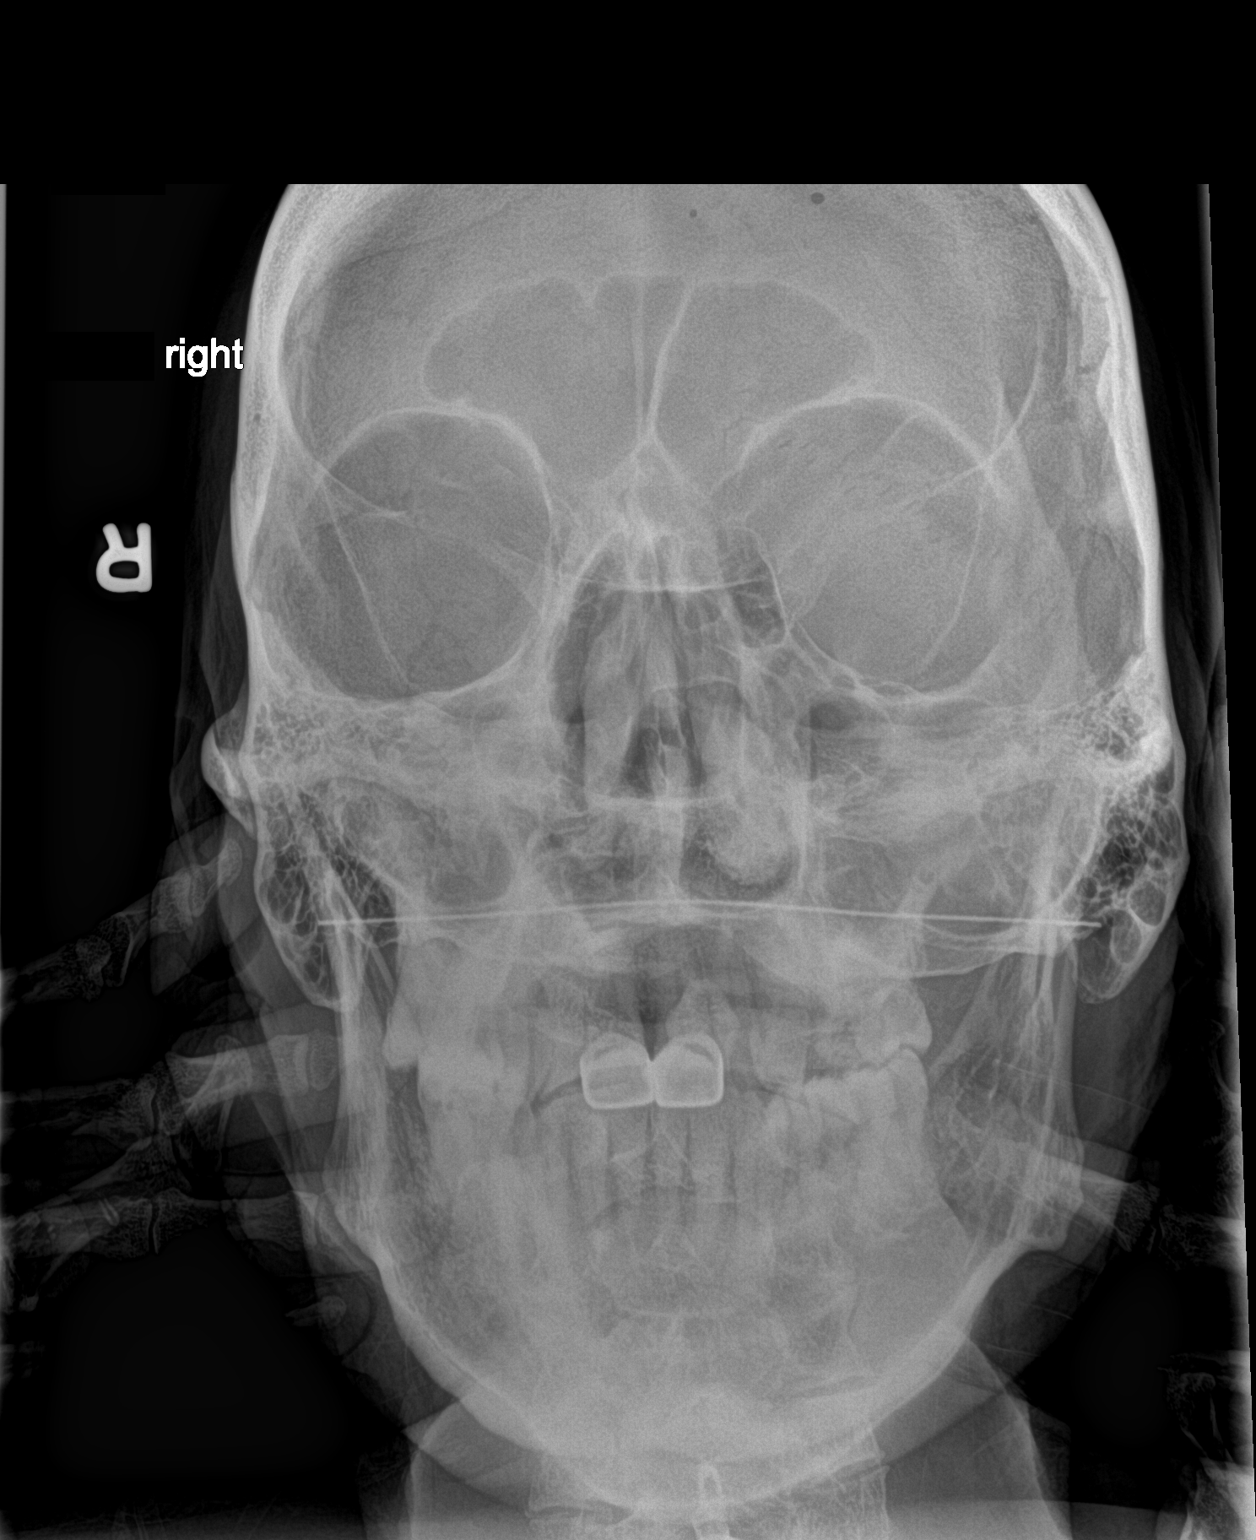

[skull waters (2 of 2)]
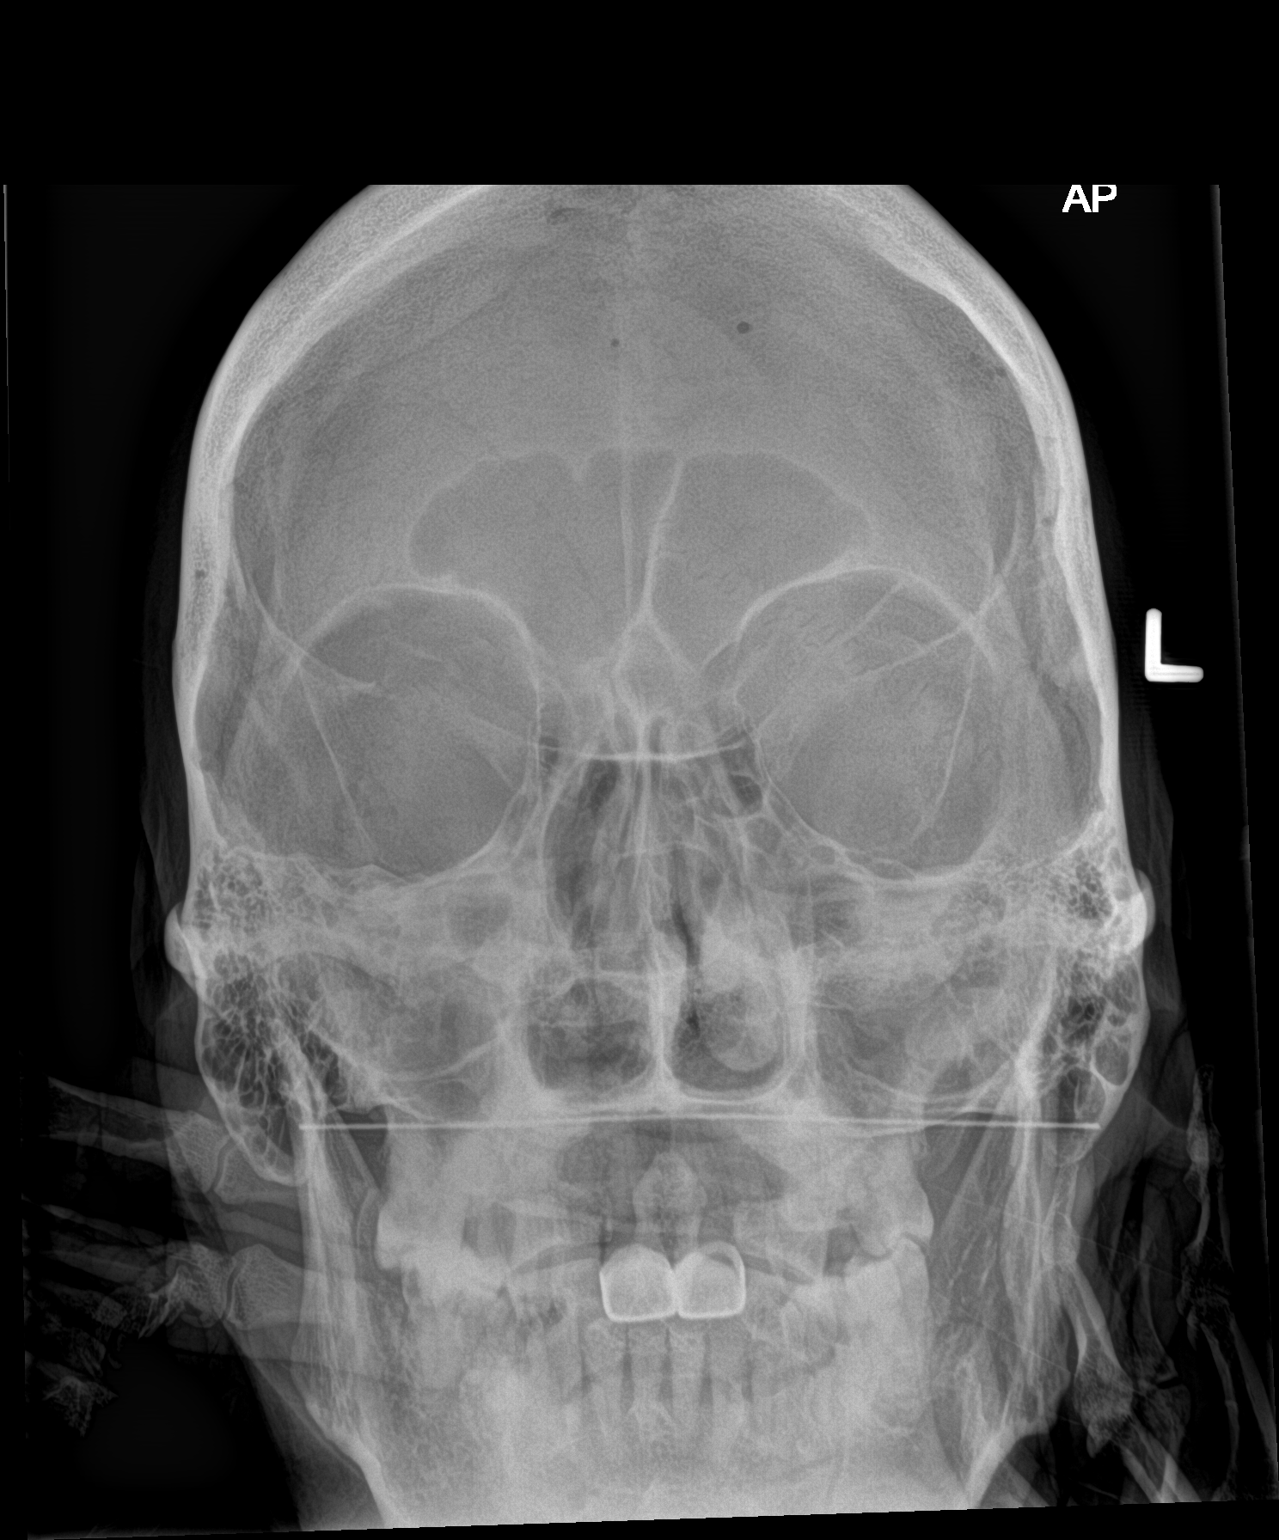

[2 of 2 positions shown; findings below may reference images not displayed]

FINDINGS: There is no evidence of metallic foreign body within the orbits. No
significant bone abnormality identified.
IMPRESSION: No evidence of metallic foreign body within the orbits.

## 2020-07-29 IMAGING — MR MR LUMBAR SPINE W/O CM
4 of 5 series · 26 of 48 positions shown · non-contrast
Comparison: None.

CLINICAL DATA: Low back pain with suspicion of spinal infection.
HIV disease.

EXAM:
MRI LUMBAR SPINE WITHOUT CONTRAST
TECHNIQUE: Multiplanar, multisequence MR imaging of the lumbar spine was
performed. No intravenous contrast was administered.

[Series 5: T2 · sagittal · 4.0mm · 0.73mm/px · 6 of 16 slices shown (1 of 2)]
[im 1/16]
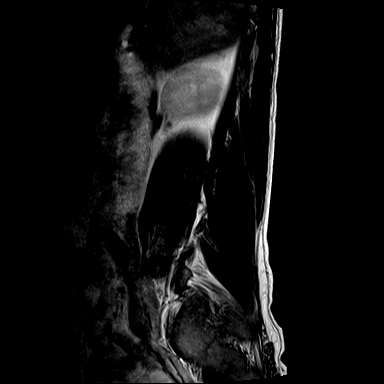
[im 4/16]
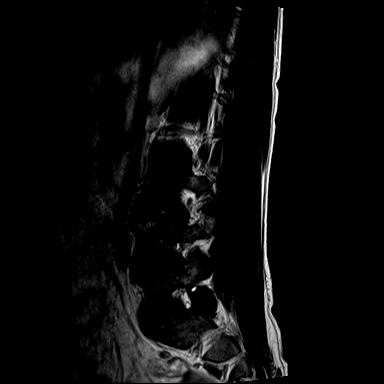
[im 7/16]
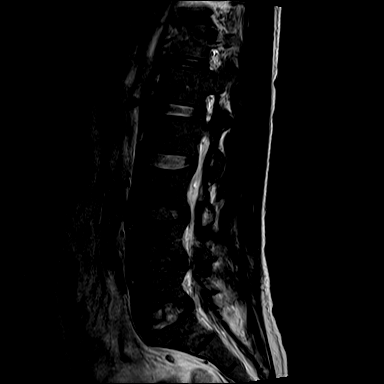
[im 10/16]
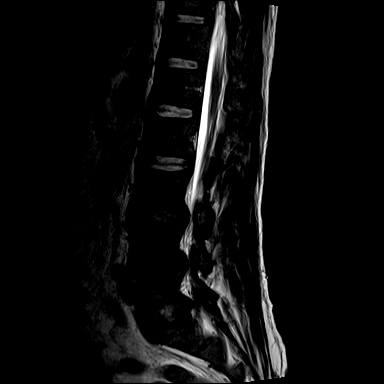
[im 13/16]
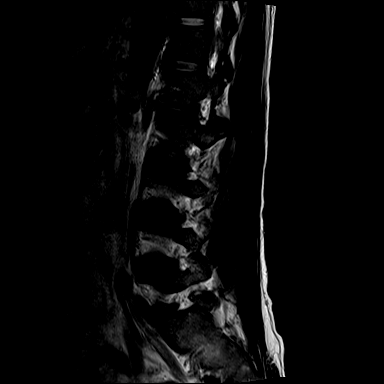
[im 16/16]
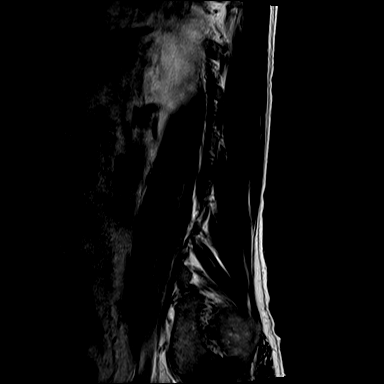

[Series 7: T1 · sagittal · 4.0mm · 0.88mm/px · 7 of 16 slices shown (1 of 2)]
[im 1/16]
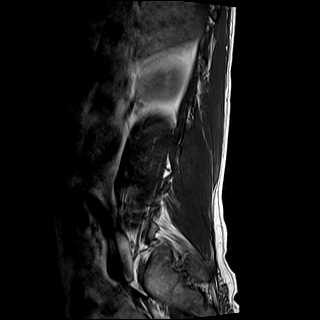
[im 3/16]
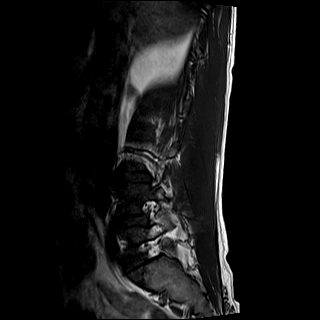
[im 6/16]
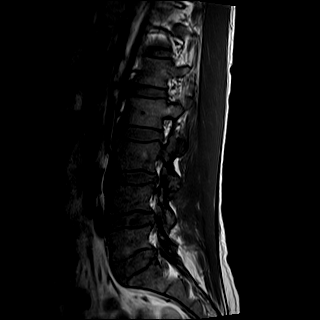
[im 8/16]
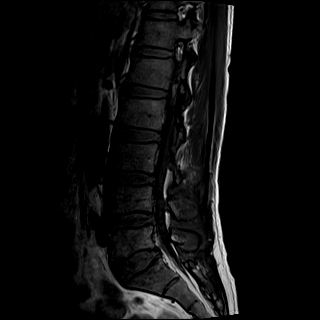
[im 11/16]
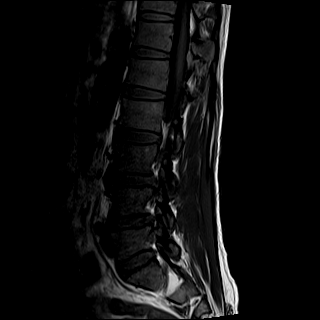
[im 13/16]
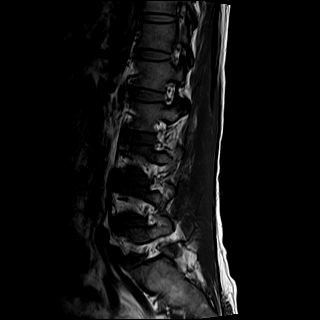
[im 16/16]
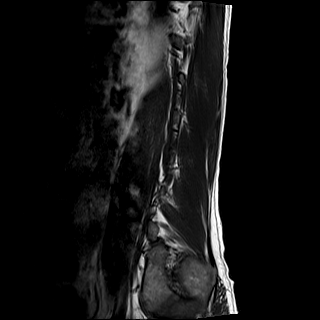

[Series 8: T2 · axial · 4.0mm · 0.57mm/px · z∈[-143,+50]mm · 8 of 32 slices shown (2 of 2)]
[im 1/32]
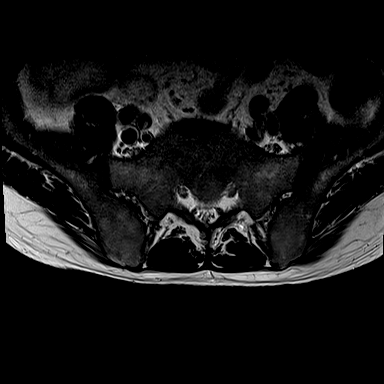
[im 5/32]
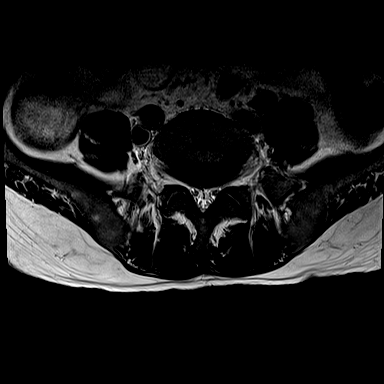
[im 10/32]
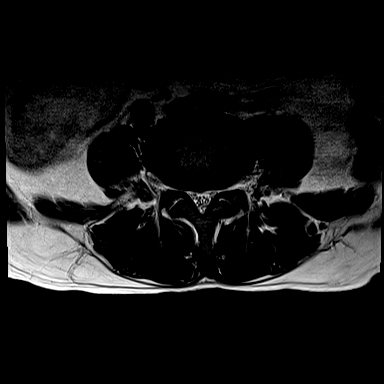
[im 15/32]
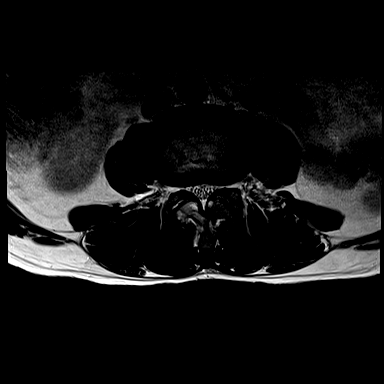
[im 17/32]
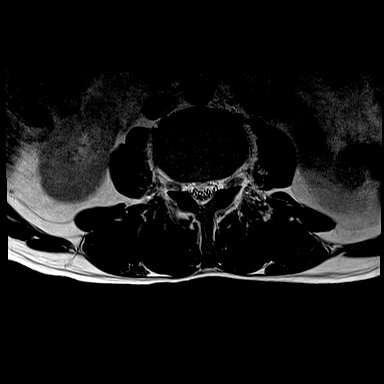
[im 22/32]
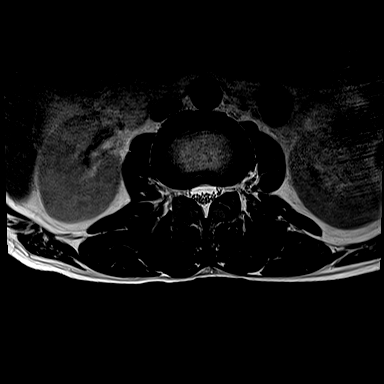
[im 27/32]
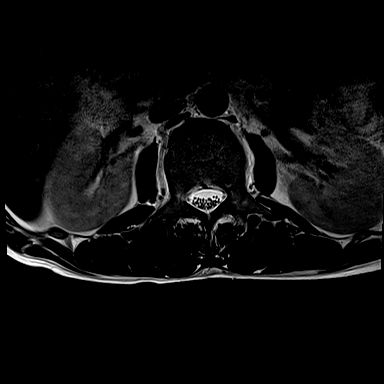
[im 32/32]
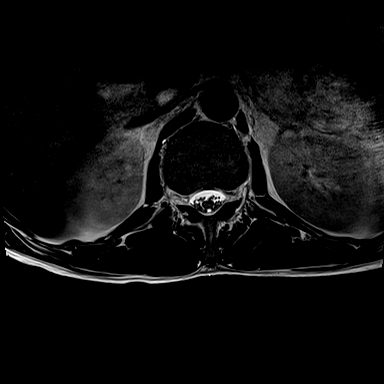

[Series 9: T1 · axial · 4.0mm · 0.34mm/px · z∈[-143,+26]mm · 5 of 32 slices shown (2 of 2)]
[im 1/32]
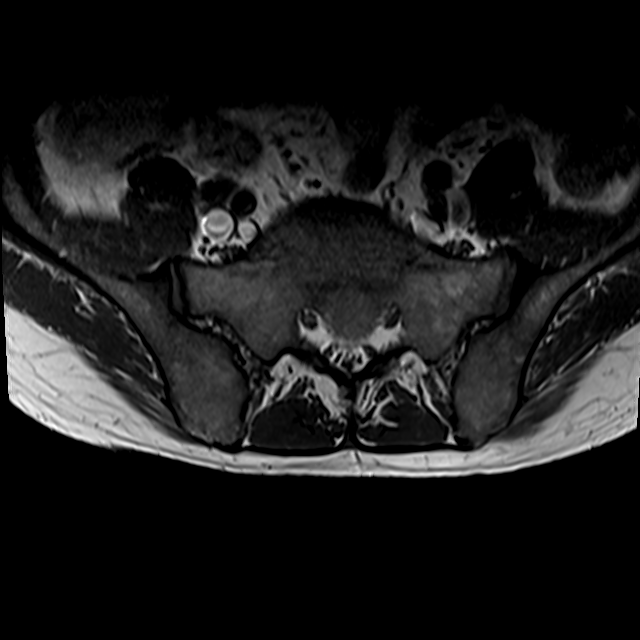
[im 5/32]
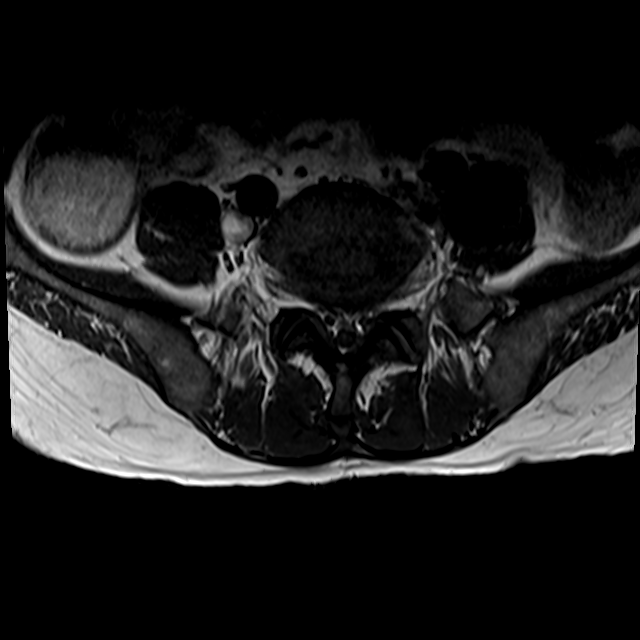
[im 10/32]
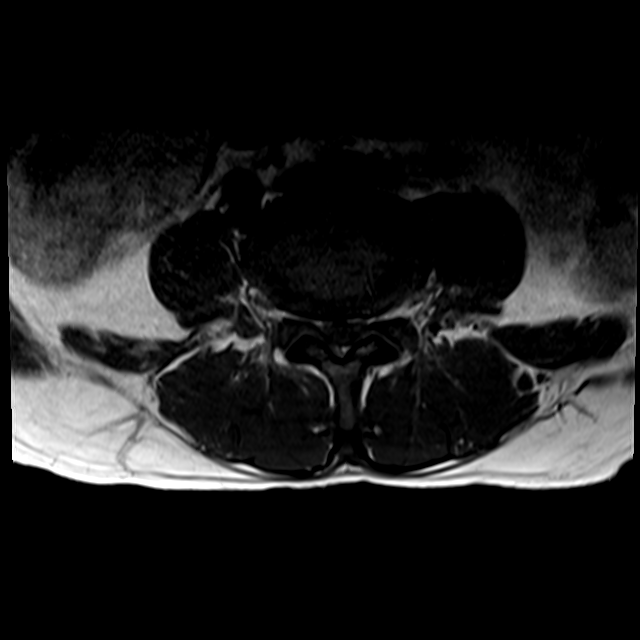
[im 17/32]
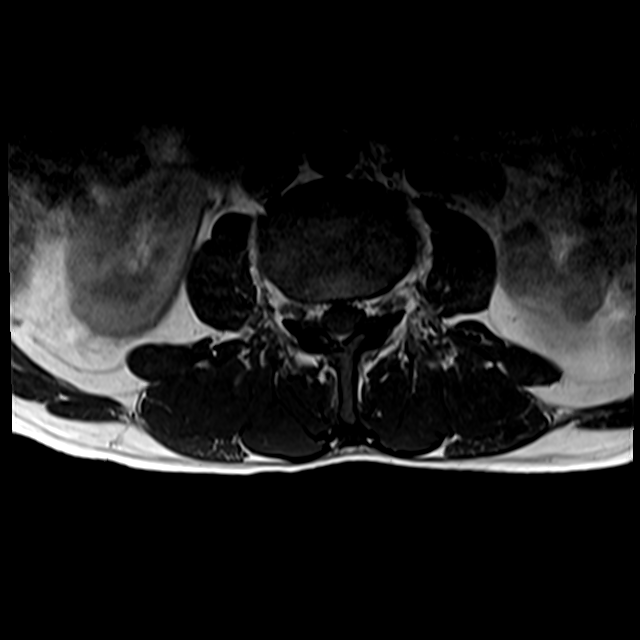
[im 27/32]
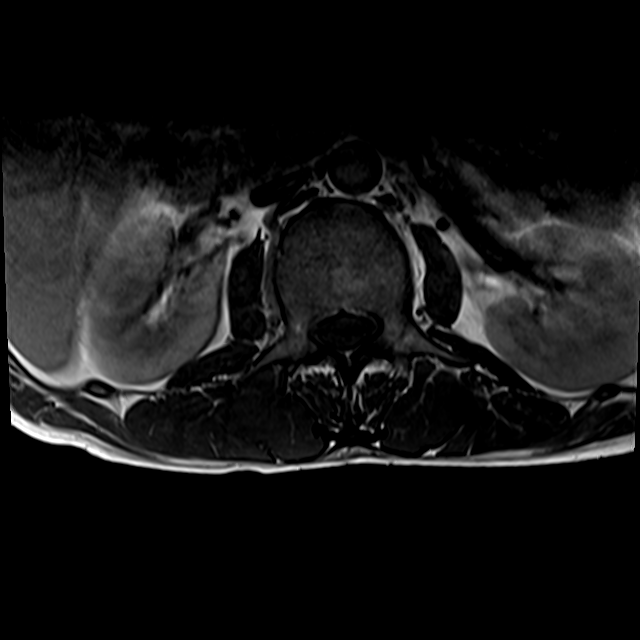

[26 of 48 positions shown; findings below may reference images not displayed]

FINDINGS: Segmentation:  5 lumbar type vertebral bodies.

Alignment:  Normal

Vertebrae: Slightly hypercellular marrow pattern but without
evidence of focal or active marrow space disease.

Conus medullaris and cauda equina: Conus extends to the L1-2 level.
Conus and cauda equina appear normal.

Paraspinal and other soft tissues: Negative

Disc levels:

No abnormality at L2-3 or above.

L3-4: Desiccation and mild bulging of the disc. Mild facet
degeneration with facet and ligamentous hypertrophy. 2 mm synovial
cyst on the left. Mild stenosis of the lateral recesses but without
definite neural compression. Findings at this level could relate to
back pain.

L4-5: Desiccation and bulging of the disc. Facet and ligamentous
hypertrophy. Mild stenosis of the canal and both lateral recesses.
Some potential for neural compression in the lateral recesses.
Findings at this level could certainly relate to low back pain.

L5-S1: Desiccation and bulging of the disc. Mild facet and
ligamentous hypertrophy. No compressive stenosis of the canal or
foramina. Findings at this level could relate to low back pain.

There is no finding on this exam that would specifically suggest
spinal infection
IMPRESSION: 1. No finding on this exam that would specifically suggest spinal
infection.
2. Fairly ordinary mild degenerative changes in the lower lumbar
spine which could contribute to back pain. At L3-4, L4-5 and L5-S1,
there is disc degeneration with mild bulging of the discs and facet
hypertrophy. There is a 2 mm synovial cyst on the left at L3-4. At
L3-4 and L4-5, there is mild stenosis of the lateral recesses and
foramina. At L4-5, there is some potential for neural compression in
the lateral recesses.

## 2020-07-29 IMAGING — MR MR HEAD WO/W CM
12 of 15 series · 32 of 48 positions shown · IV contrast (gadavist)
Comparison: None.

CLINICAL DATA: Headache. Nausea and vomiting. Fever. [8S]
pneumonia. History of HIV.

EXAM:
MRI HEAD WITHOUT AND WITH CONTRAST
TECHNIQUE: Multiplanar, multiecho pulse sequences of the brain and surrounding
structures were obtained without and with intravenous contrast.
CONTRAST:  6mL GADAVIST GADOBUTROL 1 MMOL/ML IV SOLN

[Series 2: DWI · axial · 3.0mm · 0.94mm/px · z∈[-94,+53]mm · 5 of 104 slices shown (1 of 4)]
[im 1/104]
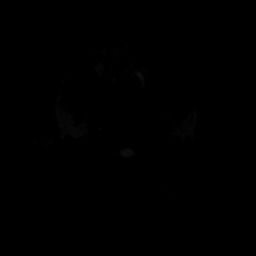
[im 26/104]
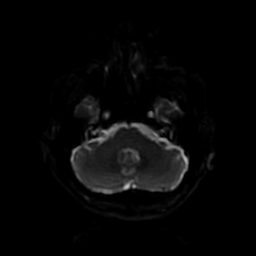
[im 52/104]
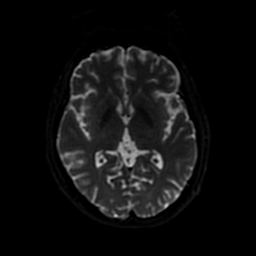
[im 78/104]
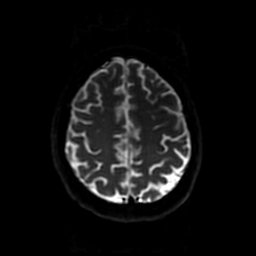
[im 104/104]
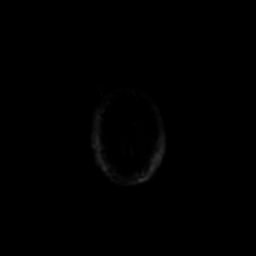

[Series 3: DWI · coronal · 4.0mm · 0.94mm/px · 3 of 74 slices shown (2 of 4)]
[im 1/74]
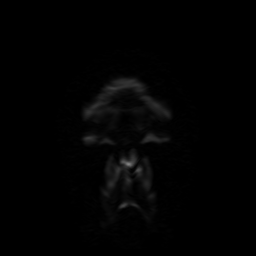
[im 37/74]
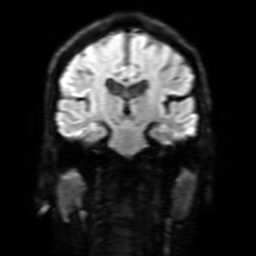
[im 74/74]
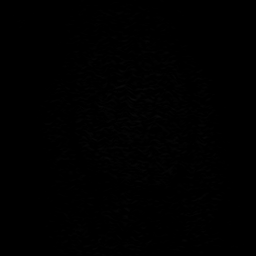

[Series 4: FLAIR · sagittal · 5.0mm · 0.47mm/px · 1 of 25 slices shown (1 of 2)]
[im 1/25]
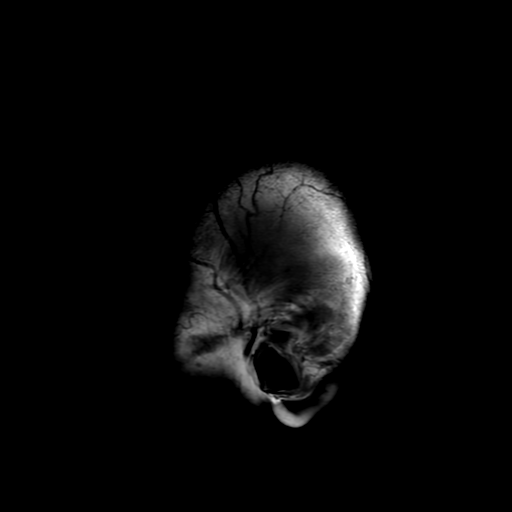

[Series 5: T2 · axial · 5.0mm · 0.47mm/px · 1 of 26 slices shown]
[im 1/26]
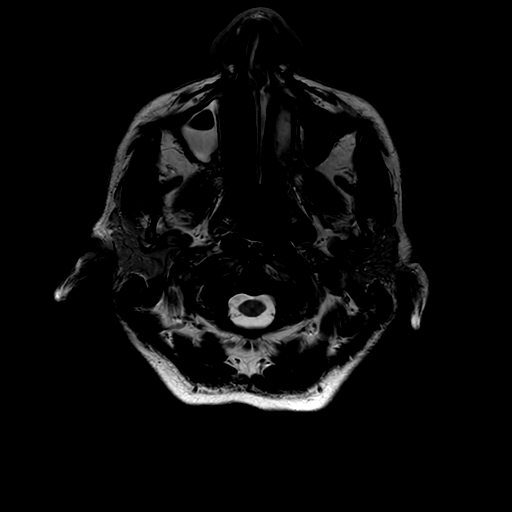

[Series 6: FLAIR · axial · 3.0mm · 0.45mm/px · 1 of 26 slices shown (2 of 2)]
[im 1/26]
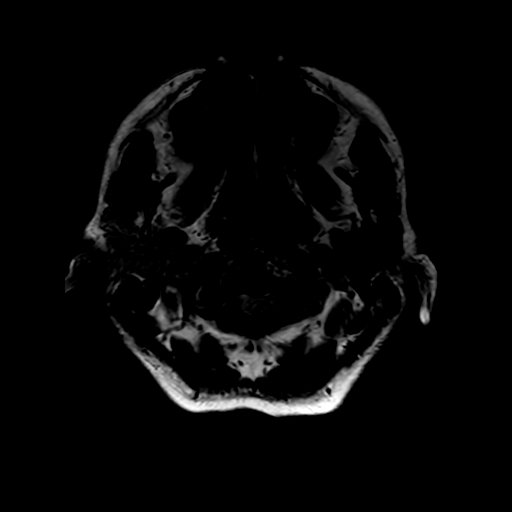

[Series 9: T2 post-contrast · coronal · 5.0mm · 0.20mm/px · 2 of 31 slices shown]
[im 1/31]
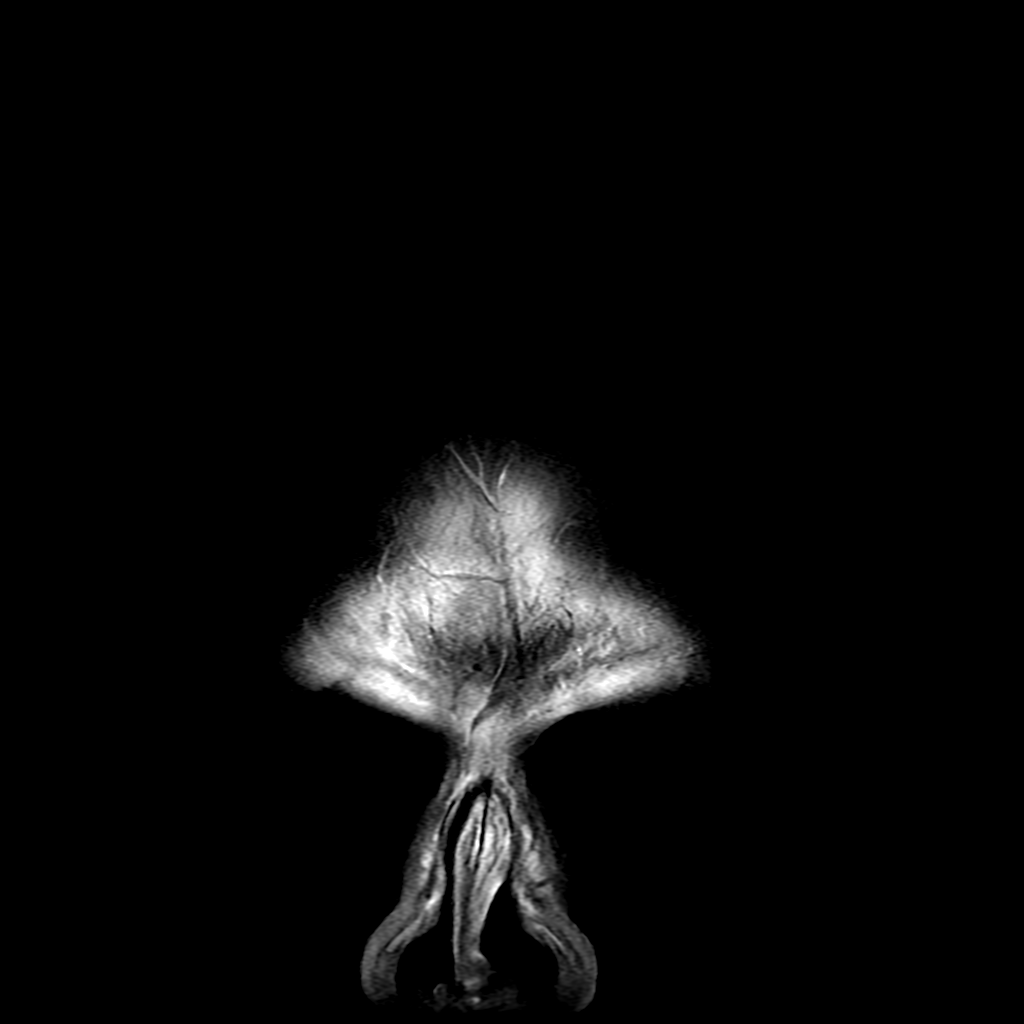
[im 31/31]
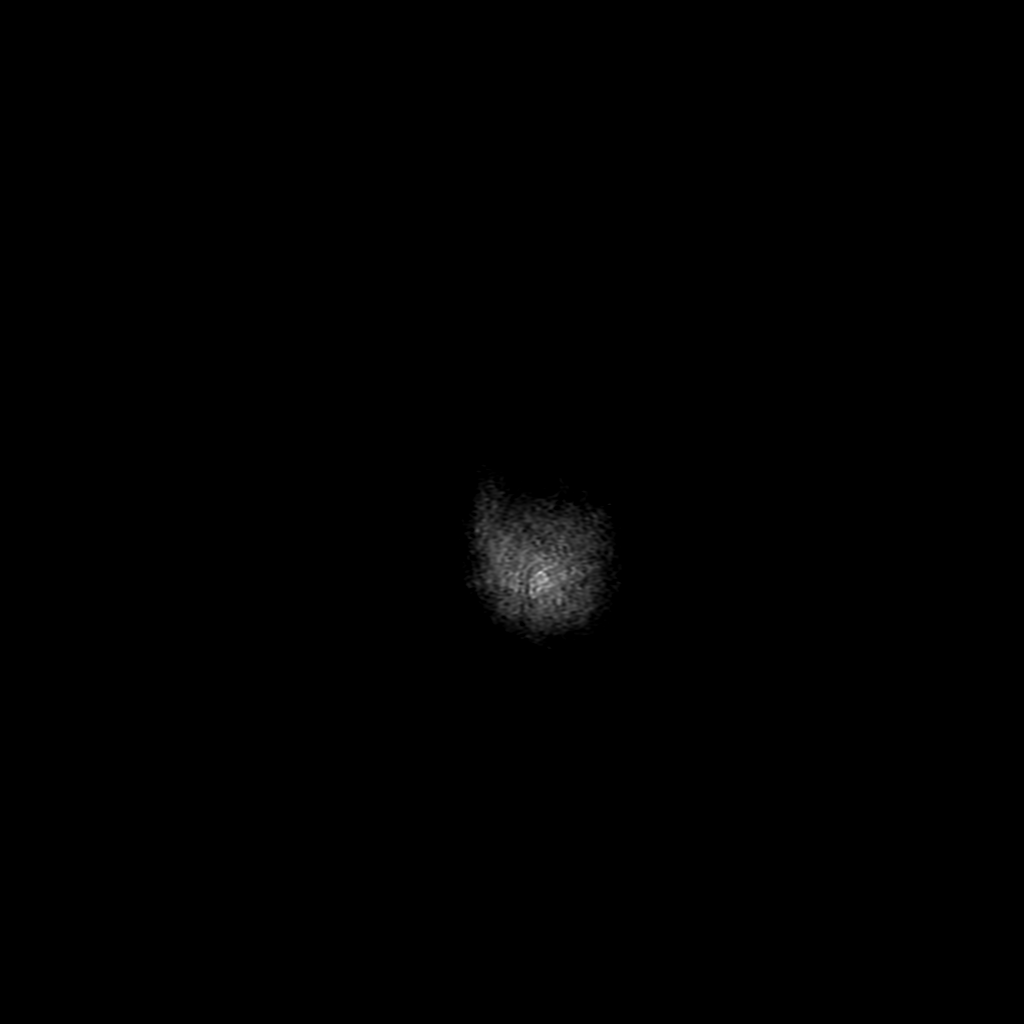

[Series 10: T1 · axial · 3.0mm · 0.94mm/px · z∈[-94,+53]mm · 3 of 52 slices shown (1 of 2)]
[im 1/52]
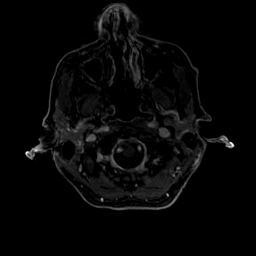
[im 26/52]
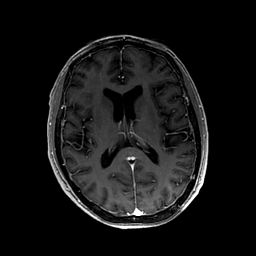
[im 52/52]
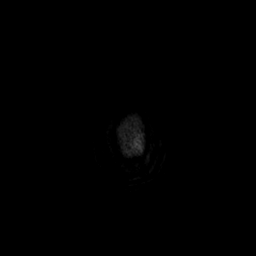

[Series 11: T1 · coronal · 5.0mm · 0.43mm/px · 1 of 31 slices shown (2 of 2)]
[im 1/31]
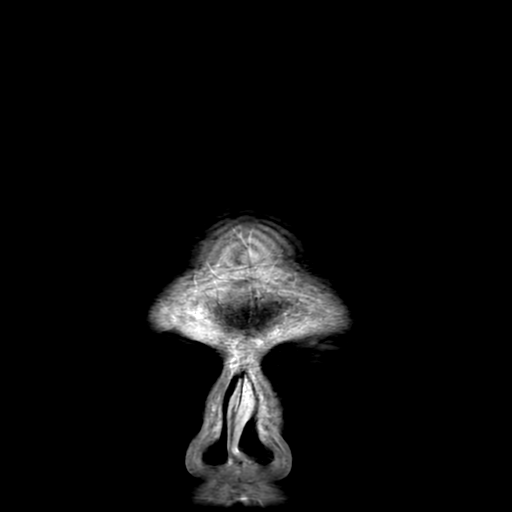

[Series 210: DWI · axial · 3.0mm · 0.94mm/px · z∈[-94,+53]mm · 6 of 104 slices shown (3 of 4)]
[im 1/104]
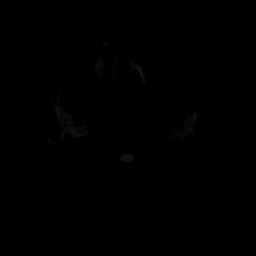
[im 21/104]
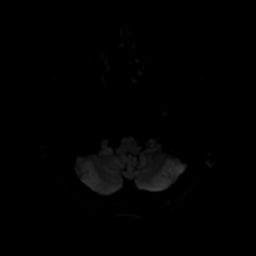
[im 42/104]
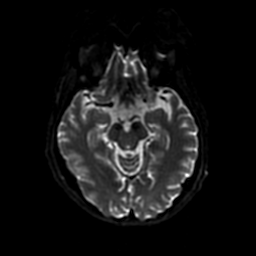
[im 62/104]
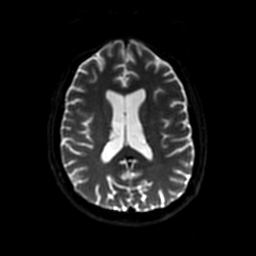
[im 83/104]
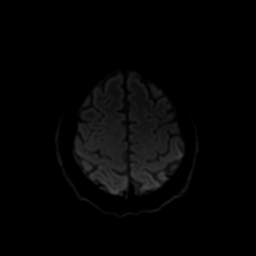
[im 104/104]
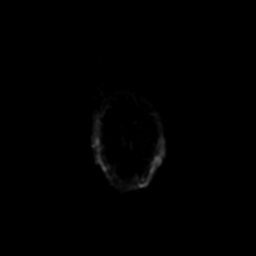

[Series 250: ADC · axial · 3.0mm · 0.94mm/px · z∈[-94,+53]mm · 3 of 52 slices shown (1 of 2)]
[im 1/52]
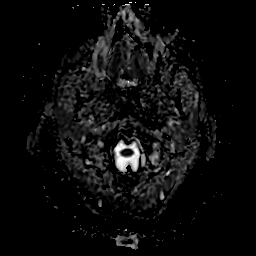
[im 26/52]
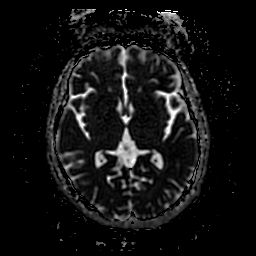
[im 52/52]
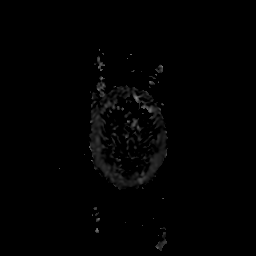

[Series 310: DWI · coronal · 4.0mm · 0.94mm/px · 4 of 74 slices shown (4 of 4)]
[im 1/74]
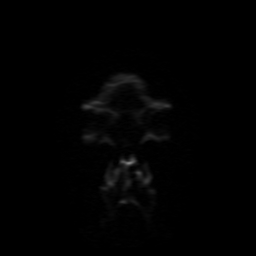
[im 25/74]
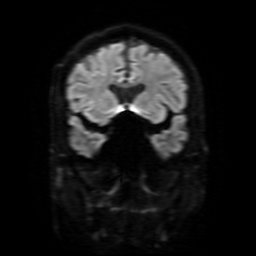
[im 49/74]
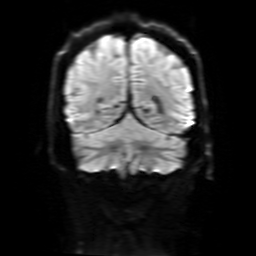
[im 74/74]
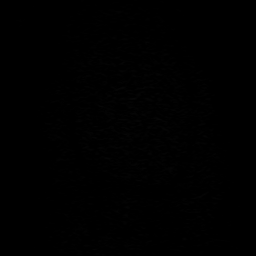

[Series 350: ADC · coronal · 4.0mm · 0.94mm/px · 2 of 36 slices shown (2 of 2)]
[im 1/36]
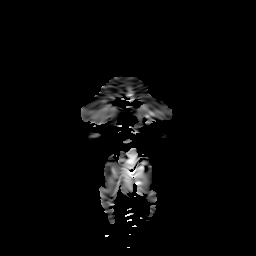
[im 36/36]
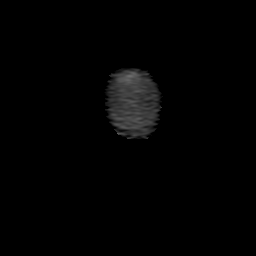

[32 of 48 positions shown; findings below may reference images not displayed]

FINDINGS: Brain: There is no evidence of an acute infarct, intracranial
hemorrhage, mass, midline shift, or extra-axial fluid collection.
The ventricles and sulci are borderline prominent for age. The brain
is normal in signal. No abnormal enhancement is identified.

Vascular: Major intracranial vascular flow voids are preserved.

Skull and upper cervical spine: Unremarkable bone marrow signal.

Sinuses/Orbits: Unremarkable orbits. Fluid in the right maxillary
sinus. Clear mastoid air cells.

Other: None.
IMPRESSION: 1. Unremarkable appearance of the brain aside from borderline age
advanced cerebral atrophy.
2. Right maxillary sinus fluid, correlate for acute sinusitis.

## 2020-07-29 MED ORDER — PREDNISONE 20 MG PO TABS
20.0000 mg | ORAL_TABLET | Freq: Every day | ORAL | Status: DC
  Administered 2020-07-29: 20 mg via ORAL
  Filled 2020-07-29: qty 1

## 2020-07-29 MED ORDER — MORPHINE SULFATE (PF) 2 MG/ML IV SOLN
1.0000 mg | Freq: Once | INTRAVENOUS | Status: AC
  Administered 2020-07-29: 1 mg via INTRAVENOUS
  Filled 2020-07-29: qty 1

## 2020-07-29 MED ORDER — GADOBUTROL 1 MMOL/ML IV SOLN
6.0000 mL | Freq: Once | INTRAVENOUS | Status: AC | PRN
  Administered 2020-07-29: 6 mL via INTRAVENOUS

## 2020-07-29 MED ORDER — ENOXAPARIN SODIUM 40 MG/0.4ML ~~LOC~~ SOLN
40.0000 mg | SUBCUTANEOUS | Status: DC
  Administered 2020-07-29 – 2020-07-30 (×2): 40 mg via SUBCUTANEOUS
  Filled 2020-07-29 (×2): qty 0.4

## 2020-07-29 NOTE — Progress Notes (Signed)
PROGRESS NOTE                                                                                                                                                                                                             Patient Demographics:    [facility], is a 45 y.o. male, DOB - [birth year], SWN:[id number]  Outpatient Primary MD for the patient is Patient, No Pcp Per   Admit date - 07/24/2020   LOS - 5  Chief Complaint  Patient presents with  . Fever  . Generalized Body Aches       Brief Narrative: Patient is a 45 y.o. male with PMHx of HIV-noncompliant with ART-presented to the hospital with 1 week history of cough/fevers and myalgias.  Found to have COVID-19 infection and subsequently admitted to the hospitalist service.  See below for further details  COVID-19 vaccinated status: Unvaccinated  Significant Events: 2/15>> Admit to [facility] for possible COVID-19 pneumonia  Significant studies: 2/15>>Chest x-ray: Chronic bronchiectasis/nodularity in the right LLL 2/17>>CD4 <35  COVID-19 medications: Steroids: 2/15>> 2/20 Remdesivir: 2/15>>2/19  Antibiotics: Rocephin: 2/15>>2/19 Zithromax: 2/15>>2/19  Microbiology data: 2/15 >>blood culture: No growth 2/19>> CSF culture: No growth 2/19>> cryptococcal antigen CSF: Negative 2/19>> VDRL CSF: Pending  Procedures: 2/19>> LP by IR  Consults: Neurology, ID  DVT prophylaxis: Restart prophylactic Lovenox    Subjective:   Headaches better.   Assessment  & Plan :   COVID-19 infection-possible RLL bacterial pneumonia: Improved-on room air-has completed a course of antimicrobial therapy with Remdesivir/Zithromax/Rocephin.  No longer on steroids.  Fever: afebrile O2 requirements: Room air. SpO2: 94 %   COVID-19 Labs: Recent Labs    07/27/20 0114 07/28/20 0331 07/29/20 0134  DDIMER <0.27 <0.27 <0.27  CRP 3.0* 2.4* 1.9*    No results found  for: BNP  No results for input(s): PROCALCITON in the last 168 hours.  Lab Results  Component Value Date   SARSCOV2NAA POSITIVE (A) 07/24/2020   SARSCOV2NAA NEGATIVE 03/13/2019   SARSCOV2NAA NEGATIVE 12/05/2018     Prone/Incentive Spirometry: encouraged incentive spirometry use 3-4/hour.  Headache: Initially attributed to acute illness/fever/Covid infection-however apparently going on for approximately a month per patient-CSF studies unremarkable so far-negative for cryptococcus infection.  Will go ahead and obtain a MRI brain today.  HIV/AIDS: Noncompliant with antiretrovirals (claims he had dizziness)-on Bactrim/Zithromax for prophylaxis. ID  following-with plans to restart ART's once cryptococcal meningitis ruled out.  Oral thrush: On fluconazole  History of alcohol: No withdrawal symptoms  GI prophylaxis: PP  ABG:    Component Value Date/Time   PHART 7.455 (H) 03/16/2015 0133   PCO2ART 30.0 (L) 03/16/2015 0133   PO2ART 80.0 03/16/2015 0133   HCO3 21.1 03/16/2015 0133   TCO2 22 03/16/2015 0133   ACIDBASEDEF 2.0 03/16/2015 0133   O2SAT 97.0 03/16/2015 0133    Vent Settings: N/A   Condition -Stable  Family Communication  : Patient will update family himself.  Code Status :  Full Code  Diet :  Diet Order            Diet regular Room service appropriate? Yes; Fluid consistency: Thin  Diet effective now                  Disposition Plan  :   Status is: Inpatient  Remains inpatient appropriate because:Inpatient level of care appropriate due to severity of illness   Dispo: The patient is from: Home              Anticipated d/c is to: Home              Anticipated d/c date is: 3 days              Patient currently is not medically stable to d/c.   Difficult to place patient No   Barriers to discharge: complete 5 days of IV Remdesivir  Antimicorbials  :    Anti-infectives (From admission, onward)   Start     Dose/Rate Route Frequency Ordered Stop    08/04/20 1000  azithromycin (ZITHROMAX) tablet 1,200 mg        1,200 mg Oral Weekly 07/25/20 1049     07/27/20 1745  fluconazole (DIFLUCAN) tablet 200 mg        200 mg Oral Daily 07/27/20 1653     07/27/20 1745  bictegravir-emtricitabine-tenofovir AF (BIKTARVY) 50-200-25 MG per tablet 1 tablet  Status:  Discontinued        1 tablet Oral Daily 07/27/20 1654 07/27/20 1720   07/27/20 1730  fluconazole (DIFLUCAN) tablet 200 mg  Status:  Discontinued        200 mg Oral Daily 07/27/20 1632 07/27/20 1634   07/26/20 1800  azithromycin (ZITHROMAX) tablet 500 mg        500 mg Oral Daily-1800 07/26/20 1107 07/28/20 1701   07/25/20 1200  sulfamethoxazole-trimethoprim (BACTRIM) 400-80 MG per tablet 1 tablet        1 tablet Oral Daily 07/25/20 1047     07/25/20 1000  remdesivir 100 mg in sodium chloride 0.9 % 100 mL IVPB       "Followed by" Linked Group Details   100 mg 200 mL/hr over 30 Minutes Intravenous Daily 07/24/20 1841 07/28/20 0909   07/24/20 1845  remdesivir 200 mg in sodium chloride 0.9% 250 mL IVPB       "Followed by" Linked Group Details   200 mg 580 mL/hr over 30 Minutes Intravenous Once 07/24/20 1841 07/24/20 2203   07/24/20 1845  azithromycin (ZITHROMAX) 500 mg in sodium chloride 0.9 % 250 mL IVPB  Status:  Discontinued        500 mg 250 mL/hr over 60 Minutes Intravenous Every 24 hours 07/24/20 1841 07/26/20 1107   07/24/20 1845  cefTRIAXone (ROCEPHIN) 1 g in sodium chloride 0.9 % 100 mL IVPB  Status:  Discontinued  1 g 200 mL/hr over 30 Minutes Intravenous Every 24 hours 07/24/20 1841 07/24/20 1845   07/24/20 1645  cefTRIAXone (ROCEPHIN) 2 g in sodium chloride 0.9 % 100 mL IVPB        2 g 200 mL/hr over 30 Minutes Intravenous Every 24 hours 07/24/20 1644 07/28/20 1736   07/24/20 1645  azithromycin (ZITHROMAX) tablet 500 mg  Status:  Discontinued        500 mg Oral Daily 07/24/20 1644 07/24/20 1846   07/24/20 1630  levofloxacin (LEVAQUIN) IVPB 750 mg  Status:  Discontinued         750 mg 100 mL/hr over 90 Minutes Intravenous  Once 07/24/20 1619 07/24/20 1642      Inpatient Medications  Scheduled Meds: . vitamin C  500 mg Oral Daily  . [START ON 08/04/2020] azithromycin  1,200 mg Oral Weekly  . fluconazole  200 mg Oral Daily  . pantoprazole  40 mg Oral Q1200  . predniSONE  20 mg Oral Q breakfast  . sulfamethoxazole-trimethoprim  1 tablet Oral Daily  . zinc sulfate  220 mg Oral Daily   Continuous Infusions: . sodium chloride 100 mL/hr at 07/25/20 2348   PRN Meds:.acetaminophen, guaiFENesin-dextromethorphan, ondansetron **OR** ondansetron (ZOFRAN) IV   Time Spent in minutes  25  See all Orders from today for further details   Jeoffrey Massed M.D on 07/29/2020 at 11:20 AM  To page go to [email or web address] - use universal password  [facility] -  Office  [phone]    Objective:   Vitals:   07/28/20 2130 07/28/20 2338 07/29/20 0432 07/29/20 0741  BP:  107/74 100/64 99/67  Pulse:  67  74  Resp:  Temp:  97.9 F (36.6 C) 97.8 F (36.6 C) (!) 97.5 F (36.4 C)  TempSrc:  Axillary Axillary Oral  SpO2: 93% 96% 97% 94%  Weight:      Height:        Wt Readings from Last 3 Encounters:  07/24/20 64.3 kg  04/25/19 63 kg  03/30/19 62.3 kg     Intake/Output Summary (Last 24 hours) at 07/29/2020 1120 Last data filed at 07/29/2020 0813 Gross per 24 hour  Intake 580 ml  Output 950 ml  Net -370 ml     Physical Exam Gen Exam:Alert awake-not in any distress HEENT:atraumatic, normocephalic Chest: B/L clear to auscultation anteriorly CVS:S1S2 regular Abdomen:soft non tender, non distended Extremities:no edema Neurology: Non focal Skin: no rash  Data Review:    CBC Recent Labs  Lab 07/25/20 0129 07/26/20 0027 07/27/20 0114 07/28/20 0331 07/29/20 0134  WBC 13.6* 11.4* 9.9 8.2 8.2  HGB 11.5* 11.6* 12.6* 13.1 13.3  HCT 33.0* 34.8* 38.4* 39.1 40.7  PLT 308 313 308 308 298  MCV 75.7* 77.0* 77.7* 76.7* 76.5*  MCH 26.4 25.7*  25.5* 25.7* 25.0*  MCHC 34.8 33.3 32.8 33.5 32.7  RDW 13.4 13.3 13.2 13.2 13.3  LYMPHSABS 1.1 2.2 1.8 1.5 2.4  MONOABS 0.3 0.5 0.9 0.8 0.8  EOSABS 0.0 0.1 1.0* 1.0* 0.9*  BASOSABS 0.0 0.0 0.0 0.0 0.0    Chemistries  Recent Labs  Lab 07/25/20 0129 07/26/20 0027 07/27/20 0114 07/28/20 0331 07/29/20 0134  NA 133* 132* 132* 131* 131*  K 3.5 4.0 3.8 3.7 3.7  CL 101 103 98 98 99  CO2 21* 21* GLUCOSE 162* 143* 99 98 103*  BUN CREATININE 0.63 0.65 0.73 0.64 0.65  CALCIUM 8.2* 8.2*  8.4* 8.4* 8.7*  MG 1.6* 1.9 1.9 1.9 2.1  AST 19 14* 31 56* 30  ALT 17 15 27  48* 56*  ALKPHOS 80 61 67 82 83  BILITOT 0.8 0.6 0.8 0.8 1.0   ------------------------------------------------------------------------------------------------------------------ No results for input(s): CHOL, HDL, LDLCALC, TRIG, CHOLHDL, LDLDIRECT in the last 72 hours.  Lab Results  Component Value Date   HGBA1C 6.3 (H) 03/16/2015   ------------------------------------------------------------------------------------------------------------------ No results for input(s): TSH, T4TOTAL, T3FREE, THYROIDAB in the last 72 hours.  Invalid input(s): FREET3 ------------------------------------------------------------------------------------------------------------------ No results for input(s): VITAMINB12, FOLATE, FERRITIN, TIBC, IRON, RETICCTPCT in the last 72 hours.  Coagulation profile Recent Labs  Lab 07/24/20 1618  INR 1.2    Recent Labs    07/28/20 0331 07/29/20 0134  DDIMER <0.27 <0.27    Cardiac Enzymes No results for input(s): CKMB, TROPONINI, MYOGLOBIN in the last 168 hours.  Invalid input(s): CK ------------------------------------------------------------------------------------------------------------------ No results found for: BNP  Micro Results Recent Results (from the past 240 hour(s))  Resp Panel by RT-PCR (Flu A&B, Covid) Nasopharyngeal Swab     Status: Abnormal    Collection Time: 07/24/20  2:18 PM   Specimen: Nasopharyngeal Swab; Nasopharyngeal(NP) swabs in vial transport medium  Result Value Ref Range Status   SARS Coronavirus 2 by RT PCR POSITIVE (A) NEGATIVE Final    Comment: RESULT CALLED TO, READ BACK BY AND VERIFIED WITH: RN DSP [id number] 07/26/20 FCP (NOTE) SARS-CoV-2 target nucleic acids are DETECTED.  The SARS-CoV-2 RNA is generally detectable in upper respiratory specimens during the acute phase of infection. Positive results are indicative of the presence of the identified virus, but do not rule out bacterial infection or co-infection with other pathogens not detected by the test. Clinical correlation with patient history and other diagnostic information is necessary to determine patient infection status. The expected result is Negative.  Fact Sheet for Patients: 536144  Fact Sheet for Healthcare Providers: BloggerCourse.com  This test is not yet approved or cleared by the [employer] FDA and  has been authorized for detection and/or diagnosis of SARS-CoV-2 by FDA under an Emergency Use Authorization (EUA).  This EUA will remain in effect (meaning this test can be used) for  the duration of  the COVID-19 declaration under Section 564(b)(1) of the Act, 21 U.S.C. section 360bbb-3(b)(1), unless the authorization is terminated or revoked sooner.     Influenza A by PCR NEGATIVE NEGATIVE Final   Influenza B by PCR NEGATIVE NEGATIVE Final    Comment: (NOTE) The Xpert Xpress SARS-CoV-2/FLU/RSV plus assay is intended as an aid in the diagnosis of influenza from Nasopharyngeal swab specimens and should not be used as a sole basis for treatment. Nasal washings and aspirates are unacceptable for Xpert Xpress SARS-CoV-2/FLU/RSV testing.  Fact Sheet for Patients: [place]  Fact Sheet for Healthcare  Providers: BloggerCourse.com  This test is not yet approved or cleared by the [employer] FDA and has been authorized for detection and/or diagnosis of SARS-CoV-2 by FDA under an Emergency Use Authorization (EUA). This EUA will remain in effect (meaning this test can be used) for the duration of the COVID-19 declaration under Section 564(b)(1) of the Act, 21 U.S.C. section 360bbb-3(b)(1), unless the authorization is terminated or revoked.  Performed at [facility], [address] [place], [place]   Blood Culture (routine x 2)     Status: None   Collection Time: 07/24/20  4:30 PM   Specimen: BLOOD  Result Value Ref Range Status   Specimen Description BLOOD  SITE NOT SPECIFIED  Final   Special Requests   Final    BOTTLES DRAWN AEROBIC AND ANAEROBIC Blood Culture adequate volume   Culture   Final    NO GROWTH 5 DAYS Performed at [facility], [address], [place], [place] [address]    Report Status 07/29/2020 FINAL  Final  Blood Culture (routine x 2)     Status: None   Collection Time: 07/24/20  4:45 PM   Specimen: BLOOD  Result Value Ref Range Status   Specimen Description BLOOD SITE NOT SPECIFIED  Final   Special Requests   Final    BOTTLES DRAWN AEROBIC AND ANAEROBIC Blood Culture adequate volume   Culture   Final    NO GROWTH 5 DAYS Performed at [facility], [address] [place], [place] [address]    Report Status 07/29/2020 FINAL  Final  MRSA PCR Screening     Status: None   Collection Time: 07/24/20  9:30 PM   Specimen: Nasal Mucosa; Nasopharyngeal  Result Value Ref Range Status   MRSA by PCR NEGATIVE NEGATIVE Final    Comment:        The GeneXpert MRSA Assay (FDA approved for NASAL specimens only), is one component of a comprehensive MRSA colonization surveillance program. It is not intended to diagnose MRSA infection nor to guide or monitor treatment for MRSA infections. Performed at [facility], [address], [place], [place] [address]   CSF culture w Gram Stain     Status: None (Preliminary result)   Collection Time: 07/28/20 12:10 PM   Specimen: PATH Cytology CSF; Cerebrospinal Fluid  Result Value Ref Range Status   Specimen Description CSF  Final   Special Requests NONE  Final   Gram Stain   Final    WBC PRESENT,BOTH PMN AND MONONUCLEAR NO ORGANISMS SEEN CYTOSPIN SMEAR    Culture   Final    NO GROWTH < 24 HOURS Performed at [facility], [address] [place], [place] [address]    Report Status PENDING  Incomplete    Radiology Reports DG Eye Foreign Body  Result Date: 07/29/2020 CLINICAL DATA:  45 year old male pending MRI.  History of metal work EXAM: ORBITS FOR FOREIGN BODY - 2 VIEW COMPARISON:  None. FINDINGS: There is no evidence of metallic foreign body within the orbits. No significant bone abnormality identified. IMPRESSION: No evidence of metallic foreign body within the orbits. Electronically Signed   By: Gilmer Mor D.O.   On: 07/29/2020 10:56   DG Chest Port 1 View  Result Date: 07/24/2020 CLINICAL DATA:  COVID positive.  Body aches and fever.  Febrile EXAM: PORTABLE CHEST 1 VIEW COMPARISON:  03/13/2019 radiograph, CT 03/15/2019 FINDINGS: Normal cardiac silhouette. There is chronic nodular opacity in the RIGHT lower lobe similar to comparison CT and radiograph. LEFT lung is clear. No pneumothorax. IMPRESSION: Chronic bronchiectasis and nodularity in the RIGHT lower lobe consistent with chronic or recurrent pulmonary infection. Consider aspiration and fungal infections. Electronically Signed   By: Genevive Bi M.D.   On: 07/24/2020 17:01   DG FLUORO GUIDE LUMBAR PUNCTURE  Result Date: 07/28/2020 CLINICAL DATA:  Fluoroscopic leak guided LP EXAM: DIAGNOSTIC LUMBAR PUNCTURE UNDER FLUOROSCOPIC GUIDANCE FLUOROSCOPY TIME:  Fluoroscopy Time:  2.9 minutes Number of Acquired Spot Images: 0 PROCEDURE: Informed consent was obtained from the patient  prior to the procedure, including potential complications of headache, allergy, and pain. With the patient prone, the lower back was  prepped with Betadine. 1% Lidocaine was used for local anesthesia. Lumbar puncture was performed at the L4-5 level using a 20 gauge needle with return of slightly pink tinged clear CSF. Sixteen ml of CSF were obtained for laboratory studies. The CSF was completely clear by the end of collection. The patient tolerated the procedure well and there were no apparent complications. Of note, this was a difficult lumbar puncture. CSF was finally obtained at the third attempted level. The difficulty was likely due to low CSF pressure. Significant tilting of the table and rolling the patient into a left lateral decubitus position was necessary to obtain the fluid. The patient did have radiculopathy intermittently throughout the study. The radiculopathy dissipated shortly after completion of the study. IMPRESSION: Difficult but successful lumbar puncture. See above for details. 16 cc of fluid was sent to the lab. The first 2 vials of fluid demonstrated slightly pink tinged clear color. The last 2 vials were clear to visualization. Electronically Signed   By: Gerome Sam III M.D   On: 07/28/2020 12:59

## 2020-07-29 NOTE — Progress Notes (Signed)
ID Brief Note  Patient underwent LT with IR yestetday  CSF results reviewed. Cryptococcal ag negative, RBC 1,950, WBC 8, G 42, Protein 40 Serum crypto ag and serum RPR negative CSF cultures pending with no organisms in Gram stain   Recommend MRI brain for ruling out space occupying lesions given profound AIDs with low Cd4 count   Dr Drue Second will be back tomorrow.   Odette Fraction, MD Infectious Diseases [facility]

## 2020-07-29 NOTE — Progress Notes (Signed)
Patient is going for an MRI of the head this morning. Using the Language Service patient was educated about the MRI exam and determined that he is not claustrophobic.

## 2020-07-30 ENCOUNTER — Other Ambulatory Visit: Payer: Self-pay | Admitting: Internal Medicine

## 2020-07-30 DIAGNOSIS — Z7289 Other problems related to lifestyle: Secondary | ICD-10-CM

## 2020-07-30 DIAGNOSIS — U071 COVID-19: Secondary | ICD-10-CM | POA: Diagnosis not present

## 2020-07-30 DIAGNOSIS — J019 Acute sinusitis, unspecified: Secondary | ICD-10-CM

## 2020-07-30 DIAGNOSIS — A419 Sepsis, unspecified organism: Secondary | ICD-10-CM

## 2020-07-30 DIAGNOSIS — A4189 Other specified sepsis: Secondary | ICD-10-CM | POA: Diagnosis not present

## 2020-07-30 DIAGNOSIS — J159 Unspecified bacterial pneumonia: Secondary | ICD-10-CM

## 2020-07-30 MED ORDER — DARUN-COBIC-EMTRICIT-TENOFAF 800-150-200-10 MG PO TABS: 1.0000 | ORAL_TABLET | Freq: Every day | ORAL | Status: DC

## 2020-07-30 MED ORDER — DARUN-COBIC-EMTRICIT-TENOFAF 800-150-200-10 MG PO TABS
1.0000 | ORAL_TABLET | Freq: Every day | ORAL | Status: DC
  Administered 2020-07-30: 1 via ORAL
  Filled 2020-07-30 (×2): qty 1

## 2020-07-30 MED ORDER — AZITHROMYCIN 600 MG PO TABS: 1200.0000 mg | ORAL_TABLET | ORAL | 6 refills | Status: DC

## 2020-07-30 MED ORDER — DARUN-COBIC-EMTRICIT-TENOFAF 800-150-200-10 MG PO TABS: 1.0000 | ORAL_TABLET | Freq: Every day | ORAL | 0 refills | Status: DC

## 2020-07-30 MED ORDER — FLUCONAZOLE 200 MG PO TABS: 200.0000 mg | ORAL_TABLET | Freq: Every day | ORAL | 0 refills | Status: DC

## 2020-07-30 MED ORDER — SULFAMETHOXAZOLE-TRIMETHOPRIM 400-80 MG PO TABS: 1.0000 | ORAL_TABLET | Freq: Every day | ORAL | 0 refills | Status: DC

## 2020-07-30 MED FILL — SYMTUZA 800-150-200-10 MG T: 800-150-200 | 30 days supply | Qty: 30 | Fill #0

## 2020-07-30 MED FILL — SULFAMETHOXAZOLE-TMP SS TAB: 400-80 | 30 days supply | Qty: 30 | Fill #0

## 2020-07-30 MED FILL — AZITHROMYCIN 600 MG TABS: 600 | 28 days supply | Qty: 8 | Fill #0

## 2020-07-30 MED FILL — FLUCONAZOLE 200 MG TABLET: 200 | 10 days supply | Qty: 10 | Fill #0

## 2020-07-30 NOTE — Discharge Instructions (Signed)
Person Under Monitoring Name: [facility]  Location: [address] [place] [address]   Infection Prevention Recommendations for Individuals Confirmed to have, or Being Evaluated for, 2019 Novel Coronavirus (COVID-19) Infection Who Receive Care at Home  Individuals who are confirmed to have, or are being evaluated for, COVID-19 should follow the prevention steps below until a healthcare provider or local or state health department says they can return to normal activities.  Stay home except to get medical care You should restrict activities outside your home, except for getting medical care. Do not go to work, school, or public areas, and do not use public transportation or taxis.  Call ahead before visiting your doctor Before your medical appointment, call the healthcare provider and tell them that you have, or are being evaluated for, COVID-19 infection. This will help the healthcare providers office take steps to keep other people from getting infected. Ask your healthcare provider to call the local or state health department.  Monitor your symptoms Seek prompt medical attention if your illness is worsening (e.g., difficulty breathing). Before going to your medical appointment, call the healthcare provider and tell them that you have, or are being evaluated for, COVID-19 infection. Ask your healthcare provider to call the local or state health department.  Wear a facemask You should wear a facemask that covers your nose and mouth when you are in the same room with other people and when you visit a healthcare provider. People who live with or visit you should also wear a facemask while they are in the same room with you.  Separate yourself from other people in your home As much as possible, you should stay in a different room from other people in your home. Also, you should use a separate bathroom, if available.  Avoid sharing household  items You should not share dishes, drinking glasses, cups, eating utensils, towels, bedding, or other items with other people in your home. After using these items, you should wash them thoroughly with soap and water.  Cover your coughs and sneezes Cover your mouth and nose with a tissue when you cough or sneeze, or you can cough or sneeze into your sleeve. Throw used tissues in a lined trash can, and immediately wash your hands with soap and water for at least 20 seconds or use an alcohol-based hand rub.  Wash your [employer] your hands often and thoroughly with soap and water for at least 20 seconds. You can use an alcohol-based hand sanitizer if soap and water are not available and if your hands are not visibly dirty. Avoid touching your eyes, nose, and mouth with unwashed hands.   Prevention Steps for Caregivers and Household Members of Individuals Confirmed to have, or Being Evaluated for, COVID-19 Infection Being Cared for in the Home  If you live with, or provide care at home for, a person confirmed to have, or being evaluated for, COVID-19 infection please follow these guidelines to prevent infection:  Follow healthcare providers instructions Make sure that you understand and can help the patient follow any healthcare provider instructions for all care.  Provide for the patients basic needs You should help the patient with basic needs in the home and provide support for getting groceries, prescriptions, and other personal needs.  Monitor the patients symptoms If they are getting sicker, call his or her medical provider and tell them that the patient has, or is being evaluated for, COVID-19 infection. This will help the healthcare  providers office take steps to keep other people from getting infected. Ask the healthcare provider to call the local or state health department.  Limit the number of people who have contact with the patient  If possible, have only one  caregiver for the patient.  Other household members should stay in another home or place of residence. If this is not possible, they should stay  in another room, or be separated from the patient as much as possible. Use a separate bathroom, if available.  Restrict visitors who do not have an essential need to be in the home.  Keep older adults, very young children, and other sick people away from the patient Keep older adults, very young children, and those who have compromised immune systems or chronic health conditions away from the patient. This includes people with chronic heart, lung, or kidney conditions, diabetes, and cancer.  Ensure good ventilation Make sure that shared spaces in the home have good air flow, such as from an air conditioner or an opened window, weather permitting.  Wash your hands often  Wash your hands often and thoroughly with soap and water for at least 20 seconds. You can use an alcohol based hand sanitizer if soap and water are not available and if your hands are not visibly dirty.  Avoid touching your eyes, nose, and mouth with unwashed hands.  Use disposable paper towels to dry your hands. If not available, use dedicated cloth towels and replace them when they become wet.  Wear a facemask and gloves  Wear a disposable facemask at all times in the room and gloves when you touch or have contact with the patients blood, body fluids, and/or secretions or excretions, such as sweat, saliva, sputum, nasal mucus, vomit, urine, or feces.  Ensure the mask fits over your nose and mouth tightly, and do not touch it during use.  Throw out disposable facemasks and gloves after using them. Do not reuse.  Wash your hands immediately after removing your facemask and gloves.  If your personal clothing becomes contaminated, carefully remove clothing and launder. Wash your hands after handling contaminated clothing.  Place all used disposable facemasks, gloves, and  other waste in a lined container before disposing them with other household waste.  Remove gloves and wash your hands immediately after handling these items.  Do not share dishes, glasses, or other household items with the patient  Avoid sharing household items. You should not share dishes, drinking glasses, cups, eating utensils, towels, bedding, or other items with a patient who is confirmed to have, or being evaluated for, COVID-19 infection.  After the person uses these items, you should wash them thoroughly with soap and water.  Wash laundry thoroughly  Immediately remove and wash clothes or bedding that have blood, body fluids, and/or secretions or excretions, such as sweat, saliva, sputum, nasal mucus, vomit, urine, or feces, on them.  Wear gloves when handling laundry from the patient.  Read and follow directions on labels of laundry or clothing items and detergent. In general, wash and dry with the warmest temperatures recommended on the label.  Clean all areas the individual has used often  Clean all touchable surfaces, such as counters, tabletops, doorknobs, bathroom fixtures, toilets, phones, keyboards, tablets, and bedside tables, every day. Also, clean any surfaces that may have blood, body fluids, and/or secretions or excretions on them.  Wear gloves when cleaning surfaces the patient has come in contact with.  Use a diluted bleach solution (e.g., dilute bleach with  1 part bleach and 10 parts water) or a household disinfectant with a label that says EPA-registered for coronaviruses. To make a bleach solution at home, add 1 tablespoon of bleach to 1 quart (4 cups) of water. For a larger supply, add  cup of bleach to 1 gallon (16 cups) of water.  Read labels of cleaning products and follow recommendations provided on product labels. Labels contain instructions for safe and effective use of the cleaning product including precautions you should take when applying the product,  such as wearing gloves or eye protection and making sure you have good ventilation during use of the product.  Remove gloves and wash hands immediately after cleaning.  Monitor yourself for signs and symptoms of illness Caregivers and household members are considered close contacts, should monitor their health, and will be asked to limit movement outside of the home to the extent possible. Follow the monitoring steps for close contacts listed on the symptom monitoring form.   ? If you have additional questions, contact your local health department or call the epidemiologist on call at [phone] (available 24/7). ? This guidance is subject to change. For the most up-to-date guidance from [facility], please refer to their website: [email or web address]

## 2020-07-30 NOTE — Care Management (Addendum)
Case Manager contacted [employer] and arranged for patient to be picked up at the [employer], [place] station at [employer] Retail banker notified. Vance Peper, RN BSN Case Manager 2:44p unable to speak directly to [facility] calls. Nurse answering the phone stated they would work on getting patient downstairs.  3:00pm CM was notified by [facility] that patient's transport had left, they were 5 mins.  late getting him downstairs. CM is trying to contact [facility] transport to have patient picked up.   3:22pm. CM waiting for return call from [facility], they are locating a driver for COVID patient. 4:32pm CM confirmed with [facility]- Romeo Apple- transportation is scheduled for 5pm pickup by Driver Mr. Bishop Dublin. Case Manager gave them Melissa RN's number [phone] and called her with scheduled pickup time. CM asked that she have the patient downstairs by 4:45pm.   Vance Peper, RN BSN Case Manager

## 2020-07-30 NOTE — Progress Notes (Signed)
[facility]    Date of Admission:  07/24/2020   Total days of antibiotics 7                   ID: [facility] Jon Love is a 45 y.o. male with  Principal Problem:   Sepsis due to COVID-19 [facility]) Active Problems:   Protein-calorie malnutrition, severe (HCC)   Gastroesophageal reflux disease without esophagitis   AIDS (acquired immune deficiency syndrome) (HCC)   Alcohol use   Multifocal pneumonia    Subjective: Mild cough and mild headache but overall improved  ROS: 12 point ros is otherwise negative  Used spanish interpreter line. Medications:  . vitamin C  500 mg Oral Daily  . [START ON 08/04/2020] azithromycin  1,200 mg Oral Weekly  . Darunavir-Cobicisctat-Emtricitabine-Tenofovir Alafenamide  1 tablet Oral Q breakfast  . enoxaparin (LOVENOX) injection  40 mg Subcutaneous Q24H  . fluconazole  200 mg Oral Daily  . pantoprazole  40 mg Oral Q1200  . sulfamethoxazole-trimethoprim  1 tablet Oral Daily  . zinc sulfate  220 mg Oral Daily    Objective: Vital signs in last 24 hours: Temp:  [97.5 F (36.4 C)-97.9 F (36.6 C)] 97.9 F (36.6 C) (02/21 0757) Pulse Rate:  [71-97] 75 (02/21 0757) Resp:  [14-20] 14 (02/21 0757) BP: (88-102)/(62-74) 88/69 (02/21 0757) SpO2:  [93 %-99 %] 96 % (02/21 0757) Physical Exam  Constitutional: He is oriented to person, place, and time. He appears well-developed and well-nourished. No distress.  HENT: +thrush Mouth/Throat: Oropharynx is clear and moist. No oropharyngeal exudate.  Cardiovascular: Normal rate, regular rhythm and normal heart sounds. Exam reveals no gallop and no friction rub.  No murmur heard.  Pulmonary/Chest: Effort normal and breath sounds normal. No respiratory distress. He has no wheezes.  Abdominal: Soft. Bowel sounds are normal. He exhibits no distension. There is no tenderness.  Lymphadenopathy:  He has no cervical adenopathy.  Neurological: He is alert and oriented to person, place,  and time.  Skin: Skin is warm and dry. No rash noted. No erythema.  Psychiatric: He has a normal mood and affect. His behavior is normal.     Lab Results Recent Labs    07/28/20 0331 07/29/20 0134  WBC 8.2 8.2  HGB 13.1 13.3  HCT 39.1 40.7  NA 131* 131*  K 3.7 3.7  CL 98 99  CO2 23 22  BUN 12 10  CREATININE 0.64 0.65   Liver Panel Recent Labs    07/28/20 0331 07/29/20 0134  PROT 5.9* 6.1*  ALBUMIN 2.9* 2.9*  AST 56* 30  ALT 48* 56*  ALKPHOS 82 83  BILITOT 0.8 1.0   Sedimentation Rate No results for input(s): ESRSEDRATE in the last 72 hours. C-Reactive Protein Recent Labs    07/28/20 0331 07/29/20 0134  CRP 2.4* 1.9*    Microbiology: reviewed Studies/Results: DG Eye Foreign Body  Result Date: 07/29/2020 CLINICAL DATA:  45 year old male pending MRI.  History of metal work EXAM: ORBITS FOR FOREIGN BODY - 2 VIEW COMPARISON:  None. FINDINGS: There is no evidence of metallic foreign body within the orbits. No significant bone abnormality identified. IMPRESSION: No evidence of metallic foreign body within the orbits. Electronically Signed   By: Gilmer Mor D.O.   On: 07/29/2020 10:56   MR BRAIN W WO CONTRAST  Result Date: 07/29/2020 CLINICAL DATA:  Headache. Nausea and vomiting. Fever. COVID-19 pneumonia. History of HIV. EXAM: MRI HEAD WITHOUT AND WITH CONTRAST TECHNIQUE: Multiplanar, multiecho  pulse sequences of the brain and surrounding structures were obtained without and with intravenous contrast. CONTRAST:  4mL GADAVIST GADOBUTROL 1 MMOL/ML IV SOLN COMPARISON:  None. FINDINGS: Brain: There is no evidence of an acute infarct, intracranial hemorrhage, mass, midline shift, or extra-axial fluid collection. The ventricles and sulci are borderline prominent for age. The brain is normal in signal. No abnormal enhancement is identified. Vascular: Major intracranial vascular flow voids are preserved. Skull and upper cervical spine: Unremarkable bone marrow signal.  Sinuses/Orbits: Unremarkable orbits. Fluid in the right maxillary sinus. Clear mastoid air cells. Other: None. IMPRESSION: 1. Unremarkable appearance of the brain aside from borderline age advanced cerebral atrophy. 2. Right maxillary sinus fluid, correlate for acute sinusitis. Electronically Signed   By: Sebastian Ache M.D.   On: 07/29/2020 12:52   MR LUMBAR SPINE WO CONTRAST  Result Date: 07/29/2020 CLINICAL DATA:  Low back pain with suspicion of spinal infection. HIV disease. EXAM: MRI LUMBAR SPINE WITHOUT CONTRAST TECHNIQUE: Multiplanar, multisequence MR imaging of the lumbar spine was performed. No intravenous contrast was administered. COMPARISON:  None. FINDINGS: Segmentation:  5 lumbar type vertebral bodies. Alignment:  Normal Vertebrae: Slightly hypercellular marrow pattern but without evidence of focal or active marrow space disease. Conus medullaris and cauda equina: Conus extends to the L1-2 level. Conus and cauda equina appear normal. Paraspinal and other soft tissues: Negative Disc levels: No abnormality at L2-3 or above. L3-4: Desiccation and mild bulging of the disc. Mild facet degeneration with facet and ligamentous hypertrophy. 2 mm synovial cyst on the left. Mild stenosis of the lateral recesses but without definite neural compression. Findings at this level could relate to back pain. L4-5: Desiccation and bulging of the disc. Facet and ligamentous hypertrophy. Mild stenosis of the canal and both lateral recesses. Some potential for neural compression in the lateral recesses. Findings at this level could certainly relate to low back pain. L5-S1: Desiccation and bulging of the disc. Mild facet and ligamentous hypertrophy. No compressive stenosis of the canal or foramina. Findings at this level could relate to low back pain. There is no finding on this exam that would specifically suggest spinal infection IMPRESSION: 1. No finding on this exam that would specifically suggest spinal infection. 2.  Fairly ordinary mild degenerative changes in the lower lumbar spine which could contribute to back pain. At L3-4, L4-5 and L5-S1, there is disc degeneration with mild bulging of the discs and facet hypertrophy. There is a 2 mm synovial cyst on the left at L3-4. At L3-4 and L4-5, there is mild stenosis of the lateral recesses and foramina. At L4-5, there is some potential for neural compression in the lateral recesses. Electronically Signed   By: Paulina Fusi M.D.   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 HIV genotype is pending. He did not have significant mutations on last genotype in 2019. He was previously on biktarvy. We will give him 30 day supply in hand and follow up to clinic in 7-10 days  oi proph = take bactrim ds daily and azithromycin 1200mg  weekly, every Sunday  Thrush = will do fluconazole 200mg  po daily x 10 day  covid pneumonia +/-bacterial pneumonia = completed 5 d of azithro plus ceftriaxone  Acute sinusitis = likely from covid, plus has had abtx  headache = associated with covid illness since cryptococcal meningitis is ruled out   Please let  patient know he has follow up appt with dr Zenaida Niece dam on 2/28 with 10am with Dr Earlene Plater.  [facility] Cell: [phone] Pager: [phone]  07/30/2020, 11:50 AM

## 2020-07-30 NOTE — Discharge Summary (Signed)
PATIENT DETAILS Name: Jon Love Age: 45 y.o. Sex: male Date of Birth: [birth year] MRN: [id number]. Admitting Physician: Rometta Emery, MD JXB:[id number], No Pcp Per  Admit Date: 07/24/2020 Discharge date: 07/30/2020  Recommendations for Outpatient Follow-up:  1. Follow up with PCP in 1-2 weeks 2. Please obtain CMP/CBC in one week 3. CSF VDRL pending-follow 4. Follow final CSF fungal cultures 5. HIV genotype pending-please follow   Admitted From:  Home   Disposition: Home    Home Health: No  Equipment/Devices: None  Discharge Condition: Stable  CODE STATUS: FULL CODE  Diet recommendation:  Diet Order            Diet general           Diet regular Room service appropriate? Yes; Fluid consistency: Thin  Diet effective now                  Brief Narrative: Patient is a 45 y.o. male with PMHx of HIV-noncompliant with ART-presented to the hospital with 1 week history of cough/fevers and myalgias.  Found to have COVID-19 infection and subsequently admitted to the hospitalist service.  See below for further details  COVID-19 vaccinated status: Unvaccinated  Significant Events: 2/15>> Admit to [facility] for possible COVID-19 pneumonia  Significant studies: 2/15>>Chest x-ray: Chronic bronchiectasis/nodularity in the right LLL 2/17>>CD4 <35 2/20>> MRI brain: No acute abnormalities 2/20>> MRI LS spine: Degenerative changes-no acute abnormalities  COVID-19 medications: Steroids: 2/15>> 2/20 Remdesivir: 2/15>>2/19  Antibiotics: Rocephin: 2/15>>2/19 Zithromax: 2/15>>2/19  Microbiology data: 2/15 >>blood culture: No growth 2/19>> CSF culture: No growth 2/19>> cryptococcal antigen CSF: Negative 2/19>> VDRL CSF: Pending  Procedures: 2/19>> LP by IR  Consults: Neurology, ID  Brief Hospital Course: COVID-19 infection-possible RLL bacterial pneumonia: Improved-on room air-has completed a course of antimicrobial therapy with  Remdesivir/Zithromax/Rocephin.  No longer on steroids.  COVID-19 Labs:  Recent Labs    07/28/20 0331 07/29/20 0134  DDIMER <0.27 <0.27  CRP 2.4* 1.9*    Lab Results  Component Value Date   SARSCOV2NAA POSITIVE (A) 07/24/2020   SARSCOV2NAA NEGATIVE 03/13/2019   SARSCOV2NAA NEGATIVE 12/05/2018    Headache: Initially attributed to acute illness/fever/Covid infection-however apparently going on for approximately a month per patient-CSF studies unremarkable so far-negative for cryptococcus infection.  MRI brain without any acute abnormalities.  Continue supportive cares headache-is usually resolved with Tylenol per patient.  Defer further to PCP.  HIV/AIDS: Noncompliant with antiretrovirals (claims he had dizziness)-on Bactrim/Zithromax for prophylaxis.  ID followed closely-has been restarted on ART on discharge.  Follow-up appointment made at the ID clinic.  Oral thrush: On fluconazole  History of alcohol: No withdrawal symptoms  Discharge Diagnoses:  Principal Problem:   Sepsis due to COVID-19 [name]) Active Problems:   Protein-calorie malnutrition, severe (HCC)   Gastroesophageal reflux disease without esophagitis   AIDS (acquired immune deficiency syndrome) (HCC)   Alcohol use   Multifocal pneumonia   Discharge Instructions:    Person Under Monitoring Name: [name]  Location: [address] [place] [address]   Infection Prevention Recommendations for Individuals Confirmed to have, or Being Evaluated for, 2019 Novel Coronavirus (COVID-19) Infection Who Receive Care at Home  Individuals who are confirmed to have, or are being evaluated for, COVID-19 should follow the prevention steps below until a healthcare provider or local or state health department says they can return to normal activities.  Stay home except to get medical care You should restrict activities outside your home, except for getting  medical care. Do not go to work,  school, or public areas, and do not use public transportation or taxis.  Call ahead before visiting your doctor Before your medical appointment, call the healthcare provider and tell them that you have, or are being evaluated for, COVID-19 infection. This will help the healthcare provider's office take steps to keep other people from getting infected. Ask your healthcare provider to call the local or state health department.  Monitor your symptoms Seek prompt medical attention if your illness is worsening (e.g., difficulty breathing). Before going to your medical appointment, call the healthcare provider and tell them that you have, or are being evaluated for, COVID-19 infection. Ask your healthcare provider to call the local or state health department.  Wear a facemask You should wear a facemask that covers your nose and mouth when you are in the same room with other people and when you visit a healthcare provider. People who live with or visit you should also wear a facemask while they are in the same room with you.  Separate yourself from other people in your home As much as possible, you should stay in a different room from other people in your home. Also, you should use a separate bathroom, if available.  Avoid sharing household items You should not share dishes, drinking glasses, cups, eating utensils, towels, bedding, or other items with other people in your home. After using these items, you should wash them thoroughly with soap and water.  Cover your coughs and sneezes Cover your mouth and nose with a tissue when you cough or sneeze, or you can cough or sneeze into your sleeve. Throw used tissues in a lined trash can, and immediately wash your hands with soap and water for at least 20 seconds or use an alcohol-based hand rub.  Wash your [employer] your hands often and thoroughly with soap and water for at least 20 seconds. You can use an alcohol-based hand sanitizer if soap  and water are not available and if your hands are not visibly dirty. Avoid touching your eyes, nose, and mouth with unwashed hands.   Prevention Steps for Caregivers and Household Members of Individuals Confirmed to have, or Being Evaluated for, COVID-19 Infection Being Cared for in the Home  If you live with, or provide care at home for, a person confirmed to have, or being evaluated for, COVID-19 infection please follow these guidelines to prevent infection:  Follow healthcare provider's instructions Make sure that you understand and can help the patient follow any healthcare provider instructions for all care.  Provide for the patient's basic needs You should help the patient with basic needs in the home and provide support for getting groceries, prescriptions, and other personal needs.  Monitor the patient's symptoms If they are getting sicker, call his or her medical provider and tell them that the patient has, or is being evaluated for, COVID-19 infection. This will help the healthcare provider's office take steps to keep other people from getting infected. Ask the healthcare provider to call the local or state health department.  Limit the number of people who have contact with the patient  If possible, have only one caregiver for the patient.  Other household members should stay in another home or place of residence. If this is not possible, they should stay  in another room, or be separated from the patient as much as possible. Use a separate bathroom, if available.  Restrict visitors who do not have an essential need  to be in the home.  Keep older adults, very young children, and other sick people away from the patient Keep older adults, very young children, and those who have compromised immune systems or chronic health conditions away from the patient. This includes people with chronic heart, lung, or kidney conditions, diabetes, and cancer.  Ensure good  ventilation Make sure that shared spaces in the home have good air flow, such as from an air conditioner or an opened window, weather permitting.  Wash your hands often  Wash your hands often and thoroughly with soap and water for at least 20 seconds. You can use an alcohol based hand sanitizer if soap and water are not available and if your hands are not visibly dirty.  Avoid touching your eyes, nose, and mouth with unwashed hands.  Use disposable paper towels to dry your hands. If not available, use dedicated cloth towels and replace them when they become wet.  Wear a facemask and gloves  Wear a disposable facemask at all times in the room and gloves when you touch or have contact with the patient's blood, body fluids, and/or secretions or excretions, such as sweat, saliva, sputum, nasal mucus, vomit, urine, or feces.  Ensure the mask fits over your nose and mouth tightly, and do not touch it during use.  Throw out disposable facemasks and gloves after using them. Do not reuse.  Wash your hands immediately after removing your facemask and gloves.  If your personal clothing becomes contaminated, carefully remove clothing and launder. Wash your hands after handling contaminated clothing.  Place all used disposable facemasks, gloves, and other waste in a lined container before disposing them with other household waste.  Remove gloves and wash your hands immediately after handling these items.  Do not share dishes, glasses, or other household items with the patient  Avoid sharing household items. You should not share dishes, drinking glasses, cups, eating utensils, towels, bedding, or other items with a patient who is confirmed to have, or being evaluated for, COVID-19 infection.  After the person uses these items, you should wash them thoroughly with soap and water.  Wash laundry thoroughly  Immediately remove and wash clothes or bedding that have blood, body fluids, and/or  secretions or excretions, such as sweat, saliva, sputum, nasal mucus, vomit, urine, or feces, on them.  Wear gloves when handling laundry from the patient.  Read and follow directions on labels of laundry or clothing items and detergent. In general, wash and dry with the warmest temperatures recommended on the label.  Clean all areas the individual has used often  Clean all touchable surfaces, such as counters, tabletops, doorknobs, bathroom fixtures, toilets, phones, keyboards, tablets, and bedside tables, every day. Also, clean any surfaces that may have blood, body fluids, and/or secretions or excretions on them.  Wear gloves when cleaning surfaces the patient has come in contact with.  Use a diluted bleach solution (e.g., dilute bleach with 1 part bleach and 10 parts water) or a household disinfectant with a label that says EPA-registered for coronaviruses. To make a bleach solution at home, add 1 tablespoon of bleach to 1 quart (4 cups) of water. For a larger supply, add  cup of bleach to 1 gallon (16 cups) of water.  Read labels of cleaning products and follow recommendations provided on product labels. Labels contain instructions for safe and effective use of the cleaning product including precautions you should take when applying the product, such as wearing gloves or eye protection and  making sure you have good ventilation during use of the product.  Remove gloves and wash hands immediately after cleaning.  Monitor yourself for signs and symptoms of illness Caregivers and household members are considered close contacts, should monitor their health, and will be asked to limit movement outside of the home to the extent possible. Follow the monitoring steps for close contacts listed on the symptom monitoring form.   ? If you have additional questions, contact your local health department or call the epidemiologist on call at [phone] (available 24/7). ? This guidance is subject  to change. For the most up-to-date guidance from [facility], please refer to their website: [employer]Met[employer].hu    Activity:  As tolerated  Discharge Instructions    Call MD for:  difficulty breathing, headache or visual disturbances   Complete by: As directed    Call MD for:  extreme fatigue   Complete by: As directed    Call MD for:  persistant dizziness or light-headedness   Complete by: As directed    Diet general   Complete by: As directed    Discharge instructions   Complete by: As directed    Follow with the infectious disease clinic as instructed  Please get a complete blood count and chemistry panel checked by your Primary MD at your next visit, and again as instructed by your Primary MD.  Get Medicines reviewed and adjusted: Please take all your medications with you for your next visit with your Primary MD  Laboratory/radiological data: Please request your Primary MD to go over all hospital tests and procedure/radiological results at the follow up, please ask your Primary MD to get all Hospital records sent to his/her office.  In some cases, they will be blood work, cultures and biopsy results pending at the time of your discharge. Please request that your primary care M.D. follows up on these results.  Also Note the following: If you experience worsening of your admission symptoms, develop shortness of breath, life threatening emergency, suicidal or homicidal thoughts you must seek medical attention immediately by calling 911 or calling your MD immediately  if symptoms less severe.  You must read complete instructions/literature along with all the possible adverse reactions/side effects for all the Medicines you take and that have been prescribed to you. Take any new Medicines after you have completely understood and accpet all the possible adverse reactions/side effects.   Do not drive when taking Pain medications or  sleeping medications (Benzodaizepines)  Do not take more than prescribed Pain, Sleep and Anxiety Medications. It is not advisable to combine anxiety,sleep and pain medications without talking with your primary care practitioner  Special Instructions: If you have smoked or chewed Tobacco  in the last 2 yrs please stop smoking, stop any regular Alcohol  and or any Recreational drug use.  Wear Seat belts while driving.  Please note: You were cared for by a hospitalist during your hospital stay. Once you are discharged, your primary care physician will handle any further medical issues. Please note that NO REFILLS for any discharge medications will be authorized once you are discharged, as it is imperative that you return to your primary care physician (or establish a relationship with a primary care physician if you do not have one) for your post hospital discharge needs so that they can reassess your need for medications and monitor your lab values.   1.)  21 days of isolation from 2/15.  2.)  If you develop worsening shortness of breath-please seek  immediate medical attention  3.)  Please ask your primary care MD to repeat two-view chest x-ray in 4 to 6 weeks.   Increase activity slowly   Complete by: As directed      Allergies as of 07/30/2020      Reactions   Penicillins Hives, Itching   Has patient had a PCN reaction causing immediate rash, facial/tongue/throat swelling, SOB or lightheadedness with hypotension: Yes Has patient had a PCN reaction causing severe rash involving mucus membranes or skin necrosis: No Has patient had a PCN reaction that required hospitalization No Has patient had a PCN reaction occurring within the last 10 years: No If all of the above answers are "NO", then may proceed with Cephalosporin use.      Medication List    STOP taking these medications   benzonatate 100 MG capsule Commonly known as: TESSALON   Biktarvy 50-200-25 MG Tabs tablet Generic drug:  bictegravir-emtricitabine-tenofovir AF     TAKE these medications   azithromycin 600 MG tablet Commonly known as: ZITHROMAX Take 2 tablets (1,200 mg total) by mouth once a week. Start taking on: August 04, 2020   Darunavir-Cobicisctat-Emtricitabine-Tenofovir Alafenamide 800-150-200-10 MG Tabs Commonly known as: SYMTUZA Take 1 tablet by mouth daily with breakfast.   fluconazole 200 MG tablet Commonly known as: DIFLUCAN Take 1 tablet (200 mg total) by mouth daily.   sulfamethoxazole-trimethoprim 400-80 MG tablet Commonly known as: BACTRIM Take 1 tablet by mouth daily.       Follow-up Information    Kathlynn Grate, DO Follow up on 08/06/2020.   Specialties: Internal Medicine, Infectious Diseases Why: appointment at 10 am Contact information: [address] [place] [address] [phone]              Allergies  Allergen Reactions  . Penicillins Hives and Itching    Has patient had a PCN reaction causing immediate rash, facial/tongue/throat swelling, SOB or lightheadedness with hypotension: Yes Has patient had a PCN reaction causing severe rash involving mucus membranes or skin necrosis: No Has patient had a PCN reaction that required hospitalization No Has patient had a PCN reaction occurring within the last 10 years: No If all of the above answers are "NO", then may proceed with Cephalosporin use.     Other Procedures/Studies: DG Eye Foreign Body  Result Date: 07/29/2020 CLINICAL DATA:  45 year old male pending MRI.  History of metal work EXAM: ORBITS FOR FOREIGN BODY - 2 VIEW COMPARISON:  None. FINDINGS: There is no evidence of metallic foreign body within the orbits. No significant bone abnormality identified. IMPRESSION: No evidence of metallic foreign body within the orbits. Electronically Signed   By: Gilmer Mor D.O.   On: 07/29/2020 10:56   MR BRAIN W WO CONTRAST  Result Date: 07/29/2020 CLINICAL DATA:  Headache. Nausea and vomiting.  Fever. COVID-19 pneumonia. History of HIV. EXAM: MRI HEAD WITHOUT AND WITH CONTRAST TECHNIQUE: Multiplanar, multiecho pulse sequences of the brain and surrounding structures were obtained without and with intravenous contrast. CONTRAST:  6mL GADAVIST GADOBUTROL 1 MMOL/ML IV SOLN COMPARISON:  None. FINDINGS: Brain: There is no evidence of an acute infarct, intracranial hemorrhage, mass, midline shift, or extra-axial fluid collection. The ventricles and sulci are borderline prominent for age. The brain is normal in signal. No abnormal enhancement is identified. Vascular: Major intracranial vascular flow voids are preserved. Skull and upper cervical spine: Unremarkable bone marrow signal. Sinuses/Orbits: Unremarkable orbits. Fluid in the right maxillary sinus. Clear mastoid air cells. Other: None. IMPRESSION:  1. Unremarkable appearance of the brain aside from borderline age advanced cerebral atrophy. 2. Right maxillary sinus fluid, correlate for acute sinusitis. Electronically Signed   By: Sebastian Ache M.D.   On: 07/29/2020 12:52   MR LUMBAR SPINE WO CONTRAST  Result Date: 07/29/2020 CLINICAL DATA:  Low back pain with suspicion of spinal infection. HIV disease. EXAM: MRI LUMBAR SPINE WITHOUT CONTRAST TECHNIQUE: Multiplanar, multisequence MR imaging of the lumbar spine was performed. No intravenous contrast was administered. COMPARISON:  None. FINDINGS: Segmentation:  5 lumbar type vertebral bodies. Alignment:  Normal Vertebrae: Slightly hypercellular marrow pattern but without evidence of focal or active marrow space disease. Conus medullaris and cauda equina: Conus extends to the L1-2 level. Conus and cauda equina appear normal. Paraspinal and other soft tissues: Negative Disc levels: No abnormality at L2-3 or above. L3-4: Desiccation and mild bulging of the disc. Mild facet degeneration with facet and ligamentous hypertrophy. 2 mm synovial cyst on the left. Mild stenosis of the lateral recesses but without  definite neural compression. Findings at this level could relate to back pain. L4-5: Desiccation and bulging of the disc. Facet and ligamentous hypertrophy. Mild stenosis of the canal and both lateral recesses. Some potential for neural compression in the lateral recesses. Findings at this level could certainly relate to low back pain. L5-S1: Desiccation and bulging of the disc. Mild facet and ligamentous hypertrophy. No compressive stenosis of the canal or foramina. Findings at this level could relate to low back pain. There is no finding on this exam that would specifically suggest spinal infection IMPRESSION: 1. No finding on this exam that would specifically suggest spinal infection. 2. Fairly ordinary mild degenerative changes in the lower lumbar spine which could contribute to back pain. At L3-4, L4-5 and L5-S1, there is disc degeneration with mild bulging of the discs and facet hypertrophy. There is a 2 mm synovial cyst on the left at L3-4. At L3-4 and L4-5, there is mild stenosis of the lateral recesses and foramina. At L4-5, there is some potential for neural compression in the lateral recesses. Electronically Signed   By: Paulina Fusi M.D.   On: 07/29/2020 23:15   DG Chest Port 1 View  Result Date: 07/24/2020 CLINICAL DATA:  COVID positive.  Body aches and fever.  Febrile EXAM: PORTABLE CHEST 1 VIEW COMPARISON:  03/13/2019 radiograph, CT 03/15/2019 FINDINGS: Normal cardiac silhouette. There is chronic nodular opacity in the RIGHT lower lobe similar to comparison CT and radiograph. LEFT lung is clear. No pneumothorax. IMPRESSION: Chronic bronchiectasis and nodularity in the RIGHT lower lobe consistent with chronic or recurrent pulmonary infection. Consider aspiration and fungal infections. Electronically Signed   By: Genevive Bi M.D.   On: 07/24/2020 17:01   DG FLUORO GUIDE LUMBAR PUNCTURE  Result Date: 07/28/2020 CLINICAL DATA:  Fluoroscopic leak guided LP EXAM: DIAGNOSTIC LUMBAR PUNCTURE  UNDER FLUOROSCOPIC GUIDANCE FLUOROSCOPY TIME:  Fluoroscopy Time:  2.9 minutes Number of Acquired Spot Images: 0 PROCEDURE: Informed consent was obtained from the patient prior to the procedure, including potential complications of headache, allergy, and pain. With the patient prone, the lower back was prepped with Betadine. 1% Lidocaine was used for local anesthesia. Lumbar puncture was performed at the L4-5 level using a 20 gauge needle with return of slightly pink tinged clear CSF. Sixteen ml of CSF were obtained for laboratory studies. The CSF was completely clear by the end of collection. The patient tolerated the procedure well and there were no apparent complications. Of note, this was  a difficult lumbar puncture. CSF was finally obtained at the third attempted level. The difficulty was likely due to low CSF pressure. Significant tilting of the table and rolling the patient into a left lateral decubitus position was necessary to obtain the fluid. The patient did have radiculopathy intermittently throughout the study. The radiculopathy dissipated shortly after completion of the study. IMPRESSION: Difficult but successful lumbar puncture. See above for details. 16 cc of fluid was sent to the lab. The first 2 vials of fluid demonstrated slightly pink tinged clear color. The last 2 vials were clear to visualization. Electronically Signed   By: Gerome Sam III M.D   On: 07/28/2020 12:59     TODAY-DAY OF DISCHARGE:  Subjective:   [facility] today has no headache,no chest abdominal pain,no new weakness tingling or numbness, feels much better wants to go home today.   Objective:   Blood pressure (!) 88/69, pulse 75, temperature 97.9 F (36.6 C), temperature source Oral, resp. rate 14, height 5\' 10"  (1.778 m), weight 64.3 kg, SpO2 96 %.  Intake/Output Summary (Last 24 hours) at 07/30/2020 1222 Last data filed at 07/30/2020 1003 Gross per 24 hour  Intake 50 ml  Output 1600 ml  Net  -1550 ml   Filed Weights   07/24/20 2155  Weight: 64.3 kg    Exam: Awake Alert, Oriented *3, No new F.N deficits, Normal affect [place].AT,PERRAL Supple Neck,No JVD, No cervical lymphadenopathy appriciated.  Symmetrical Chest wall movement, Good air movement bilaterally, CTAB RRR,No Gallops,Rubs or new Murmurs, No Parasternal Heave +ve B.Sounds, Abd Soft, Non tender, No organomegaly appriciated, No rebound -guarding or rigidity. No Cyanosis, Clubbing or edema, No new Rash or bruise   PERTINENT RADIOLOGIC STUDIES: DG Eye Foreign Body  Result Date: 07/29/2020 CLINICAL DATA:  45 year old male pending MRI.  History of metal work EXAM: ORBITS FOR FOREIGN BODY - 2 VIEW COMPARISON:  None. FINDINGS: There is no evidence of metallic foreign body within the orbits. No significant bone abnormality identified. IMPRESSION: No evidence of metallic foreign body within the orbits. Electronically Signed   By: 59 D.O.   On: 07/29/2020 10:56   MR BRAIN W WO CONTRAST  Result Date: 07/29/2020 CLINICAL DATA:  Headache. Nausea and vomiting. Fever. COVID-19 pneumonia. History of HIV. EXAM: MRI HEAD WITHOUT AND WITH CONTRAST TECHNIQUE: Multiplanar, multiecho pulse sequences of the brain and surrounding structures were obtained without and with intravenous contrast. CONTRAST:  67mL GADAVIST GADOBUTROL 1 MMOL/ML IV SOLN COMPARISON:  None. FINDINGS: Brain: There is no evidence of an acute infarct, intracranial hemorrhage, mass, midline shift, or extra-axial fluid collection. The ventricles and sulci are borderline prominent for age. The brain is normal in signal. No abnormal enhancement is identified. Vascular: Major intracranial vascular flow voids are preserved. Skull and upper cervical spine: Unremarkable bone marrow signal. Sinuses/Orbits: Unremarkable orbits. Fluid in the right maxillary sinus. Clear mastoid air cells. Other: None. IMPRESSION: 1. Unremarkable appearance of the brain aside from borderline age  advanced cerebral atrophy. 2. Right maxillary sinus fluid, correlate for acute sinusitis. Electronically Signed   By: 11m M.D.   On: 07/29/2020 12:52   MR LUMBAR SPINE WO CONTRAST  Result Date: 07/29/2020 CLINICAL DATA:  Low back pain with suspicion of spinal infection. HIV disease. EXAM: MRI LUMBAR SPINE WITHOUT CONTRAST TECHNIQUE: Multiplanar, multisequence MR imaging of the lumbar spine was performed. No intravenous contrast was administered. COMPARISON:  None. FINDINGS: Segmentation:  5 lumbar type vertebral bodies. Alignment:  Normal Vertebrae: Slightly hypercellular  marrow pattern but without evidence of focal or active marrow space disease. Conus medullaris and cauda equina: Conus extends to the L1-2 level. Conus and cauda equina appear normal. Paraspinal and other soft tissues: Negative Disc levels: No abnormality at L2-3 or above. L3-4: Desiccation and mild bulging of the disc. Mild facet degeneration with facet and ligamentous hypertrophy. 2 mm synovial cyst on the left. Mild stenosis of the lateral recesses but without definite neural compression. Findings at this level could relate to back pain. L4-5: Desiccation and bulging of the disc. Facet and ligamentous hypertrophy. Mild stenosis of the canal and both lateral recesses. Some potential for neural compression in the lateral recesses. Findings at this level could certainly relate to low back pain. L5-S1: Desiccation and bulging of the disc. Mild facet and ligamentous hypertrophy. No compressive stenosis of the canal or foramina. Findings at this level could relate to low back pain. There is no finding on this exam that would specifically suggest spinal infection IMPRESSION: 1. No finding on this exam that would specifically suggest spinal infection. 2. Fairly ordinary mild degenerative changes in the lower lumbar spine which could contribute to back pain. At L3-4, L4-5 and L5-S1, there is disc degeneration with mild bulging of the discs  and facet hypertrophy. There is a 2 mm synovial cyst on the left at L3-4. At L3-4 and L4-5, there is mild stenosis of the lateral recesses and foramina. At L4-5, there is some potential for neural compression in the lateral recesses. Electronically Signed   By: Paulina Fusi M.D.   On: 07/29/2020 23:15   DG Chest Port 1 View  Result Date: 07/24/2020 CLINICAL DATA:  COVID positive.  Body aches and fever.  Febrile EXAM: PORTABLE CHEST 1 VIEW COMPARISON:  03/13/2019 radiograph, CT 03/15/2019 FINDINGS: Normal cardiac silhouette. There is chronic nodular opacity in the RIGHT lower lobe similar to comparison CT and radiograph. LEFT lung is clear. No pneumothorax. IMPRESSION: Chronic bronchiectasis and nodularity in the RIGHT lower lobe consistent with chronic or recurrent pulmonary infection. Consider aspiration and fungal infections. Electronically Signed   By: Genevive Bi M.D.   On: 07/24/2020 17:01   DG FLUORO GUIDE LUMBAR PUNCTURE  Result Date: 07/28/2020 CLINICAL DATA:  Fluoroscopic leak guided LP EXAM: DIAGNOSTIC LUMBAR PUNCTURE UNDER FLUOROSCOPIC GUIDANCE FLUOROSCOPY TIME:  Fluoroscopy Time:  2.9 minutes Number of Acquired Spot Images: 0 PROCEDURE: Informed consent was obtained from the patient prior to the procedure, including potential complications of headache, allergy, and pain. With the patient prone, the lower back was prepped with Betadine. 1% Lidocaine was used for local anesthesia. Lumbar puncture was performed at the L4-5 level using a 20 gauge needle with return of slightly pink tinged clear CSF. Sixteen ml of CSF were obtained for laboratory studies. The CSF was completely clear by the end of collection. The patient tolerated the procedure well and there were no apparent complications. Of note, this was a difficult lumbar puncture. CSF was finally obtained at the third attempted level. The difficulty was likely due to low CSF pressure. Significant tilting of the table and rolling the patient  into a left lateral decubitus position was necessary to obtain the fluid. The patient did have radiculopathy intermittently throughout the study. The radiculopathy dissipated shortly after completion of the study. IMPRESSION: Difficult but successful lumbar puncture. See above for details. 16 cc of fluid was sent to the lab. The first 2 vials of fluid demonstrated slightly pink tinged clear color. The last 2 vials were clear to  visualization. Electronically Signed   By: Gerome Sam III M.D   On: 07/28/2020 12:59     PERTINENT LAB RESULTS: CBC: Recent Labs    07/28/20 0331 07/29/20 0134  WBC 8.2 8.2  HGB 13.1 13.3  HCT 39.1 40.7  PLT 308 298   CMET CMP     Component Value Date/Time   NA 131 (L) 07/29/2020 0134   K 3.7 07/29/2020 0134   CL 99 07/29/2020 0134   CO2 22 07/29/2020 0134   GLUCOSE 103 (H) 07/29/2020 0134   BUN 10 07/29/2020 0134   CREATININE 0.65 07/29/2020 0134   CREATININE 0.66 08/24/2019 1125   CALCIUM 8.7 (L) 07/29/2020 0134   PROT 6.1 (L) 07/29/2020 0134   ALBUMIN 2.9 (L) 07/29/2020 0134   AST 30 07/29/2020 0134   ALT 56 (H) 07/29/2020 0134   ALKPHOS 83 07/29/2020 0134   BILITOT 1.0 07/29/2020 0134   GFRNONAA >60 07/29/2020 0134   GFRNONAA 109 03/30/2019 1623   GFRAA 127 03/30/2019 1623    GFR Estimated Creatinine Clearance: 107.2 mL/min (by C-G formula based on SCr of 0.65 mg/dL). No results for input(s): LIPASE, AMYLASE in the last 72 hours. No results for input(s): CKTOTAL, CKMB, CKMBINDEX, TROPONINI in the last 72 hours. Invalid input(s): POCBNP Recent Labs    07/28/20 0331 07/29/20 0134  DDIMER <0.27 <0.27   No results for input(s): HGBA1C in the last 72 hours. No results for input(s): CHOL, HDL, LDLCALC, TRIG, CHOLHDL, LDLDIRECT in the last 72 hours. No results for input(s): TSH, T4TOTAL, T3FREE, THYROIDAB in the last 72 hours.  Invalid input(s): FREET3 No results for input(s): VITAMINB12, FOLATE, FERRITIN, TIBC, IRON, RETICCTPCT in the  last 72 hours. Coags: No results for input(s): INR in the last 72 hours.  Invalid input(s): PT Microbiology: Recent Results (from the past 240 hour(s))  Resp Panel by RT-PCR (Flu A&B, Covid) Nasopharyngeal Swab     Status: Abnormal   Collection Time: 07/24/20  2:18 PM   Specimen: Nasopharyngeal Swab; Nasopharyngeal(NP) swabs in vial transport medium  Result Value Ref Range Status   SARS Coronavirus 2 by RT PCR POSITIVE (A) NEGATIVE Final    Comment: RESULT CALLED TO, READ BACK BY AND VERIFIED WITH: RN DSP [id number] FCP (NOTE) SARS-CoV-2 target nucleic acids are DETECTED.  The SARS-CoV-2 RNA is generally detectable in upper respiratory specimens during the acute phase of infection. Positive results are indicative of the presence of the identified virus, but do not rule out bacterial infection or co-infection with other pathogens not detected by the test. Clinical correlation with patient history and other diagnostic information is necessary to determine patient infection status. The expected result is Negative.  Fact Sheet for Patients: BloggerCourse.com  Fact Sheet for Healthcare Providers: SeriousBroker.it  This test is not yet approved or cleared by the [place] FDA and  has been authorized for detection and/or diagnosis of SARS-CoV-2 by FDA under an Emergency Use Authorization (EUA).  This EUA will remain in effect (meaning this test can be used) for  the duration of  the COVID-19 declaration under Section 564(b)(1) of the Act, 21 U.S.C. section 360bbb-3(b)(1), unless the authorization is terminated or revoked sooner.     Influenza A by PCR NEGATIVE NEGATIVE Final   Influenza B by PCR NEGATIVE NEGATIVE Final    Comment: (NOTE) The Xpert Xpress SARS-CoV-2/FLU/RSV plus assay is intended as an aid in the diagnosis of influenza from Nasopharyngeal swab specimens and should not be used as a sole basis for  treatment.  Nasal washings and aspirates are unacceptable for Xpert Xpress SARS-CoV-2/FLU/RSV testing.  Fact Sheet for Patients: BloggerCourse.com  Fact Sheet for Healthcare Providers: SeriousBroker.it  This test is not yet approved or cleared by the [place] FDA and has been authorized for detection and/or diagnosis of SARS-CoV-2 by FDA under an Emergency Use Authorization (EUA). This EUA will remain in effect (meaning this test can be used) for the duration of the COVID-19 declaration under Section 564(b)(1) of the Act, 21 U.S.C. section 360bbb-3(b)(1), unless the authorization is terminated or revoked.  Performed at [facility], [address], [place], [place] [address]   Blood Culture (routine x 2)     Status: None   Collection Time: 07/24/20  4:30 PM   Specimen: BLOOD  Result Value Ref Range Status   Specimen Description BLOOD SITE NOT SPECIFIED  Final   Special Requests   Final    BOTTLES DRAWN AEROBIC AND ANAEROBIC Blood Culture adequate volume   Culture   Final    NO GROWTH 5 DAYS Performed at [facility], [address], [place], [place] [address]    Report Status 07/29/2020 FINAL  Final  Blood Culture (routine x 2)     Status: None   Collection Time: 07/24/20  4:45 PM   Specimen: BLOOD  Result Value Ref Range Status   Specimen Description BLOOD SITE NOT SPECIFIED  Final   Special Requests   Final    BOTTLES DRAWN AEROBIC AND ANAEROBIC Blood Culture adequate volume   Culture   Final    NO GROWTH 5 DAYS Performed at [facility], [address] [place], [place] [address]    Report Status 07/29/2020 FINAL  Final  MRSA PCR Screening     Status: None   Collection Time: 07/24/20  9:30 PM   Specimen: Nasal Mucosa; Nasopharyngeal  Result Value Ref Range Status   MRSA by PCR NEGATIVE NEGATIVE Final    Comment:        The GeneXpert MRSA Assay (FDA approved for NASAL specimens only), is one component  of a comprehensive MRSA colonization surveillance program. It is not intended to diagnose MRSA infection nor to guide or monitor treatment for MRSA infections. Performed at [facility], [address], [place], [place] [address]   CSF culture w Gram Stain     Status: None (Preliminary result)   Collection Time: 07/28/20 12:10 PM   Specimen: PATH Cytology CSF; Cerebrospinal Fluid  Result Value Ref Range Status   Specimen Description CSF  Final   Special Requests NONE  Final   Gram Stain   Final    WBC PRESENT,BOTH PMN AND MONONUCLEAR NO ORGANISMS SEEN CYTOSPIN SMEAR    Culture   Final    NO GROWTH 2 DAYS Performed at [facility], [address] [place], [place] [address]    Report Status PENDING  Incomplete  Culture, fungus without smear     Status: None (Preliminary result)   Collection Time: 07/28/20 12:10 PM   Specimen: PATH Cytology CSF; Cerebrospinal Fluid  Result Value Ref Range Status   Specimen Description CSF  Final   Special Requests NONE  Final   Culture   Final    NO FUNGUS ISOLATED AFTER 1 DAY Performed at [facility], [address], [place], [place] [address]    Report Status PENDING  Incomplete    FURTHER DISCHARGE INSTRUCTIONS:  Get Medicines reviewed and adjusted: Please take  all your medications with you for your next visit with your Primary MD  Laboratory/radiological data: Please request your Primary MD to go over all hospital tests and procedure/radiological results at the follow up, please ask your Primary MD to get all Hospital records sent to his/her office.  In some cases, they will be blood work, cultures and biopsy results pending at the time of your discharge. Please request that your primary care M.D. goes through all the records of your hospital data and follows up on these results.  Also Note the following: If you experience worsening of your admission symptoms, develop shortness of breath, life threatening  emergency, suicidal or homicidal thoughts you must seek medical attention immediately by calling 911 or calling your MD immediately  if symptoms less severe.  You must read complete instructions/literature along with all the possible adverse reactions/side effects for all the Medicines you take and that have been prescribed to you. Take any new Medicines after you have completely understood and accpet all the possible adverse reactions/side effects.   Do not drive when taking Pain medications or sleeping medications (Benzodaizepines)  Do not take more than prescribed Pain, Sleep and Anxiety Medications. It is not advisable to combine anxiety,sleep and pain medications without talking with your primary care practitioner  Special Instructions: If you have smoked or chewed Tobacco  in the last 2 yrs please stop smoking, stop any regular Alcohol  and or any Recreational drug use.  Wear Seat belts while driving.  Please note: You were cared for by a hospitalist during your hospital stay. Once you are discharged, your primary care physician will handle any further medical issues. Please note that NO REFILLS for any discharge medications will be authorized once you are discharged, as it is imperative that you return to your primary care physician (or establish a relationship with a primary care physician if you do not have one) for your post hospital discharge needs so that they can reassess your need for medications and monitor your lab values.  Total Time spent coordinating discharge including counseling, education and face to face time equals 35 minutes.  SignedJeoffrey Massed 07/30/2020 12:22 PM

## 2020-07-31 LAB — CSF CULTURE W GRAM STAIN: Culture: NO GROWTH

## 2020-07-31 LAB — VDRL, CSF: VDRL Quant, CSF: NONREACTIVE

## 2020-08-06 ENCOUNTER — Inpatient Hospital Stay: Payer: Self-pay | Admitting: Internal Medicine

## 2020-08-06 ENCOUNTER — Telehealth: Payer: Self-pay

## 2020-08-06 NOTE — Telephone Encounter (Signed)
Attempted to reach patient to reschedule hospital follow up visit, left patient a voicemail to return call for rescheduling.  [place]

## 2020-08-09 ENCOUNTER — Encounter: Payer: Self-pay | Admitting: Family

## 2020-08-09 ENCOUNTER — Other Ambulatory Visit: Payer: Self-pay

## 2020-08-09 ENCOUNTER — Ambulatory Visit (INDEPENDENT_AMBULATORY_CARE_PROVIDER_SITE_OTHER): Payer: Self-pay | Admitting: Family

## 2020-08-09 VITALS — BP 123/84 | HR 97 | Wt 143.0 lb

## 2020-08-09 DIAGNOSIS — Z0000 Encounter for general adult medical examination without abnormal findings: Secondary | ICD-10-CM

## 2020-08-09 DIAGNOSIS — Z8616 Personal history of COVID-19: Secondary | ICD-10-CM

## 2020-08-09 DIAGNOSIS — B20 Human immunodeficiency virus [HIV] disease: Secondary | ICD-10-CM

## 2020-08-09 DIAGNOSIS — U071 COVID-19: Secondary | ICD-10-CM

## 2020-08-09 DIAGNOSIS — B370 Candidal stomatitis: Secondary | ICD-10-CM

## 2020-08-09 DIAGNOSIS — A4189 Other specified sepsis: Secondary | ICD-10-CM

## 2020-08-09 MED ORDER — SULFAMETHOXAZOLE-TRIMETHOPRIM 400-80 MG PO TABS: 1.0000 | ORAL_TABLET | Freq: Every day | ORAL | 5 refills | Status: DC

## 2020-08-09 MED ORDER — DARUN-COBIC-EMTRICIT-TENOFAF 800-150-200-10 MG PO TABS: 1.0000 | ORAL_TABLET | Freq: Every day | ORAL | 2 refills | Status: DC

## 2020-08-09 NOTE — Patient Instructions (Addendum)
Nice to see you.   We will continue your current dose of Symtuza, Azithromycin and Bactrim.  Complete the FLUCONAZOLE - no additional refills needed.   We will renew your financial assistance today.   Plan for follow up in 1 month or sooner if needed with lab work on the same day.   Follow up with me or Dr. Daiva Eves.

## 2020-08-09 NOTE — Progress Notes (Signed)
Subjective:    Patient ID: [facility], male    DOB: 1975-06-12, 45 y.o.   MRN: [id number]  Chief Complaint  Patient presents with  . HIV Positive/AIDS  . Covid    HPI:  [facility] Jon Love is a 45 y.o. male with HIV/AIDS who wast last seen in the [facility] clinic on 04/25/19 with undetectable viral load and CD4 count of 36. Recently admitted to [facility] with Covid-19 and worsening shortness of breath, fever, malaise and diarrhea. CD4 count found to be <35 and viral load of 5130. Cryptococcal antigen was negative. Found to have thrush. Started on Symtuza, Bactrim and Azithromycin. Discharged with 30 day supply of medication with clinic follow up. Here today for hospitalization follow up. [name] primary preferred language is Spanish and a medical interpreter is present to aid in communciation.   [name] has been taking his Symtuza, Bactrim, Azithromycin and fluconazole as prescribed. Thrush significantly improved and has one dose of fluconazole remaining. Feeling well today. Has had some occasional dizziness at times with azithromycin and other medications. Denies fevers, chills, night sweats, headaches, changes in vision, neck pain/stiffness, nausea, diarrhea, vomiting, lesions or rashes..  [name] will have his financial assistance renewed today. Has had feelings of being down. No suicidal ideations. May be interested in seeing counselor. No recreational or illicit drug use, tobacco use or alcohol consumption. Due for routine dental care. Housing and access to food are stable. Wishing to return to work.    Allergies  Allergen Reactions  . Penicillins Hives and Itching    Has patient had a PCN reaction causing immediate rash, facial/tongue/throat swelling, SOB or lightheadedness with hypotension: Yes Has patient had a PCN reaction causing severe rash involving mucus membranes or skin necrosis: No Has patient had a PCN reaction that required  hospitalization No Has patient had a PCN reaction occurring within the last 10 years: No If all of the above answers are "NO", then may proceed with Cephalosporin use.      Outpatient Medications Prior to Visit  Medication Sig Dispense Refill  . azithromycin (ZITHROMAX) 600 MG tablet Take 2 tablets (1,200 mg total) by mouth once a week. 30 tablet 6  . fluconazole (DIFLUCAN) 200 MG tablet Take 1 tablet (200 mg total) by mouth daily. 10 tablet 0  . Darunavir-Cobicisctat-Emtricitabine-Tenofovir Alafenamide (SYMTUZA) 800-150-200-10 MG TABS Take 1 tablet by mouth daily with breakfast. 30 tablet 0  . sulfamethoxazole-trimethoprim (BACTRIM) 400-80 MG tablet Take 1 tablet by mouth daily. 30 tablet 0   No facility-administered medications prior to visit.     Past Medical History:  Diagnosis Date  . HIV disease ([place])   . Known health problems: none       Past Surgical History:  Procedure Laterality Date  . CHOLECYSTECTOMY N/A 10/11/2014   Procedure: LAPAROSCOPIC CHOLECYSTECTOMY WITH INTRAOPERATIVE CHOLANGIOGRAM;  Surgeon: Coralie Keens, MD;  Location: [facility];  Service: General;  Laterality: N/A;  . ERCP N/A 10/09/2014   Procedure: ENDOSCOPIC RETROGRADE CHOLANGIOPANCREATOGRAPHY (ERCP);  Surgeon: Carol Ada, MD;  Location: [facility] ENDOSCOPY;  Service: Endoscopy;  Laterality: N/A;      History reviewed. No pertinent family history.    Social History   Socioeconomic History  . Marital status: Single    Spouse name: Not on file  . Number of children: Not on file  . Years of education: Not on file  . Highest education level: Not on file  Occupational History  . Not on file  Tobacco Use  .  Smoking status: Never Smoker  . Smokeless tobacco: Never Used  Vaping Use  . Vaping Use: Never used  Substance and Sexual Activity  . Alcohol use: Yes    Alcohol/week: 1.0 standard drink    Types: 1 Cans of beer per week    Comment: socially  . Drug use: Not Currently  . Sexual activity: Not  Currently    Partners: Female    Comment: offered condoms 03/2019  Other Topics Concern  . Not on file  Social History Narrative   ** Merged History Encounter **       Social Determinants of Health   Financial Resource Strain: Not on file  Food Insecurity: Not on file  Transportation Needs: Not on file  Physical Activity: Not on file  Stress: Not on file  Social Connections: Not on file  Intimate Partner Violence: Not on file      Review of Systems  Constitutional: Negative for appetite change, chills, fatigue, fever and unexpected weight change.  Eyes: Negative for visual disturbance.  Respiratory: Negative for cough, chest tightness, shortness of breath and wheezing.   Cardiovascular: Negative for chest pain and leg swelling.  Gastrointestinal: Negative for abdominal pain, constipation, diarrhea, nausea and vomiting.  Genitourinary: Negative for dysuria, flank pain, frequency, genital sores, hematuria and urgency.  Skin: Negative for rash.  Allergic/Immunologic: Negative for immunocompromised state.  Neurological: Positive for headaches. Negative for dizziness.       Objective:    BP 123/84   Pulse 97   Wt 143 lb (64.9 kg)   BMI 20.52 kg/m  Nursing note and vital signs reviewed.  Physical Exam Constitutional:      General: He is not in acute distress.    Appearance: He is well-developed.  HENT:     Mouth/Throat:     Mouth: Oropharynx is clear and moist.  Eyes:     Conjunctiva/sclera: Conjunctivae normal.  Cardiovascular:     Rate and Rhythm: Normal rate and regular rhythm.     Pulses: Intact distal pulses.     Heart sounds: Normal heart sounds. No murmur heard. No friction rub. No gallop.   Pulmonary:     Effort: Pulmonary effort is normal. No respiratory distress.     Breath sounds: Normal breath sounds. No wheezing or rales.  Chest:     Chest wall: No tenderness.  Abdominal:     General: Bowel sounds are normal.     Palpations: Abdomen is soft.      Tenderness: There is no abdominal tenderness.  Musculoskeletal:     Cervical back: Neck supple.  Lymphadenopathy:     Cervical: No cervical adenopathy.  Skin:    General: Skin is warm and dry.     Findings: No rash.  Neurological:     Mental Status: He is alert and oriented to person, place, and time.  Psychiatric:        Mood and Affect: Mood and affect normal.         Assessment & Plan:   Patient Active Problem List   Diagnosis Date Noted  . Healthcare maintenance 08/09/2020  . Sepsis due to COVID-19 ([place]) 07/24/2020  . Immigrant with language difficulty   . Cachexia ([place])   . Poor dentition   . Multifocal pneumonia 03/13/2019  . HIV infection ([place]) 12/08/2018  . Microcytic anemia 12/08/2018  . Cavitary pneumonia 12/05/2018  . Alcohol use 12/05/2018  . Thrush of mouth and esophagus ([place]) 03/21/2016  . Anemia 08/16/2015  . AIDS (acquired  immune deficiency syndrome) ([place])   . Thrush   . Gastroesophageal reflux disease without esophagitis   . Streptococcal pneumonia ([place]) 03/17/2015  . Gout 10/12/2014  . Cholelithiasis 10/07/2014  . Protein-calorie malnutrition, severe ([place]) 10/07/2014  . 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 Choledocholithiasis   . Abnormal transaminases 10/06/2014     Problem List Items Addressed This Visit      Digestive   Thrush    [name] has no further evidence of thrush infection. He will complete his last does of fluconazole. Remains at risk for re-infection given his poor CD4 count.       Relevant Medications   Darunavir-Cobicisctat-Emtricitabine-Tenofovir Alafenamide (SYMTUZA) 800-150-200-10 MG TABS   sulfamethoxazole-trimethoprim (BACTRIM) 400-80 MG tablet     Other   AIDS (acquired immune deficiency syndrome) [facility])    [name] has been taking his Symtuza, Bactrim and Azithromycin as prescribed. Discussed importance of continuing to take medication. Thrush appears to be resolved with fluconazole and has no other  signs of opportunistic infections. Appears to have confusion and misunderstanding at times which is consistent with Dr. Derek Mound previous observations. Financial assistance to be renewed today. He met with [place] for Case Management. Continue current dose of Symtuza, Bactrim and Azithromycin. Will arrange follow up in 1 month or sooner if needed.      Relevant Medications   Darunavir-Cobicisctat-Emtricitabine-Tenofovir Alafenamide (SYMTUZA) 800-150-200-10 MG TABS   sulfamethoxazole-trimethoprim (BACTRIM) 400-80 MG tablet   Sepsis due to COVID-19 ([place])    Symptoms appear to have resolved. Note for work provided as he is no longer contagious.       Healthcare maintenance     Discussed importance of safe sexual practice to reduce risk of STI. Condoms declined.   Due for routine dental care and referral placed to [facility].           I am having [facility] maintain his azithromycin, fluconazole, Darunavir-Cobicisctat-Emtricitabine-Tenofovir Alafenamide, and sulfamethoxazole-trimethoprim.   Meds ordered this encounter  Medications  . Darunavir-Cobicisctat-Emtricitabine-Tenofovir Alafenamide (SYMTUZA) 800-150-200-10 MG TABS    Sig: Take 1 tablet by mouth daily with breakfast.    Dispense:  30 tablet    Refill:  2    [id number] Group: [id number] [id number]    Order Specific Question:   Supervising Provider    Answer:   Carlyle Basques [[id number]]  . sulfamethoxazole-trimethoprim (BACTRIM) 400-80 MG tablet    Sig: Take 1 tablet by mouth daily.    Dispense:  30 tablet    Refill:  5    Order Specific Question:   Supervising Provider    Answer:   Carlyle Basques [[id number]]     Follow-up: Return in about 1 month (around 09/09/2020), or if symptoms worsen or fail to improve.    Terri Piedra, MSN, FNP-C Nurse Practitioner [facility] for Infectious Disease [place] number: [phone]

## 2020-08-09 NOTE — Assessment & Plan Note (Signed)
[name] has no further evidence of thrush infection. He will complete his last does of fluconazole. Remains at risk for re-infection given his poor CD4 count.

## 2020-08-09 NOTE — Assessment & Plan Note (Signed)
   Discussed importance of safe sexual practice to reduce risk of STI. Condoms declined.   Due for routine dental care and referral placed to [facility] dental clinic.

## 2020-08-09 NOTE — Assessment & Plan Note (Signed)
Symptoms appear to have resolved. Note for work provided as he is no longer contagious.

## 2020-08-09 NOTE — Assessment & Plan Note (Signed)
Jon Love has been taking his Symtuza, Bactrim and Azithromycin as prescribed. Discussed importance of continuing to take medication. Thrush appears to be resolved with fluconazole and has no other signs of opportunistic infections. Appears to have confusion and misunderstanding at times which is consistent with Dr. Derek Mound previous observations. Financial assistance to be renewed today. 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 Continue current dose of Symtuza, Bactrim and Azithromycin. Will arrange follow up in 1 month or sooner if needed.

## 2020-08-10 ENCOUNTER — Encounter: Payer: Self-pay | Admitting: Infectious Disease

## 2020-08-13 LAB — REFLEX TO GENOSURE(R) MG EDI: HIV GenoSure(R): 1

## 2020-08-13 LAB — HIV GENOSURE(R) MG

## 2020-08-13 LAB — HIV-1 RNA, PCR (GRAPH) RFX/GENO EDI
HIV-1 RNA BY PCR: 6570 copies/mL
HIV-1 RNA Quant, Log: 3.818 log10copy/mL

## 2020-08-20 LAB — CULTURE, FUNGUS WITHOUT SMEAR

## 2020-09-10 ENCOUNTER — Ambulatory Visit: Payer: Self-pay | Admitting: Infectious Disease

## 2021-02-08 ENCOUNTER — Ambulatory Visit: Payer: Self-pay

## 2021-02-08 ENCOUNTER — Ambulatory Visit (INDEPENDENT_AMBULATORY_CARE_PROVIDER_SITE_OTHER): Payer: Self-pay | Admitting: Internal Medicine

## 2021-02-08 ENCOUNTER — Other Ambulatory Visit: Payer: Self-pay

## 2021-02-08 ENCOUNTER — Encounter: Payer: Self-pay | Admitting: Internal Medicine

## 2021-02-08 DIAGNOSIS — B3781 Candidal esophagitis: Secondary | ICD-10-CM

## 2021-02-08 DIAGNOSIS — B370 Candidal stomatitis: Secondary | ICD-10-CM

## 2021-02-08 DIAGNOSIS — Z113 Encounter for screening for infections with a predominantly sexual mode of transmission: Secondary | ICD-10-CM

## 2021-02-08 DIAGNOSIS — K219 Gastro-esophageal reflux disease without esophagitis: Secondary | ICD-10-CM

## 2021-02-08 DIAGNOSIS — B20 Human immunodeficiency virus [HIV] disease: Secondary | ICD-10-CM

## 2021-02-08 DIAGNOSIS — Z9189 Other specified personal risk factors, not elsewhere classified: Secondary | ICD-10-CM

## 2021-02-08 DIAGNOSIS — Z79899 Other long term (current) drug therapy: Secondary | ICD-10-CM | POA: Insufficient documentation

## 2021-02-08 MED ORDER — OMEPRAZOLE 20 MG PO CPDR: 20.0000 mg | DELAYED_RELEASE_CAPSULE | Freq: Every day | ORAL | 2 refills | Status: AC

## 2021-02-08 MED ORDER — FLUCONAZOLE 200 MG PO TABS: 200.0000 mg | ORAL_TABLET | Freq: Every day | ORAL | 0 refills | Status: DC

## 2021-02-08 MED ORDER — DARUN-COBIC-EMTRICIT-TENOFAF 800-150-200-10 MG PO TABS: 1.0000 | ORAL_TABLET | Freq: Every day | ORAL | 2 refills | Status: DC

## 2021-02-08 MED ORDER — SULFAMETHOXAZOLE-TRIMETHOPRIM 400-80 MG PO TABS: 1.0000 | ORAL_TABLET | Freq: Every day | ORAL | 5 refills | Status: AC

## 2021-02-08 NOTE — Assessment & Plan Note (Signed)
Uncontrolled since he has not been able to get to town to get his medications.  Will restart today and I emphasized he needs to get her every month for his refills and he voiced his understanding.

## 2021-02-08 NOTE — Assessment & Plan Note (Signed)
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

## 2021-02-08 NOTE — Assessment & Plan Note (Signed)
Will screen 

## 2021-02-08 NOTE — Progress Notes (Signed)
   Subjective:    Patient ID: [facility], male    DOB: [birth year], 45 y.o.   MRN: [id number]  HPI He is here for a work in visit He has been out of care and off his medications for about 2 months.  He has been working in [place] and unable to get here to get his medications.  He has no transportation of his own and has to rely on a ride.  His last CD4 was already < 35.  He also has been on Bactrim.  He is complaining of mid epigastric burning abdominal pain.  He is complaining of a white coating in his mouth and trouble swallowing.     Review of Systems  Constitutional:  Negative for fatigue and unexpected weight change.  HENT:  Positive for trouble swallowing.   Gastrointestinal:  Negative for diarrhea and nausea.       Reflux  Skin:  Negative for rash.      Objective:   Physical Exam HENT:     Mouth/Throat:     Comments: + thrush on palate, tongue Eyes:     General: No scleral icterus. Pulmonary:     Effort: Pulmonary effort is normal.  Skin:    Findings: No rash.  Neurological:     General: No focal deficit present.     Mental Status: He is alert.  Psychiatric:        Mood and Affect: Mood normal.   SH: no tobacco       Assessment & Plan:

## 2021-02-08 NOTE — Assessment & Plan Note (Signed)
New this visit.  I will give him fluconazole for 14 days.

## 2021-02-08 NOTE — Assessment & Plan Note (Signed)
I will refill the Bactrim

## 2021-02-12 LAB — COMPLETE METABOLIC PANEL WITH GFR
AG Ratio: 1.5 (calc) (ref 1.0–2.5)
ALT: 110 U/L — ABNORMAL HIGH (ref 9–46)
AST: 111 U/L — ABNORMAL HIGH (ref 10–40)
Albumin: 4.5 g/dL (ref 3.6–5.1)
Alkaline phosphatase (APISO): 135 U/L — ABNORMAL HIGH (ref 36–130)
BUN: 11 mg/dL (ref 7–25)
CO2: 27 mmol/L (ref 20–32)
Calcium: 9.8 mg/dL (ref 8.6–10.3)
Chloride: 98 mmol/L (ref 98–110)
Creat: 0.78 mg/dL (ref 0.60–1.29)
Globulin: 3 g/dL (calc) (ref 1.9–3.7)
Glucose, Bld: 93 mg/dL (ref 65–99)
Potassium: 4.9 mmol/L (ref 3.5–5.3)
Sodium: 135 mmol/L (ref 135–146)
Total Bilirubin: 2.1 mg/dL — ABNORMAL HIGH (ref 0.2–1.2)
Total Protein: 7.5 g/dL (ref 6.1–8.1)
eGFR: 112 mL/min/{1.73_m2} (ref >=60)

## 2021-02-12 LAB — T-HELPER CELLS (CD4) COUNT (NOT AT ARMC)
Absolute CD4: 37 cells/uL — ABNORMAL LOW (ref 490–1740)
CD4 T Helper %: 2 % — ABNORMAL LOW (ref 30–61)
Total lymphocyte count: 2159 cells/uL (ref 850–3900)

## 2021-02-12 LAB — CBC WITH DIFFERENTIAL/PLATELET
Absolute Monocytes: 767 cells/uL (ref 200–950)
Basophils Absolute: 33 cells/uL (ref 0–200)
Basophils Relative: 0.5 %
Eosinophils Absolute: 787 cells/uL — ABNORMAL HIGH (ref 15–500)
Eosinophils Relative: 12.1 %
HCT: 45.7 % (ref 38.5–50.0)
Hemoglobin: 15 g/dL (ref 13.2–17.1)
Lymphs Abs: 2425 cells/uL (ref 850–3900)
MCH: 27 pg (ref 27.0–33.0)
MCHC: 32.8 g/dL (ref 32.0–36.0)
MCV: 82.3 fL (ref 80.0–100.0)
MPV: 11.2 fL (ref 7.5–12.5)
Monocytes Relative: 11.8 %
Neutro Abs: 2490 cells/uL (ref 1500–7800)
Neutrophils Relative %: 38.3 %
Platelets: 214 10*3/uL (ref 140–400)
RBC: 5.55 10*6/uL (ref 4.20–5.80)
RDW: 15.6 % — ABNORMAL HIGH (ref 11.0–15.0)
Total Lymphocyte: 37.3 %
WBC: 6.5 10*3/uL (ref 3.8–10.8)

## 2021-02-12 LAB — URINE CYTOLOGY ANCILLARY ONLY
Chlamydia: NEGATIVE
Comment: NEGATIVE
Comment: NORMAL
Neisseria Gonorrhea: NEGATIVE

## 2021-02-12 LAB — HIV-1 RNA QUANT-NO REFLEX-BLD
HIV 1 RNA Quant: 37900 Copies/mL — ABNORMAL HIGH
HIV-1 RNA Quant, Log: 4.58 Log cps/mL — ABNORMAL HIGH

## 2021-02-12 LAB — LIPID PANEL
Cholesterol: 161 mg/dL (ref <200)
HDL: 29 mg/dL — ABNORMAL LOW (ref >=40)
LDL Cholesterol (Calc): 102 mg/dL (calc) — ABNORMAL HIGH
Non-HDL Cholesterol (Calc): 132 mg/dL (calc) — ABNORMAL HIGH (ref <130)
Total CHOL/HDL Ratio: 5.6 (calc) — ABNORMAL HIGH (ref <5.0)
Triglycerides: 188 mg/dL — ABNORMAL HIGH (ref <150)

## 2021-02-12 LAB — RPR: RPR Ser Ql: NONREACTIVE

## 2021-02-21 ENCOUNTER — Emergency Department (HOSPITAL_COMMUNITY): Payer: Self-pay

## 2021-02-21 ENCOUNTER — Inpatient Hospital Stay (HOSPITAL_COMMUNITY): Payer: Self-pay

## 2021-02-21 ENCOUNTER — Encounter (HOSPITAL_COMMUNITY): Payer: Self-pay | Admitting: Emergency Medicine

## 2021-02-21 ENCOUNTER — Other Ambulatory Visit: Payer: Self-pay

## 2021-02-21 ENCOUNTER — Inpatient Hospital Stay (HOSPITAL_COMMUNITY)
Admission: EM | Admit: 2021-02-21 | Discharge: 2021-02-26 | DRG: 175 | Disposition: A | Discharge status: Home / Self Care | Payer: Self-pay | Attending: Internal Medicine | Admitting: Internal Medicine

## 2021-02-21 DIAGNOSIS — I2693 Single subsegmental pulmonary embolism without acute cor pulmonale: Principal | ICD-10-CM | POA: Diagnosis present

## 2021-02-21 DIAGNOSIS — F1010 Alcohol abuse, uncomplicated: Secondary | ICD-10-CM | POA: Diagnosis present

## 2021-02-21 DIAGNOSIS — I959 Hypotension, unspecified: Secondary | ICD-10-CM | POA: Diagnosis present

## 2021-02-21 DIAGNOSIS — R7401 Elevation of levels of liver transaminase levels: Secondary | ICD-10-CM

## 2021-02-21 DIAGNOSIS — B370 Candidal stomatitis: Secondary | ICD-10-CM | POA: Diagnosis present

## 2021-02-21 DIAGNOSIS — B20 Human immunodeficiency virus [HIV] disease: Secondary | ICD-10-CM | POA: Diagnosis present

## 2021-02-21 DIAGNOSIS — Z9119 Patient's noncompliance with other medical treatment and regimen: Secondary | ICD-10-CM

## 2021-02-21 DIAGNOSIS — Z20822 Contact with and (suspected) exposure to covid-19: Secondary | ICD-10-CM | POA: Diagnosis present

## 2021-02-21 DIAGNOSIS — K5909 Other constipation: Secondary | ICD-10-CM | POA: Diagnosis present

## 2021-02-21 DIAGNOSIS — Z79899 Other long term (current) drug therapy: Secondary | ICD-10-CM

## 2021-02-21 DIAGNOSIS — I2699 Other pulmonary embolism without acute cor pulmonale: Secondary | ICD-10-CM

## 2021-02-21 DIAGNOSIS — J180 Bronchopneumonia, unspecified organism: Secondary | ICD-10-CM | POA: Diagnosis present

## 2021-02-21 DIAGNOSIS — J189 Pneumonia, unspecified organism: Secondary | ICD-10-CM | POA: Diagnosis present

## 2021-02-21 DIAGNOSIS — Z9049 Acquired absence of other specified parts of digestive tract: Secondary | ICD-10-CM

## 2021-02-21 LAB — CBC WITH DIFFERENTIAL/PLATELET
Abs Immature Granulocytes: 0.2 10*3/uL — ABNORMAL HIGH (ref 0.00–0.07)
Basophils Absolute: 0.1 10*3/uL (ref 0.0–0.1)
Basophils Relative: 1 %
Eosinophils Absolute: 0.3 10*3/uL (ref 0.0–0.5)
Eosinophils Relative: 3 %
HCT: 43.8 % (ref 39.0–52.0)
Hemoglobin: 14.4 g/dL (ref 13.0–17.0)
Immature Granulocytes: 2 %
Lymphocytes Relative: 2 %
Lymphs Abs: 0.2 10*3/uL — ABNORMAL LOW (ref 0.7–4.0)
MCH: 27.4 pg (ref 26.0–34.0)
MCHC: 32.9 g/dL (ref 30.0–36.0)
MCV: 83.4 fL (ref 80.0–100.0)
Monocytes Absolute: 0.3 10*3/uL (ref 0.1–1.0)
Monocytes Relative: 2 %
Neutro Abs: 9.8 10*3/uL — ABNORMAL HIGH (ref 1.7–7.7)
Neutrophils Relative %: 90 %
Platelets: 241 10*3/uL (ref 150–400)
RBC: 5.25 MIL/uL (ref 4.22–5.81)
RDW: 14.4 % (ref 11.5–15.5)
WBC: 10.8 10*3/uL — ABNORMAL HIGH (ref 4.0–10.5)
nRBC: 0 % (ref 0.0–0.2)

## 2021-02-21 LAB — URINALYSIS, ROUTINE W REFLEX MICROSCOPIC
Bilirubin Urine: NEGATIVE
Glucose, UA: NEGATIVE mg/dL
Hgb urine dipstick: NEGATIVE
Ketones, ur: NEGATIVE mg/dL
Leukocytes,Ua: NEGATIVE
Nitrite: NEGATIVE
Protein, ur: NEGATIVE mg/dL
Specific Gravity, Urine: 1.031 — ABNORMAL HIGH (ref 1.005–1.030)
pH: 6 (ref 5.0–8.0)

## 2021-02-21 LAB — COMPREHENSIVE METABOLIC PANEL
ALT: 79 U/L — ABNORMAL HIGH (ref 0–44)
AST: 86 U/L — ABNORMAL HIGH (ref 15–41)
Albumin: 3.5 g/dL (ref 3.5–5.0)
Alkaline Phosphatase: 89 U/L (ref 38–126)
Anion gap: 16 — ABNORMAL HIGH (ref 5–15)
BUN: 19 mg/dL (ref 6–20)
CO2: 25 mmol/L (ref 22–32)
Calcium: 8.8 mg/dL — ABNORMAL LOW (ref 8.9–10.3)
Chloride: 95 mmol/L — ABNORMAL LOW (ref 98–111)
Creatinine, Ser: 1.16 mg/dL (ref 0.61–1.24)
GFR, Estimated: >60 mL/min (ref >60)
Glucose, Bld: 125 mg/dL — ABNORMAL HIGH (ref 70–99)
Potassium: 4.6 mmol/L (ref 3.5–5.1)
Sodium: 136 mmol/L (ref 135–145)
Total Bilirubin: 2.3 mg/dL — ABNORMAL HIGH (ref 0.3–1.2)
Total Protein: 6.7 g/dL (ref 6.5–8.1)

## 2021-02-21 LAB — PROTIME-INR
INR: 1.3 — ABNORMAL HIGH (ref 0.8–1.2)
Prothrombin Time: 16.5 seconds — ABNORMAL HIGH (ref 11.4–15.2)

## 2021-02-21 LAB — APTT: aPTT: 34 seconds (ref 24–36)

## 2021-02-21 LAB — HEPATITIS PANEL, ACUTE
HCV Ab: NONREACTIVE
Hep A IgM: NONREACTIVE
Hep B C IgM: NONREACTIVE
Hepatitis B Surface Ag: NONREACTIVE

## 2021-02-21 LAB — RESP PANEL BY RT-PCR (FLU A&B, COVID) ARPGX2
Influenza A by PCR: NEGATIVE
Influenza B by PCR: NEGATIVE
SARS Coronavirus 2 by RT PCR: NEGATIVE

## 2021-02-21 LAB — T-HELPER CELLS (CD4) COUNT (NOT AT ARMC)
CD4 % Helper T Cell: 4 % — ABNORMAL LOW (ref 33–65)
CD4 T Cell Abs: <35 /uL — ABNORMAL LOW (ref 400–1790)

## 2021-02-21 LAB — LACTIC ACID, PLASMA
Lactic Acid, Venous: 3 mmol/L (ref 0.5–1.9)
Lactic Acid, Venous: 1.3 mmol/L (ref 0.5–1.9)

## 2021-02-21 LAB — LIPASE, BLOOD: Lipase: 24 U/L (ref 11–51)

## 2021-02-21 LAB — HEPARIN LEVEL (UNFRACTIONATED): Heparin Unfractionated: 0.25 IU/mL — ABNORMAL LOW (ref 0.30–0.70)

## 2021-02-21 IMAGING — CR DG LUMBAR SPINE COMPLETE 4+V
5 series · 5 of 5 positions shown · non-contrast
Comparison: MRI lumbar spine [DATE]

CLINICAL DATA: Cough, back pain.  HIV aids.

EXAM:
LUMBAR SPINE - COMPLETE 4+ VIEW

[l-spine ap]
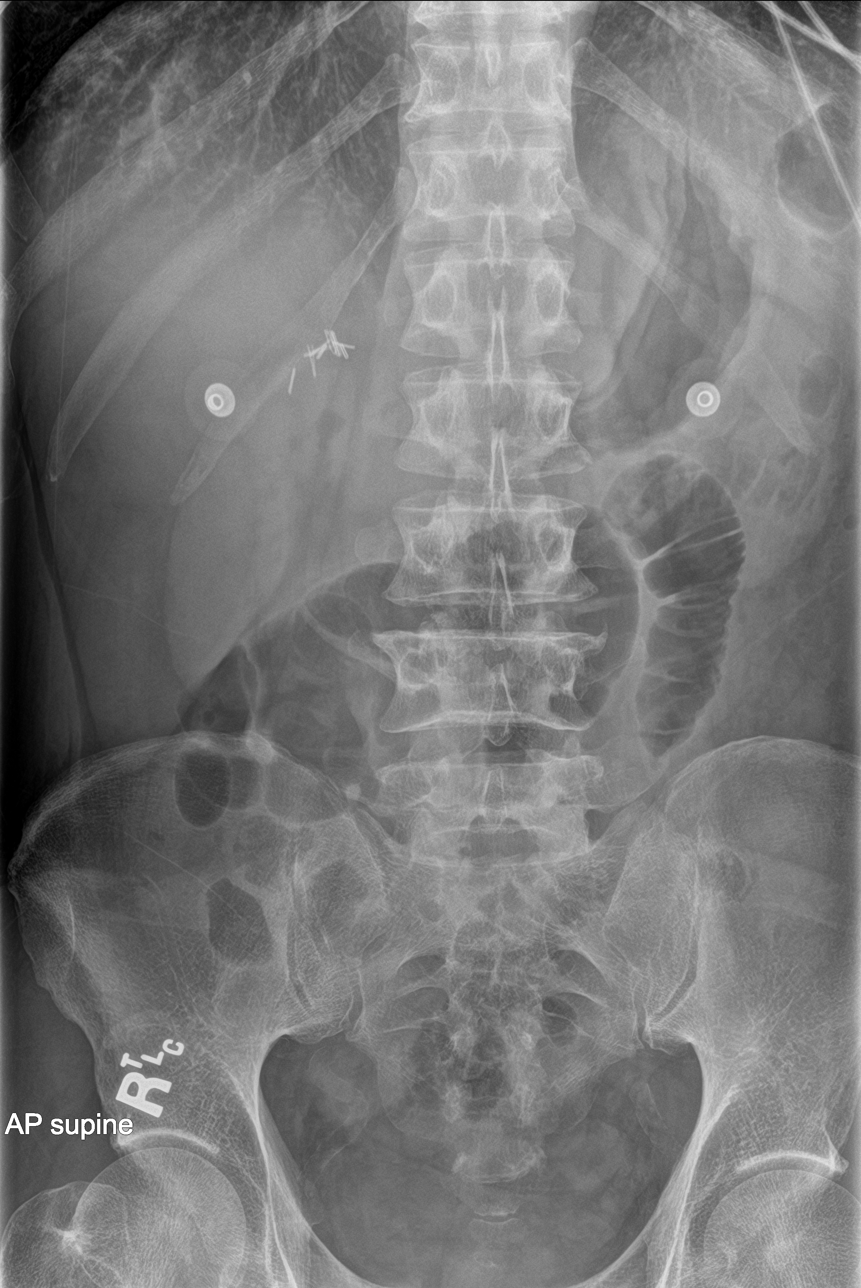

[l-spine obl (1 of 2)]
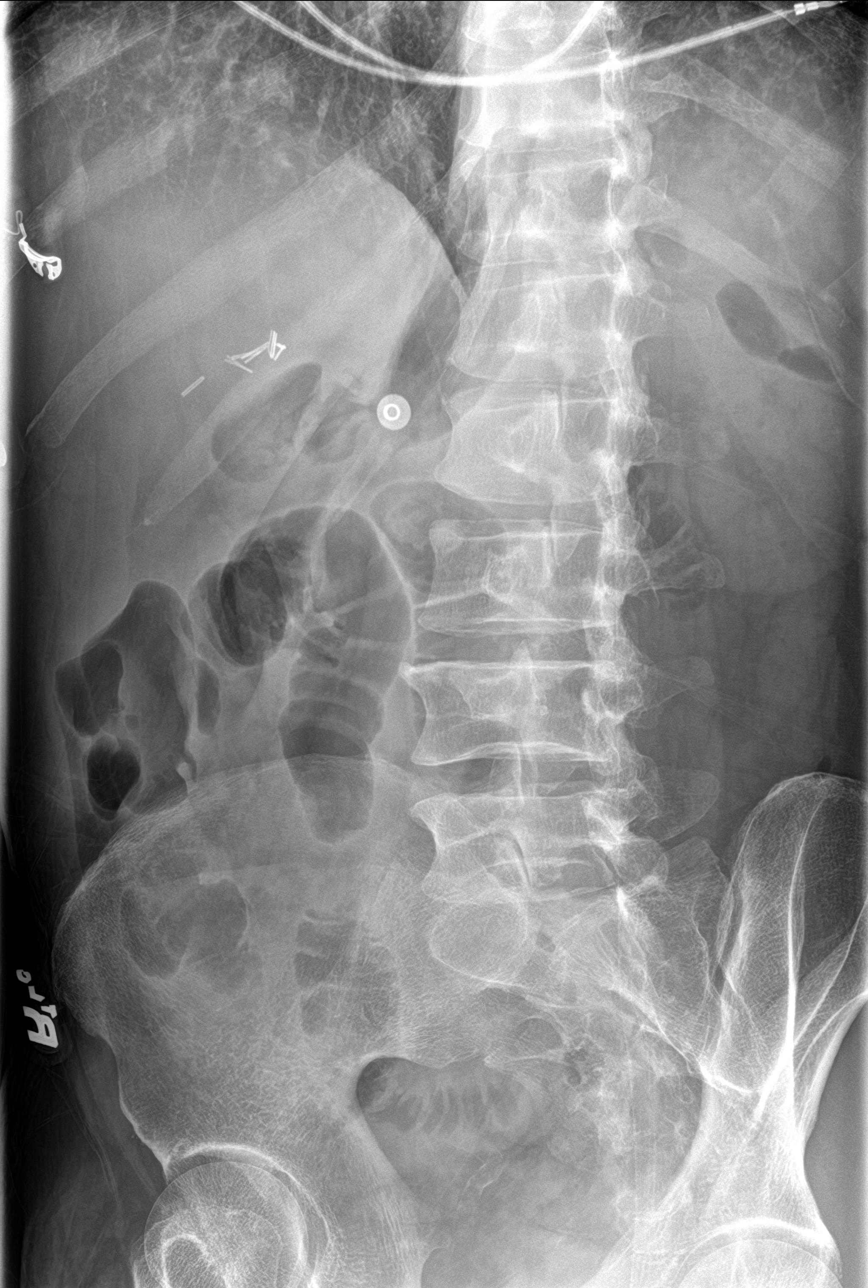

[l-spine obl (2 of 2)]
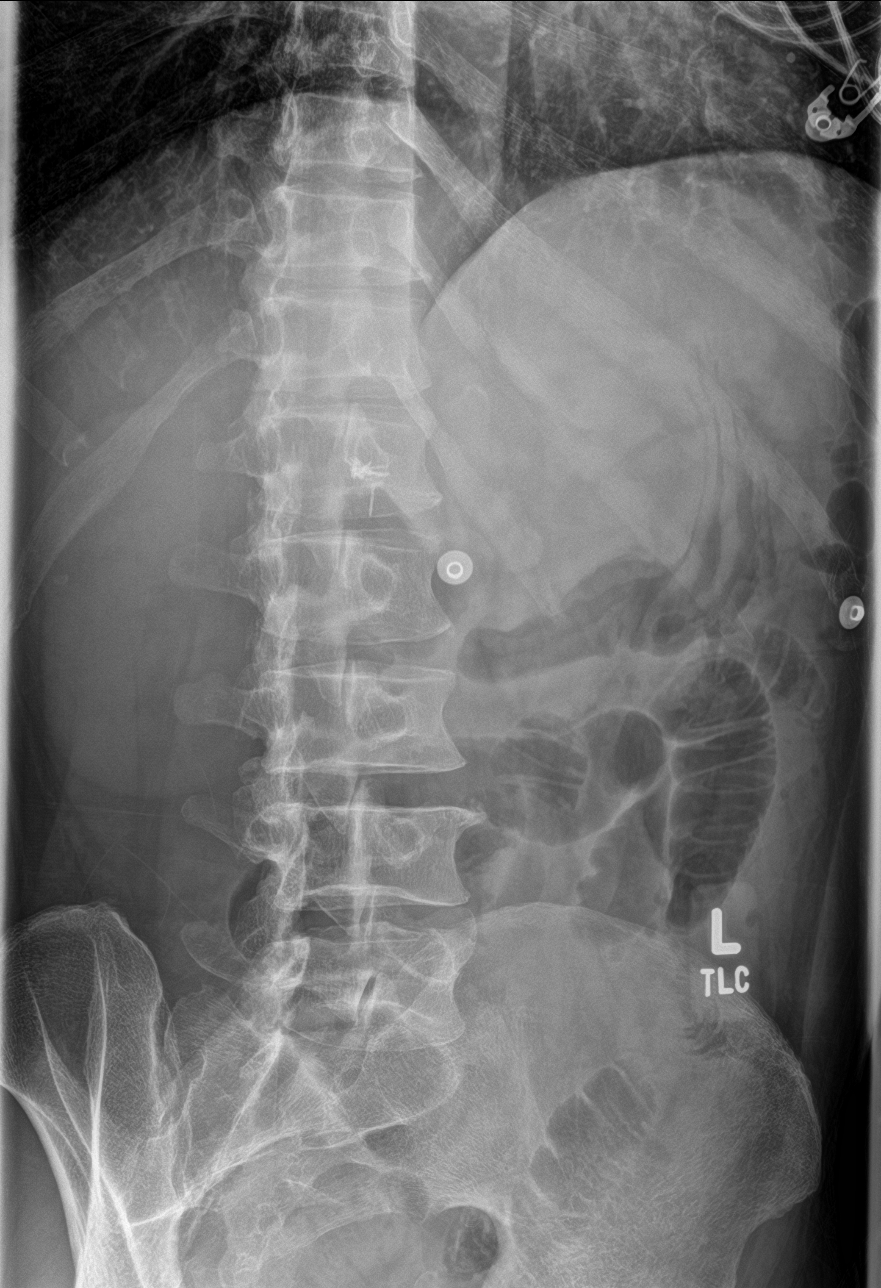

[l-spine lat]
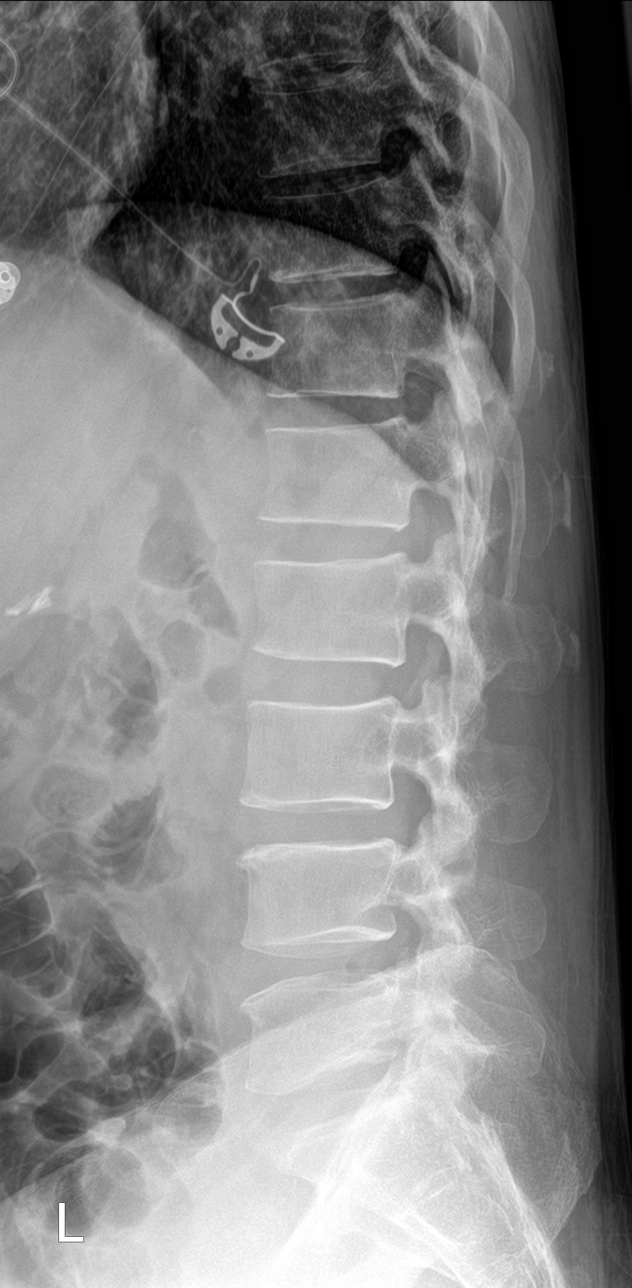

[l-spine spot]
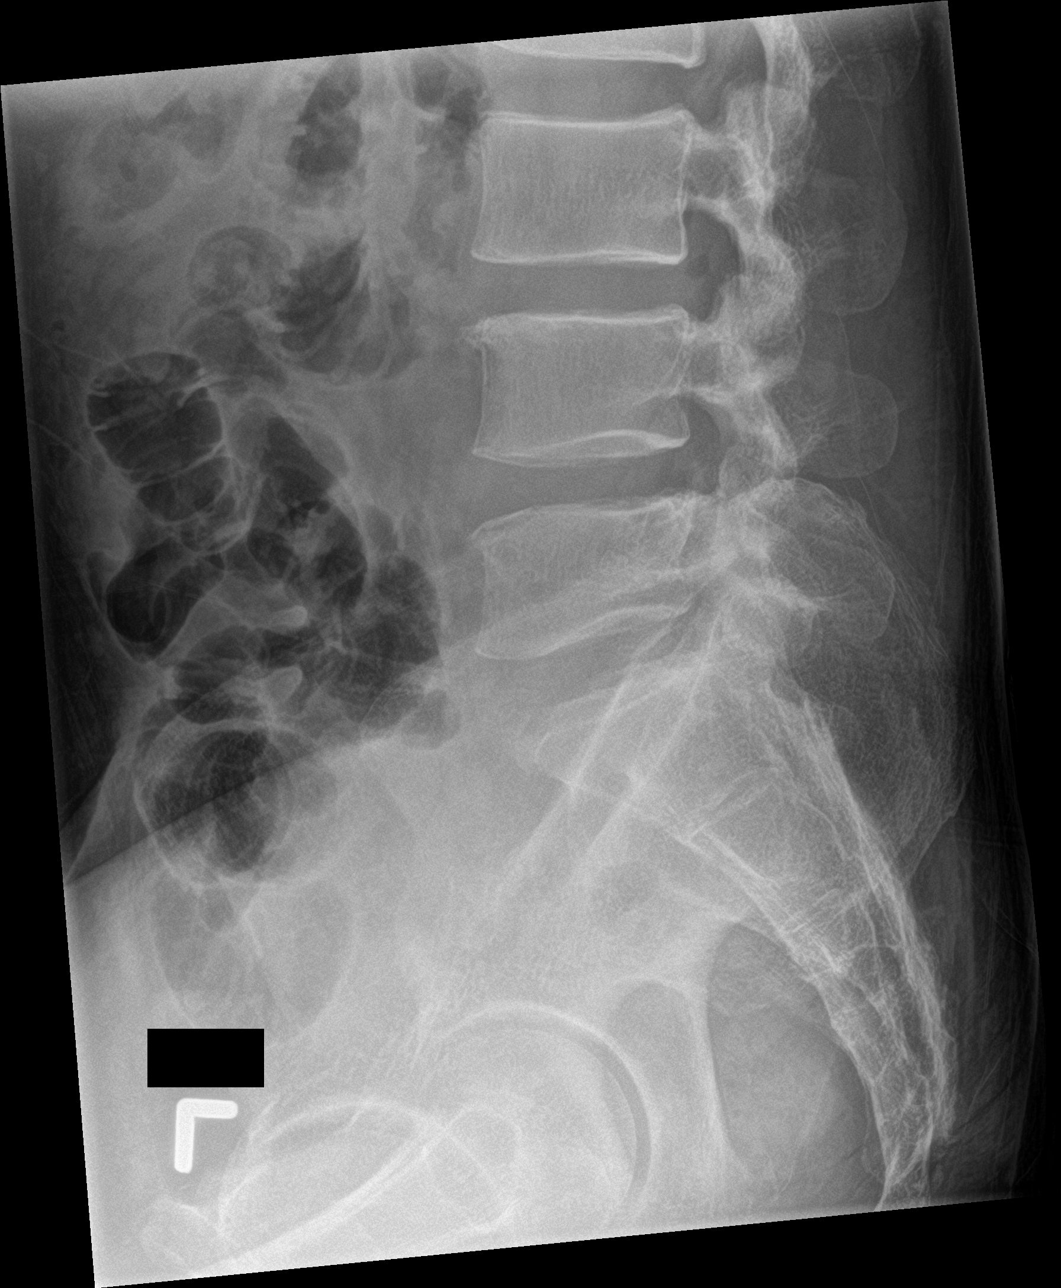

[5 of 5 positions shown; findings below may reference images not displayed]

FINDINGS: There is no evidence of lumbar spine fracture. Alignment is normal.
Intervertebral disc spaces are maintained.
IMPRESSION: Negative.

## 2021-02-21 IMAGING — MR MR THORACIC SPINE WO/W CM
7 of 16 series · 18 of 48 positions shown · IV contrast (gadavist)
Comparison: MRI lumbar spine [DATE]

CLINICAL DATA: Back pain

EXAM:
MRI THORACIC AND LUMBAR SPINE WITHOUT AND WITH CONTRAST
TECHNIQUE: Multiplanar and multiecho pulse sequences of the thoracic and lumbar
spine were obtained without and with intravenous contrast.
CONTRAST:  7.5mL GADAVIST GADOBUTROL 1 MMOL/ML IV SOLN

[Series 2: T1 · sagittal · 3.0mm · 0.90mm/px · 1 of 16 slices shown (1 of 3)]
[im 1/16]
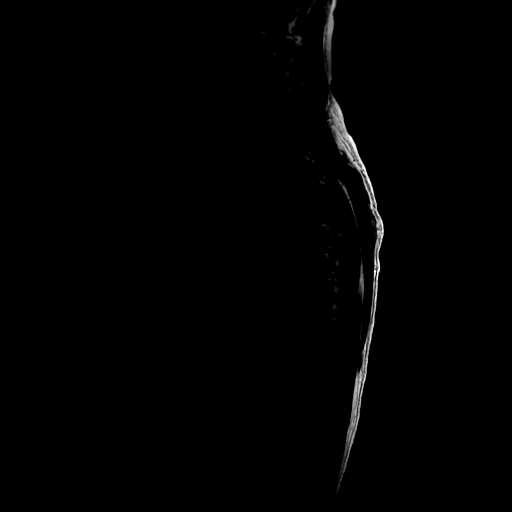

[Series 4: T2 · sagittal · 3.0mm · 0.66mm/px · 2 of 15 slices shown (1 of 4)]
[im 1/15]
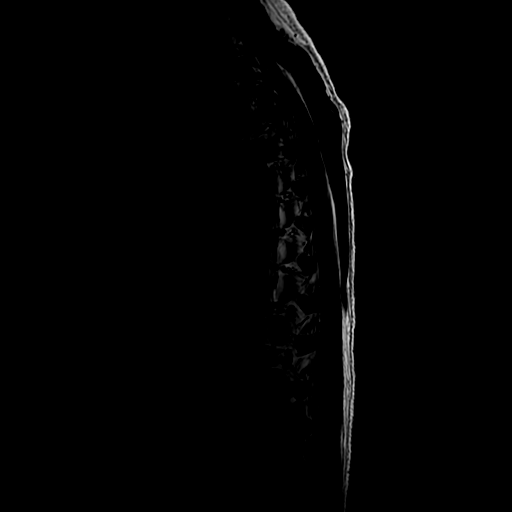
[im 15/15]
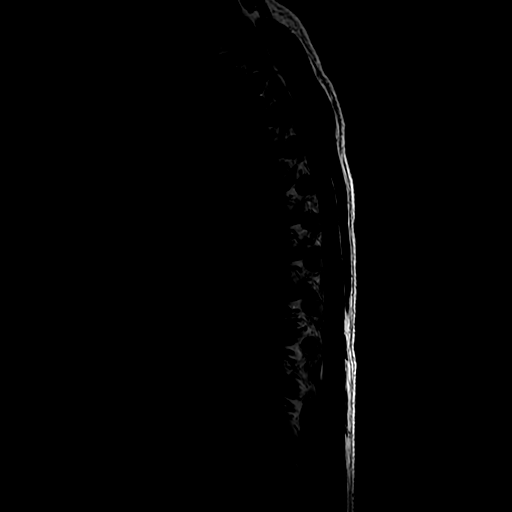

[Series 6: T1 · sagittal · 3.0mm · 0.66mm/px · 2 of 15 slices shown (2 of 3)]
[im 1/15]
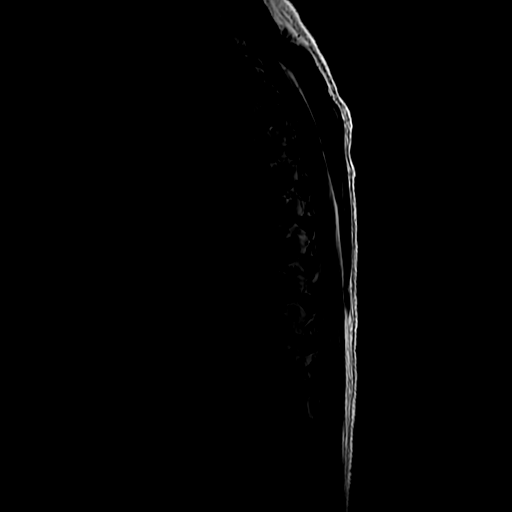
[im 15/15]
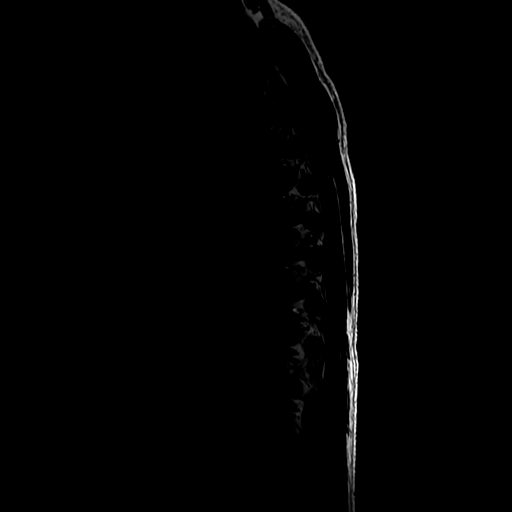

[Series 7: T2 · axial · 4.0mm · 0.39mm/px · z∈[-213,+28]mm · 4 of 39 slices shown (2 of 4)]
[im 1/39]
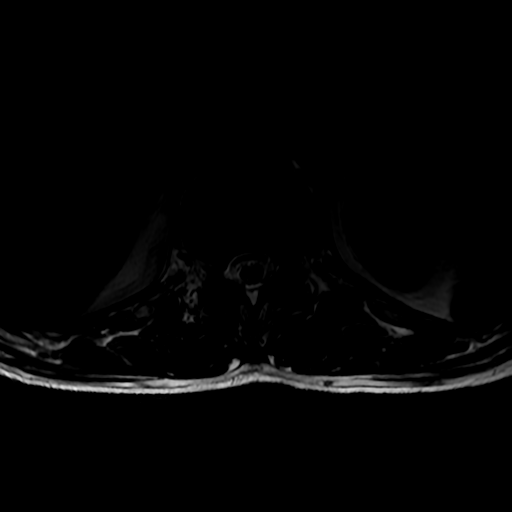
[im 13/39]
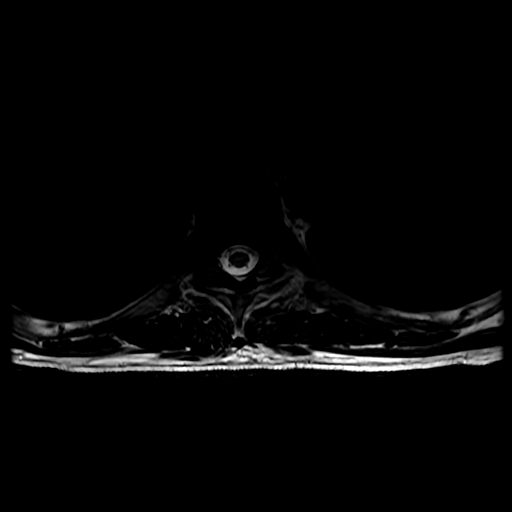
[im 26/39]
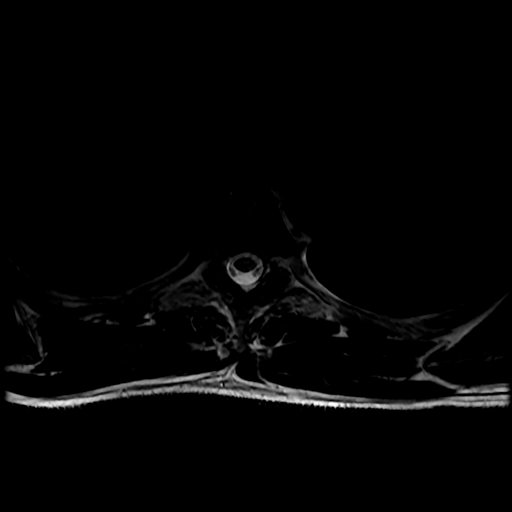
[im 39/39]
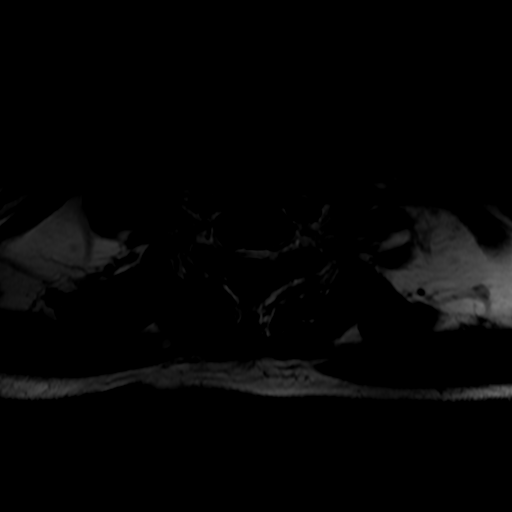

[Series 9: T1 · axial · non-contrast · 4.0mm · 0.39mm/px · z∈[-213,-104]mm · 2 of 39 slices shown (3 of 3)]
[im 1/39]
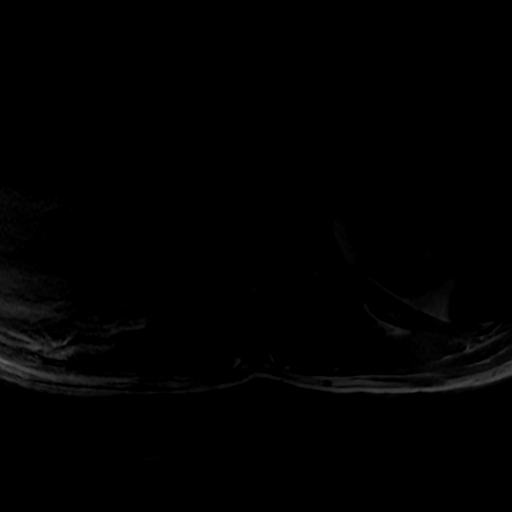
[im 13/39]
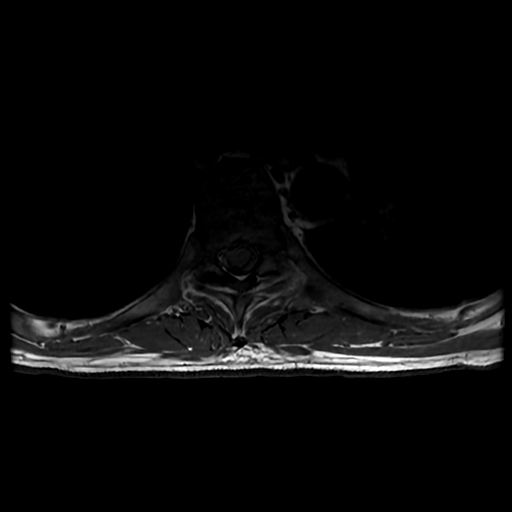

[Series 11: T2 · sagittal · 4.0mm · 0.55mm/px · 2 of 15 slices shown (3 of 4)]
[im 1/15]
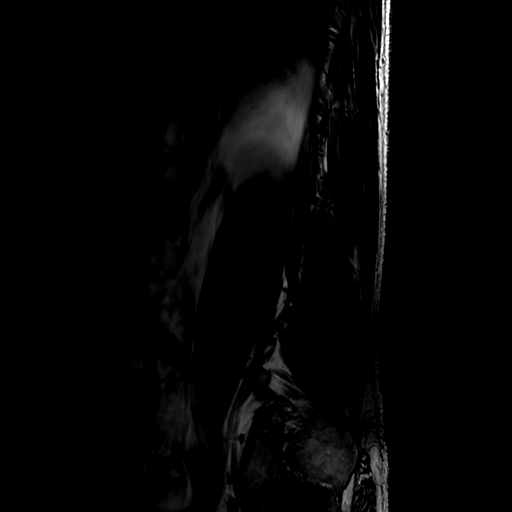
[im 15/15]
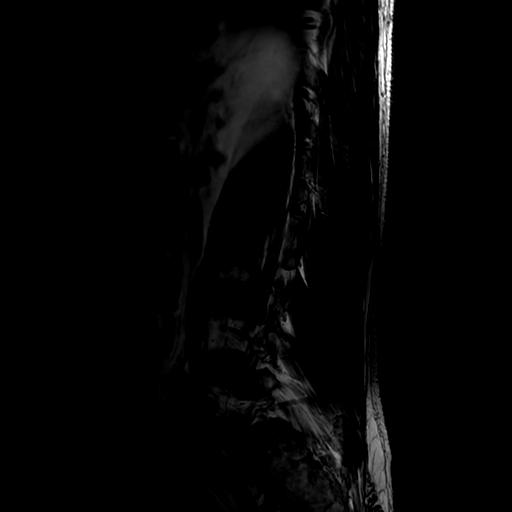

[Series 14: T2 · axial · 4.0mm · 0.39mm/px · z∈[-432,-197]mm · 5 of 43 slices shown (4 of 4)]
[im 1/43]
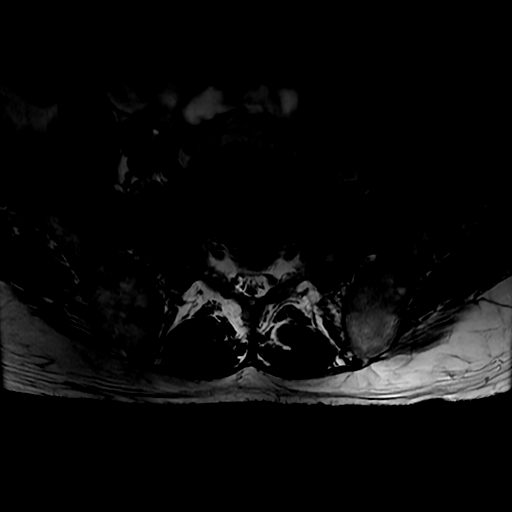
[im 11/43]
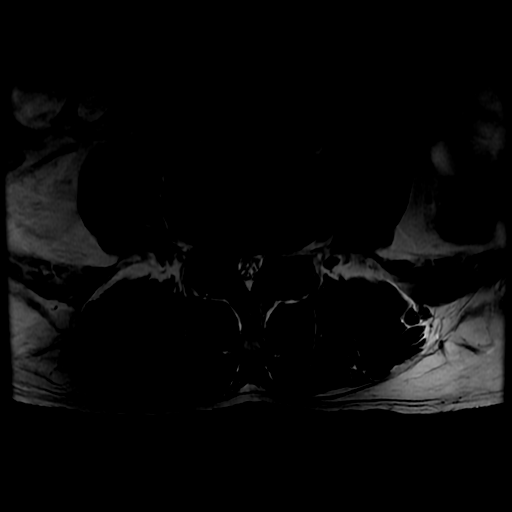
[im 22/43]
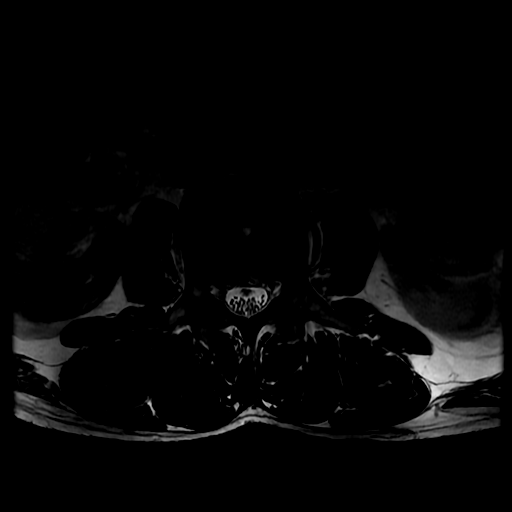
[im 32/43]
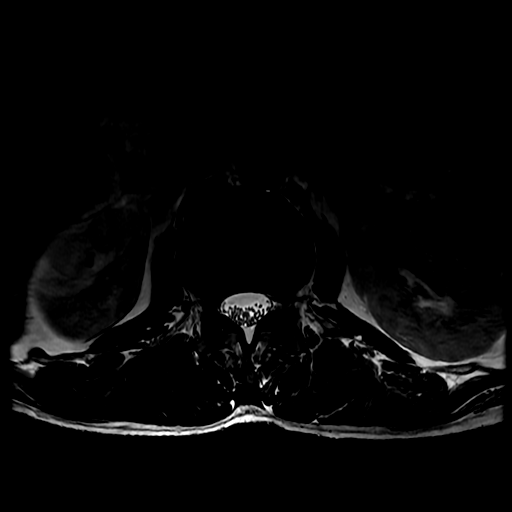
[im 43/43]
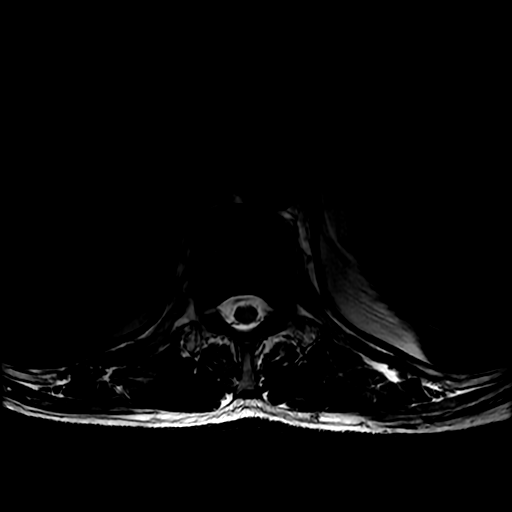

[18 of 48 positions shown; findings below may reference images not displayed]

FINDINGS: MRI THORACIC SPINE FINDINGS

Alignment:  Normal

Vertebrae: Normal bone marrow. Negative for fracture or mass. No
evidence of spinal infection. Normal enhancement of the spine.

Cord:  Normal signal and morphology.  No cord compression.

Paraspinal and other soft tissues: Negative for paraspinous mass,
adenopathy, or fluid collection.

Disc levels:

Small right-sided disc protrusion T2-3 without significant stenosis.
Remaining disc spaces appear healthy. No significant stenosis
elsewhere.

MRI LUMBAR SPINE FINDINGS

Segmentation:  Normal

Alignment:  Normal

Vertebrae: Normal bone marrow. Negative for fracture, mass, or
spinal infection.

Conus medullaris: Extends to the L1-2 level and appears normal.

Paraspinal and other soft tissues: Negative for paraspinous soft
tissue mass, adenopathy, or fluid collection.

Disc levels:

L1-2: Negative

L2-3: Negative

L3-4: Mild disc and mild facet degeneration. Disc bulging and facet
hypertrophy. Subligamentous 2 mm synovial cyst on the left. Mild to
moderate spinal stenosis. Moderate subarticular stenosis
bilaterally.

L4-5: Disc degeneration with disc space narrowing. Broad-based
shallow disc protrusion with associated spurring. Bilateral facet
hypertrophy. Mild to moderate spinal stenosis. Moderate subarticular
stenosis bilaterally

L5-S1: Mild facet degeneration. Mild subarticular stenosis
bilaterally.
IMPRESSION: 1. Small right-sided disc protrusion at T2-3. Otherwise negative
thoracic MRI
2. Disc and facet degeneration causing mild to moderate spinal
stenosis L3-4 and L4-5. No change from the prior MRI [DATE].

## 2021-02-21 IMAGING — CT CT ANGIO CHEST
2 of 6 series · 18 of 46 positions shown · IV contrast (APPLIED)
Comparison: None.

CLINICAL DATA: Shortness of breath, evaluate for pulmonary embolism

EXAM:
CT ANGIOGRAPHY CHEST WITH CONTRAST
TECHNIQUE: Multidetector CT imaging of the chest was performed using the
standard protocol during bolus administration of intravenous
contrast. Multiplanar CT image reconstructions and MIPs were
obtained to evaluate the vascular anatomy.
CONTRAST:  40mL OMNIPAQUE IOHEXOL 350 MG/ML SOLN

[Series 7: thins · axial · 0.72mm/px · z∈[+1068,+1376]mm · 15 of 483 slices shown]
[im 21/483  lung]
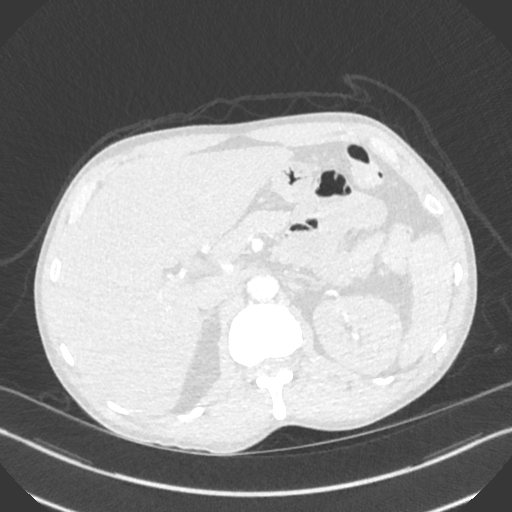
[im 63/483  soft-tissue]
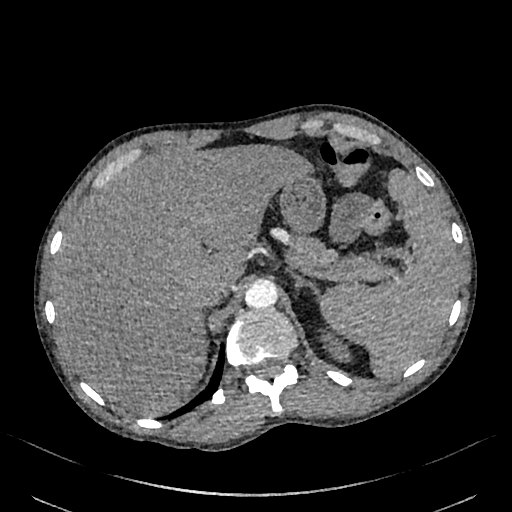
[im 84/483  lung]
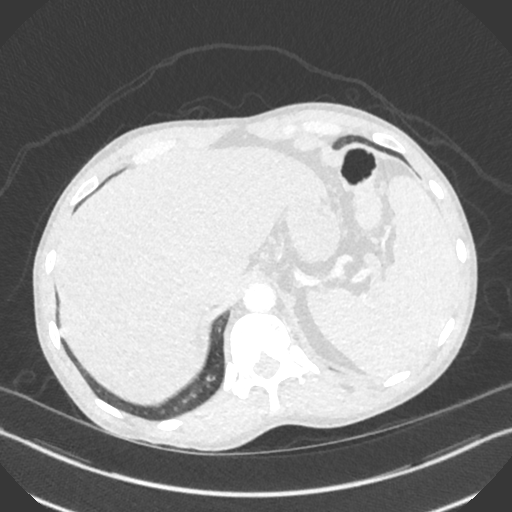
[im 126/483  soft-tissue]
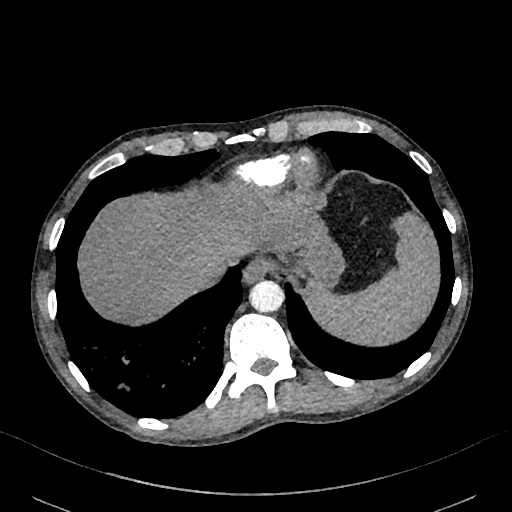
[im 147/483  lung]
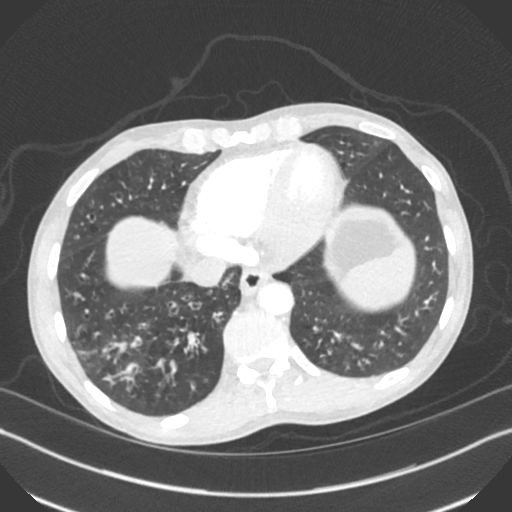
[im 189/483  soft-tissue]
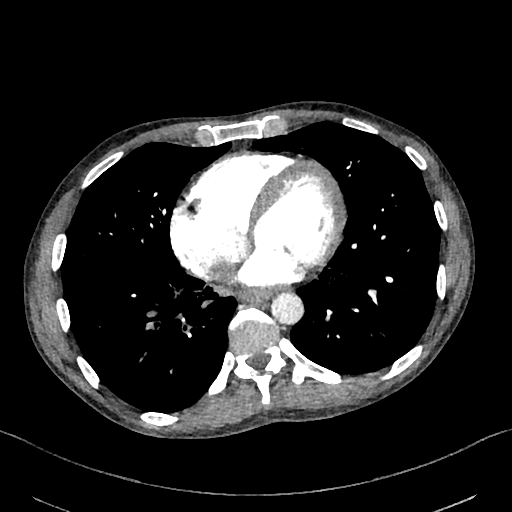
[im 210/483  lung]
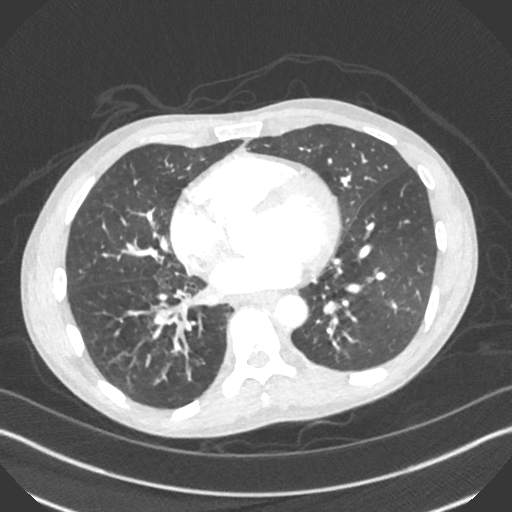
[im 252/483  soft-tissue]
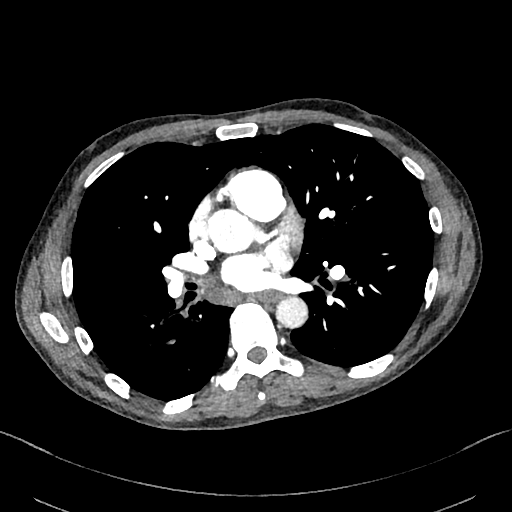
[im 273/483  lung]
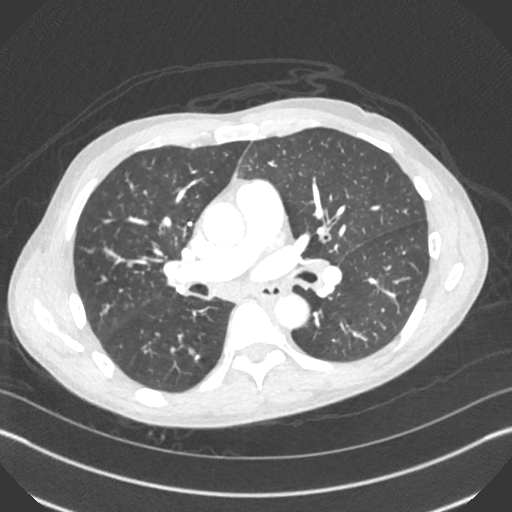
[im 294/483  soft-tissue]
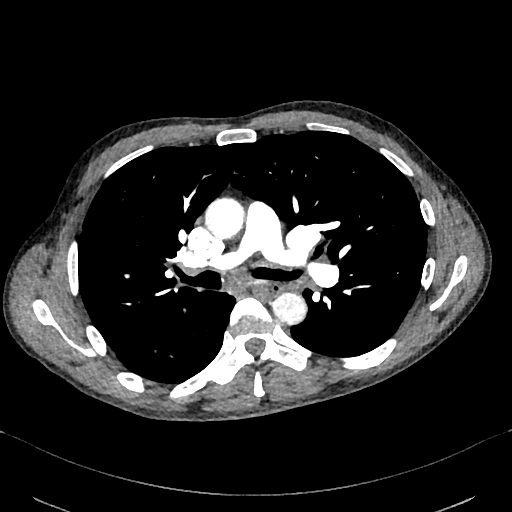
[im 336/483  lung]
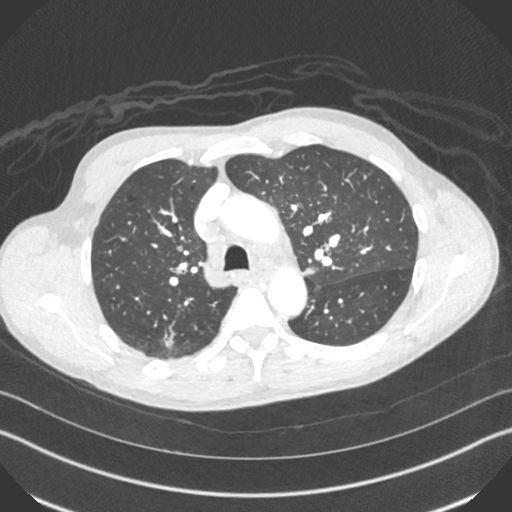
[im 357/483  soft-tissue]
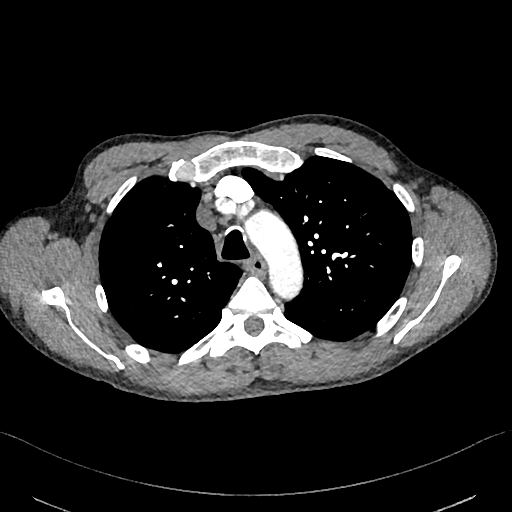
[im 399/483  lung]
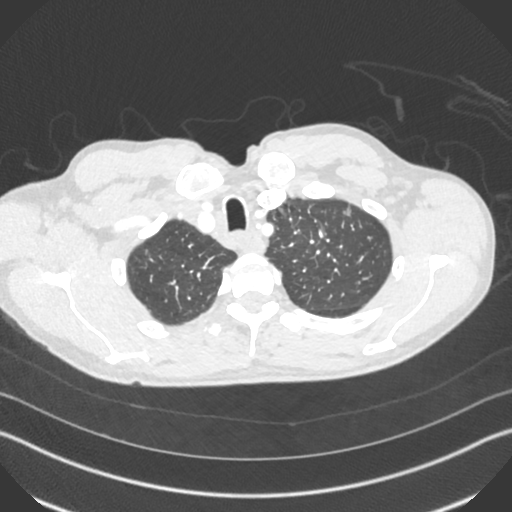
[im 420/483  soft-tissue]
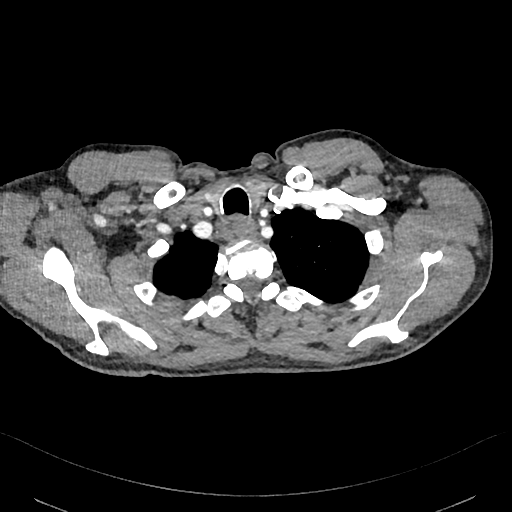
[im 462/483  lung]
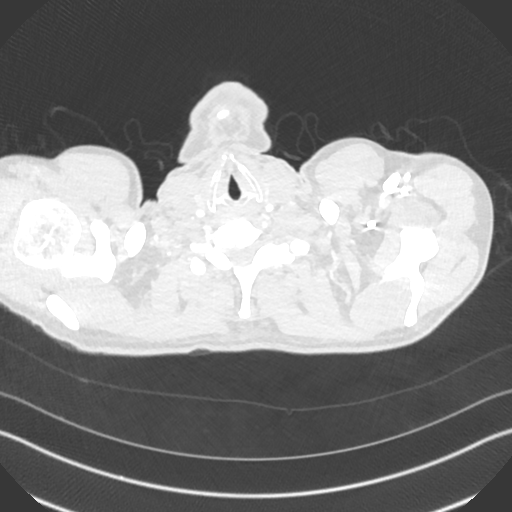

[Series 8: cor · coronal · 0.67mm/px · 3 of 132 slices shown]
[im 33/132  soft-tissue]
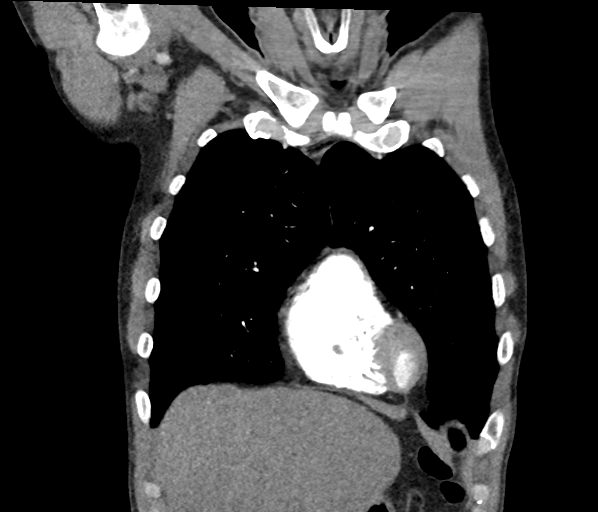
[im 66/132  soft-tissue]
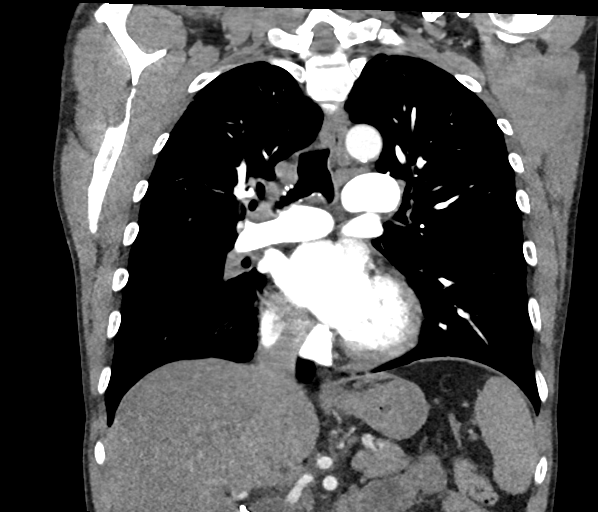
[im 99/132  soft-tissue]
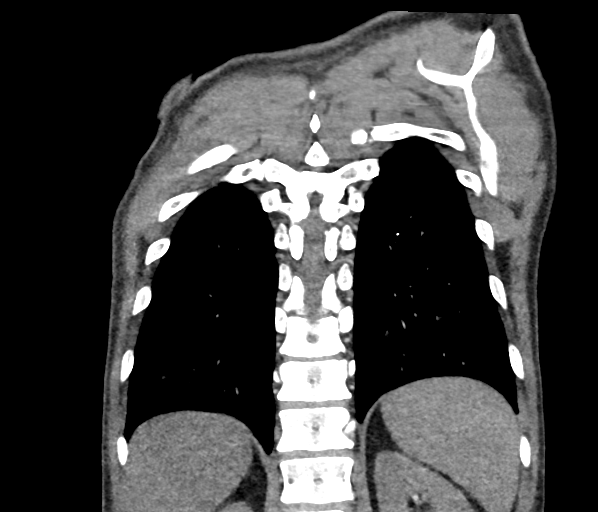

[18 of 46 positions shown; findings below may reference images not displayed]

FINDINGS: Cardiovascular: Normal heart size. No pericardial effusion.
Pulmonary artery filling defect within a segmental branch of the
right lower lobe. Although right lower lobe pulmonary arteries are
narrow and hypoenhancing relative to other vessels (likely due to
chronic lung disease and shunting) filling defect appears convincing
with discrete and tubular shape.

Mediastinum/Nodes: Enlarged paratracheal, subcarinal, and bilateral
hilar lymph nodes with a few coarse calcifications. Left
paratracheal nodal tissue measures up to 11 mm in short axis and
subcarinal node is 16 mm in short axis.

Lungs/Pleura: Airway thickening with reticulonodular opacity in the
right lower lobe. Cylindrical bronchiectasis in right middle and
lower lobes. Thin walled irregular shape cyst in the superior
segment right lower lobe measuring 18 mm, crossing the diaphragm
where the wall is thicker and more irregular and measuring up to 22
mm in the poste.

Rior right upper lobe.

Upper Abdomen: Cholecystectomy.

Musculoskeletal: No acute finding

Review of the MIP images confirms the above findings.

These results were called by telephone at the time of interpretation
on [DATE] at [DATE] to provider SANDLER , who verbally
acknowledged these results.
IMPRESSION: 1. Positive for segmental pulmonary embolism to the right lower
lobe.
2. Bronchopneumonia in the right lower lobe. There is a background
of generalized bronchitis and right middle/lower lobe cylindrical
bronchiectasis
3. Right pulmonary irregular cyst spanning the major fissure, likely
stable from [C2] as permitted by extensive motion artifact on prior.
4. Mild mediastinal adenopathy which may be related to #2. There is
also superimposed calcification suggesting chronic granulomatous
disease.

## 2021-02-21 IMAGING — US US ABDOMEN LIMITED
1 series · 14 of 25 positions shown · non-contrast
Comparison: None.

CLINICAL DATA: Transaminitis, history of cholecystectomy

EXAM:
ULTRASOUND ABDOMEN LIMITED RIGHT UPPER QUADRANT

[Series 1: us abdomen limited ruq (liver/gb) · 14 of 33 slices shown]
[im 1/33]
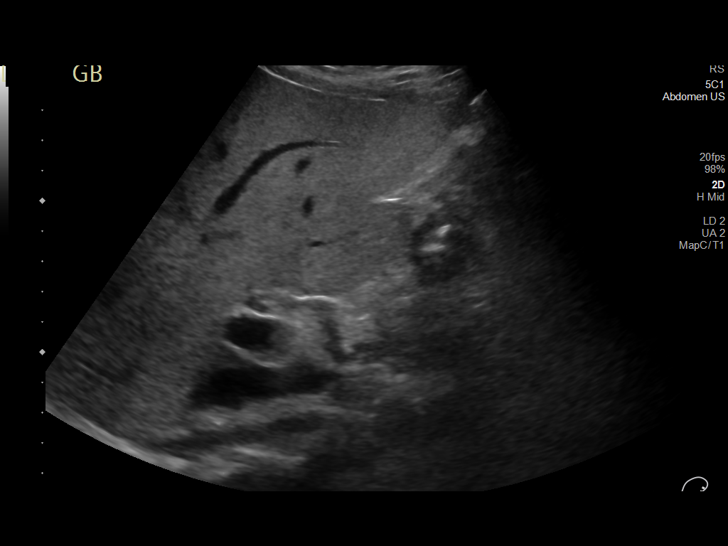
[im 3/33]
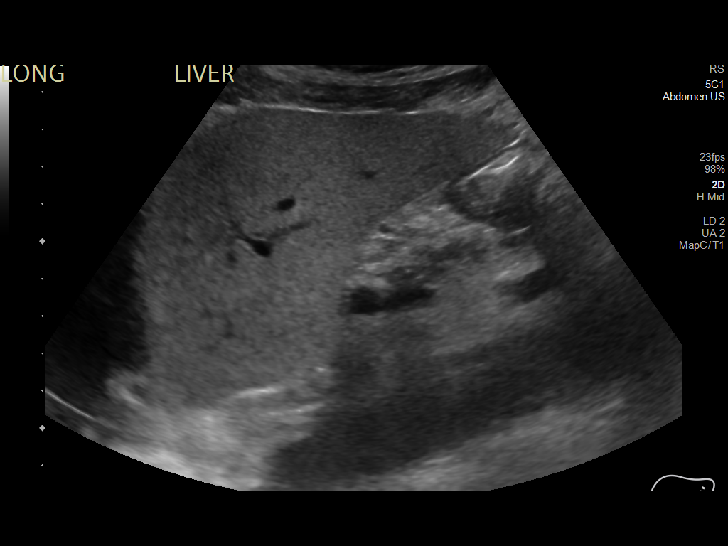
[im 6/33]
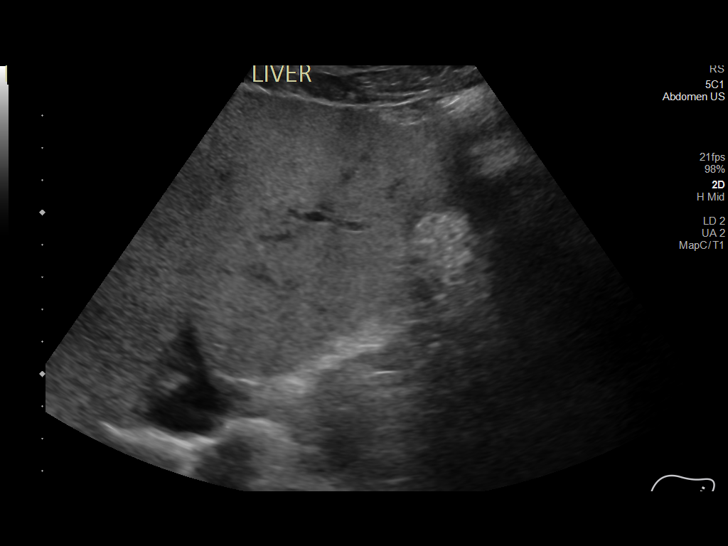
[im 9/33]
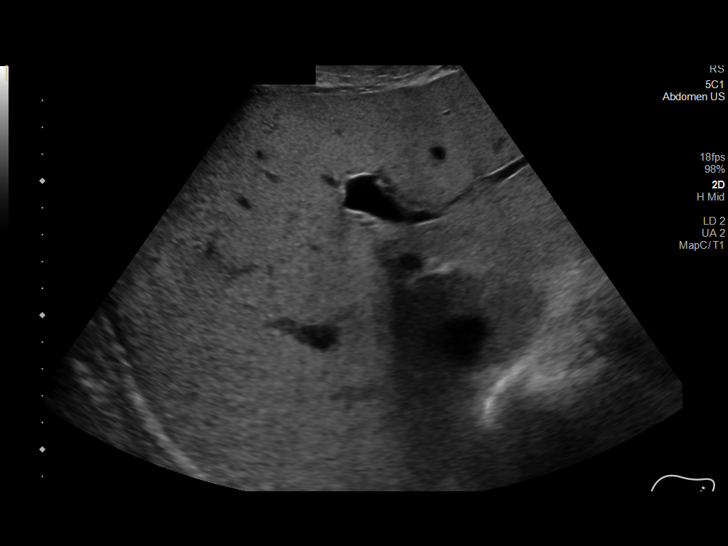
[im 11/33]
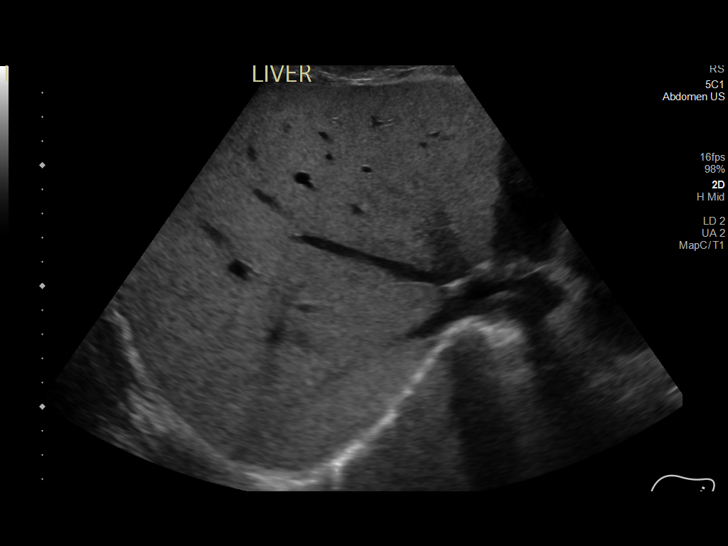
[im 13/33]
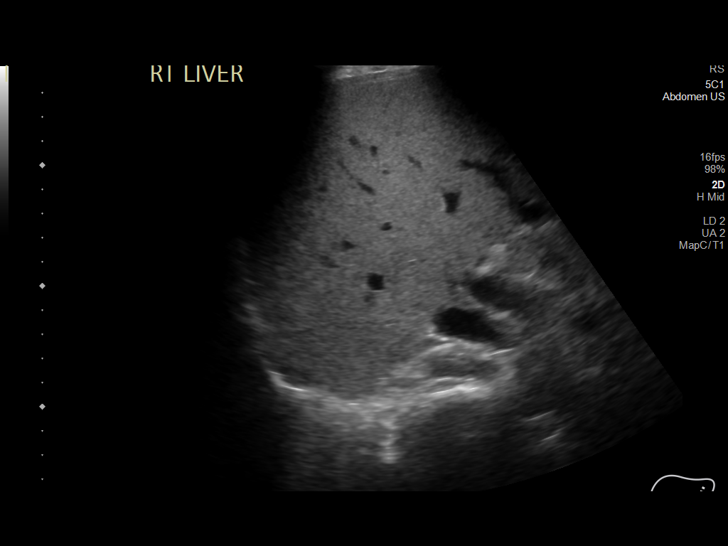
[im 15/33]
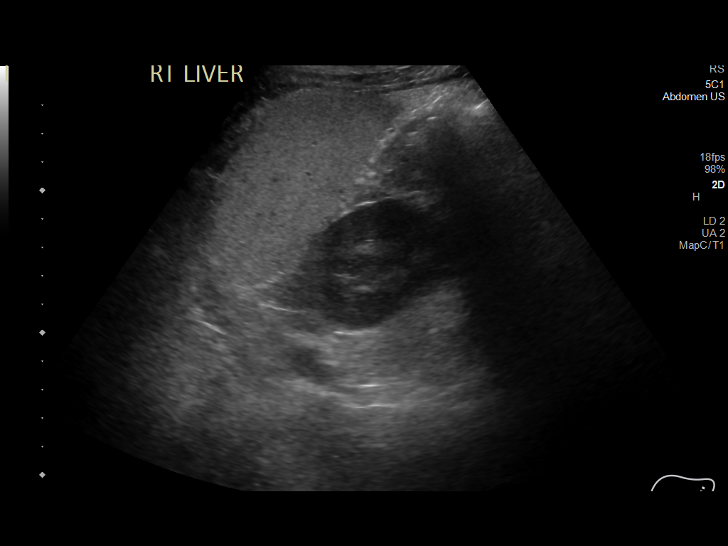
[im 18/33]
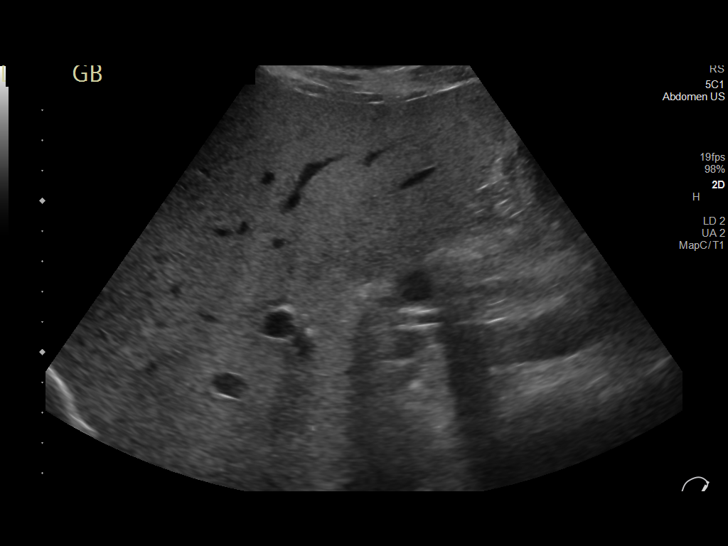
[im 21/33]
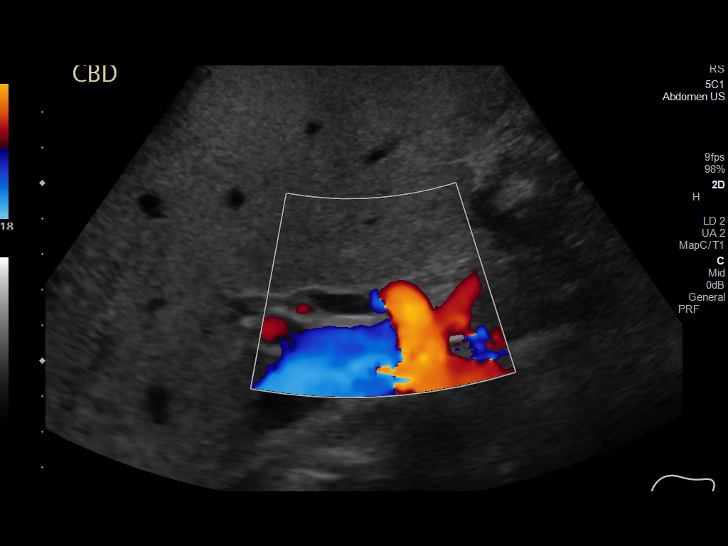
[im 22/33]
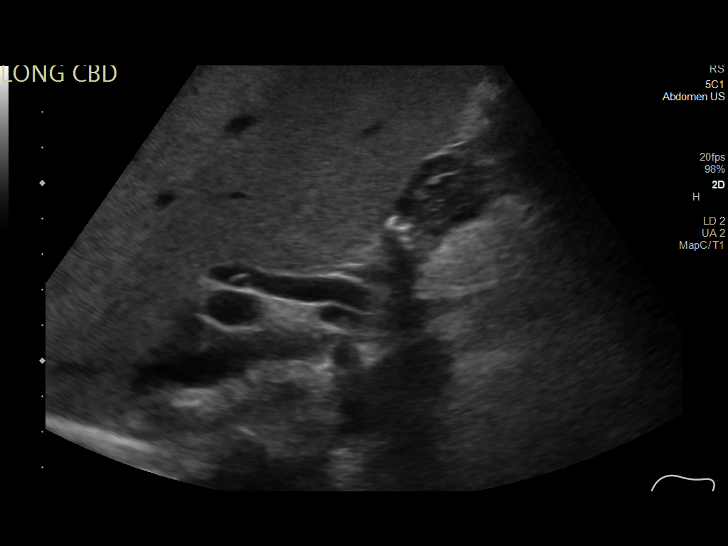
[im 25/33]
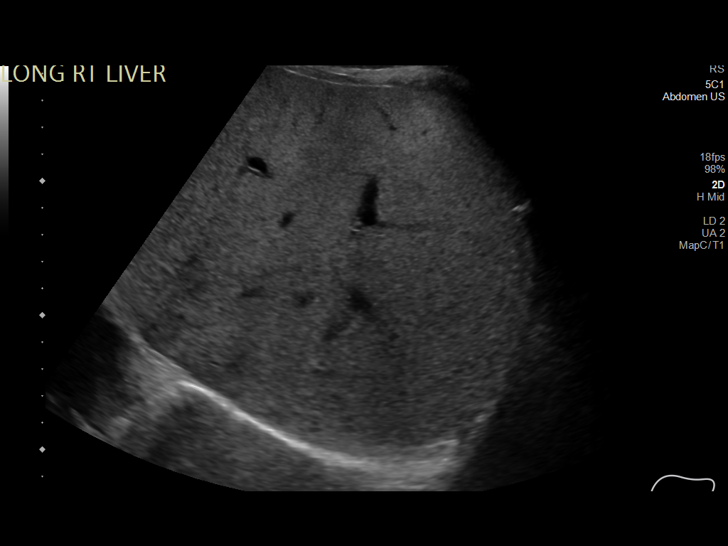
[im 27/33]
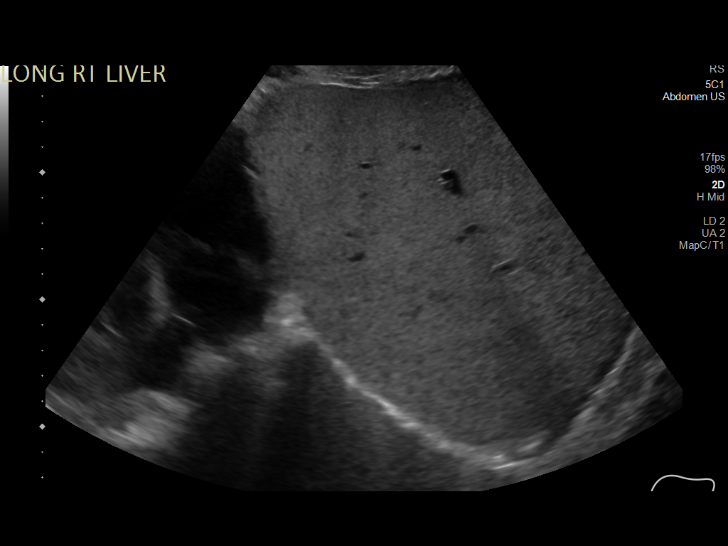
[im 30/33]
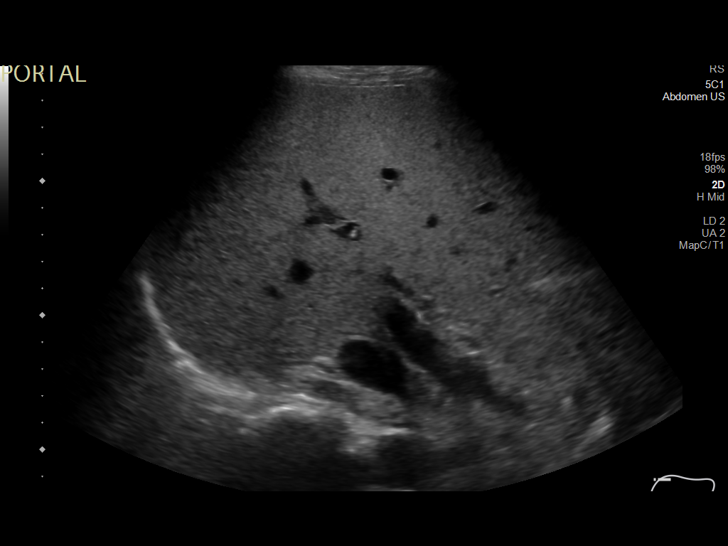
[im 33/33]
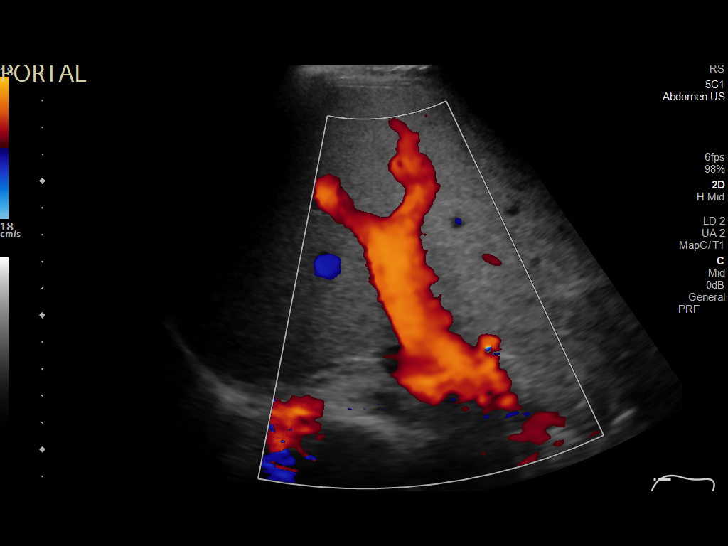

[14 of 25 positions shown; findings below may reference images not displayed]

FINDINGS: Gallbladder:

Surgically absent

Common bile duct:

Diameter: 7 mm, at the upper limits of normal but likely due to the
history of cholecystectomy.

Liver:

Parenchymal echogenicity is diffusely increased. There are no focal
lesions. Portal vein is patent on color Doppler imaging with normal
direction of blood flow towards the liver.

Other: None.
IMPRESSION: Increased parenchymal echogenicity of the liver suggesting moderate
hepatic steatosis. No focal lesions or intrahepatic biliary ductal
dilatation.

## 2021-02-21 IMAGING — CR DG CHEST 2V
2 series · 2 of 2 positions shown · non-contrast
Comparison: [DATE]

CLINICAL DATA: Cough, low back pain.  HIV aids.

EXAM:
CHEST - 2 VIEW

[chest pa]
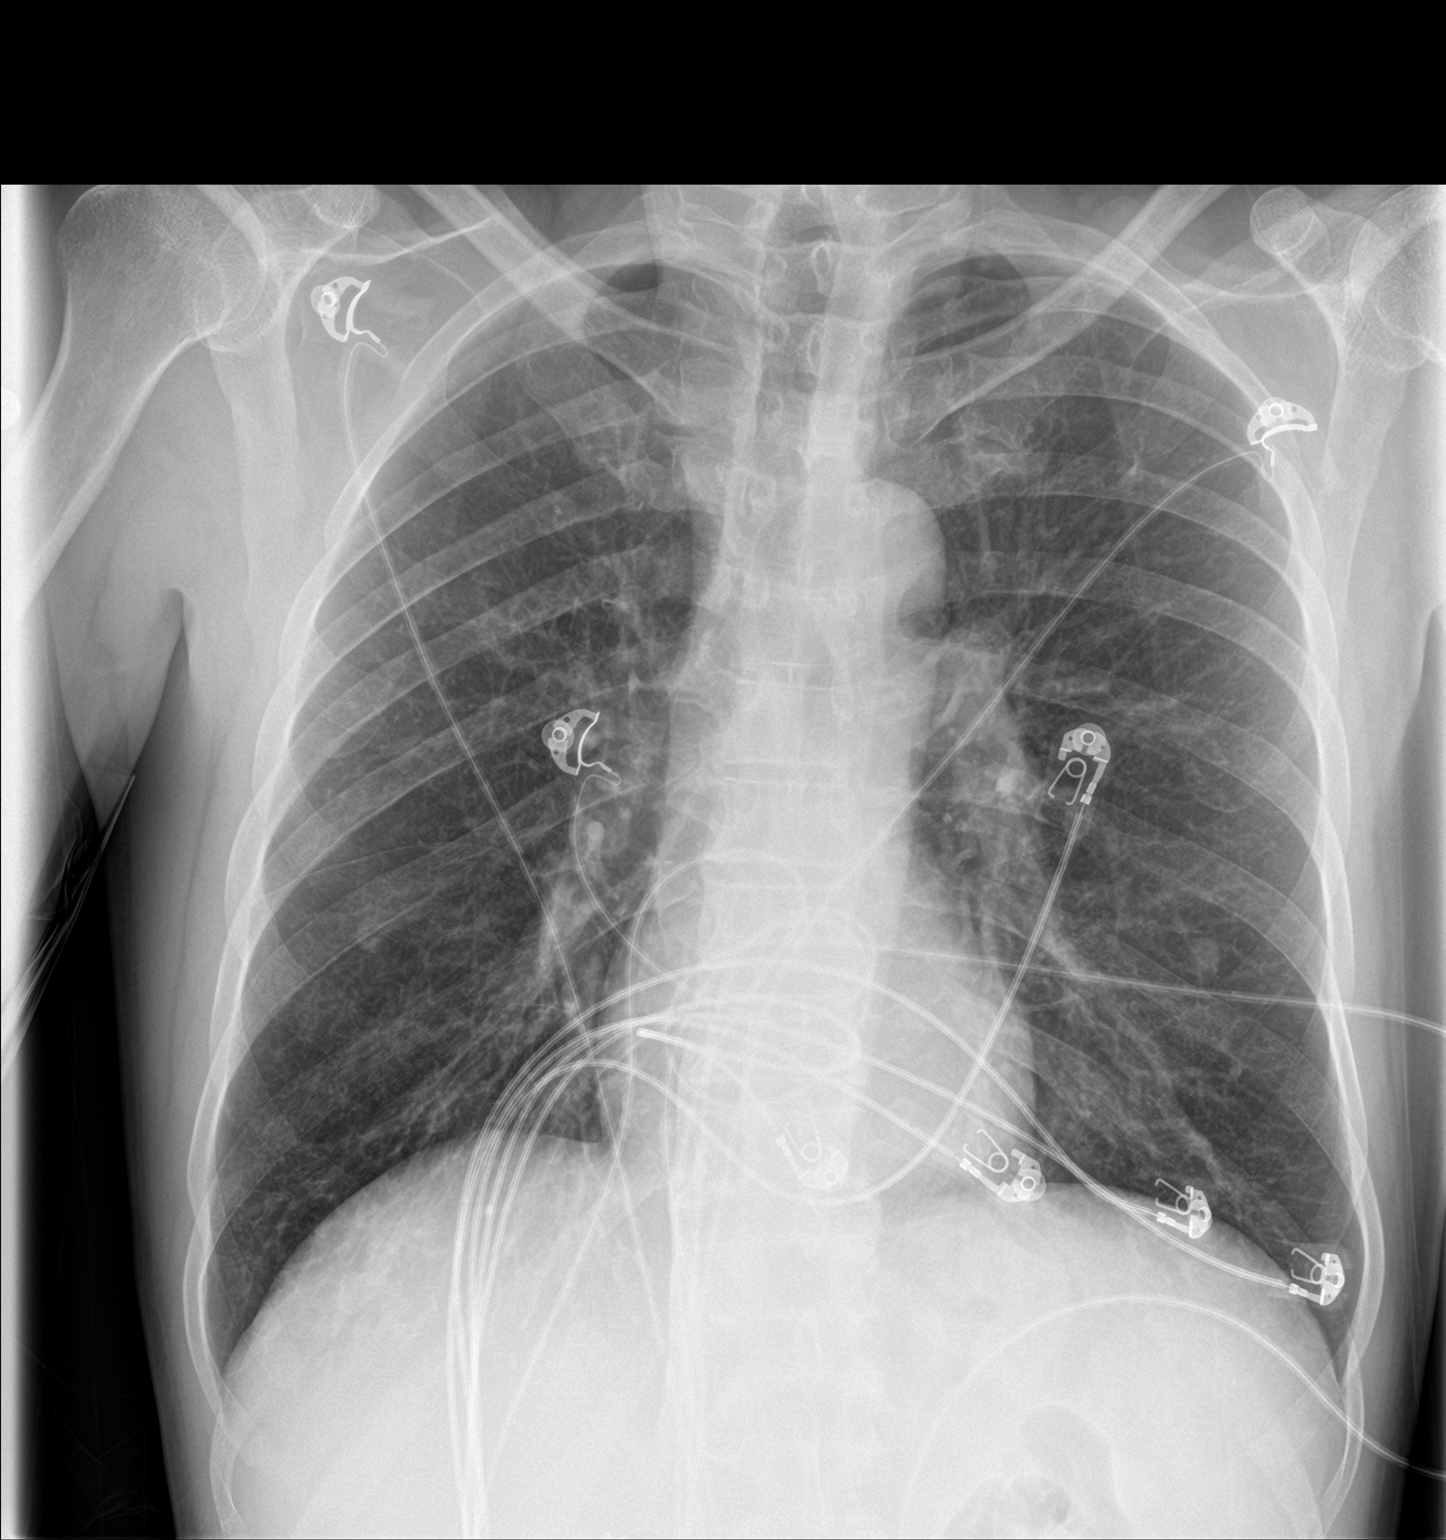

[chest lat]
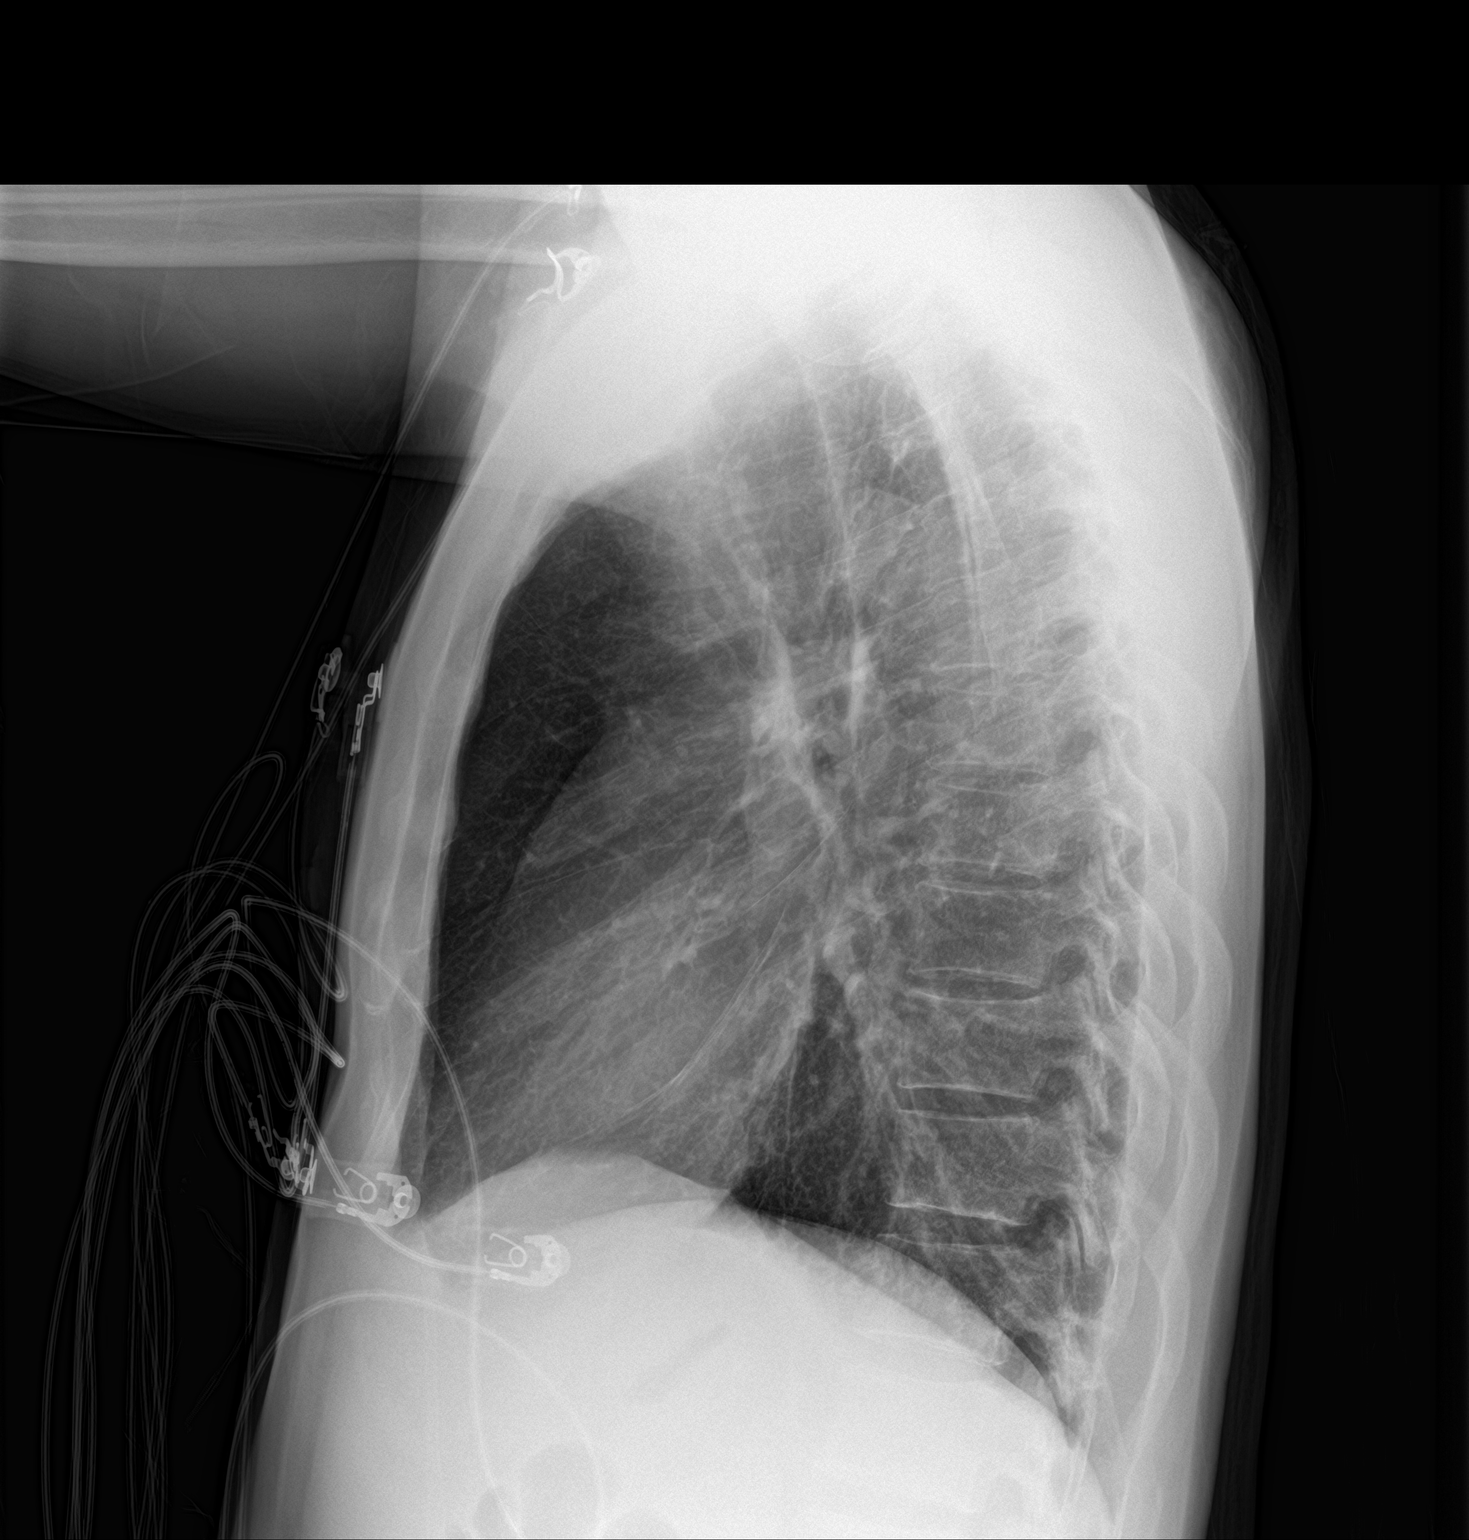

[2 of 2 positions shown; findings below may reference images not displayed]

FINDINGS: The heart size and mediastinal contours are within normal limits.
Both lungs are clear. The visualized skeletal structures are
unremarkable. Interval clearing of infiltrate in the right lung
base.
IMPRESSION: No active cardiopulmonary disease.

## 2021-02-21 MED ORDER — SODIUM CHLORIDE 0.9 % IV SOLN
2.0000 g | Freq: Once | INTRAVENOUS | Status: AC
  Administered 2021-02-21: 2 g via INTRAVENOUS
  Filled 2021-02-21: qty 2

## 2021-02-21 MED ORDER — FENTANYL CITRATE PF 50 MCG/ML IJ SOSY
50.0000 ug | PREFILLED_SYRINGE | Freq: Once | INTRAMUSCULAR | Status: AC
  Administered 2021-02-21: 50 ug via INTRAVENOUS
  Filled 2021-02-21: qty 1

## 2021-02-21 MED ORDER — CYCLOBENZAPRINE HCL 10 MG PO TABS
5.0000 mg | ORAL_TABLET | Freq: Three times a day (TID) | ORAL | Status: DC | PRN
  Administered 2021-02-22: 5 mg via ORAL
  Filled 2021-02-21: qty 1

## 2021-02-21 MED ORDER — BUTALBITAL-APAP-CAFFEINE 50-325-40 MG PO TABS
1.0000 | ORAL_TABLET | Freq: Two times a day (BID) | ORAL | Status: DC | PRN
  Administered 2021-02-21 – 2021-02-23 (×2): 1 via ORAL
  Filled 2021-02-21 (×2): qty 1

## 2021-02-21 MED ORDER — SODIUM CHLORIDE 0.9 % IV SOLN
500.0000 mg | INTRAVENOUS | Status: DC
  Administered 2021-02-21 – 2021-02-22 (×2): 500 mg via INTRAVENOUS
  Filled 2021-02-21 (×2): qty 500

## 2021-02-21 MED ORDER — HYDROMORPHONE HCL 1 MG/ML IJ SOLN
0.5000 mg | INTRAMUSCULAR | Status: DC | PRN
  Administered 2021-02-21 (×2): 1 mg via INTRAVENOUS
  Filled 2021-02-21 (×3): qty 1

## 2021-02-21 MED ORDER — HEPARIN (PORCINE) 25000 UT/250ML-% IV SOLN
1700.0000 [IU]/h | INTRAVENOUS | Status: DC
  Administered 2021-02-21: 1300 [IU]/h via INTRAVENOUS
  Filled 2021-02-21 (×2): qty 250

## 2021-02-21 MED ORDER — SODIUM CHLORIDE 0.9% FLUSH
3.0000 mL | Freq: Two times a day (BID) | INTRAVENOUS | Status: DC
  Administered 2021-02-21 – 2021-02-26 (×11): 3 mL via INTRAVENOUS

## 2021-02-21 MED ORDER — SULFAMETHOXAZOLE-TRIMETHOPRIM 400-80 MG PO TABS
1.0000 | ORAL_TABLET | Freq: Every day | ORAL | Status: DC
  Administered 2021-02-21 – 2021-02-22 (×2): 1 via ORAL
  Filled 2021-02-21 (×3): qty 1

## 2021-02-21 MED ORDER — ACETAMINOPHEN 325 MG PO TABS
650.0000 mg | ORAL_TABLET | Freq: Four times a day (QID) | ORAL | Status: DC | PRN
  Administered 2021-02-21 – 2021-02-26 (×9): 650 mg via ORAL
  Filled 2021-02-21 (×9): qty 2

## 2021-02-21 MED ORDER — FOLIC ACID 1 MG PO TABS
1.0000 mg | ORAL_TABLET | Freq: Every day | ORAL | Status: DC
  Administered 2021-02-21 – 2021-02-26 (×6): 1 mg via ORAL
  Filled 2021-02-21 (×6): qty 1

## 2021-02-21 MED ORDER — THIAMINE HCL 100 MG PO TABS
100.0000 mg | ORAL_TABLET | Freq: Every day | ORAL | Status: DC
  Administered 2021-02-21 – 2021-02-26 (×6): 100 mg via ORAL
  Filled 2021-02-21 (×6): qty 1

## 2021-02-21 MED ORDER — HYDROMORPHONE HCL 1 MG/ML IJ SOLN
0.5000 mg | Freq: Four times a day (QID) | INTRAMUSCULAR | Status: DC | PRN
  Administered 2021-02-22 – 2021-02-23 (×2): 0.5 mg via INTRAVENOUS
  Filled 2021-02-21 (×2): qty 1

## 2021-02-21 MED ORDER — SODIUM CHLORIDE 0.9 % IV BOLUS
1000.0000 mL | Freq: Once | INTRAVENOUS | Status: AC
  Administered 2021-02-21: 1000 mL via INTRAVENOUS

## 2021-02-21 MED ORDER — BENZONATATE 100 MG PO CAPS
100.0000 mg | ORAL_CAPSULE | Freq: Three times a day (TID) | ORAL | Status: DC | PRN
  Administered 2021-02-21: 100 mg via ORAL
  Filled 2021-02-21: qty 1

## 2021-02-21 MED ORDER — GADOBUTROL 1 MMOL/ML IV SOLN
7.5000 mL | Freq: Once | INTRAVENOUS | Status: AC | PRN
  Administered 2021-02-21: 7.5 mL via INTRAVENOUS

## 2021-02-21 MED ORDER — VANCOMYCIN HCL 1500 MG/300ML IV SOLN
1500.0000 mg | Freq: Once | INTRAVENOUS | Status: AC
  Administered 2021-02-21: 1500 mg via INTRAVENOUS
  Filled 2021-02-21: qty 300

## 2021-02-21 MED ORDER — KETOROLAC TROMETHAMINE 15 MG/ML IJ SOLN
15.0000 mg | Freq: Once | INTRAMUSCULAR | Status: AC
  Administered 2021-02-21: 15 mg via INTRAVENOUS
  Filled 2021-02-21: qty 1

## 2021-02-21 MED ORDER — ONDANSETRON HCL 4 MG/2ML IJ SOLN
4.0000 mg | Freq: Once | INTRAMUSCULAR | Status: AC
  Administered 2021-02-21: 4 mg via INTRAVENOUS
  Filled 2021-02-21: qty 2

## 2021-02-21 MED ORDER — ACETAMINOPHEN 650 MG RE SUPP: 650.0000 mg | Freq: Four times a day (QID) | RECTAL | Status: DC | PRN

## 2021-02-21 MED ORDER — SODIUM CHLORIDE 0.9 % IV SOLN
2.0000 g | Freq: Three times a day (TID) | INTRAVENOUS | Status: DC
  Administered 2021-02-21 – 2021-02-22 (×3): 2 g via INTRAVENOUS
  Filled 2021-02-21 (×3): qty 2

## 2021-02-21 MED ORDER — ADULT MULTIVITAMIN W/MINERALS CH
1.0000 | ORAL_TABLET | Freq: Every day | ORAL | Status: DC
  Administered 2021-02-21 – 2021-02-26 (×6): 1 via ORAL
  Filled 2021-02-21 (×6): qty 1

## 2021-02-21 MED ORDER — LACTATED RINGERS IV BOLUS
1000.0000 mL | Freq: Once | INTRAVENOUS | Status: AC
  Administered 2021-02-21: 1000 mL via INTRAVENOUS

## 2021-02-21 MED ORDER — IOHEXOL 350 MG/ML SOLN
40.0000 mL | Freq: Once | INTRAVENOUS | Status: AC | PRN
  Administered 2021-02-21: 40 mL via INTRAVENOUS

## 2021-02-21 MED ORDER — VANCOMYCIN HCL IN DEXTROSE 1-5 GM/200ML-% IV SOLN
1000.0000 mg | Freq: Two times a day (BID) | INTRAVENOUS | Status: DC
  Administered 2021-02-21: 1000 mg via INTRAVENOUS
  Filled 2021-02-21 (×4): qty 200

## 2021-02-21 MED ORDER — HEPARIN BOLUS VIA INFUSION
6000.0000 [IU] | Freq: Once | INTRAVENOUS | Status: AC
  Administered 2021-02-21: 6000 [IU] via INTRAVENOUS
  Filled 2021-02-21: qty 6000

## 2021-02-21 MED ORDER — DARUN-COBIC-EMTRICIT-TENOFAF 800-150-200-10 MG PO TABS
1.0000 | ORAL_TABLET | Freq: Every day | ORAL | Status: DC
  Administered 2021-02-23 – 2021-02-25 (×3): 1 via ORAL
  Filled 2021-02-21 (×5): qty 1

## 2021-02-21 NOTE — Progress Notes (Signed)
Pharmacy Antibiotic Note  [facility] Jon Love is a 45 y.o. male admitted on 02/21/2021 with sepsis. Pharmacy has been consulted for vancomycin and cefepime dosing. Pt is afebrile and WBC is 10.8. SCr is 1.16 and lactic acid is elevated at 3.   Plan: Vanc 1500mg  IV x 1 then 1g IV Q12H  Cefepime 2gm IV Q8H  F/u renal fxn, C&S, clinical status and peak/trough at SS  Height: 5\' 10"  (177.8 cm) Weight: 77.1 kg (170 lb) IBW/kg (Calculated) : 73  Temp (24hrs), Avg:98.5 F (36.9 C), Min:98.1 F (36.7 C), Max:98.8 F (37.1 C)  Recent Labs  Lab 02/21/21 0618  WBC 10.8*  CREATININE 1.16  LATICACIDVEN 3.0*    Estimated Creatinine Clearance: 83 mL/min (by C-G formula based on SCr of 1.16 mg/dL).    Allergies  Allergen Reactions   Penicillins Hives and Itching    Has patient had a PCN reaction causing immediate rash, facial/tongue/throat swelling, SOB or lightheadedness with hypotension: Yes Has patient had a PCN reaction causing severe rash involving mucus membranes or skin necrosis: No Has patient had a PCN reaction that required hospitalization No Has patient had a PCN reaction occurring within the last 10 years: No If all of the above answers are "NO", then may proceed with Cephalosporin use.    Antimicrobials this admission: Vanc 9/15>> Cefepime 9/15>>  Dose adjustments this admission: N/A  Microbiology results: Pending  Thank you for allowing pharmacy to be a part of this patient's care.  Marcene Laskowski, 02/21/2021 9:05 AM

## 2021-02-21 NOTE — ED Notes (Addendum)
Patient transported to MRI 

## 2021-02-21 NOTE — ED Notes (Signed)
L FA IV slightly edematous, pt reported pain. RN d/c'd line and moved heparin gtt to R AC.

## 2021-02-21 NOTE — H&P (Addendum)
Date: 02/21/2021               Patient Name:  Jon Love MRN: [id number]  DOB: 23-Dec-1975 Age / Sex: 45 y.o., male   PCP: Patient, No Pcp Per (Inactive)         Medical Service: Internal Medicine Teaching Service         Attending Physician: Dr. Velna Ochs, MD    First Contact: Wayland Denis, MD Pager: [phone]  Second Contact: Rick Duff, MD Pager: [phone]       After Hours (After 5p/  First Contact Pager: [phone]  weekends / holidays): Second Contact Pager: [phone]   SUBJECTIVE  Chief Complaint: back pain  History of Present Illness: Jon Love is a 45 y.o. male with a pertinent PMH of HIV/AIDS (CD<35, viral load 37,900) who presents to [facility] with back pain. Spanish interpreter used during interview.  Mr Ned Kakar notes several weeks of productive cough, subjective fevers and chills and night sweats. He also endorses some weight loss but is unable to clarify further. He reports that he was previously out of his HIV medications but has been compliant with the regimen for the past two weeks. He is also endorsing two days of nausea and vomiting - on Monday and Tuesday with one episode of emesis on each day. He reports persistent epigastric abdominal pain and nausea since then. However, reports that he has had some back pain yesterday around 9pm which has progressively worsened despite taking tylenol. The back pain feels "tight" and "squeezing" in nature and is across his entire lower back. He is unable to localize at this time but denies radiation. He denies any trauma to the back or recent heavy lifting. He denies any chest pain, shortness of breath, palpitations, new rash, focal weakness, urinary symptoms, urine or bowel incontinence or diarrhea.    In the ED, the patient was normotensive, tachycardic to 100's, afebrile without tachypnea but did have mild decrease in oxygen saturation for which he required 2L O2 via [place]. Labs significant  for mild leukocytosis and elevated lactic acid to 3.0. MRI of lumbar and thoracic spine obtained that was negative for acute abscess or infectious etiology. CT Angio Chest with right lower subsegmental pulmonary embolism and bronchopneumonia in the right lower lobe with generalized bronchitis and bronchiectasis. Patient given one dose of vancomycin and cefepime and started on heparin gtt for pulmonary embolism.   Medications: No current facility-administered medications on file prior to encounter.   Current Outpatient Medications on File Prior to Encounter  Medication Sig Dispense Refill   Darunavir-Cobicistat-Emtricitabine-Tenofovir Alafenamide (SYMTUZA) 800-150-200-10 MG TABS Take 1 tablet by mouth daily with breakfast. 30 tablet 2   fluconazole (DIFLUCAN) 200 MG tablet Take 1 tablet (200 mg total) by mouth daily. 14 tablet 0   omeprazole (PRILOSEC) 20 MG capsule Take 1 capsule (20 mg total) by mouth daily. Take 2 hours or more after Symtuza.  Spanish 30 capsule 2   sulfamethoxazole-trimethoprim (BACTRIM) 400-80 MG tablet Take 1 tablet by mouth daily. 30 tablet 5    Past Medical History:  Past Medical History:  Diagnosis Date   HIV disease ([place])    Known health problems: none     Social:  Patient lives at home and works in [employer]. He reports drinking a 12-pack of beer every weekend. Denies drinking during the week. Denies any history of tobacco use or illicit drug use.   Family History: Unable to clarify.   Allergies: Allergies as  of 02/21/2021 - Review Complete 02/21/2021  Allergen Reaction Noted   Penicillins Hives and Itching 10/06/2014   Review of Systems: A complete ROS was negative except as per HPI.   OBJECTIVE:  Physical Exam: Blood pressure 117/75, pulse 90, temperature 98.1 F (36.7 C), temperature source Oral, resp. rate (!) 25, height _0  (1.778 m), weight 77.1 kg, SpO2 95 %. Physical Exam  Constitutional: Appears well-developed and well-nourished. No  distress.  HENT: Normocephalic and atraumatic, EOMI, injected conjunctiva Cardiovascular: Normal rate, regular rhythm, S1 and S2 present, no murmurs, rubs, gallops.  Distal pulses intact Respiratory: Effort is normal; lungs clear to auscultation bilaterally  GI: Nondistended, soft, tenderness to palpation in epigastric region, normal bowel sounds Musculoskeletal: Normal bulk and tone. Diffuse tenderness to palpation of lumbar region. No peripheral edema noted. Neurological: Is alert and oriented x4, no apparent focal deficits noted. Skin: Warm and dry.  No rash, erythema, lesions noted. Psychiatric: Normal mood and affect. Behavior is normal. Judgment and thought content normal.   Pertinent Labs: CBC    Component Value Date/Time   WBC 10.8 (H) 02/21/2021 0618   RBC 5.25 02/21/2021 0618   HGB 14.4 02/21/2021 0618   HCT 43.8 02/21/2021 0618   PLT 241 02/21/2021 0618   MCV 83.4 02/21/2021 0618   MCV 79.9 (A) 09/17/2014 1105   MCH 27.4 02/21/2021 0618   MCHC 32.9 02/21/2021 0618   RDW 14.4 02/21/2021 0618   LYMPHSABS 0.2 (L) 02/21/2021 0618   MONOABS 0.3 02/21/2021 0618   EOSABS 0.3 02/21/2021 0618   BASOSABS 0.1 02/21/2021 0618     CMP     Component Value Date/Time   NA 136 02/21/2021 0618   K 4.6 02/21/2021 0618   CL 95 (L) 02/21/2021 0618   CO2 25 02/21/2021 0618   GLUCOSE 125 (H) 02/21/2021 0618   BUN 19 02/21/2021 0618   CREATININE 1.16 02/21/2021 0618   CREATININE 0.78 02/08/2021 1610   CALCIUM 8.8 (L) 02/21/2021 0618   PROT 6.7 02/21/2021 0618   ALBUMIN 3.5 02/21/2021 0618   AST 86 (H) 02/21/2021 0618   ALT 79 (H) 02/21/2021 0618   ALKPHOS 89 02/21/2021 0618   BILITOT 2.3 (H) 02/21/2021 0618   GFRNONAA >60 02/21/2021 0618   GFRNONAA 109 03/30/2019 1623   GFRAA 127 03/30/2019 1623    Pertinent Imaging: DG Chest 2 View  Result Date: 02/21/2021 CLINICAL DATA:  Cough, low back pain.  HIV aids. EXAM: CHEST - 2 VIEW COMPARISON:  07/24/2020 FINDINGS: The heart size  and mediastinal contours are within normal limits. Both lungs are clear. The visualized skeletal structures are unremarkable. Interval clearing of infiltrate in the right lung base. IMPRESSION: No active cardiopulmonary disease. Electronically Signed   By: Franchot Gallo M.D.   On: 02/21/2021 07:21   DG Lumbar Spine Complete  Result Date: 02/21/2021 CLINICAL DATA:  Cough, back pain.  HIV aids. EXAM: LUMBAR SPINE - COMPLETE 4+ VIEW COMPARISON:  MRI lumbar spine 07/29/2020 FINDINGS: There is no evidence of lumbar spine fracture. Alignment is normal. Intervertebral disc spaces are maintained. IMPRESSION: Negative. Electronically Signed   By: Franchot Gallo M.D.   On: 02/21/2021 07:22   CT Angio Chest PE W and/or Wo Contrast  Result Date: 02/21/2021 CLINICAL DATA:  Shortness of breath, evaluate for pulmonary embolism EXAM: CT ANGIOGRAPHY CHEST WITH CONTRAST TECHNIQUE: Multidetector CT imaging of the chest was performed using the standard protocol during bolus administration of intravenous contrast. Multiplanar CT image reconstructions and MIPs were obtained  to evaluate the vascular anatomy. CONTRAST:  21m OMNIPAQUE IOHEXOL 350 MG/ML SOLN COMPARISON:  None. FINDINGS: Cardiovascular: Normal heart size. No pericardial effusion. Pulmonary artery filling defect within a segmental branch of the right lower lobe. Although right lower lobe pulmonary arteries are narrow and hypoenhancing relative to other vessels (likely due to chronic lung disease and shunting) filling defect appears convincing with discrete and tubular shape. Mediastinum/Nodes: Enlarged paratracheal, subcarinal, and bilateral hilar lymph nodes with a few coarse calcifications. Left paratracheal nodal tissue measures up to 11 mm in short axis and subcarinal node is 16 mm in short axis. Lungs/Pleura: Airway thickening with reticulonodular opacity in the right lower lobe. Cylindrical bronchiectasis in right middle and lower lobes. Thin walled irregular  shape cyst in the superior segment right lower lobe measuring 18 mm, crossing the diaphragm where the wall is thicker and more irregular and measuring up to 22 mm in the poste. Rior right upper lobe. Upper Abdomen: Cholecystectomy. Musculoskeletal: No acute finding Review of the MIP images confirms the above findings. These results were called by telephone at the time of interpretation on 02/21/2021 at 11:57 am to provider [facility], who verbally acknowledged these results. IMPRESSION: 1. Positive for segmental pulmonary embolism to the right lower lobe. 2. Bronchopneumonia in the right lower lobe. There is a background of generalized bronchitis and right middle/lower lobe cylindrical bronchiectasis 3. Right pulmonary irregular cyst spanning the major fissure, likely stable from 2020 as permitted by extensive motion artifact on prior. 4. Mild mediastinal adenopathy which may be related to #2. There is also superimposed calcification suggesting chronic granulomatous disease. Electronically Signed   By: JJorje GuildM.D.   On: 02/21/2021 11:57   MR THORACIC SPINE W WO CONTRAST  Result Date: 02/21/2021 CLINICAL DATA:  Back pain EXAM: MRI THORACIC AND LUMBAR SPINE WITHOUT AND WITH CONTRAST TECHNIQUE: Multiplanar and multiecho pulse sequences of the thoracic and lumbar spine were obtained without and with intravenous contrast. CONTRAST:  7.568mGADAVIST GADOBUTROL 1 MMOL/ML IV SOLN COMPARISON:  MRI lumbar spine 07/29/2020 FINDINGS: MRI THORACIC SPINE FINDINGS Alignment:  Normal Vertebrae: Normal bone marrow. Negative for fracture or mass. No evidence of spinal infection. Normal enhancement of the spine. Cord:  Normal signal and morphology.  No cord compression. Paraspinal and other soft tissues: Negative for paraspinous mass, adenopathy, or fluid collection. Disc levels: Small right-sided disc protrusion T2-3 without significant stenosis. Remaining disc spaces appear healthy. No significant stenosis elsewhere.  MRI LUMBAR SPINE FINDINGS Segmentation:  Normal Alignment:  Normal Vertebrae: Normal bone marrow. Negative for fracture, mass, or spinal infection. Conus medullaris: Extends to the L1-2 level and appears normal. Paraspinal and other soft tissues: Negative for paraspinous soft tissue mass, adenopathy, or fluid collection. Disc levels: L1-2: Negative L2-3: Negative L3-4: Mild disc and mild facet degeneration. Disc bulging and facet hypertrophy. Subligamentous 2 mm synovial cyst on the left. Mild to moderate spinal stenosis. Moderate subarticular stenosis bilaterally. L4-5: Disc degeneration with disc space narrowing. Broad-based shallow disc protrusion with associated spurring. Bilateral facet hypertrophy. Mild to moderate spinal stenosis. Moderate subarticular stenosis bilaterally L5-S1: Mild facet degeneration. Mild subarticular stenosis bilaterally. IMPRESSION: 1. Small right-sided disc protrusion at T2-3. Otherwise negative thoracic MRI 2. Disc and facet degeneration causing mild to moderate spinal stenosis L3-4 and L4-5. No change from the prior MRI 07/29/2020. Electronically Signed   By: ChFranchot Gallo.D.   On: 02/21/2021 10:27   MR Lumbar Spine W Wo Contrast  Result Date: 02/21/2021 CLINICAL DATA:  Back pain  EXAM: MRI THORACIC AND LUMBAR SPINE WITHOUT AND WITH CONTRAST TECHNIQUE: Multiplanar and multiecho pulse sequences of the thoracic and lumbar spine were obtained without and with intravenous contrast. CONTRAST:  7.73m GADAVIST GADOBUTROL 1 MMOL/ML IV SOLN COMPARISON:  MRI lumbar spine 07/29/2020 FINDINGS: MRI THORACIC SPINE FINDINGS Alignment:  Normal Vertebrae: Normal bone marrow. Negative for fracture or mass. No evidence of spinal infection. Normal enhancement of the spine. Cord:  Normal signal and morphology.  No cord compression. Paraspinal and other soft tissues: Negative for paraspinous mass, adenopathy, or fluid collection. Disc levels: Small right-sided disc protrusion T2-3 without  significant stenosis. Remaining disc spaces appear healthy. No significant stenosis elsewhere. MRI LUMBAR SPINE FINDINGS Segmentation:  Normal Alignment:  Normal Vertebrae: Normal bone marrow. Negative for fracture, mass, or spinal infection. Conus medullaris: Extends to the L1-2 level and appears normal. Paraspinal and other soft tissues: Negative for paraspinous soft tissue mass, adenopathy, or fluid collection. Disc levels: L1-2: Negative L2-3: Negative L3-4: Mild disc and mild facet degeneration. Disc bulging and facet hypertrophy. Subligamentous 2 mm synovial cyst on the left. Mild to moderate spinal stenosis. Moderate subarticular stenosis bilaterally. L4-5: Disc degeneration with disc space narrowing. Broad-based shallow disc protrusion with associated spurring. Bilateral facet hypertrophy. Mild to moderate spinal stenosis. Moderate subarticular stenosis bilaterally L5-S1: Mild facet degeneration. Mild subarticular stenosis bilaterally. IMPRESSION: 1. Small right-sided disc protrusion at T2-3. Otherwise negative thoracic MRI 2. Disc and facet degeneration causing mild to moderate spinal stenosis L3-4 and L4-5. No change from the prior MRI 07/29/2020. Electronically Signed   By: CFranchot GalloM.D.   On: 02/21/2021 10:27    EKG: personally reviewed my interpretation is pending  ASSESSMENT & PLAN:  Assessment: Active Problems:   Community acquired pneumonia   [place] 45y.o[place] with pertinent PMH of HIV/AIDS (CD4 count undetectable) who presented with back pain and admit for community acquired pneumonia and pulmonary embolism on hospital day 0  Plan: #RLL bronchopneumonia  #RLL segmental PE  Patient presented with two weeks of productive cough, shortness of breath, subjective fevers/chills, night sweats and weight loss. He is intermittently compliant with his antiretroviral regimen. He was noted to have right lower lower pulmonary embolus with superimposed bronchopneumonia. He  has a history of MAC. Patient has been worked up for tuberculosis several times previously including quantiferon gold and AFB smears. These have been negative and patient improved with empiric treatment. Patient received vancomycin and cefepime in the ED for concerns sepsis.  - Add azithromycin, can likely narrow to rocephin in AM - AFB smear  and acid fast culture - Expectorated sputum culture - F/u blood cultures - F/u EKG and Echo to evaluate for RH strain  - Continue Heparin gtt; can likely transition to DPurdinin AM  #Abdominal pain Patient notes ongoing epigastric abdominal pain with two episodes of nausea/vomiting two days prior to admission. He does have ongoing nausea but denies any further episodes of emesis. He denies any diarrhea. He reports abdominal pain that is radiating across the upper abdomen and I suspect this may also be radiating to his back and contributing to some of his back pain. He does note history of binge drinking on the weekends. LFT's mildly elevated on labs; however, RUQ without acute findings.  - Lipase level - Suspect acute pancreatitis - IV dilaudid 0.5-127mprn and CLD  - Hepatitis panel  - Continue to monitor   #HIV/AIDS  Patient with history of HIV/AIDS diagnosed two years ago. His CD4 count  has been undetectable since diagnosis. He has a history of medication nonadherence; however reports that he has been taking his symtuza since recent office visit.  - Continue Symtuza 1 tablet daily  - Continue Bactrim 1 tablet daily   #Back pain Patient reports mid and lower back pain that started yesterday evening. He notes this was mild initially but gradually worsened overnight. He reports that he tried taking tylenol for this which had mild relief. MRI of thoracic and lumbar spine without acute infectious etiology noted. Exam is more consistent with musculoskeletal vs referred etiology - Evaluation for acute pancreatitis as above  - Flexeril 25m tid prn  Best  Practice: Diet: Clear liquid diet IVF: Fluids: LR, Rate:  2000 cc bolus VTE: Heparin gtt  Code: Full AB: Azithromycin, Cefepime  Status: Inpatient with expected length of stay greater than 2 midnights. Anticipated Discharge Location: Home Barriers to Discharge: Medical stability  Signature: SHarvie Heck MD Internal Medicine Resident, PGY-3 [facility] PontesInternal Medicine Residency  Pager: #[phone]:57 PM, 02/21/2021   Please contact the on call pager after 5 pm and on weekends at [phone]

## 2021-02-21 NOTE — Progress Notes (Addendum)
ANTICOAGULATION CONSULT NOTE - Initial Consult  Pharmacy Consult for heparin Indication: pulmonary embolus  Allergies  Allergen Reactions   Penicillins Hives and Itching    Has patient had a PCN reaction causing immediate rash, facial/tongue/throat swelling, SOB or lightheadedness with hypotension: Yes Has patient had a PCN reaction causing severe rash involving mucus membranes or skin necrosis: No Has patient had a PCN reaction that required hospitalization No Has patient had a PCN reaction occurring within the last 10 years: No If all of the above answers are "NO", then may proceed with Cephalosporin use.    Patient Measurements: Height: 5\' 10"  (177.8 cm) Weight: 77.1 kg (170 lb) IBW/kg (Calculated) : 73 Heparin Dosing Weight: 77.1 kg  Vital Signs: Temp: 98.1 F (36.7 C) (09/15 0802) Temp Source: Oral (09/15 0802) BP: 117/75 (09/15 1315) Pulse Rate: 90 (09/15 1315)  Labs: Recent Labs    02/21/21 0618 02/21/21 1255  HGB 14.4  --   HCT 43.8  --   PLT 241  --   APTT  --  34  LABPROT  --  16.5*  INR  --  1.3*  CREATININE 1.16  --      Estimated Creatinine Clearance: 83 mL/min (by C-G formula based on SCr of 1.16 mg/dL).   Medical History: Past Medical History:  Diagnosis Date   HIV disease ([facility])    Known health problems: none     Medications:  Infusions:   azithromycin     ceFEPime (MAXIPIME) IV     heparin 1,300 Units/hr (02/21/21 1244)   vancomycin     vancomycin 1,500 mg (02/21/21 1337)    Assessment: 45 yo male presenting with persistent cough found to have new segmental PE to R lower lobe on CTA. No AC PTA. Pharmacy consulted for heparin dosing.  Heparin level of 0.25 is subtherapeutic after initial bolus and 1300 units/hr infusion. Nurse reported that infusion was possible infiltrated for unknown duration. CBC is stable - Hgb 14.4, plt 241. No signs of bleeding noted.    Goal of Therapy:  Heparin level 0.3-0.7 units/ml Monitor platelets by  anticoagulation protocol: Yes   Plan:  No bolus d/t possible line infiltration Increase heparin infusion to 1450 units/hr 6 hour heparin level Daily CBC, heparin level Monitor for s/sx of bleeding  54, PharmD PGY1 Pharmacy Resident 02/21/2021  2:09 PM  Please check [email or web address] for unit-specific pharmacy phone numbers.

## 2021-02-21 NOTE — ED Provider Notes (Addendum)
Emergency Medicine Provider Triage Evaluation Note  [facility] McKee , a 45 y.o. male  was evaluated in triage.  Pt complains of diffuse back pain-- starts in low back and radiates upwards.  States pain was initially slow to come on but got acutely worse this morning.  Denies injury/trauma/falls.  He reports his legs feel weak and he is having trouble walking.  Denies focal numbness of the legs or bowel/bladder incontinence.  Has also been coughing for about 2 weeks, significant weight loss over the past 6 months.  Also reports urine is yellow.  He has known HIV/AIDS.  Recent visit with ID 02/08/21-- had not been taking medications for quite some time prior to that visit  Review of Systems  Positive: Back pain, cough, weight loss Negative: fever  Physical Exam  BP 109/63 (BP Location: Left Arm)   Pulse (!) 109   Temp 98.8 F (37.1 C) (Oral)   Resp 16   SpO2 96%   Gen:   Awake, thin, chronically ill appearing  Resp:  Normal effort, wet cough noted in triage MSK:   Moves extremities without difficulty  Other:  Tender along lumbar spine, no deformity or rash noted  Medical Decision Making  Medically screening exam initiated at 5:45 AM.  Appropriate orders placed.  [facility] Michel Santee was informed that the remainder of the evaluation will be completed by another provider, this initial triage assessment does not replace that evaluation, and the importance of remaining in the ED until their evaluation is complete.  Last CD4 count on 02/08/21 was 37.  He is at severely increased risk for opportunistic infection.  He is actively coughing in triage and appears quite unwell.  He is not febrile but is tachycardic, BP stable.  Hospitalized in February for sepsis.  Will initiate sepsis work-up with labs, cultures, CXR, covid screen.    Made acuity 2, will prioritize room assignment.   Garlon Hatchet, PA-C 02/21/21 0606    Garlon Hatchet, PA-C 02/21/21 1696    Zadie Rhine,  MD 02/21/21 [phone]

## 2021-02-21 NOTE — ED Provider Notes (Signed)
[name] EMERGENCY DEPARTMENT Provider Note   CSN: [id number] Arrival date & time: 02/21/21  0543     History Chief Complaint  Patient presents with   Back Pain    [facility] Jon Love is a 45 y.o. male with history of HIV/AIDS who presents to the emergency department today for further evaluation of back pain that began last night.  He denies any injury or trauma to the area.  His back pain radiates down the bilateral lower extremities.  Reports feeling unsteady on his feet.  Denies any focal weakness/numbness, saddle anesthesia, bowel/bladder incontinence, fever, vomiting, diarrhea.  He also has been complaining of a nagging cough over the last couple of weeks which exacerbates his back pain.  Reports associated chills, sore throat,  and headache with this. Patient states he has been compliant with his medications and has not missed any doses.  Per chart review, last visit with infectious disease on 2 September revealed that he has been noncompliant for a couple of months.  Was on prophylactic Bactrim and was prescribed fluconazole for possible oral candidiasis.  His most recent CD4 count at that visit was 37.  The history is provided by the patient. A language interpreter was used.  Back Pain     Past Medical History:  Diagnosis Date   HIV disease (HCC)    Known health problems: none     Patient Active Problem List   Diagnosis Date Noted   Encounter for long-term (current) use of high-risk medication 02/08/2021   Routine screening for STI (sexually transmitted infection) 02/08/2021   At risk for opportunistic infections 02/08/2021   Healthcare maintenance 08/09/2020   Immigrant with language difficulty    Cachexia (HCC)    Poor dentition    HIV infection (HCC) 12/08/2018   Microcytic anemia 12/08/2018   Alcohol use 12/05/2018   Thrush of mouth and esophagus (HCC) 03/21/2016   Anemia 08/16/2015   AIDS (acquired immune deficiency syndrome) (HCC)     Gastroesophageal reflux disease without esophagitis    Gout 10/12/2014   Cholelithiasis 10/07/2014   Protein-calorie malnutrition, severe (HCC) 10/07/2014   Intrahepatic bile duct stones 10/07/2014   Choledocholithiasis    Abnormal transaminases 10/06/2014    Past Surgical History:  Procedure Laterality Date   CHOLECYSTECTOMY N/A 10/11/2014   Procedure: LAPAROSCOPIC CHOLECYSTECTOMY WITH INTRAOPERATIVE CHOLANGIOGRAM;  Surgeon: Abigail Miyamoto, MD;  Location: [facility] OR;  Service: General;  Laterality: N/A;   ERCP N/A 10/09/2014   Procedure: ENDOSCOPIC RETROGRADE CHOLANGIOPANCREATOGRAPHY (ERCP);  Surgeon: Jeani Hawking, MD;  Location: [facility] ENDOSCOPY;  Service: Endoscopy;  Laterality: N/A;       No family history on file.  Social History   Tobacco Use   Smoking status: Never   Smokeless tobacco: Never  Vaping Use   Vaping Use: Never used  Substance Use Topics   Alcohol use: Yes    Alcohol/week: 1.0 standard drink    Types: 1 Cans of beer per week    Comment: socially   Drug use: Not Currently    Home Medications Prior to Admission medications   Medication Sig Start Date End Date Taking? Authorizing Provider  Darunavir-Cobicistat-Emtricitabine-Tenofovir Alafenamide [facility]) [id number] MG TABS Take 1 tablet by mouth daily with breakfast. 02/08/21   Comer, Belia Heman, MD  fluconazole (DIFLUCAN) 200 MG tablet Take 1 tablet (200 mg total) by mouth daily. 02/08/21   Gardiner Barefoot, MD  omeprazole (PRILOSEC) 20 MG capsule Take 1 capsule (20 mg total) by mouth daily. Take 2 hours  or more after Symtuza.  Spanish 02/08/21   Gardiner Barefoot, MD  sulfamethoxazole-trimethoprim (BACTRIM) 400-80 MG tablet Take 1 tablet by mouth daily. 02/08/21   Comer, Belia Heman, MD    Allergies    Penicillins  Review of Systems   Review of Systems  Musculoskeletal:  Positive for back pain.  All other systems reviewed and are negative.  Physical Exam Updated Vital Signs BP 118/69   Pulse 88   Temp 98.1 F  (36.7 C) (Oral)   Resp 18   Ht  (1.778 m)   Wt 77.1 kg   SpO2 98%   BMI 24.39 kg/m   Physical Exam Vitals reviewed.  Constitutional:      General: He is not in acute distress.    Appearance: Normal appearance.  HENT:     Head: Normocephalic and atraumatic.  Eyes:     General:        Right eye: No discharge.        Left eye: No discharge.  Cardiovascular:     Comments: Regular rate and rhythm.  S1/S2 are distinct without any evidence of murmur, rubs, or gallops.  Radial pulses are 2+ bilaterally.  Dorsalis pedis pulses are 2+ bilaterally.  No evidence of pedal edema. Pulmonary:     Comments: There is moderate diffuse expiratory wheeze heard throughout all lung fields.  Normal effort. Abdominal:     General: Abdomen is flat. Bowel sounds are normal. There is no distension.     Tenderness: There is no abdominal tenderness. There is no guarding or rebound.  Musculoskeletal:        General: Normal range of motion.     Cervical back: Neck supple.     Comments: There is diffuse tenderness down the entire midline thoracic and lumbar spine.  Moderate paralumbar tenderness bilaterally.  5/5 strength to the lower extremities.  Normal sensation to the lower extremities.  5/5 strength to the upper extremities.  Decreased subjective sensation to the left upper extremity along the forearm.  Skin:    General: Skin is warm and dry.     Findings: No rash.     Comments: No rashes.  Neurological:     General: No focal deficit present.     Mental Status: He is alert.  Psychiatric:        Mood and Affect: Mood normal.        Behavior: Behavior normal.    ED Results / Procedures / Treatments   Labs (all labs ordered are listed, but only abnormal results are displayed) Labs Reviewed  CBC WITH DIFFERENTIAL/PLATELET - Abnormal; Notable for the following components:      Result Value   WBC 10.8 (*)    Neutro Abs 9.8 (*)    Lymphs Abs 0.2 (*)    Abs Immature Granulocytes 0.20 (*)     All other components within normal limits  COMPREHENSIVE METABOLIC PANEL - Abnormal; Notable for the following components:   Chloride 95 (*)    Glucose, Bld 125 (*)    Calcium 8.8 (*)    AST 86 (*)    ALT 79 (*)    Total Bilirubin 2.3 (*)    Anion gap 16 (*)    All other components within normal limits  LACTIC ACID, PLASMA - Abnormal; Notable for the following components:   Lactic Acid, Venous 3.0 (*)    All other components within normal limits  T-HELPER CELLS (CD4) COUNT (NOT AT [facility]) - Abnormal; Notable for the  following components:   CD4 T Cell Abs <35 (*)    CD4 % Helper T Cell 4 (*)    All other components within normal limits  RESP PANEL BY RT-PCR (FLU A&B, COVID) ARPGX2  CULTURE, BLOOD (ROUTINE X 2)  CULTURE, BLOOD (ROUTINE X 2)  URINE CULTURE  EXPECTORATED SPUTUM ASSESSMENT W GRAM STAIN, RFLX TO RESP C  ACID FAST SMEAR (AFB, MYCOBACTERIA)  ACID FAST CULTURE WITH REFLEXED SENSITIVITIES (MYCOBACTERIA)  LACTIC ACID, PLASMA  URINALYSIS, ROUTINE W REFLEX MICROSCOPIC  LACTIC ACID, PLASMA  APTT  PROTIME-INR  HEPARIN LEVEL (UNFRACTIONATED)  LIPASE, BLOOD    EKG None  Radiology DG Chest 2 View  Result Date: 02/21/2021 CLINICAL DATA:  Cough, low back pain.  HIV aids. EXAM: CHEST - 2 VIEW COMPARISON:  07/24/2020 FINDINGS: The heart size and mediastinal contours are within normal limits. Both lungs are clear. The visualized skeletal structures are unremarkable. Interval clearing of infiltrate in the right lung base. IMPRESSION: No active cardiopulmonary disease. Electronically Signed   By: Marlan Palau M.D.   On: 02/21/2021 07:21   DG Lumbar Spine Complete  Result Date: 02/21/2021 CLINICAL DATA:  Cough, back pain.  HIV aids. EXAM: LUMBAR SPINE - COMPLETE 4+ VIEW COMPARISON:  MRI lumbar spine 07/29/2020 FINDINGS: There is no evidence of lumbar spine fracture. Alignment is normal. Intervertebral disc spaces are maintained. IMPRESSION: Negative. Electronically Signed   By:  Marlan Palau M.D.   On: 02/21/2021 07:22   CT Angio Chest PE W and/or Wo Contrast  Result Date: 02/21/2021 CLINICAL DATA:  Shortness of breath, evaluate for pulmonary embolism EXAM: CT ANGIOGRAPHY CHEST WITH CONTRAST TECHNIQUE: Multidetector CT imaging of the chest was performed using the standard protocol during bolus administration of intravenous contrast. Multiplanar CT image reconstructions and MIPs were obtained to evaluate the vascular anatomy. CONTRAST:  17mL OMNIPAQUE IOHEXOL 350 MG/ML SOLN COMPARISON:  None. FINDINGS: Cardiovascular: Normal heart size. No pericardial effusion. Pulmonary artery filling defect within a segmental branch of the right lower lobe. Although right lower lobe pulmonary arteries are narrow and hypoenhancing relative to other vessels (likely due to chronic lung disease and shunting) filling defect appears convincing with discrete and tubular shape. Mediastinum/Nodes: Enlarged paratracheal, subcarinal, and bilateral hilar lymph nodes with a few coarse calcifications. Left paratracheal nodal tissue measures up to 11 mm in short axis and subcarinal node is 16 mm in short axis. Lungs/Pleura: Airway thickening with reticulonodular opacity in the right lower lobe. Cylindrical bronchiectasis in right middle and lower lobes. Thin walled irregular shape cyst in the superior segment right lower lobe measuring 18 mm, crossing the diaphragm where the wall is thicker and more irregular and measuring up to 22 mm in the poste. Rior right upper lobe. Upper Abdomen: Cholecystectomy. Musculoskeletal: No acute finding Review of the MIP images confirms the above findings. These results were called by telephone at the time of interpretation on 02/21/2021 at 11:57 am to provider [facility] , who verbally acknowledged these results. IMPRESSION: 1. Positive for segmental pulmonary embolism to the right lower lobe. 2. Bronchopneumonia in the right lower lobe. There is a background of generalized  bronchitis and right middle/lower lobe cylindrical bronchiectasis 3. Right pulmonary irregular cyst spanning the major fissure, likely stable from 2020 as permitted by extensive motion artifact on prior. 4. Mild mediastinal adenopathy which may be related to #2. There is also superimposed calcification suggesting chronic granulomatous disease. Electronically Signed   By: Tiburcio Pea M.D.   On: 02/21/2021 11:57  MR THORACIC SPINE W WO CONTRAST  Result Date: 02/21/2021 CLINICAL DATA:  Back pain EXAM: MRI THORACIC AND LUMBAR SPINE WITHOUT AND WITH CONTRAST TECHNIQUE: Multiplanar and multiecho pulse sequences of the thoracic and lumbar spine were obtained without and with intravenous contrast. CONTRAST:  7.55mL GADAVIST GADOBUTROL 1 MMOL/ML IV SOLN COMPARISON:  MRI lumbar spine 07/29/2020 FINDINGS: MRI THORACIC SPINE FINDINGS Alignment:  Normal Vertebrae: Normal bone marrow. Negative for fracture or mass. No evidence of spinal infection. Normal enhancement of the spine. Cord:  Normal signal and morphology.  No cord compression. Paraspinal and other soft tissues: Negative for paraspinous mass, adenopathy, or fluid collection. Disc levels: Small right-sided disc protrusion T2-3 without significant stenosis. Remaining disc spaces appear healthy. No significant stenosis elsewhere. MRI LUMBAR SPINE FINDINGS Segmentation:  Normal Alignment:  Normal Vertebrae: Normal bone marrow. Negative for fracture, mass, or spinal infection. Conus medullaris: Extends to the L1-2 level and appears normal. Paraspinal and other soft tissues: Negative for paraspinous soft tissue mass, adenopathy, or fluid collection. Disc levels: L1-2: Negative L2-3: Negative L3-4: Mild disc and mild facet degeneration. Disc bulging and facet hypertrophy. Subligamentous 2 mm synovial cyst on the left. Mild to moderate spinal stenosis. Moderate subarticular stenosis bilaterally. L4-5: Disc degeneration with disc space narrowing. Broad-based shallow disc  protrusion with associated spurring. Bilateral facet hypertrophy. Mild to moderate spinal stenosis. Moderate subarticular stenosis bilaterally L5-S1: Mild facet degeneration. Mild subarticular stenosis bilaterally. IMPRESSION: 1. Small right-sided disc protrusion at T2-3. Otherwise negative thoracic MRI 2. Disc and facet degeneration causing mild to moderate spinal stenosis L3-4 and L4-5. No change from the prior MRI 07/29/2020. Electronically Signed   By: Marlan Palau M.D.   On: 02/21/2021 10:27   MR Lumbar Spine W Wo Contrast  Result Date: 02/21/2021 CLINICAL DATA:  Back pain EXAM: MRI THORACIC AND LUMBAR SPINE WITHOUT AND WITH CONTRAST TECHNIQUE: Multiplanar and multiecho pulse sequences of the thoracic and lumbar spine were obtained without and with intravenous contrast. CONTRAST:  7.17mL GADAVIST GADOBUTROL 1 MMOL/ML IV SOLN COMPARISON:  MRI lumbar spine 07/29/2020 FINDINGS: MRI THORACIC SPINE FINDINGS Alignment:  Normal Vertebrae: Normal bone marrow. Negative for fracture or mass. No evidence of spinal infection. Normal enhancement of the spine. Cord:  Normal signal and morphology.  No cord compression. Paraspinal and other soft tissues: Negative for paraspinous mass, adenopathy, or fluid collection. Disc levels: Small right-sided disc protrusion T2-3 without significant stenosis. Remaining disc spaces appear healthy. No significant stenosis elsewhere. MRI LUMBAR SPINE FINDINGS Segmentation:  Normal Alignment:  Normal Vertebrae: Normal bone marrow. Negative for fracture, mass, or spinal infection. Conus medullaris: Extends to the L1-2 level and appears normal. Paraspinal and other soft tissues: Negative for paraspinous soft tissue mass, adenopathy, or fluid collection. Disc levels: L1-2: Negative L2-3: Negative L3-4: Mild disc and mild facet degeneration. Disc bulging and facet hypertrophy. Subligamentous 2 mm synovial cyst on the left. Mild to moderate spinal stenosis. Moderate subarticular stenosis  bilaterally. L4-5: Disc degeneration with disc space narrowing. Broad-based shallow disc protrusion with associated spurring. Bilateral facet hypertrophy. Mild to moderate spinal stenosis. Moderate subarticular stenosis bilaterally L5-S1: Mild facet degeneration. Mild subarticular stenosis bilaterally. IMPRESSION: 1. Small right-sided disc protrusion at T2-3. Otherwise negative thoracic MRI 2. Disc and facet degeneration causing mild to moderate spinal stenosis L3-4 and L4-5. No change from the prior MRI 07/29/2020. Electronically Signed   By: Marlan Palau M.D.   On: 02/21/2021 10:27    Procedures .Critical Care Performed by: Teressa Lower, PA-C Authorized by: Honor Loh  M, PA-C   Critical care provider statement:    Critical care time (minutes):  45   Critical care time was exclusive of:  Separately billable procedures and treating other patients   Critical care was necessary to treat or prevent imminent or life-threatening deterioration of the following conditions:  Sepsis and respiratory failure   Critical care was time spent personally by me on the following activities:  Blood draw for specimens, ordering and performing treatments and interventions, ordering and review of laboratory studies, development of treatment plan with patient or surrogate, discussions with consultants, ordering and review of radiographic studies, pulse oximetry, review of old charts, obtaining history from patient or surrogate, examination of patient and re-evaluation of patient's condition   I assumed direction of critical care for this patient from another provider in my specialty: no     Care discussed with: admitting provider     Medications Ordered in ED Medications  vancomycin (VANCOREADY) IVPB 1500 mg/300 mL (has no administration in time range)  ceFEPIme (MAXIPIME) 2 g in sodium chloride 0.9 % 100 mL IVPB (has no administration in time range)  vancomycin (VANCOCIN) IVPB 1000 mg/200 mL premix (has no  administration in time range)  heparin ADULT infusion 100 units/mL (25000 units/23mL) (1,300 Units/hr Intravenous New Bag/Given 02/21/21 1244)  sodium chloride 0.9 % bolus 1,000 mL (0 mLs Intravenous Stopped 02/21/21 1058)  fentaNYL (SUBLIMAZE) injection 50 mcg (50 mcg Intravenous Given 02/21/21 0848)  ondansetron (ZOFRAN) injection 4 mg (4 mg Intravenous Given 02/21/21 0847)  ceFEPIme (MAXIPIME) 2 g in sodium chloride 0.9 % 100 mL IVPB (2 g Intravenous New Bag/Given 02/21/21 1147)  gadobutrol (GADAVIST) 1 MMOL/ML injection 7.5 mL (7.5 mLs Intravenous Contrast Given 02/21/21 1014)  iohexol (OMNIPAQUE) 350 MG/ML injection 40 mL (40 mLs Intravenous Contrast Given 02/21/21 1117)  sodium chloride 0.9 % bolus 1,000 mL (1,000 mLs Intravenous New Bag/Given 02/21/21 1246)  ketorolac (TORADOL) 15 MG/ML injection 15 mg (15 mg Intravenous Given 02/21/21 1232)  heparin bolus via infusion 6,000 Units (6,000 Units Intravenous Bolus from Bag 02/21/21 1238)    ED Course  I have reviewed the triage vital signs and the nursing notes.  Pertinent labs & imaging results that were available during my care of the patient were reviewed by me and considered in my medical decision making (see chart for details).  Clinical Course as of 02/21/21 1326  Thu Feb 21, 2021  0355 Patient noted to dip into the high 80's on RA during my interview. Was placed on 2L of supplemental oxygen for patient comfort. Currently at 94%.  [CF]  0815 I discussed this case with my attending physician who cosigned this note including patient's presenting symptoms, physical exam, and planned diagnostics and interventions. Attending physician stated agreement with plan or made changes to plan which were implemented.     [CF]  1144 Spoke with radiology, he has a pulmonary embolism and overlying pneumonia.  [CF]  1211 Spoke with Eliezer Bottom MD who will admit the patient to the internal medicine service.  [CF]    Clinical Course User Index [CF]  Jolyn Lent   MDM Rules/Calculators/A&P                          [facility] Jon Love is a 45 y.o. male with history of poorly controlled HIV/AIDS who presents the emergency department today for further evaluation of back pain and cough.  On exam, he was diffusely tender over the  entire thoracic and lumbar spine.  Given that his most recent CD4 count was 37 I was concerned for possible abscess or spinal infection.  Patient was coughing throughout the exam with diffuse expiratory wheeze and no smoking history. Again given his low CD4 count I was suspicious for underlying infection.   Initial blood work, chest x-ray, and lumbar x-ray ordered in triage.  CBC revealed mild leukocytosis this is significant given his HIV/AIDS history.  Initial lactic was 3.0.  I rehydrated him with 2 L of normal saline.  Second lactic is pending.  CMP revealed elevated bilirubin and mild transaminitis.  Urinalysis and blood cultures are pending.  CD4 count is pending.  MRI of the lumbar and thoracic spine revealed mild bulging disc at the T2-T3.  CT angio revealed right lower subsegmental pulmonary embolism overlying pneumonia.   Given the clinical scenario, I have started him on a heparin drip with vancomycin and cefepime for broad-spectrum coverage of the overlying pneumonia.  I believe he would benefit from further evaluation in the hospital will be admitted to internal medicine resident service.  Final Clinical Impression(s) / ED Diagnoses Final diagnoses:  Pulmonary embolism, unspecified chronicity, unspecified pulmonary embolism type, unspecified whether acute cor pulmonale present [facility])    Rx / DC Orders ED Discharge Orders     None        Teressa Lower, [place] 02/21/21 1329    Pricilla Loveless, MD 02/21/21 1454

## 2021-02-21 NOTE — Progress Notes (Addendum)
Paged to bedside by nurse due to hypotension with blood pressures running with MAPs in the low 60s high 50s which started around 8:45. Ordered a 1L fluid bolus of LR and went to beside to assess patient. He was mentating well and was resting comfortably in bed in NAD. History was obtained using a Nurse, learning disability. Patient said that he was feeling fine, but had been having a lot of pain in his back and had just recently received a dose of IV pain medication for his pain. Around that time he endorses feeling a little dizzy but that he feels better now. With fluids running patient's MAPs improved to the 70s. On exam his heart was RRR, no m/r/g, lungs were CTAB. Patient is stable at this time. Low suspicion for RH strain. Will decrease dilaudid dose to 0.5 mg Q6 PRN from 1 mg Q4 PRN.

## 2021-02-21 NOTE — ED Notes (Signed)
Patient transported to CT 

## 2021-02-21 NOTE — ED Triage Notes (Signed)
Per EMS, pt from home c/o back pain.   114/90 95HR 98%RA  With the use of a translator discovered pain is in lower back and radiates up, no apparent injury.  Pt does have a cough (two weeks) and reports his legs are weak. Pt reports he has a surgery "on my spine to take out the virus that was there."

## 2021-02-21 NOTE — Progress Notes (Signed)
ANTICOAGULATION CONSULT NOTE - Initial Consult  Pharmacy Consult for heparin Indication: pulmonary embolus  Allergies  Allergen Reactions   Penicillins Hives and Itching    Has patient had a PCN reaction causing immediate rash, facial/tongue/throat swelling, SOB or lightheadedness with hypotension: Yes Has patient had a PCN reaction causing severe rash involving mucus membranes or skin necrosis: No Has patient had a PCN reaction that required hospitalization No Has patient had a PCN reaction occurring within the last 10 years: No If all of the above answers are "NO", then may proceed with Cephalosporin use.    Patient Measurements: Height: 5\' 10"  (177.8 cm) Weight: 77.1 kg (170 lb) IBW/kg (Calculated) : 73 Heparin Dosing Weight: 77.1 kg  Vital Signs: Temp: 98.1 F (36.7 C) (09/15 0802) Temp Source: Oral (09/15 0802) BP: 93/74 (09/15 1132) Pulse Rate: 78 (09/15 1132)  Labs: Recent Labs    02/21/21 0618  HGB 14.4  HCT 43.8  PLT 241  CREATININE 1.16    Estimated Creatinine Clearance: 83 mL/min (by C-G formula based on SCr of 1.16 mg/dL).   Medical History: Past Medical History:  Diagnosis Date   HIV disease (HCC)    Known health problems: none     Medications: see MAR  Assessment: 45 yo M with new segmental PE to R lower lobe. No AC PTA. CBC baseline is wnl.  Goal of Therapy:  Heparin level 0.3-0.7 units/ml Monitor platelets by anticoagulation protocol: Yes   Plan:  Give 6000 units bolus x 1 Start heparin infusion at 1300 units/hr Check anti-Xa level in 6 hours and daily while on heparin Continue to monitor H&H and platelets  54, PharmD, [facility] Emergency Medicine Clinical Pharmacist ED RPh Phone: [phone] Main RX: [phone]

## 2021-02-22 ENCOUNTER — Inpatient Hospital Stay (HOSPITAL_COMMUNITY): Payer: Self-pay

## 2021-02-22 DIAGNOSIS — I2693 Single subsegmental pulmonary embolism without acute cor pulmonale: Secondary | ICD-10-CM

## 2021-02-22 DIAGNOSIS — I2699 Other pulmonary embolism without acute cor pulmonale: Secondary | ICD-10-CM

## 2021-02-22 LAB — CBC
HCT: 34 % — ABNORMAL LOW (ref 39.0–52.0)
HCT: 36.1 % — ABNORMAL LOW (ref 39.0–52.0)
Hemoglobin: 11.2 g/dL — ABNORMAL LOW (ref 13.0–17.0)
Hemoglobin: 11.8 g/dL — ABNORMAL LOW (ref 13.0–17.0)
MCH: 27.5 pg (ref 26.0–34.0)
MCH: 27.4 pg (ref 26.0–34.0)
MCHC: 32.9 g/dL (ref 30.0–36.0)
MCHC: 32.7 g/dL (ref 30.0–36.0)
MCV: 83.5 fL (ref 80.0–100.0)
MCV: 84 fL (ref 80.0–100.0)
Platelets: 122 10*3/uL — ABNORMAL LOW (ref 150–400)
Platelets: 141 10*3/uL — ABNORMAL LOW (ref 150–400)
RBC: 4.07 MIL/uL — ABNORMAL LOW (ref 4.22–5.81)
RBC: 4.3 MIL/uL (ref 4.22–5.81)
RDW: 14.5 % (ref 11.5–15.5)
RDW: 14.6 % (ref 11.5–15.5)
WBC: 6.8 10*3/uL (ref 4.0–10.5)
WBC: 6.1 10*3/uL (ref 4.0–10.5)
nRBC: 0 % (ref 0.0–0.2)
nRBC: 0 % (ref 0.0–0.2)

## 2021-02-22 LAB — ECHOCARDIOGRAM COMPLETE
AR max vel: 3.05 cm2
AV Area VTI: 2.48 cm2
AV Area mean vel: 2.68 cm2
AV Mean grad: 3 mmHg
AV Peak grad: 3.7 mmHg
Ao pk vel: 0.96 m/s
Area-P 1/2: 2.39 cm2
Height: 70 in
S' Lateral: 2.7 cm
Single Plane A4C EF: 65.9 %
Weight: 2720 oz

## 2021-02-22 LAB — TECHNOLOGIST SMEAR REVIEW: Plt Morphology: NORMAL

## 2021-02-22 LAB — RETICULOCYTES
Immature Retic Fract: 15.5 % (ref 2.3–15.9)
RBC.: 4.33 MIL/uL (ref 4.22–5.81)
Retic Count, Absolute: 65 10*3/uL (ref 19.0–186.0)
Retic Ct Pct: 1.5 % (ref 0.4–3.1)

## 2021-02-22 LAB — URINE CULTURE: Culture: NO GROWTH

## 2021-02-22 LAB — STREP PNEUMONIAE URINARY ANTIGEN: Strep Pneumo Urinary Antigen: NEGATIVE

## 2021-02-22 LAB — HEPARIN LEVEL (UNFRACTIONATED)
Heparin Unfractionated: 0.14 IU/mL — ABNORMAL LOW (ref 0.30–0.70)
Heparin Unfractionated: 0.1 IU/mL — ABNORMAL LOW (ref 0.30–0.70)
Heparin Unfractionated: 0.31 IU/mL (ref 0.30–0.70)

## 2021-02-22 LAB — SAVE SMEAR(SSMR), FOR PROVIDER SLIDE REVIEW

## 2021-02-22 MED ORDER — WARFARIN SODIUM 2.5 MG PO TABS
2.5000 mg | ORAL_TABLET | Freq: Once | ORAL | Status: AC
  Administered 2021-02-22: 2.5 mg via ORAL
  Filled 2021-02-22: qty 1

## 2021-02-22 MED ORDER — APIXABAN 5 MG PO TABS: 10.0000 mg | ORAL_TABLET | Freq: Two times a day (BID) | ORAL | Status: DC

## 2021-02-22 MED ORDER — HEPARIN BOLUS VIA INFUSION
2250.0000 [IU] | Freq: Once | INTRAVENOUS | Status: AC
  Administered 2021-02-22: 2250 [IU] via INTRAVENOUS
  Filled 2021-02-22: qty 2250

## 2021-02-22 MED ORDER — WARFARIN - PHARMACIST DOSING INPATIENT: Freq: Every day | Status: DC

## 2021-02-22 MED ORDER — APIXABAN 5 MG PO TABS: 5.0000 mg | ORAL_TABLET | Freq: Two times a day (BID) | ORAL | Status: DC

## 2021-02-22 MED ORDER — ENOXAPARIN SODIUM 80 MG/0.8ML IJ SOSY
77.0000 mg | PREFILLED_SYRINGE | Freq: Two times a day (BID) | INTRAMUSCULAR | Status: DC
  Administered 2021-02-22 – 2021-02-25 (×6): 77 mg via SUBCUTANEOUS
  Filled 2021-02-22 (×6): qty 0.8

## 2021-02-22 MED ORDER — SODIUM CHLORIDE 0.9 % IV SOLN
2.0000 g | INTRAVENOUS | Status: DC
  Administered 2021-02-22 – 2021-02-25 (×4): 2 g via INTRAVENOUS
  Filled 2021-02-22 (×4): qty 20

## 2021-02-22 MED ORDER — SODIUM CHLORIDE 0.9 % IV SOLN
500.0000 mg | INTRAVENOUS | Status: DC
  Administered 2021-02-23 – 2021-02-24 (×2): 500 mg via INTRAVENOUS
  Filled 2021-02-22 (×3): qty 500

## 2021-02-22 MED ORDER — FLUCONAZOLE 100 MG PO TABS
200.0000 mg | ORAL_TABLET | Freq: Every day | ORAL | Status: DC
  Administered 2021-02-22: 200 mg via ORAL
  Filled 2021-02-22: qty 1
  Filled 2021-02-22: qty 2

## 2021-02-22 MED ORDER — POLYETHYLENE GLYCOL 3350 17 G PO PACK
17.0000 g | PACK | Freq: Every day | ORAL | Status: DC
  Administered 2021-02-22: 17 g via ORAL
  Filled 2021-02-22: qty 1

## 2021-02-22 MED ORDER — HEPARIN BOLUS VIA INFUSION
4000.0000 [IU] | Freq: Once | INTRAVENOUS | Status: DC
  Filled 2021-02-22: qty 4000

## 2021-02-22 MED ORDER — SODIUM CHLORIDE 0.9 % IV SOLN: 500.0000 mg | INTRAVENOUS | Status: DC

## 2021-02-22 MED ORDER — LACTATED RINGERS IV BOLUS
1000.0000 mL | Freq: Once | INTRAVENOUS | Status: AC
  Administered 2021-02-22: 1000 mL via INTRAVENOUS

## 2021-02-22 MED ORDER — SENNOSIDES-DOCUSATE SODIUM 8.6-50 MG PO TABS
1.0000 | ORAL_TABLET | Freq: Once | ORAL | Status: AC
  Administered 2021-02-22: 1 via ORAL
  Filled 2021-02-22: qty 1

## 2021-02-22 NOTE — Plan of Care (Signed)
  Problem: Education: Goal: Knowledge of General Education information will improve Description: Including pain rating scale, medication(s)/side effects and non-pharmacologic comfort measures Outcome: Not Progressing   Problem: Health Behavior/Discharge Planning: Goal: Ability to manage health-related needs will improve Outcome: Not Progressing   Problem: Clinical Measurements: Goal: Ability to maintain clinical measurements within normal limits will improve Outcome: Not Progressing Goal: Will remain free from infection Outcome: Not Progressing Goal: Diagnostic test results will improve Outcome: Not Progressing Goal: Respiratory complications will improve Outcome: Not Progressing Goal: Cardiovascular complication will be avoided Outcome: Not Progressing   Problem: Activity: Goal: Risk for activity intolerance will decrease Outcome: Not Progressing   Problem: Nutrition: Goal: Adequate nutrition will be maintained Outcome: Not Progressing   Problem: Pain Managment: Goal: General experience of comfort will improve Outcome: Not Progressing   Problem: Safety: Goal: Ability to remain free from injury will improve Outcome: Not Progressing   Problem: Skin Integrity: Goal: Risk for impaired skin integrity will decrease Outcome: Not Progressing

## 2021-02-22 NOTE — Progress Notes (Addendum)
ANTICOAGULATION CONSULT NOTE  Pharmacy Consult for heparin > enoxaparin + warfarin  Indication: pulmonary embolus  Allergies  Allergen Reactions   Penicillins Hives and Itching    Has patient had a PCN reaction causing immediate rash, facial/tongue/throat swelling, SOB or lightheadedness with hypotension: Yes Has patient had a PCN reaction causing severe rash involving mucus membranes or skin necrosis: No Has patient had a PCN reaction that required hospitalization No Has patient had a PCN reaction occurring within the last 10 years: No If all of the above answers are "NO", then may proceed with Cephalosporin use.    Patient Measurements: Height: 5\' 10"  (177.8 cm) Weight: 77.1 kg (170 lb) IBW/kg (Calculated) : 73 Heparin Dosing Weight: 77.1 kg  Vital Signs: Temp: 98.5 F (36.9 C) (09/16 0913) Temp Source: Oral (09/16 0913) BP: 103/59 (09/16 0913) Pulse Rate: 76 (09/16 0913)  Labs: Recent Labs    02/21/21 0618 02/21/21 1255 02/21/21 1807 02/22/21 0200 02/22/21 0407 02/22/21 0558 02/22/21 1208  HGB 14.4  --   --   --  11.2* 11.8*  --   HCT 43.8  --   --   --  34.0* 36.1*  --   PLT 241  --   --   --  122* 141*  --   APTT  --  34  --   --   --   --   --   LABPROT  --  16.5*  --   --   --   --   --   INR  --  1.3*  --   --   --   --   --   HEPARINUNFRC  --   --    < > 0.14*  --  0.10* 0.31  CREATININE 1.16  --   --   --   --   --   --    < > = values in this interval not displayed.     Estimated Creatinine Clearance: 83 mL/min (by C-G formula based on SCr of 1.16 mg/dL).   Medical History: Past Medical History:  Diagnosis Date   HIV disease (HCC)    Known health problems: none     Medications:  Infusions:   cefTRIAXone (ROCEPHIN)  IV 2 g (02/22/21 1530)   heparin 1,700 Units/hr (02/22/21 0916)    Assessment: 45 yo male presenting with persistent cough found to have new segmental PE to R lower lobe on CTA. No AC PTA. Pharmacy consulted for heparin  dosing.  Plan to stop heparin infusion and transition to enoxaparin for warfarin. Given Symtuza, avoiding use with Xarelto and apixaban due to increase in anticoagulant effect. Hgb 11.8, plt 141. No s/sx of bleeding.   Of note, on daily Bactrim for PJP ppx - can increase INR - will dose conservatively.   Goal of Therapy:  Heparin level 0.3-0.7 units/ml Monitor platelets by anticoagulation protocol: Yes   Plan:  Stop heparin infusion  Start enoxaparin 77 mg (1 mg/kg) every 12 hours  Start warfarin 5 mg tonight  Monitor for s/sx of bleeding, enox levels as appropriate  54, PharmD, BCCCP Clinical Pharmacist  Phone: [phone] 02/22/2021 3:54 PM  Please check AMION for all [facility] phone numbers After 10:00 PM, call Main Pharmacy [phone]

## 2021-02-22 NOTE — Progress Notes (Addendum)
Subjective: Night team was paged to bedside for hypotension with MAPs in the high 50s at 2045.  Patient was given 1L fluid bolus.  On exam he was mentating well and resting comfortably.  Patient had just received dose of IV pain medication for back pain.  He endorsed feeling dizzy at that time and reports improvement in symptoms.  Maps improved with fluid bolus.  Dilaudid dose was decreased.  Was seen and examined on rounds with Spanish interpreter present for interpretation.  Patient endorses back pain that acutely worsened yesterday while he was lying down.  His back pain improves for 3 hours on pain medication.  He endorses fevers chills.  He is having lower chest upper abdominal pain.  His shortness of breath has improved and he is no longer on O2 supplementation.  Denies nausea vomiting.  He does feel bloated, stating he has not had a bowel movement in 2 to 3 days.  Objective:  Vital signs in last 24 hours: Vitals:   02/22/21 0800 02/22/21 0813 02/22/21 0840 02/22/21 0913  BP: 108/78  98/65 (!) 103/59  Pulse: 65  77 76  Resp:    18  Temp:  98.5 F (36.9 C)  98.5 F (36.9 C)  TempSrc:  Oral  Oral  SpO2: 100%  98% 100%  Weight:      Height:       Physical Exam: General: Well appearing Hispanic male, NAD HENT: normocephalic, atraumatic EYES: conjunctiva non-erythematous, no scleral icterus CV: regular rate, normal rhythm, no murmurs, rubs, gallops. Pulmonary: sating well on RA, significant rhonchi right lung, close bilateral lung bases, transmitted upper airway sounds that improve with cough Abdominal: non-distended, soft, tenderness to palpation epigastric area, normal BS Skin: Warm and dry, no rashes or lesions Neurological: MS: awake, alert and oriented x3, normal speech and fund of knowledge Motor: moves all extremities antigravity Psych: normal affect  Blood culture (routine x 2) [[id number]] Collected: 02/21/21 0610  Specimen: Blood Updated: 02/22/21 0846   Specimen  Description BLOOD LEFT ANTECUBITAL   Special Requests BOTTLES DRAWN AEROBIC AND ANAEROBIC Blood Culture adequate volume   Culture --   NO GROWTH 1 DAY  Performed at [facility], [address], [place], [place] [address]    Report Status PENDING  Blood culture (routine x 2) [[id number]] Collected: 02/21/21 0615  Specimen: Blood Updated: 02/22/21 0846   Specimen Description BLOOD RIGHT ANTECUBITAL   Special Requests BOTTLES DRAWN AEROBIC AND ANAEROBIC Blood Culture adequate volume   Culture --   NO GROWTH 1 DAY  Performed at [facility], [address] [place], [place] [address]    Report Status PENDING   T-helper cells (CD4) count (not at [facility]) [[id number]] (Abnormal) Collected: 02/21/21 0618  Specimen: Blood from Vein Updated: 02/21/21 1219   CD4 T Cell Abs <35 Low  /uL    CD4 % Helper T Cell 4 Low  %     Assessment/Plan:  Active Problems:   Community acquired pneumonia  Jon Love is a 45 y.o. with pertinent PMH of HIV/AIDS (CD4 count undetectable) who presented with back pain and admited for community acquired pneumonia and pulmonary embolism on hospital day 1  #RLL bronchopneumonia  #RLL segmental PE  Patient presented with two weeks of productive cough, shortness of breath, subjective fevers/chills, night sweats and weight loss. He is intermittently compliant with his antiretroviral regimen. He was noted to have right lower lower pulmonary embolus with superimposed bronchopneumonia on imaging.  Chest/upper abdominal  pain likely secondary to bronchopneumonia/segmental PE. -Day 2 of antibiotics: Currently on azithromycin, rocephin -D/C fluconazole, completed 14-day course for oral thrush started 9/2 -D/C vancomycin -Urine testing for atypical microbes -F/U AFB smear  and acid fast culture -F/u expectorated sputum culture -F/u blood cultures, NGTD -Lovenox, bridging to warfarin -Airborne precautions  #HIV/AIDS  Patient with history of HIV/AIDS  diagnosed 2020. His CD4 count has been undetectable since diagnosis. He has a history of medication nonadherence; however reports that he has been taking his symtuza since recent office visit. Last office visit on 9/02 with Dr. Staci Righter.  Also attended office visit on 3/03 with Jeanine Luz. - Continue Symtuza 1 tablet daily  - Continue Bactrim 1 tablet daily  -Monitoring for IRIS  #Back pain MRI of thoracic and lumbar spine without acute infectious etiology noted. Exam is more consistent with musculoskeletal vs referred etiology.  - Flexeril 5mg  tid prn -Diluadid 0.5mg  q6hr PRN  #Constipation Patient endorses bloating, has not had a bowel movement in 2 to 3 days. -Bowel regimen  #Alcohol Abuse No concerns for withdrawal. -Thiamine tablet daily -Multivitamin daily  Diet: Heart VTE: Lovenox IVF: None Code: Full   Prior to Admission Living Arrangement: Home Anticipated Discharge Location: Home Barriers to Discharge: Pending clinical improvement Dispo: Anticipated discharge in approximately 2-3 day(s).   , MD 02/22/2021, 3:32 PM Pager: [phone] After 5pm on weekdays and 1pm on weekends: On Call pager [phone]

## 2021-02-22 NOTE — ED Notes (Signed)
Notified receiving RN for 2W06 that VS are updated, pertinent information shared via secure chat and SBAR is ready for review.

## 2021-02-22 NOTE — Progress Notes (Signed)
ANTICOAGULATION CONSULT NOTE - Follow Up Consult  Pharmacy Consult for heparin Indication: new pulmonary embolus  Labs: Recent Labs    02/21/21 0618 02/21/21 1255 02/21/21 1807 02/22/21 0200 02/22/21 0407 02/22/21 0558  HGB 14.4  --   --   --  11.2* 11.8*  HCT 43.8  --   --   --  34.0* 36.1*  PLT 241  --   --   --  122* 141*  APTT  --  34  --   --   --   --   LABPROT  --  16.5*  --   --   --   --   INR  --  1.3*  --   --   --   --   HEPARINUNFRC  --   --  0.25* 0.14*  --  0.10*  CREATININE 1.16  --   --   --   --   --    Heparin dosing weight: 77.1 kg  Assessment: 45yo male subtherapeutic on heparin for new RLL segmental PE.  No PTA anticoagulation noted. Pt has continued to be subtherapeutic despite increases in infusion.  No s/s of bleeding noted. MD felt hgb and plts decrease 2/2 to fluids received. No issues with infusion. Currently infusing at 1450 units/mL.  Heparin level: 9/16 0.10, subtherapeutic   Goal of Therapy:  Heparin level 0.3-0.7 units/ml   Plan:  Bolus with heparin 2250 units and increase heparin infusion by 3 units/kg/hr to 1700 units/hr and check level in 6 hours.    Thank you for allowing pharmacy to participate in this patient's care.  Marja Kays, PharmD PGY1 Acute Care Resident  02/22/2021,7:28 AM

## 2021-02-22 NOTE — Progress Notes (Addendum)
ANTICOAGULATION CONSULT NOTE - Follow Up Consult  Pharmacy Consult for heparin Indication: pulmonary embolus  Labs: Recent Labs    02/21/21 0618 02/21/21 1255 02/21/21 1807 02/22/21 0200  HGB 14.4  --   --   --   HCT 43.8  --   --   --   PLT 241  --   --   --   APTT  --  34  --   --   LABPROT  --  16.5*  --   --   INR  --  1.3*  --   --   HEPARINUNFRC  --   --  0.25* 0.14*  CREATININE 1.16  --   --   --     Assessment: 45yo male subtherapeutic on heparin with lower level despite increased rate; no infusion issues or signs of bleeding per RN.  Goal of Therapy:  Heparin level 0.3-0.7 units/ml   Plan:  Will rebolus with heparin 4000 units and increase heparin infusion by 3 units/kg/hr to 1700 units/hr and check level in 6 hours.    Vernard Gambles, PharmD, BCPS  02/22/2021,5:02 AM   Addendum: CBC resulted after coags showing substantial decrease in WBC, Hgb, and Plt; IMTS MD would like to confirm validity of results prior to changing heparin infusion.  Will also recheck heparin level.  VB 5:44 AM

## 2021-02-22 NOTE — ED Notes (Signed)
Per receiving RN for 2W06. Okay to transport pt to inpatient bed at this time.

## 2021-02-23 LAB — BASIC METABOLIC PANEL
Anion gap: 12 (ref 5–15)
BUN: 9 mg/dL (ref 6–20)
CO2: 23 mmol/L (ref 22–32)
Calcium: 8.1 mg/dL — ABNORMAL LOW (ref 8.9–10.3)
Chloride: 98 mmol/L (ref 98–111)
Creatinine, Ser: 0.86 mg/dL (ref 0.61–1.24)
GFR, Estimated: >60 mL/min (ref >60)
Glucose, Bld: 128 mg/dL — ABNORMAL HIGH (ref 70–99)
Potassium: 3.1 mmol/L — ABNORMAL LOW (ref 3.5–5.1)
Sodium: 133 mmol/L — ABNORMAL LOW (ref 135–145)

## 2021-02-23 LAB — PROTIME-INR
INR: 1.2 (ref 0.8–1.2)
Prothrombin Time: 14.7 seconds (ref 11.4–15.2)

## 2021-02-23 LAB — CBC
HCT: 34.1 % — ABNORMAL LOW (ref 39.0–52.0)
Hemoglobin: 11.5 g/dL — ABNORMAL LOW (ref 13.0–17.0)
MCH: 27.4 pg (ref 26.0–34.0)
MCHC: 33.7 g/dL (ref 30.0–36.0)
MCV: 81.4 fL (ref 80.0–100.0)
Platelets: 146 10*3/uL — ABNORMAL LOW (ref 150–400)
RBC: 4.19 MIL/uL — ABNORMAL LOW (ref 4.22–5.81)
RDW: 14 % (ref 11.5–15.5)
WBC: 9.4 10*3/uL (ref 4.0–10.5)
nRBC: 0 % (ref 0.0–0.2)

## 2021-02-23 LAB — HIV-1 RNA QUANT-NO REFLEX-BLD
HIV 1 RNA Quant: 660 copies/mL
LOG10 HIV-1 RNA: 2.82 log10copy/mL

## 2021-02-23 MED ORDER — WARFARIN SODIUM 2.5 MG PO TABS
2.5000 mg | ORAL_TABLET | Freq: Once | ORAL | Status: AC
  Administered 2021-02-23: 2.5 mg via ORAL
  Filled 2021-02-23: qty 1

## 2021-02-23 MED ORDER — DICLOFENAC SODIUM 1 % EX GEL
2.0000 g | Freq: Four times a day (QID) | CUTANEOUS | Status: DC
  Administered 2021-02-23 – 2021-02-26 (×9): 2 g via TOPICAL
  Filled 2021-02-23 (×2): qty 100

## 2021-02-23 MED ORDER — POTASSIUM CHLORIDE 10 MEQ/100ML IV SOLN
10.0000 meq | INTRAVENOUS | Status: AC
  Administered 2021-02-23 (×2): 10 meq via INTRAVENOUS
  Filled 2021-02-23: qty 100

## 2021-02-23 MED ORDER — SENNOSIDES-DOCUSATE SODIUM 8.6-50 MG PO TABS
1.0000 | ORAL_TABLET | Freq: Every day | ORAL | Status: AC
  Administered 2021-02-25: 1 via ORAL
  Filled 2021-02-23: qty 1

## 2021-02-23 MED ORDER — POTASSIUM CHLORIDE 20 MEQ PO PACK
40.0000 meq | PACK | Freq: Two times a day (BID) | ORAL | Status: DC
  Administered 2021-02-23 – 2021-02-26 (×7): 40 meq via ORAL
  Filled 2021-02-23 (×7): qty 2

## 2021-02-23 MED ORDER — POLYETHYLENE GLYCOL 3350 17 G PO PACK
17.0000 g | PACK | Freq: Two times a day (BID) | ORAL | Status: DC
  Administered 2021-02-24 – 2021-02-26 (×2): 17 g via ORAL
  Filled 2021-02-23 (×4): qty 1

## 2021-02-23 MED ORDER — DAPSONE 100 MG PO TABS
100.0000 mg | ORAL_TABLET | Freq: Every day | ORAL | Status: DC
  Administered 2021-02-23 – 2021-02-25 (×3): 100 mg via ORAL
  Filled 2021-02-23 (×3): qty 1

## 2021-02-23 NOTE — Hospital Course (Signed)
No needed any oxygen.   Had a headache.   Breathing is better. No sputum. Dry cough sometimes.   Still hurting in chest. Pain has improved on antibiotics.   Walking to restroom.   Abd pain in RUQ and LUQ  Knees are hurting. Has this at home. Similar pain to at home. No effusion no overlying erytnema of skin.

## 2021-02-23 NOTE — Progress Notes (Signed)
ANTICOAGULATION CONSULT NOTE  Pharmacy Consult for heparin > enoxaparin + warfarin  Indication: pulmonary embolus  Allergies  Allergen Reactions   Penicillins Hives and Itching    Has patient had a PCN reaction causing immediate rash, facial/tongue/throat swelling, SOB or lightheadedness with hypotension: Yes Has patient had a PCN reaction causing severe rash involving mucus membranes or skin necrosis: No Has patient had a PCN reaction that required hospitalization No Has patient had a PCN reaction occurring within the last 10 years: No If all of the above answers are "NO", then may proceed with Cephalosporin use.    Patient Measurements: Height: 5\' 10"  (177.8 cm) Weight: 77.1 kg (169 lb 15.6 oz) IBW/kg (Calculated) : 73 Heparin Dosing Weight: 77.1 kg  Vital Signs: Temp: 98.9 F (37.2 C) (09/17 0615) Temp Source: Oral (09/17 0615) BP: 95/62 (09/17 07-25-1983) Pulse Rate: 87 (09/17 0615)  Labs: Recent Labs    02/21/21 0618 02/21/21 1255 02/21/21 1807 02/22/21 0200 02/22/21 0407 02/22/21 0558 02/22/21 1208 02/23/21 0139  HGB 14.4  --   --   --  11.2* 11.8*  --  11.5*  HCT 43.8  --   --   --  34.0* 36.1*  --  34.1*  PLT 241  --   --   --  122* 141*  --  146*  APTT  --  34  --   --   --   --   --   --   LABPROT  --  16.5*  --   --   --   --   --   --   INR  --  1.3*  --   --   --   --   --   --   HEPARINUNFRC  --   --    < > 0.14*  --  0.10* 0.31  --   CREATININE 1.16  --   --   --   --   --   --  0.86   < > = values in this interval not displayed.     Estimated Creatinine Clearance: 112 mL/min (by C-G formula based on SCr of 0.86 mg/dL).   Medical History: Past Medical History:  Diagnosis Date   HIV disease (HCC)    Known health problems: none     Medications:  Infusions:   azithromycin     cefTRIAXone (ROCEPHIN)  IV 2 g (02/22/21 1530)   potassium chloride      Assessment: 45 yo male presenting with persistent cough found to have new segmental PE to R  lower lobe on CTA. No AC PTA. Pharmacy consulted for heparin dosing. Plan to stop heparin infusion and transition to  warfarin with a enoxaparin bridge. Given PTA Symtuza, avoiding use with Xarelto and apixaban due to increase in anticoagulant effect.   9/16 Warfarin 2.5mg  - dosed conservatively with bactrim for proph and recent fluconazole.  Hgb 11.5 g/dL, Plt 10/16, No s/s of bleeding noted.  Goal of Therapy:  INR goal: 2-3 Monitor platelets by anticoagulation protocol: Yes   Plan:  Warfarin 2.5mg  again today Continue enoxaparin 77 mg (1 mg/kg) every 12 hours for 5 day bridge or until is INR therapeutic  Recommendation to switch bactrim to dapsone for proph  Monitor for s/sx of bleeding, daily INR, and enox levels as appropriate  Thank you for allowing pharmacy to participate in this patient's care.  195, PharmD PGY1 Acute Care Resident  02/23/2021,7:58 AM

## 2021-02-23 NOTE — Progress Notes (Signed)
Subjective: No acute events or concerns overnight.  Patient evaluated at bedside.  He reports improvement in back pain, upper abdominal/lower chest pain. He did not require O2 supplementation overnight and denies shortness of breath. He has a cough but no sputum production.  He endorses headache this morning and encouraged to ask for as needed medications. He has not yet had a bowel movement.  He has no additional complaints.  Objective:  Vital signs in last 24 hours: Vitals:   02/22/21 2152 02/23/21 0426 02/23/21 0615 02/23/21 0642  BP: 100/61   95/62  Pulse: (!) 102  87   Resp: 18  16   Temp: 100.2 F (37.9 C)  98.9 F (37.2 C)   TempSrc: Oral  Oral   SpO2: 95%  97%   Weight:  77.1 kg    Height:       Physical Exam: General: Well appearing Hispanic male, NAD HENT: normocephalic, atraumatic EYES: conjunctiva non-erythematous, no scleral icterus CV: regular rate, normal rhythm, no murmurs, rubs, gallops, no LEE Pulmonary: sating well on RA, improved rhonchi right lung, crackles bilateral lung bases, transmitted upper airway sounds that improve with cough Abdominal: non-distended, soft, mild tenderness to palpation epigastric area/ RUQ, normal BS Skin: Warm and dry, no rashes or lesions Neurological: MS: awake, alert and oriented x3, normal speech and fund of knowledge Motor: moves all extremities antigravity Psych: normal affect  Blood cultures: NGTD Urine cultures: NGTD Strep pneumo urine antigen: Negative Legionella urine antigen: Pending   Assessment/Plan:  Active Problems:   Transaminitis   Community acquired pneumonia   Pulmonary embolism (HCC)  Jon Love is a 45 y.o. with pertinent PMH of HIV/AIDS (CD4 count undetectable) who presented with back pain and admited for community acquired pneumonia and pulmonary embolism on hospital day 2  #RLL bronchopneumonia  #RLL segmental PE  Patient presented with two weeks of productive cough, shortness of  breath, subjective fevers/chills, night sweats and weight loss.  CTA showed right lower lower pulmonary embolus with superimposed bronchopneumonia on imaging.  Chest/upper abdominal pain likely secondary to bronchopneumonia/segmental PE, improving.  Breathing comfortably on room air. -Day 3 of antibiotics: Currently on azithromycin, rocephin -F/U Legionella urine antigen -F/U AFB smear and acid fast culture, sputum culture (no sputum sample yet) -F/u blood cultures and urine cultures, NGTD -Lovenox, bridging to warfarin (interaction between Eliquis and Symtuza, consider Pradaxa) -Airborne precautions, TB rule out  #HIV/AIDS  Patient with history of HIV/AIDS diagnosed 2020. His CD4 count has been undetectable since diagnosis. He has a history of medication nonadherence; however reports that he has been taking his symtuza since recent office visit. Last office visit on 9/02 with Dr. Staci Righter.  Also attended office visit on 3/03 with Jeanine Luz. - Continue Symtuza 1 tablet daily  -Switched Bactrim to dapsone 100 mg daily, interaction between Bactrim and warfarin - Monitoring for signs of IRIS  #Back pain MRI of thoracic and lumbar spine without acute infectious etiology noted. Exam is more consistent with musculoskeletal vs referred etiology. Improving. - Flexeril 5mg  tid prn -Diluadid 0.5mg  q6hr PRN  #Constipation Patient endorses bloating, has not had a bowel movement in 2 to 3 days. -Scheduled MiraLAX twice daily, scheduled Senokot daily  #Alcohol Abuse No concerns for withdrawal. -Thiamine tablet daily -Multivitamin daily  Diet: Heart VTE: Lovenox bridge + warfarin IVF: None Code: Full  Prior to Admission Living Arrangement: Home Anticipated Discharge Location: Home Barriers to Discharge: Pending clinical improvement Dispo: Anticipated discharge in approximately 2-3 day(s).  Ellison Carwin, MD 02/23/2021, 7:26 AM Pager: [phone] After 5pm on weekdays and 1pm on  weekends: On Call pager [phone]

## 2021-02-24 DIAGNOSIS — R7401 Elevation of levels of liver transaminase levels: Secondary | ICD-10-CM

## 2021-02-24 DIAGNOSIS — J189 Pneumonia, unspecified organism: Secondary | ICD-10-CM

## 2021-02-24 LAB — FERRITIN: Ferritin: 971 ng/mL — ABNORMAL HIGH (ref 24–336)

## 2021-02-24 LAB — CBC
HCT: 37.2 % — ABNORMAL LOW (ref 39.0–52.0)
Hemoglobin: 12.6 g/dL — ABNORMAL LOW (ref 13.0–17.0)
MCH: 27.6 pg (ref 26.0–34.0)
MCHC: 33.9 g/dL (ref 30.0–36.0)
MCV: 81.6 fL (ref 80.0–100.0)
Platelets: 129 10*3/uL — ABNORMAL LOW (ref 150–400)
RBC: 4.56 MIL/uL (ref 4.22–5.81)
RDW: 14 % (ref 11.5–15.5)
WBC: 5.1 10*3/uL (ref 4.0–10.5)
nRBC: 0 % (ref 0.0–0.2)

## 2021-02-24 LAB — COMPREHENSIVE METABOLIC PANEL
ALT: 30 U/L (ref 0–44)
AST: 20 U/L (ref 15–41)
Albumin: 2.7 g/dL — ABNORMAL LOW (ref 3.5–5.0)
Alkaline Phosphatase: 75 U/L (ref 38–126)
Anion gap: 8 (ref 5–15)
BUN: 8 mg/dL (ref 6–20)
CO2: 24 mmol/L (ref 22–32)
Calcium: 8.2 mg/dL — ABNORMAL LOW (ref 8.9–10.3)
Chloride: 101 mmol/L (ref 98–111)
Creatinine, Ser: 0.7 mg/dL (ref 0.61–1.24)
GFR, Estimated: >60 mL/min (ref >60)
Glucose, Bld: 91 mg/dL (ref 70–99)
Potassium: 4.1 mmol/L (ref 3.5–5.1)
Sodium: 133 mmol/L — ABNORMAL LOW (ref 135–145)
Total Bilirubin: 0.9 mg/dL (ref 0.3–1.2)
Total Protein: 5.2 g/dL — ABNORMAL LOW (ref 6.5–8.1)

## 2021-02-24 LAB — PROTIME-INR
INR: 1.1 (ref 0.8–1.2)
Prothrombin Time: 13.9 seconds (ref 11.4–15.2)

## 2021-02-24 LAB — DIFFERENTIAL
Abs Immature Granulocytes: 0.1 10*3/uL — ABNORMAL HIGH (ref 0.00–0.07)
Basophils Absolute: 0 10*3/uL (ref 0.0–0.1)
Basophils Relative: 0 %
Eosinophils Absolute: 0.8 10*3/uL — ABNORMAL HIGH (ref 0.0–0.5)
Eosinophils Relative: 15 %
Lymphocytes Relative: 10 %
Lymphs Abs: 0.5 10*3/uL — ABNORMAL LOW (ref 0.7–4.0)
Metamyelocytes Relative: 1 %
Monocytes Absolute: 0.1 10*3/uL (ref 0.1–1.0)
Monocytes Relative: 2 %
Neutro Abs: 3.7 10*3/uL (ref 1.7–7.7)
Neutrophils Relative %: 72 %
nRBC: 0 /100 WBC

## 2021-02-24 LAB — IRON AND TIBC
Iron: 45 ug/dL (ref 45–182)
Saturation Ratios: 16 % — ABNORMAL LOW (ref 17.9–39.5)
TIBC: 288 ug/dL (ref 250–450)
UIBC: 243 ug/dL

## 2021-02-24 MED ORDER — HYDROMORPHONE HCL 1 MG/ML IJ SOLN: 0.5000 mg | Freq: Four times a day (QID) | INTRAMUSCULAR | Status: DC | PRN

## 2021-02-24 MED ORDER — LIDOCAINE VISCOUS HCL 2 % MT SOLN
15.0000 mL | Freq: Every day | OROMUCOSAL | Status: DC
  Administered 2021-02-24 – 2021-02-26 (×3): 15 mL via OROMUCOSAL
  Filled 2021-02-24 (×3): qty 15

## 2021-02-24 MED ORDER — WARFARIN SODIUM 5 MG PO TABS
5.0000 mg | ORAL_TABLET | Freq: Once | ORAL | Status: AC
  Administered 2021-02-24: 5 mg via ORAL
  Filled 2021-02-24: qty 1

## 2021-02-24 MED ORDER — BENZOCAINE 10 % MT GEL
Freq: Three times a day (TID) | OROMUCOSAL | Status: DC | PRN
  Filled 2021-02-24: qty 9

## 2021-02-24 MED ORDER — OXYCODONE HCL 5 MG PO TABS
5.0000 mg | ORAL_TABLET | Freq: Four times a day (QID) | ORAL | Status: DC | PRN
  Administered 2021-02-24: 5 mg via ORAL
  Filled 2021-02-24 (×2): qty 1

## 2021-02-24 NOTE — Evaluation (Signed)
Physical Therapy Evaluation Patient Details Name: Jon Love MRN: [id number] DOB: [birth year] Today's Date: 02/24/2021  History of Present Illness  Pt adm 9/15 with PNA and PE. PMH - HIV/AIDS, etoh  Clinical Impression  Pt independent with mobility and no further PT needed.  Pt on RA with SpO2 97% with activity. Ready for dc from PT standpoint.        Recommendations for follow up therapy are one component of a multi-disciplinary discharge planning process, led by the attending physician.  Recommendations may be updated based on patient status, additional functional criteria and insurance authorization.  Follow Up Recommendations No PT follow up    Equipment Recommendations  None recommended by PT    Recommendations for Other Services       Precautions / Restrictions Precautions Precautions: None      Mobility  Bed Mobility Overal bed mobility: Independent                  Transfers Overall transfer level: Independent Equipment used: None                Ambulation/Gait Ambulation/Gait assistance: Independent Gait Distance (Feet): 450 Feet Assistive device: None Gait Pattern/deviations: WFL(Within Functional Limits) Gait velocity: normal Gait velocity interpretation: >4.37 ft/sec, indicative of normal walking speed General Gait Details: Steady gait. Amb on RA with SpO2 97%  Stairs            Wheelchair Mobility    Modified Rankin (Stroke Patients Only)       Balance Overall balance assessment: No apparent balance deficits (not formally assessed)                                           Pertinent Vitals/Pain Pain Assessment: Faces Faces Pain Scale: Hurts a little bit Pain Location: bliser on his tongue Pain Descriptors / Indicators: Burning Pain Intervention(s): Other (comment) (nurse aware)    Home Living Family/patient expects to be discharged to:: Private residence     Type of Home: Apartment          Home Equipment: None      Prior Function Level of Independence: Independent         Comments: works in Psychologist, counselling        Extremity/Trunk Assessment   Upper Extremity Assessment Upper Extremity Assessment: Overall WFL for tasks assessed    Lower Extremity Assessment Lower Extremity Assessment: Overall WFL for tasks assessed       Communication   Communication: Prefers language other than [place];Interpreter utilized Tax adviser interpreter [place] [phone])  Cognition Arousal/Alertness: Awake/alert Behavior During Therapy: WFL for tasks assessed/performed Overall Cognitive Status: Within Functional Limits for tasks assessed                                        General Comments      Exercises     Assessment/Plan    PT Assessment Patent does not need any further PT services  PT Problem List         PT Treatment Interventions      PT Goals (Current goals can be found in the Care Plan section)  Acute Rehab PT Goals PT Goal Formulation: All assessment and education complete, DC therapy    Frequency  Barriers to discharge        Co-evaluation               AM-PAC PT "6 Clicks" Mobility  Outcome Measure Help needed turning from your back to your side while in a flat bed without using bedrails?: None Help needed moving from lying on your back to sitting on the side of a flat bed without using bedrails?: None Help needed moving to and from a bed to a chair (including a wheelchair)?: None Help needed standing up from a chair using your arms (e.g., wheelchair or bedside chair)?: None Help needed to walk in hospital room?: None Help needed climbing 3-5 steps with a railing? : None 6 Click Score: 24    End of Session   Activity Tolerance: Patient tolerated treatment well Patient left: in bed;with call bell/phone within reach Nurse Communication: Mobility status PT Visit Diagnosis: Other abnormalities of  gait and mobility (R26.89)    Time: 1146-1200 PT Time Calculation (min) (ACUTE ONLY): 14 min   Charges:   PT Evaluation $PT Eval Low Complexity: 1 Low          [facility] PT Acute Rehabilitation Services Pager [phone] Office [phone]   Angelina Ok [facility] 02/24/2021, 1:35 PM

## 2021-02-24 NOTE — Progress Notes (Signed)
ANTICOAGULATION CONSULT NOTE  Pharmacy Consult for heparin > enoxaparin + warfarin  Indication: pulmonary embolus  Allergies  Allergen Reactions   Penicillins Hives and Itching    Has patient had a PCN reaction causing immediate rash, facial/tongue/throat swelling, SOB or lightheadedness with hypotension: Yes Has patient had a PCN reaction causing severe rash involving mucus membranes or skin necrosis: No Has patient had a PCN reaction that required hospitalization No Has patient had a PCN reaction occurring within the last 10 years: No If all of the above answers are "NO", then may proceed with Cephalosporin use.    Patient Measurements: Height: 5\' 10"  (177.8 cm) Weight: 77.1 kg (169 lb 15.6 oz) IBW/kg (Calculated) : 73 Heparin Dosing Weight: 77.1 kg  Vital Signs: Temp: 99.6 F (37.6 C) (09/18 0409) Temp Source: Oral (09/18 0409) BP: 106/71 (09/18 0409) Pulse Rate: 67 (09/18 0409)  Labs: Recent Labs    02/21/21 1255 02/21/21 1807 02/22/21 0200 02/22/21 0407 02/22/21 0558 02/22/21 1208 02/23/21 0139 02/23/21 0746 02/24/21 0131  HGB  --   --   --    < > 11.8*  --  11.5*  --  12.6*  HCT  --   --   --    < > 36.1*  --  34.1*  --  37.2*  PLT  --   --   --    < > 141*  --  146*  --  129*  APTT 34  --   --   --   --   --   --   --   --   LABPROT 16.5*  --   --   --   --   --   --  14.7 13.9  INR 1.3*  --   --   --   --   --   --  1.2 1.1  HEPARINUNFRC  --    < > 0.14*  --  0.10* 0.31  --   --   --   CREATININE  --   --   --   --   --   --  0.86  --  0.70   < > = values in this interval not displayed.     Estimated Creatinine Clearance: 120.4 mL/min (by C-G formula based on SCr of 0.7 mg/dL).   Medical History: Past Medical History:  Diagnosis Date   HIV disease (HCC)    Known health problems: none     Medications:  Infusions:   azithromycin 500 mg (02/23/21 1401)   cefTRIAXone (ROCEPHIN)  IV 2 g (02/23/21 1626)    Assessment: 45 yo male presenting with  persistent cough found to have new segmental PE to R lower lobe on CTA. No AC PTA. Pharmacy consulted for heparin dosing. Plan to stop heparin infusion and transition to  warfarin with a enoxaparin bridge. Given PTA Symtuza, avoiding use with Xarelto and apixaban due to increase in anticoagulant effect. On 9/17, switched bactrim to dapsone to avoid dosing interactions with warfarin.   Warfarin doses 9/16 Warfarin 2.5mg  9/17 Warfarin 2.5mg   Daily INR 9/17 1.2 9/18 1.1  Hgb 12.6 g/dL, Plt 10/18, No s/s of bleeding noted.  Goal of Therapy:  INR goal: 2-3 Monitor platelets by anticoagulation protocol: Yes   Plan:  Warfarin 5mg  today Continue enoxaparin 77 mg (1 mg/kg) every 12 hours for 5 day bridge or until is INR therapeutic  Monitor for potential drug interactions  Monitor for s/sx of bleeding, daily INR, and enox  levels as appropriate  Thank you for allowing pharmacy to participate in this patient's care.  Marja Kays, PharmD PGY1 Acute Care Resident  02/24/2021,7:21 AM

## 2021-02-24 NOTE — Progress Notes (Signed)
Subjective: No acute events or concerns overnight.  Patient evaluated at bedside.  He denies back pain and upper abdominal/chest pain. He did not require O2 supplementation overnight and denies shortness of breath. He has minimal cough but no sputum production.  He endorses headache this morning which improved with as needed meds.  1 BM in the last 24 hours. Objective:  Vital signs in last 24 hours: Vitals:   02/23/21 1207 02/24/21 0356 02/24/21 0409 02/24/21 1257  BP: 95/63  106/71 100/72  Pulse: 80  67 71  Resp: 18  18 19   Temp: 97.8 F (36.6 C)  99.6 F (37.6 C) 97.7 F (36.5 C)  TempSrc: Oral  Oral Oral  SpO2: 94%  95% 96%  Weight:  77.1 kg    Height:       Physical Exam: General: Well appearing Hispanic male, NAD HENT: normocephalic, atraumatic, new lesions on lip with serous crust, additional lesion on tongue EYES: conjunctiva non-erythematous, no scleral icterus CV: regular rate, normal rhythm, no murmurs, rubs, gallops, no LEE Pulmonary: sating well on RA, improved rhonchi right lung, crackles bilateral lung bases, transmitted upper airway sounds that improve with cough Abdominal: non-distended, soft, tenderness to palpation, normal BS Skin: Warm and dry, no rashes or lesions Neurological: MS: awake, alert and oriented x3, normal speech and fund of knowledge Motor: moves all extremities antigravity Psych: normal affect  Blood cultures: NGTD Urine cultures: NGTD Strep pneumo urine antigen: Negative Legionella urine antigen: Pending   Assessment/Plan:  Active Problems:   Transaminitis   Community acquired pneumonia   Pulmonary embolism (HCC)  Jon Love is a 45 y.o. with pertinent PMH of HIV/AIDS (CD4 count undetectable) who presented with back pain and admited for community acquired pneumonia and pulmonary embolism on hospital day 3  #RLL bronchopneumonia  #RLL segmental PE  Patient presented with two weeks of productive cough, shortness of  breath, subjective fevers/chills, night sweats and weight loss. CTA showed right lower lower pulmonary embolus with superimposed bronchopneumonia on imaging. Breathing comfortably on room air. -Day 3 of antibiotics: Currently on azithromycin, rocephin -F/U Legionella urine antigen -F/U AFB smear and acid fast culture, sputum culture (no sputum sample yet) -F/u blood cultures and urine cultures, NGTD -Lovenox, bridging to warfarin (interaction between Eliquis and Symtuza, consider Pradaxa) -Airborne precautions, TB rule out  #HIV/AIDS  Patient with history of HIV/AIDS diagnosed 2020. His CD4 count has been undetectable since diagnosis. He has a history of medication nonadherence; however reports that he has been taking his symtuza since recent office visit. Last office visit on 9/02 with Dr. 11/02.  Also attended office visit on 3/03 with 5/03.  On admission CD4 count less than 35, HIV RNA quant 660. - Continue Symtuza 1 tablet daily  -Switched Bactrim to dapsone 100 mg daily, interaction between Bactrim and warfarin - Monitoring for signs of IRIS  #New Lip/Tongue Lesions On 9/18 patient had painful lesions on lip with serous crusting as well as possible lesion/ulcer on tongue tip.  This is in the setting of recent CAP, intermittent adherence to HIV antiretroviral therapy, multiple recent changes in medication, recent thrush infection status posttreatment, differential includes HSV. -HSV swab lip/tongue -Lidocaine mouthwash, Orajel  #Back pain MRI of thoracic and lumbar spine without acute infectious etiology noted. Exam is more consistent with musculoskeletal vs referred etiology. Improving. - Flexeril 5mg  tid prn -Diluadid 0.5mg  q6hr PRN  #Constipation, resolved Patient endorses bloating, has not had a bowel movement in 2 to 3 days. -  Scheduled MiraLAX twice daily, scheduled Senokot daily  #Alcohol Abuse No concerns for withdrawal. -Thiamine tablet daily -Multivitamin  daily  Diet: Heart VTE: Lovenox bridge + warfarin IVF: None Code: Full  Prior to Admission Living Arrangement: Home Anticipated Discharge Location: Home Barriers to Discharge: Pending clinical improvement Dispo: Anticipated discharge in approximately 1 day(s).   Ellison Carwin, MD 02/24/2021, 1:58 PM Pager: [phone] After 5pm on weekdays and 1pm on weekends: On Call pager [phone]

## 2021-02-25 ENCOUNTER — Other Ambulatory Visit (HOSPITAL_COMMUNITY): Payer: Self-pay

## 2021-02-25 LAB — PROTIME-INR
INR: 1.1 (ref 0.8–1.2)
Prothrombin Time: 14.3 seconds (ref 11.4–15.2)

## 2021-02-25 LAB — CBC
HCT: 39.5 % (ref 39.0–52.0)
Hemoglobin: 13.1 g/dL (ref 13.0–17.0)
MCH: 27.3 pg (ref 26.0–34.0)
MCHC: 33.2 g/dL (ref 30.0–36.0)
MCV: 82.3 fL (ref 80.0–100.0)
Platelets: 148 10*3/uL — ABNORMAL LOW (ref 150–400)
RBC: 4.8 MIL/uL (ref 4.22–5.81)
RDW: 14.1 % (ref 11.5–15.5)
WBC: 7.2 10*3/uL (ref 4.0–10.5)
nRBC: 0 % (ref 0.0–0.2)

## 2021-02-25 LAB — BASIC METABOLIC PANEL
Anion gap: 10 (ref 5–15)
BUN: 6 mg/dL (ref 6–20)
CO2: 24 mmol/L (ref 22–32)
Calcium: 8.5 mg/dL — ABNORMAL LOW (ref 8.9–10.3)
Chloride: 96 mmol/L — ABNORMAL LOW (ref 98–111)
Creatinine, Ser: 0.77 mg/dL (ref 0.61–1.24)
GFR, Estimated: >60 mL/min (ref >60)
Glucose, Bld: 91 mg/dL (ref 70–99)
Potassium: 4.2 mmol/L (ref 3.5–5.1)
Sodium: 130 mmol/L — ABNORMAL LOW (ref 135–145)

## 2021-02-25 LAB — LEGIONELLA PNEUMOPHILA SEROGP 1 UR AG: L. pneumophila Serogp 1 Ur Ag: NEGATIVE

## 2021-02-25 MED ORDER — FLUCONAZOLE 100 MG PO TABS: 200.0000 mg | ORAL_TABLET | Freq: Every day | ORAL | Status: DC

## 2021-02-25 MED ORDER — FLUCONAZOLE 100 MG PO TABS: 400.0000 mg | ORAL_TABLET | Freq: Every day | ORAL | Status: DC

## 2021-02-25 MED ORDER — FLUCONAZOLE 100 MG PO TABS
200.0000 mg | ORAL_TABLET | Freq: Every day | ORAL | Status: DC
  Administered 2021-02-26: 200 mg via ORAL
  Filled 2021-02-25: qty 2

## 2021-02-25 MED ORDER — WARFARIN SODIUM 7.5 MG PO TABS: 7.5000 mg | ORAL_TABLET | Freq: Once | ORAL | Status: DC

## 2021-02-25 MED ORDER — BICTEGRAVIR-EMTRICITAB-TENOFOV 50-200-25 MG PO TABS
1.0000 | ORAL_TABLET | Freq: Every day | ORAL | Status: DC
  Administered 2021-02-26: 1 via ORAL
  Filled 2021-02-25: qty 1

## 2021-02-25 MED ORDER — SULFAMETHOXAZOLE-TRIMETHOPRIM 400-80 MG PO TABS
1.0000 | ORAL_TABLET | Freq: Every day | ORAL | Status: DC
  Administered 2021-02-25 – 2021-02-26 (×2): 1 via ORAL
  Filled 2021-02-25 (×2): qty 1

## 2021-02-25 MED ORDER — MAGIC MOUTHWASH W/LIDOCAINE: 5.0000 mL | Freq: Four times a day (QID) | ORAL | Status: DC | PRN

## 2021-02-25 MED ORDER — FLUCONAZOLE 100 MG PO TABS: 800.0000 mg | ORAL_TABLET | Freq: Every day | ORAL | Status: DC

## 2021-02-25 MED ORDER — APIXABAN 5 MG PO TABS
10.0000 mg | ORAL_TABLET | Freq: Two times a day (BID) | ORAL | Status: DC
  Administered 2021-02-25 – 2021-02-26 (×2): 10 mg via ORAL
  Filled 2021-02-25 (×3): qty 2

## 2021-02-25 MED ORDER — FLUCONAZOLE 100 MG PO TABS
400.0000 mg | ORAL_TABLET | Freq: Every day | ORAL | Status: AC
  Administered 2021-02-25: 400 mg via ORAL
  Filled 2021-02-25: qty 4

## 2021-02-25 MED ORDER — APIXABAN 5 MG PO TABS: 5.0000 mg | ORAL_TABLET | Freq: Two times a day (BID) | ORAL | Status: DC

## 2021-02-25 NOTE — Progress Notes (Signed)
Subjective: No acute events or concerns overnight.  Patient evaluated at bedside.  He denies musculoskeletal pain.  Intermittently having lower chest/upper abdominal pain.  Denies shortness of breath, has an occasional cough without sputum production.  Denies fevers nausea constipation.  Continues to endorse burning pain on lower lip as well as tip of tongue and side of tongue.  Objective:  Vital signs in last 24 hours: Vitals:   02/24/21 0409 02/24/21 1257 02/24/21 1950 02/25/21 0403  BP: 106/71 100/72 107/79 99/65  Pulse: 67 71 82 69  Resp: 18 19 18 16   Temp: 99.6 F (37.6 C) 97.7 F (36.5 C) 99 F (37.2 C) 98.6 F (37 C)  TempSrc: Oral Oral Oral Oral  SpO2: 95% 96% 94% 94%  Weight:    69.7 kg  Height:       Physical Exam: General: Well appearing Hispanic male, NAD HENT: normocephalic, atraumatic, lesions on lip with serous crust, additional lesion on tongue, not vesicular, erythematous EYES: conjunctiva non-erythematous, no scleral icterus CV: regular rate, normal rhythm, no murmurs, rubs, gallops, no LEE Pulmonary: sating well on RA, improved rhonchi right lung, crackles bilateral lung bases, transmitted upper airway sounds that improve with cough Abdominal: non-distended, soft, tenderness to palpation, normal BS Skin: Warm and dry, no rashes or lesions Neurological: MS: awake, alert and oriented x3, normal speech and fund of knowledge Motor: moves all extremities antigravity Psych: normal affect  Blood cultures: NGTD Urine cultures: NGTD Strep pneumo urine antigen: Negative Legionella urine antigen: Pending HSV swab lip/tongue: Pending   Assessment/Plan:  Active Problems:   Transaminitis   Community acquired pneumonia   Pulmonary embolism (HCC)  Jon Love is a 45 y.o. with pertinent PMH of HIV/AIDS (CD4 count undetectable) who presented with back pain and admited for community acquired pneumonia and pulmonary embolism on hospital day 4  #RLL  bronchopneumonia  #RLL segmental PE  Patient presented with two weeks of productive cough, shortness of breath, subjective fevers/chills, night sweats and weight loss. CTA showed right lower lower pulmonary embolus with superimposed bronchopneumonia on imaging. Breathing comfortably on room air. -Day 4 of antibiotics: Currently on rocephin, discontinued azithromycin on 9/19 -F/U Legionella urine antigen -F/u blood cultures and urine cultures, NGTD -Given change in HIV med to [facility] we can D/C warfarin and Lovenox -Eliquis for anticoagulation   #HIV/AIDS  Patient with history of HIV/AIDS diagnosed 2020. His CD4 count has been undetectable since diagnosis. He has a history of medication nonadherence; however reports that he has been taking his symtuza since recent office visit. Last office visit on 9/02 with Dr. 11/02.  Also attended office visit on 3/03 with 5/03.  On admission CD4 count less than 35, HIV RNA quant 660. -Switched from Jeanine Luz to [place] 9/19 (less medication interactions than Symtuza) -Bactrim PPx -Monitoring for signs of IRIS   #New Lip/Tongue Lesions #Possible Thrush On 9/18 patient had painful lesions on lip with serous crusting as well as possible lesion/ulcer on tongue tip.  This is in the setting of recent CAP, intermittent adherence to HIV antiretroviral therapy, multiple recent changes in medication, recent thrush infection status posttreatment though unclear if all doses were taken, differential includes HSV vs thrush.  Given burning pain, erythema, and lack of vesicles on exam, thrush more likely. -HSV swab lip/tongue pending -Lidocaine mouthwash -Fluconazole loading dose 400 mg, followed by 200 mg daily -Nystatin combination mouthwash (Magic mouthwash with lidocaine)  #Back pain MRI of thoracic and lumbar spine without acute infectious etiology noted.  Exam is more consistent with musculoskeletal vs referred etiology. Improving. - Flexeril 5mg   tid prn -Diluadid 0.5mg  q6hr PRN  #Constipation, resolved Patient endorses bloating, has not had a bowel movement in 2 to 3 days. -Scheduled MiraLAX twice daily, scheduled Senokot daily  #Alcohol Abuse No concerns for withdrawal. -Thiamine tablet daily -Multivitamin daily  Diet: Heart VTE: Eliquis IVF: None Code: Full  Prior to Admission Living Arrangement: Home Anticipated Discharge Location: Home Barriers to Discharge: Pending clinical improvement Dispo: Anticipated discharge in approximately 1 day(s).   , MD 02/25/2021, 1:56 PM Pager: [phone] After 5pm on weekdays and 1pm on weekends: On Call pager [phone]

## 2021-02-25 NOTE — TOC Benefit Eligibility Note (Signed)
Patient Advocate Encounter  Insurance verification completed.    The patient is uninsured  Monroe Toure, CPhT Pharmacy Patient Advocate Specialist  Antimicrobial Stewardship Team Direct Number: [phone]  Fax: [phone]        

## 2021-02-25 NOTE — Progress Notes (Signed)
ANTICOAGULATION CONSULT NOTE  Pharmacy Consult for apixaban  Indication: pulmonary embolus  Allergies  Allergen Reactions   Penicillins Hives and Itching    Has patient had a PCN reaction causing immediate rash, facial/tongue/throat swelling, SOB or lightheadedness with hypotension: Yes Has patient had a PCN reaction causing severe rash involving mucus membranes or skin necrosis: No Has patient had a PCN reaction that required hospitalization No Has patient had a PCN reaction occurring within the last 10 years: No If all of the above answers are "NO", then may proceed with Cephalosporin use.    Patient Measurements: Height: 5\' 10"  (177.8 cm) Weight: 69.7 kg (153 lb 10.6 oz) IBW/kg (Calculated) : 73 Heparin Dosing Weight: 77.1 kg  Vital Signs: Temp: 98.6 F (37 C) (09/19 0403) Temp Source: Oral (09/19 0403) BP: 99/65 (09/19 0403) Pulse Rate: 69 (09/19 0403)  Labs: Recent Labs    02/23/21 0139 02/23/21 0746 02/24/21 0131 02/25/21 0406  HGB 11.5*  --  12.6* 13.1  HCT 34.1*  --  37.2* 39.5  PLT 146*  --  129* 148*  LABPROT  --  14.7 13.9 14.3  INR  --  1.2 1.1 1.1  CREATININE 0.86  --  0.70 0.77     Estimated Creatinine Clearance: 115 mL/min (by C-G formula based on SCr of 0.77 mg/dL).   Medical History: Past Medical History:  Diagnosis Date   HIV disease (HCC)    Known health problems: none     Medications:  Infusions:   cefTRIAXone (ROCEPHIN)  IV 2 g (02/24/21 1816)    Assessment: 45 yo male presenting with persistent cough found to have new segmental PE to R lower lobe on CTA. No anticoagulation noted PTA. Pharmacy initially consulted for heparin dosing. Patient has since been transitioned to treatment dose enoxaparin bridge to warfarin therapy due to multiple drug interactions with HIV meds.  Pharmacy consulted to switch to apixaban for PE treatment on 9/19.  Team has changed HIV meds removing concern for DDI with apixaban.  Goal of Therapy:   Therapeutic anticoagulation Monitor platelets by anticoagulation protocol: Yes   Plan:  Apixaban 10mg  PO BID x 7 days, then Apixaban 5mg  BOD thereafter. Rx to complete patient education and copay check.  Thank you for allowing pharmacy to participate in this patient's care.  10/19, Pharm.D., BCPS Clinical Pharmacist Clinical phone for 02/25/2021 from 7:30-3:00 is [phone]  **Pharmacist phone directory can be found on [email or web address] listed under [facility].  02/25/2021 2:10 PM

## 2021-02-25 NOTE — Progress Notes (Signed)
ANTICOAGULATION CONSULT NOTE  Pharmacy Consult for heparin > enoxaparin + warfarin  Indication: pulmonary embolus  Allergies  Allergen Reactions   Penicillins Hives and Itching    Has patient had a PCN reaction causing immediate rash, facial/tongue/throat swelling, SOB or lightheadedness with hypotension: Yes Has patient had a PCN reaction causing severe rash involving mucus membranes or skin necrosis: No Has patient had a PCN reaction that required hospitalization No Has patient had a PCN reaction occurring within the last 10 years: No If all of the above answers are "NO", then may proceed with Cephalosporin use.    Patient Measurements: Height: 5\' 10"  (177.8 cm) Weight: 69.7 kg (153 lb 10.6 oz) IBW/kg (Calculated) : 73 Heparin Dosing Weight: 77.1 kg  Vital Signs: Temp: 98.6 F (37 C) (09/19 0403) Temp Source: Oral (09/19 0403) BP: 99/65 (09/19 0403) Pulse Rate: 69 (09/19 0403)  Labs: Recent Labs    02/22/21 1208 02/23/21 0139 02/23/21 0139 02/23/21 0746 02/24/21 0131 02/25/21 0406  HGB  --  11.5*   < >  --  12.6* 13.1  HCT  --  34.1*  --   --  37.2* 39.5  PLT  --  146*  --   --  129* 148*  LABPROT  --   --   --  14.7 13.9 14.3  INR  --   --   --  1.2 1.1 1.1  HEPARINUNFRC 0.31  --   --   --   --   --   CREATININE  --  0.86  --   --  0.70 0.77   < > = values in this interval not displayed.     Estimated Creatinine Clearance: 115 mL/min (by C-G formula based on SCr of 0.77 mg/dL).   Medical History: Past Medical History:  Diagnosis Date   HIV disease (HCC)    Known health problems: none     Medications:  Infusions:   azithromycin 500 mg (02/24/21 1430)   cefTRIAXone (ROCEPHIN)  IV 2 g (02/24/21 1816)    Assessment: 45 yo male presenting with persistent cough found to have new segmental PE to R lower lobe on CTA. No anticoagulation noted PTA. Pharmacy initially consulted for heparin dosing. Patient has since been transitioned to treatment dose  enoxaparin bridge to warfarin therapy.  INR has been slow to respond despite potential DDI which would increase INR.  Will increase warfarin dose today.  Today is day #4 of overlap therapy.  Will need to continue bridge until INR >2 x 24 hours.  Given PTA Symtuza, avoiding use of rivaroxaban and apixaban due to DDI. On 9/17, switched bactrim to dapsone to avoid DDI with warfarin.   Warfarin doses 9/16 Warfarin 2.5mg  9/17 Warfarin 2.5mg  9/18 Warfarin 5mg   Daily INR 9/17 1.2 9/18 1.1 9/19 1.1  Goal of Therapy:  INR goal: 2-3 Monitor platelets by anticoagulation protocol: Yes   Plan:  Warfarin 7.5mg  today Continue enoxaparin 77 mg (1 mg/kg) every 12 hours for 5 day bridge or until is INR therapeutic  Monitor for potential drug interactions  Monitor for s/sx of bleeding, daily INR, and enox levels as appropriate  Thank you for allowing pharmacy to participate in this patient's care.  10/18, Pharm.D., BCPS Clinical Pharmacist Clinical phone for 02/25/2021 from 7:30-3:00 is [phone]  **Pharmacist phone directory can be found on [email or web address] listed under [facility].  02/25/2021 8:20 AM

## 2021-02-25 NOTE — Progress Notes (Signed)
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  Integrase Mutations None   Interpretation of Genotype Data per [employer] HIV Database Nucleoside RTIs  abacavir (ABC) Low-Level Resistance zidovudine (AZT) Susceptible emtricitabine (FTC) High-Level Resistance lamivudine (3TC) High-Level Resistance tenofovir (TDF) Susceptible   Non-Nucleoside RTIs  doravirine (DOR) Susceptible efavirenz (EFV) Susceptible etravirine (ETR) Susceptible nevirapine (NVP) Susceptible rilpivirine (RPV) Susceptible   Protease Inhibitors  atazanavir/r (ATV/r) Susceptible darunavir/r (DRV/r) Susceptible lopinavir/r (LPV/r) Susceptible      Sharin Mons, PharmD, BCPS, BCIDP Infectious Diseases Clinical Pharmacist Phone: [phone] 02/25/2021

## 2021-02-25 NOTE — Plan of Care (Signed)

## 2021-02-26 ENCOUNTER — Other Ambulatory Visit (HOSPITAL_COMMUNITY): Payer: Self-pay

## 2021-02-26 LAB — PATHOLOGIST SMEAR REVIEW

## 2021-02-26 LAB — CULTURE, BLOOD (ROUTINE X 2)
Culture: NO GROWTH
Culture: NO GROWTH
Special Requests: ADEQUATE
Special Requests: ADEQUATE

## 2021-02-26 LAB — BASIC METABOLIC PANEL
Anion gap: 10 (ref 5–15)
BUN: 8 mg/dL (ref 6–20)
CO2: 22 mmol/L (ref 22–32)
Calcium: 8.8 mg/dL — ABNORMAL LOW (ref 8.9–10.3)
Chloride: 96 mmol/L — ABNORMAL LOW (ref 98–111)
Creatinine, Ser: 0.72 mg/dL (ref 0.61–1.24)
GFR, Estimated: >60 mL/min (ref >60)
Glucose, Bld: 100 mg/dL — ABNORMAL HIGH (ref 70–99)
Potassium: 4.3 mmol/L (ref 3.5–5.1)
Sodium: 128 mmol/L — ABNORMAL LOW (ref 135–145)

## 2021-02-26 LAB — CBC
HCT: 41.4 % (ref 39.0–52.0)
Hemoglobin: 13.9 g/dL (ref 13.0–17.0)
MCH: 27.2 pg (ref 26.0–34.0)
MCHC: 33.6 g/dL (ref 30.0–36.0)
MCV: 81 fL (ref 80.0–100.0)
Platelets: 148 10*3/uL — ABNORMAL LOW (ref 150–400)
RBC: 5.11 MIL/uL (ref 4.22–5.81)
RDW: 13.9 % (ref 11.5–15.5)
WBC: 10.1 10*3/uL (ref 4.0–10.5)
nRBC: 0 % (ref 0.0–0.2)

## 2021-02-26 MED ORDER — ACYCLOVIR 400 MG PO TABS
400.0000 mg | ORAL_TABLET | Freq: Three times a day (TID) | ORAL | Status: DC
  Administered 2021-02-26: 400 mg via ORAL
  Filled 2021-02-26 (×3): qty 1

## 2021-02-26 MED ORDER — APIXABAN 5 MG PO TABS: 5.0000 mg | ORAL_TABLET | Freq: Two times a day (BID) | ORAL | 0 refills | Status: AC

## 2021-02-26 MED ORDER — FOLIC ACID 1 MG PO TABS: 1.0000 mg | ORAL_TABLET | Freq: Every day | ORAL | 0 refills | Status: DC

## 2021-02-26 MED ORDER — FLUCONAZOLE 200 MG PO TABS: 200.0000 mg | ORAL_TABLET | Freq: Every day | ORAL | 0 refills | Status: AC

## 2021-02-26 MED ORDER — ACYCLOVIR 400 MG PO TABS: 400.0000 mg | ORAL_TABLET | Freq: Three times a day (TID) | ORAL | 0 refills | Status: AC

## 2021-02-26 MED ORDER — BICTEGRAVIR-EMTRICITAB-TENOFOV 50-200-25 MG PO TABS: 1.0000 | ORAL_TABLET | Freq: Every day | ORAL | 0 refills | Status: AC

## 2021-02-26 MED ORDER — APIXABAN 5 MG PO TABS: 10.0000 mg | ORAL_TABLET | Freq: Two times a day (BID) | ORAL | 0 refills | Status: AC

## 2021-02-26 NOTE — Discharge Instructions (Addendum)
Fue hospitalizado por neumona y cogulos de sangre en el pulmn. Gracias por permitirnos ser parte de su cuidado.  Asegrese de asistir a su cita de seguimiento con Emilio Aspen y su mdico de atencin primaria.  Tenga en cuenta estos cambios realizados en sus medicamentos:  Por favor COMIENCE a tomar:  Radio producer una tableta diaria todos los das (VIH) Aciclovir una tableta x3 por da durante 10 das (llagas en la boca: HSV) Fluconazol 200 mg una vez al da durante 12 das (ms llagas: aftas) Eliquis segn su lista de medicamentos (cogulos de [place])   Por favor, DEJE de tomar: Marketing executive

## 2021-02-26 NOTE — Discharge Summary (Signed)
Name: Phil Michels MRN: [id number] DOB: 02-21-1976 45 y.o. PCP: Patient, No Pcp Per (Inactive)  Date of Admission: 02/21/2021  5:43 AM Date of Discharge: 02/26/21 Attending Physician: Inez Catalina, MD  Discharge Diagnosis: 1.  Bronchopneumonia 2.  Segmental pulmonary embolism  Discharge Medications: Allergies as of 02/26/2021       Reactions   Penicillins Hives, Itching   Has patient had a PCN reaction causing immediate rash, facial/tongue/throat swelling, SOB or lightheadedness with hypotension: Yes Has patient had a PCN reaction causing severe rash involving mucus membranes or skin necrosis: No Has patient had a PCN reaction that required hospitalization No Has patient had a PCN reaction occurring within the last 10 years: No If all of the above answers are "NO", then may proceed with Cephalosporin use.        Medication List     STOP taking these medications    Darunavir-Cobicistat-Emtricitabine-Tenofovir Alafenamide 800-150-200-10 MG Tabs Commonly known as: SYMTUZA       TAKE these medications    acyclovir 400 MG tablet Commonly known as: ZOVIRAX Take 1 tablet (400 mg total) by mouth 3 (three) times daily for 10 days.   apixaban 5 MG Tabs tablet Commonly known as: ELIQUIS Take 2 tablets (10 mg total) by mouth 2 (two) times daily for 5 days.   apixaban 5 MG Tabs tablet Commonly known as: ELIQUIS Take 1 tablet (5 mg total) by mouth 2 (two) times daily. Start taking on: March 04, 2021   bictegravir-emtricitabine-tenofovir AF 50-200-25 MG Tabs tablet Commonly known as: BIKTARVY Take 1 tablet by mouth daily. Start taking on: February 27, 2021   fluconazole 200 MG tablet Commonly known as: DIFLUCAN Take 1 tablet (200 mg total) by mouth daily for 12 days.   omeprazole 20 MG capsule Commonly known as: PRILOSEC Take 1 capsule (20 mg total) by mouth daily. Take 2 hours or more after Symtuza.  Spanish   sulfamethoxazole-trimethoprim 400-80  MG tablet Commonly known as: BACTRIM Take 1 tablet by mouth daily.        Disposition and follow-up:   Mr.Gennie Kristofor Michel Santee was discharged from [facility] in Stable condition.  At the hospital follow up visit please address:  1.  Bronchopneumonia: Patient has completed 5-day course of antibiotics. 2.  Segmental pulmonary embolism: Patient was started on Eliquis for treatment of segmental pulmonary embolism secondary to local infection, and will need to continue this medication at discharge for 6 months for provoked PE. 3. 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  Patient was started on 14-day course of fluconazole, end date 10/2.  Patient was also started on 10-day course of acyclovir, end date 9/30.   2.  Labs / imaging needed at time of follow-up: None  3.  Pending labs/ test needing follow-up: HSV swabs  Follow-up Appointments:  Follow-up Information     [facility] Follow up on 03/27/2021.   Specialty: Family Medicine Why: Please attend your appointment with Gwinda Passe, FNP, on Wednesday, 03/27/21, at 930am.  This appointment is very important for you to get an additional supply of your medication. Contact information: [address] [place] [address] [phone]        [facility]             . Schedule an appointment as soon as possible for a visit in 1 week(s).   Contact information: [address] [employer]  [address] [facility] [place] [address]                Hospital Course by problem list:  [facility] Michel Santee is a 45 y.o. with pertinent PMH of HIV/AIDS (CD4 count undetectable) who presented with back pain and admited for community acquired pneumonia and pulmonary embolism on hospital day 4   #RLL bronchopneumonia  #RLL segmental PE  Patient presented with two weeks of productive cough, shortness of  breath, subjective fevers/chills, night sweats and weight loss. CTA showed right lower lower pulmonary embolus with superimposed bronchopneumonia on imaging.  PE likely secondary to local infection. Patient was started on vancomycin, azithromycin, cefepime.  Low suspicion for MRSA, Mycobacterium therefore, vancomycin and azithromycin were discontinued.  He was narrowed to ceftriaxone.  Blood cultures and urine cultures with no growth to date.  Patient was initially started heparin then switched to Lovenox with bridge to warfarin, however by switching Symtuza to [place] we run into less interactions between medications and are able to discharge the patient on Eliquis. On day of discharge he is breathing comfortably on room air.  Patient will discharge on Eliquis 10 mg twice daily to reach a total 7 days followed by Eliquis 5 mg twice daily to treat provoked pulmonary embolism, for total duration 6 months.   #HIV/AIDS  Patient with history of HIV/AIDS diagnosed 2020. His CD4 count has been undetectable since diagnosis. He has a history of medication nonadherence; however reports that he has been taking his Symtuza since recent office visit. Last office visit on 9/02 with Dr. Staci Righter.  Also attended office visit on 3/03 with Jeanine Luz.  On admission CD4 count less than 35, HIV RNA quant 660.  During admission patient was switched from [facility] to [place] since there are less medication interactions with Biktarvy and medication such as Eliquis and fluconazole.  Patient will continue Biktarvy and Bactrim PPx at discharge.    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  This is in the setting of recent CAP, intermittent adherence to HIV antiretroviral therapy, multiple recent changes in medication, recent thrush infection status posttreatment though unclear if all doses were taken, differential includes HSV vs  thrush.  Patient describes burning pain on lip and tongue that is preventing him from being able to eat.  HSV swab of lip and tongue were ordered and results are still in process.  Patient was started on fluconazole loading dose 400 mg followed by 200 mg daily for total 14 days of treatment.  Patient will discharge on 12 more days of fluconazole.  Patient was also started on acyclovir for 10-day course.  Patient will need to follow-up with infectious disease to verify resolution of new lesions.   #Back pain, resolved MRI of thoracic and lumbar spine without acute infectious etiology noted. Exam is more consistent with musculoskeletal vs referred etiology.  Patient was given Flexeril 5 mg 3 times a day as needed as well as Dilaudid 0.25 mg every 6 hours as needed.  Patient's back pain resolved during admission.   #Constipation, resolved Patient endorsed bloating, stated has not had a bowel movement in 2 to 3 days.  Patient was started on scheduled MiraLAX twice daily as well as scheduled senokot and is having regular bowel movements at discharge.   #Alcohol Abuse There were no concerns for alcohol withdrawal throughout the admission.  Patient was started on multivitamin, thiamine, folic  acid.  No further medical management required  Subjective on day of discharge: No acute events or concerns overnight.  Patient reports feeling well this morning.  He denies shortness of breath or upper abdominal/lower chest pain.  He denies back pain.  He still has intermittent cough without sputum production.  He asked if his pneumonia and blood clots have resolved.  We discussed that he is completed a full antibiotic course for pneumonia and has been started on a blood thinner to treat his blood clots which she will need to take for several months.  He is amendable for discharge today.  Discharge Exam:   BP 103/72 (BP Location: Left Arm)   Pulse 81   Temp 97.8 F (36.6 C) (Axillary)   Resp 15   Ht 5\' 10"  (1.778 m)    Wt 69.7 kg   SpO2 94%   BMI 22.05 kg/m  Physical Exam: General: Well appearing Hispanic male, NAD HENT: normocephalic, atraumatic, lesions on lip with serous crust, additional lesion on tongue, non vesicular, erythematous EYES: conjunctiva non-erythematous, no scleral icterus CV: regular rate, normal rhythm, no murmurs, rubs, gallops, no LEE Pulmonary: sating well on RA, improved rhonchi right lung, transmitted upper airway sounds that improve with cough Abdominal: non-distended, soft, no tenderness to palpation, normal BS Skin: Warm and dry, no rashes or lesions Neurological: MS: awake, alert and oriented x3, normal speech and fund of knowledge Motor: moves all extremities antigravity Psych: normal affect  Pertinent Labs, Studies, and Procedures:  CBC Latest Ref Rng & Units 02/26/2021 02/25/2021 02/24/2021  WBC 4.0 - 10.5 K/uL 10.1 7.2 5.1  Hemoglobin 13.0 - 17.0 g/dL 02/26/2021 72.0 12.6(L)  Hematocrit 39.0 - 52.0 % 41.4 39.5 37.2(L)  Platelets 150 - 400 K/uL 148(L) 148(L) 129(L)   BMP Latest Ref Rng & Units 02/26/2021 02/25/2021 02/24/2021  Glucose 70 - 99 mg/dL 02/26/2021) 91 91  BUN 6 - 20 mg/dL 8 6 8   Creatinine 0.61 - 1.24 mg/dL 096(G 8.36  BUN/Creat Ratio 6 - 22 (calc) - - -  Sodium 135 - 145 mmol/L 128(L) 130(L) 133(L)  Potassium 3.5 - 5.1 mmol/L 4.3 4.2 4.1  Chloride 98 - 111 mmol/L 96(L) 96(L) 101  CO2 22 - 32 mmol/L 22 24 24   Calcium 8.9 - 10.3 mg/dL 6.29) 4.76) )   Blood culture (routine x 2) 5.4(Y Collected: 02/21/21 0615  Specimen: Blood Updated: 02/26/21 0825   Specimen Description BLOOD RIGHT ANTECUBITAL   Special Requests BOTTLES DRAWN AEROBIC AND ANAEROBIC Blood Culture adequate volume   Culture --   NO GROWTH 5 DAYS  Performed at [facility], [address] [place], [facility] [address]    Report Status 02/26/2021 FINAL  Blood culture (routine x 2) [place] Collected: 02/21/21 0610  Specimen: Blood Updated: 02/26/21 0825   Specimen  Description BLOOD LEFT ANTECUBITAL   Special Requests BOTTLES DRAWN AEROBIC AND ANAEROBIC Blood Culture adequate volume   Culture --   NO GROWTH 5 DAYS  Performed at [facility], [address] [place], [facility] [address]    Report Status 02/26/2021 FINAL   Legionella Pneumophila Serogp 1 Ur Ag [place] Collected: 02/22/21 1703  Specimen: Urine Updated: 02/25/21 1436   L. pneumophila Serogp 1 Ur Ag Negative   Source of Sample URINE, RANDOM   Ferritin [[id number]] (Abnormal) Collected: 02/23/21 0746  Specimen: Blood Updated: 02/24/21 0727   Ferritin 971 High  ng/mL   Iron and TIBC [[id number]] (Abnormal) Collected: 02/23/21 0746  Specimen: Blood Updated:  02/24/21 0727   Iron 45 ug/dL    TIBC 161 ug/dL    Saturation Ratios 16 Low  %    UIBC 243 ug/dL    HIV-1 RNA quant-no reflex-bld [[id number]] Collected: 02/21/21 1807  Specimen: Blood from Vein Updated: 02/23/21 1436   HIV 1 RNA Quant 660 copies/mL    LOG10 HIV-1 RNA 2.820 log10copy/mL   DG Chest 2 View  Result Date: 02/21/2021 CLINICAL DATA:  Cough, low back pain.  HIV aids. EXAM: CHEST - 2 VIEW COMPARISON:  07/24/2020 FINDINGS: The heart size and mediastinal contours are within normal limits. Both lungs are clear. The visualized skeletal structures are unremarkable. Interval clearing of infiltrate in the right lung base. IMPRESSION: No active cardiopulmonary disease. Electronically Signed   By: Marlan Palau M.D.   On: 02/21/2021 07:21   DG Lumbar Spine Complete  Result Date: 02/21/2021 CLINICAL DATA:  Cough, back pain.  HIV aids. EXAM: LUMBAR SPINE - COMPLETE 4+ VIEW COMPARISON:  MRI lumbar spine 07/29/2020 FINDINGS: There is no evidence of lumbar spine fracture. Alignment is normal. Intervertebral disc spaces are maintained. IMPRESSION: Negative. Electronically Signed   By: Marlan Palau M.D.   On: 02/21/2021 07:22   CT Angio Chest PE W and/or Wo Contrast  Result Date: 02/21/2021 CLINICAL DATA:  Shortness of breath,  evaluate for pulmonary embolism EXAM: CT ANGIOGRAPHY CHEST WITH CONTRAST TECHNIQUE: Multidetector CT imaging of the chest was performed using the standard protocol during bolus administration of intravenous contrast. Multiplanar CT image reconstructions and MIPs were obtained to evaluate the vascular anatomy. CONTRAST:  40mL OMNIPAQUE IOHEXOL 350 MG/ML SOLN COMPARISON:  None. FINDINGS: Cardiovascular: Normal heart size. No pericardial effusion. Pulmonary artery filling defect within a segmental branch of the right lower lobe. Although right lower lobe pulmonary arteries are narrow and hypoenhancing relative to other vessels (likely due to chronic lung disease and shunting) filling defect appears convincing with discrete and tubular shape. Mediastinum/Nodes: Enlarged paratracheal, subcarinal, and bilateral hilar lymph nodes with a few coarse calcifications. Left paratracheal nodal tissue measures up to 11 mm in short axis and subcarinal node is 16 mm in short axis. Lungs/Pleura: Airway thickening with reticulonodular opacity in the right lower lobe. Cylindrical bronchiectasis in right middle and lower lobes. Thin walled irregular shape cyst in the superior segment right lower lobe measuring 18 mm, crossing the diaphragm where the wall is thicker and more irregular and measuring up to 22 mm in the poste. Rior right upper lobe. Upper Abdomen: Cholecystectomy. Musculoskeletal: No acute finding Review of the MIP images confirms the above findings. These results were called by telephone at the time of interpretation on 02/21/2021 at 11:57 am to provider [facility] , who verbally acknowledged these results. IMPRESSION: 1. Positive for segmental pulmonary embolism to the right lower lobe. 2. Bronchopneumonia in the right lower lobe. There is a background of generalized bronchitis and right middle/lower lobe cylindrical bronchiectasis 3. Right pulmonary irregular cyst spanning the major fissure, likely stable from 2020 as  permitted by extensive motion artifact on prior. 4. Mild mediastinal adenopathy which may be related to #2. There is also superimposed calcification suggesting chronic granulomatous disease. Electronically Signed   By: Tiburcio Pea M.D.   On: 02/21/2021 11:57   MR THORACIC SPINE W WO CONTRAST  Result Date: 02/21/2021 CLINICAL DATA:  Back pain EXAM: MRI THORACIC AND LUMBAR SPINE WITHOUT AND WITH CONTRAST TECHNIQUE: Multiplanar and multiecho pulse sequences of the thoracic and lumbar spine were obtained without and with intravenous contrast. CONTRAST:  7.49mL GADAVIST GADOBUTROL 1 MMOL/ML IV SOLN COMPARISON:  MRI lumbar spine 07/29/2020 FINDINGS: MRI THORACIC SPINE FINDINGS Alignment:  Normal Vertebrae: Normal bone marrow. Negative for fracture or mass. No evidence of spinal infection. Normal enhancement of the spine. Cord:  Normal signal and morphology.  No cord compression. Paraspinal and other soft tissues: Negative for paraspinous mass, adenopathy, or fluid collection. Disc levels: Small right-sided disc protrusion T2-3 without significant stenosis. Remaining disc spaces appear healthy. No significant stenosis elsewhere. MRI LUMBAR SPINE FINDINGS Segmentation:  Normal Alignment:  Normal Vertebrae: Normal bone marrow. Negative for fracture, mass, or spinal infection. Conus medullaris: Extends to the L1-2 level and appears normal. Paraspinal and other soft tissues: Negative for paraspinous soft tissue mass, adenopathy, or fluid collection. Disc levels: L1-2: Negative L2-3: Negative L3-4: Mild disc and mild facet degeneration. Disc bulging and facet hypertrophy. Subligamentous 2 mm synovial cyst on the left. Mild to moderate spinal stenosis. Moderate subarticular stenosis bilaterally. L4-5: Disc degeneration with disc space narrowing. Broad-based shallow disc protrusion with associated spurring. Bilateral facet hypertrophy. Mild to moderate spinal stenosis. Moderate subarticular stenosis bilaterally L5-S1: Mild  facet degeneration. Mild subarticular stenosis bilaterally. IMPRESSION: 1. Small right-sided disc protrusion at T2-3. Otherwise negative thoracic MRI 2. Disc and facet degeneration causing mild to moderate spinal stenosis L3-4 and L4-5. No change from the prior MRI 07/29/2020. Electronically Signed   By: Marlan Palau M.D.   On: 02/21/2021 10:27   MR Lumbar Spine W Wo Contrast  Result Date: 02/21/2021 CLINICAL DATA:  Back pain EXAM: MRI THORACIC AND LUMBAR SPINE WITHOUT AND WITH CONTRAST TECHNIQUE: Multiplanar and multiecho pulse sequences of the thoracic and lumbar spine were obtained without and with intravenous contrast. CONTRAST:  7.11mL GADAVIST GADOBUTROL 1 MMOL/ML IV SOLN COMPARISON:  MRI lumbar spine 07/29/2020 FINDINGS: MRI THORACIC SPINE FINDINGS Alignment:  Normal Vertebrae: Normal bone marrow. Negative for fracture or mass. No evidence of spinal infection. Normal enhancement of the spine. Cord:  Normal signal and morphology.  No cord compression. Paraspinal and other soft tissues: Negative for paraspinous mass, adenopathy, or fluid collection. Disc levels: Small right-sided disc protrusion T2-3 without significant stenosis. Remaining disc spaces appear healthy. No significant stenosis elsewhere. MRI LUMBAR SPINE FINDINGS Segmentation:  Normal Alignment:  Normal Vertebrae: Normal bone marrow. Negative for fracture, mass, or spinal infection. Conus medullaris: Extends to the L1-2 level and appears normal. Paraspinal and other soft tissues: Negative for paraspinous soft tissue mass, adenopathy, or fluid collection. Disc levels: L1-2: Negative L2-3: Negative L3-4: Mild disc and mild facet degeneration. Disc bulging and facet hypertrophy. Subligamentous 2 mm synovial cyst on the left. Mild to moderate spinal stenosis. Moderate subarticular stenosis bilaterally. L4-5: Disc degeneration with disc space narrowing. Broad-based shallow disc protrusion with associated spurring. Bilateral facet hypertrophy. Mild  to moderate spinal stenosis. Moderate subarticular stenosis bilaterally L5-S1: Mild facet degeneration. Mild subarticular stenosis bilaterally. IMPRESSION: 1. Small right-sided disc protrusion at T2-3. Otherwise negative thoracic MRI 2. Disc and facet degeneration causing mild to moderate spinal stenosis L3-4 and L4-5. No change from the prior MRI 07/29/2020. Electronically Signed   By: Marlan Palau M.D.   On: 02/21/2021 10:27   ECHOCARDIOGRAM COMPLETE  Result Date: 02/22/2021    ECHOCARDIOGRAM REPORT   Patient Name:   SHELTON SQUARE [facility] Date of Exam: 02/22/2021 Medical Rec #:  [id number]                Height:       70.0 in Accession #:    [id number]  Weight:       170.0 lb Date of Birth:  [birth year]                BSA:          1.948 m Patient Age:    45 years                 BP:           99/67 mmHg Patient Gender: M                        HR:           66 bpm. Exam Location:  Inpatient Procedure: 2D Echo, Cardiac Doppler and Color Doppler Indications:    PE  History:        Patient no has prior history of Echocardiogram examinations,                 most recent 02/21/2021 was of the abdomen.  Sonographer:    MH Referring Phys: [id number] CAROLYN GUILLOUD IMPRESSIONS  1. Left ventricular ejection fraction, by estimation, is 55 to 60%. The left ventricle has normal function. The left ventricle has no regional wall motion abnormalities. There is mild concentric left ventricular hypertrophy. Left ventricular diastolic parameters were normal.  2. Right ventricular systolic function is normal. The right ventricular size is normal. Tricuspid regurgitation signal is inadequate for assessing PA pressure.  3. The mitral valve is normal in structure. No evidence of mitral valve regurgitation. No evidence of mitral stenosis.  4. The aortic valve was not well visualized. Aortic valve regurgitation is not visualized. No aortic stenosis is present.  5. The inferior vena cava is normal in size with greater  than 50% respiratory variability, suggesting right atrial pressure of 3 mmHg. Comparison(s): No prior Echocardiogram. FINDINGS  Left Ventricle: Left ventricular ejection fraction, by estimation, is 55 to 60%. The left ventricle has normal function. The left ventricle has no regional wall motion abnormalities. The left ventricular internal cavity size was normal in size. There is  mild concentric left ventricular hypertrophy. Left ventricular diastolic parameters were normal. Right Ventricle: The right ventricular size is normal. No increase in right ventricular wall thickness. Right ventricular systolic function is normal. Tricuspid regurgitation signal is inadequate for assessing PA pressure. Left Atrium: Left atrial size was normal in size. Right Atrium: Right atrial size was normal in size. Pericardium: There is no evidence of pericardial effusion. Mitral Valve: The mitral valve is normal in structure. No evidence of mitral valve regurgitation. No evidence of mitral valve stenosis. Tricuspid Valve: The tricuspid valve is normal in structure. Tricuspid valve regurgitation is not demonstrated. No evidence of tricuspid stenosis. Aortic Valve: The aortic valve was not well visualized. Aortic valve regurgitation is not visualized. No aortic stenosis is present. Aortic valve mean gradient measures 3.0 mmHg. Aortic valve peak gradient measures 3.7 mmHg. Aortic valve area, by VTI measures 2.48 cm. Pulmonic Valve: The pulmonic valve was not well visualized. Pulmonic valve regurgitation is not visualized. No evidence of pulmonic stenosis. Aorta: The aortic root and ascending aorta are structurally normal, with no evidence of dilitation. Venous: The inferior vena cava is normal in size with greater than 50% respiratory variability, suggesting right atrial pressure of 3 mmHg. IAS/Shunts: The atrial septum is grossly normal.  LEFT VENTRICLE PLAX 2D LVIDd:         4.30 cm     Diastology LVIDs:  2.70 cm     LV e'  medial:    9.79 cm/s LV PW:         1.60 cm     LV E/e' medial:  6.9 LV IVS:        1.30 cm     LV e' lateral:   17.20 cm/s LVOT diam:     2.00 cm     LV E/e' lateral: 3.9 LV SV:         57 LV SV Index:   29 LVOT Area:     3.14 cm  LV Volumes (MOD) LV vol d, MOD A4C: 62.5 ml LV vol s, MOD A4C: 21.3 ml LV SV MOD A4C:     62.5 ml RIGHT VENTRICLE            IVC RV S prime:     8.49 cm/s  IVC diam: 1.80 cm TAPSE (M-mode): 2.1 cm LEFT ATRIUM           Index       RIGHT ATRIUM          Index LA diam:      3.00 cm 1.54 cm/m  RA Area:     9.78 cm LA Vol (A2C): 9.7 ml  4.97 ml/m  RA Volume:   14.90 ml 7.65 ml/m LA Vol (A4C): 33.9 ml 17.40 ml/m  AORTIC VALVE                   PULMONIC VALVE AV Area (Vmax):    3.05 cm    PV Vmax:       0.64 m/s AV Area (Vmean):   2.68 cm    PV Peak grad:  1.7 mmHg AV Area (VTI):     2.48 cm AV Vmax:           96.10 cm/s AV Vmean:          76.200 cm/s AV VTI:            0.228 m AV Peak Grad:      3.7 mmHg AV Mean Grad:      3.0 mmHg LVOT Vmax:         93.30 cm/s LVOT Vmean:        64.900 cm/s LVOT VTI:          0.180 m LVOT/AV VTI ratio: 0.79  AORTA Ao Root diam: 3.70 cm Ao Asc diam:  2.90 cm MITRAL VALVE MV Area (PHT): 2.39 cm    SHUNTS MV E velocity: 67.80 cm/s  Systemic VTI:  0.18 m MV A velocity: 48.10 cm/s  Systemic Diam: 2.00 cm MV E/A ratio:  1.41 Riley Lam MD Electronically signed by Riley Lam MD Signature Date/Time: 02/22/2021/2:15:37 PM    Final    US Abdomen Limited RUQ (LIVER/GB)  Result Date: 02/21/2021 CLINICAL DATA:  Transaminitis, history of cholecystectomy EXAM: ULTRASOUND ABDOMEN LIMITED RIGHT UPPER QUADRANT COMPARISON:  None. FINDINGS: Gallbladder: Surgically absent Common bile duct: Diameter: 7 mm, at the upper limits of normal but likely due to the history of cholecystectomy. Liver: Parenchymal echogenicity is diffusely increased. There are no focal lesions. Portal vein is patent on color Doppler imaging with normal direction of blood flow  towards the liver. Other: None. IMPRESSION: Increased parenchymal echogenicity of the liver suggesting moderate hepatic steatosis. No focal lesions or intrahepatic biliary ductal dilatation. Electronically Signed   By: Lesia Hausen M.D.   On: 02/21/2021 15:03     Discharge Instructions: Discharge Instructions  Call MD for:  difficulty breathing, headache or visual disturbances   Complete by: As directed    Call MD for:  persistant nausea and vomiting   Complete by: As directed    Call MD for:  redness, tenderness, or signs of infection (pain, swelling, redness, odor or green/yellow discharge around incision site)   Complete by: As directed    Call MD for:  temperature >100.4   Complete by: As directed        Signed: Ellison Carwin, MD 02/26/2021, 1:45 PM   Pager: [phone]

## 2021-02-26 NOTE — TOC Initial Note (Addendum)
Transition of Care [facility]) - Initial/Assessment Note    Patient Details  Name: Jon Love MRN: [id number] Date of Birth: 02-02-1976  [facility]) CM/SW Contact:    Joanne Chars, LCSW Phone Number: 02/26/2021, 1:11 PM  Clinical Narrative: CSW met with pt using Str[employer] wall-E for interpretation.  Discussed need for medication, discussed Match for initial 30 day supply, discussed need for PCP.  Pt confirms he does not have insurance or PCP, willing to follow up, verbalized understanding that he needs to see PCP for help with next supply of medication.  Pt did confirm that he goes to clinic for follow up for HIV.  Pt reports he has a car, can get self to appts, came to [facility] hospital by EMS and will need ride home.  Pt signed Public house manager.  [facility] scheduled pt for hospital follow up on 10/19, will be able to fill medicine at that time and will set up new pt appt at that time.  CSW spoke with MD regarding need for medicine.  She reports pt goes to [facility] on [address] as part of HNAP program, which covers his medications, including elliquis.  Free 30 day card given to pt as back up plan for elliquis.         1500: Uber ride set up through [employer] transportation            Expected Discharge Plan: Home/Self Care Barriers to Discharge: Inadequate or no insurance, Other (must enter comment) (No PCP)   Patient Goals and CMS Choice   CMS Medicare.gov Compare Post Acute Care list provided to::  (na)    Expected Discharge Plan and Services Expected Discharge Plan: Home/Self Care In-house Referral: Clinical Social Work     Living arrangements for the past 2 months: Single Family Home                 DME Arranged: N/A         HH Arranged: NA          Prior Living Arrangements/Services Living arrangements for the past 2 months: Single Family Home Lives with:: Self Patient language and need for interpreter reviewed::  Yes (Patent attorney used through [employer]) Do you feel safe going back to the place where you live?: Yes      Need for Family Participation in Patient Care: No (Comment) Care giver support system in place?: Yes (comment) Current home services: Other (comment) (na) Criminal Activity/Legal Involvement Pertinent to Current Situation/Hospitalization: No - Comment as needed  Activities of Daily Living      Permission Sought/Granted Permission sought to share information with : Family Supports Permission granted to share information with : Yes, Verbal Permission Granted  Share Information with NAME: Elzie Rings, friend, [phone]           Emotional Assessment Appearance:: Appears stated age Attitude/Demeanor/Rapport: Engaged Affect (typically observed): Appropriate, Pleasant Orientation: :  (not recorded, appears oriented) Alcohol / Substance Use: Not Applicable Psych Involvement: No (comment)  Admission diagnosis:  Transaminitis [R74.01] Community acquired pneumonia [J18.9] Pulmonary embolism, unspecified chronicity, unspecified pulmonary embolism type, unspecified whether acute cor pulmonale present ([place]) [I26.99] Patient Active Problem List   Diagnosis Date Noted   Pulmonary embolism ([place])    Community acquired pneumonia 02/21/2021   Encounter for long-term (current) use of high-risk medication 02/08/2021   Routine screening for STI (sexually transmitted infection) 02/08/2021   At risk for opportunistic infections 02/08/2021   Healthcare maintenance 08/09/2020   Immigrant  with language difficulty    Cachexia ([place])    Poor dentition    HIV infection ([place]) 12/08/2018   Microcytic anemia 12/08/2018   Alcohol use 12/05/2018   Thrush of mouth and esophagus ([place]) 03/21/2016   Anemia 08/16/2015   AIDS (acquired immune deficiency syndrome) ([place])    Gastroesophageal reflux disease without esophagitis    Gout 10/12/2014   Cholelithiasis 10/07/2014   Protein-calorie  malnutrition, severe ([place]) 10/07/2014   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 [address] [place] [address] Phone: [phone] Fax: [phone]  [name] [address] [place] [address] Phone: [phone] Fax: [phone]  [facility] [address], [place] [address] Phone: [phone] Fax: [phone]  [place] [place] [address] Phone: [phone] Fax: [phone]     Social Determinants of Health (SDOH) Interventions    Readmission Risk Interventions No flowsheet data found.

## 2021-02-26 NOTE — TOC Benefit Eligibility Note (Signed)
Patient Advocate Encounter  Completed and sent [employer] Patient [employer]itance application for [employer] for this patient who is uninsured.    Patient is approved 02/26/2021 through 02/26/2022.  BIN      [id number] GRP    [id number] ID        [id number]   Roland Earl, CPhT Pharmacy Patient Advocate Specialist [facility] Team Direct Number: [phone]  Fax: [phone]

## 2021-02-27 LAB — HSV DNA BY PCR (REFERENCE LAB)
HSV 1 DNA: POSITIVE — AB
HSV 1 DNA: POSITIVE — AB
HSV 2 DNA: NEGATIVE
HSV 2 DNA: NEGATIVE

## 2021-03-27 ENCOUNTER — Inpatient Hospital Stay (INDEPENDENT_AMBULATORY_CARE_PROVIDER_SITE_OTHER): Payer: Self-pay | Admitting: Primary Care

## 2021-09-30 ENCOUNTER — Telehealth: Payer: Self-pay

## 2021-09-30 NOTE — Telephone Encounter (Signed)
Tried calling patients mobile number, rings multiple times and the sounds busy. Home number is disconnected. Patient needs to schedule a follow up appointment or ask if patient as transferred care some where else.  ?

## 2021-10-15 ENCOUNTER — Telehealth: Payer: Self-pay

## 2021-10-15 NOTE — Telephone Encounter (Signed)
Have tried calling patient multiple times to schedule an appointment with Dr. Luciana Axe or check if patient has established care somewhere else. Phone will rings and then sound busy, no answer.  ?

## 2021-10-15 NOTE — Telephone Encounter (Signed)
-----   Message from Valarie Cones, LPN sent at 01/13/7671  3:44 PM EDT ----- ?Can we try calling him again this week. Thanks ?----- Message ----- ?From: Sheran Spine ?Sent: 09/30/2021   2:05 PM EDT ?To: Valarie Cones, LPN ? ?I tried calling patient's mobile, would ring and then sound busy. Home number is disconnected, will tr  again later. ?  ?----- Message ----- ?From: Valarie Cones, LPN ?Sent: 09/30/2021  11:34 AM EDT ?To: Sheran Spine ? ?Good morning, ?Can you reach out to patient for outreach call.  ? 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 I wanted to reach out and see if you were in care elsewhere. If not, we would be happy to see you here again. Please let me know if there is anything I can help with, we look forward to hearing from you! ? ? ?[phone]  ? ? ? ?

## 2021-11-15 NOTE — Progress Notes (Signed)
Staff has not been able to reach patient. Sent general letter in Spanish to contact our office for scheduling.  Jon Love

## 2023-07-13 ENCOUNTER — Other Ambulatory Visit: Payer: Self-pay
